# Patient Record
Sex: Male | Born: 1945
Health system: Southern US, Community
[De-identification: ages and names within clinical notes are randomized; demographics above are authoritative.]

## PROBLEM LIST (undated history)

## (undated) DIAGNOSIS — K922 Gastrointestinal hemorrhage, unspecified: Secondary | ICD-10-CM

## (undated) DIAGNOSIS — K259 Gastric ulcer, unspecified as acute or chronic, without hemorrhage or perforation: Secondary | ICD-10-CM

## (undated) DIAGNOSIS — D631 Anemia in chronic kidney disease: Secondary | ICD-10-CM

## (undated) DIAGNOSIS — R0989 Other specified symptoms and signs involving the circulatory and respiratory systems: Secondary | ICD-10-CM

## (undated) DIAGNOSIS — IMO0002 Reserved for concepts with insufficient information to code with codable children: Secondary | ICD-10-CM

## (undated) DIAGNOSIS — E538 Deficiency of other specified B group vitamins: Secondary | ICD-10-CM

## (undated) DIAGNOSIS — Q600 Renal agenesis, unilateral: Secondary | ICD-10-CM

## (undated) DIAGNOSIS — Z972 Presence of dental prosthetic device (complete) (partial): Secondary | ICD-10-CM

## (undated) DIAGNOSIS — B3781 Candidal esophagitis: Secondary | ICD-10-CM

## (undated) DIAGNOSIS — E119 Type 2 diabetes mellitus without complications: Secondary | ICD-10-CM

## (undated) DIAGNOSIS — K56699 Other intestinal obstruction unspecified as to partial versus complete obstruction: Secondary | ICD-10-CM

## (undated) DIAGNOSIS — Z7901 Long term (current) use of anticoagulants: Secondary | ICD-10-CM

## (undated) DIAGNOSIS — N189 Chronic kidney disease, unspecified: Secondary | ICD-10-CM

## (undated) DIAGNOSIS — E785 Hyperlipidemia, unspecified: Secondary | ICD-10-CM

## (undated) DIAGNOSIS — E7800 Pure hypercholesterolemia, unspecified: Secondary | ICD-10-CM

## (undated) DIAGNOSIS — N2581 Secondary hyperparathyroidism of renal origin: Secondary | ICD-10-CM

## (undated) DIAGNOSIS — Z9989 Dependence on other enabling machines and devices: Secondary | ICD-10-CM

## (undated) DIAGNOSIS — Z973 Presence of spectacles and contact lenses: Secondary | ICD-10-CM

## (undated) DIAGNOSIS — N400 Enlarged prostate without lower urinary tract symptoms: Secondary | ICD-10-CM

## (undated) DIAGNOSIS — K269 Duodenal ulcer, unspecified as acute or chronic, without hemorrhage or perforation: Secondary | ICD-10-CM

## (undated) DIAGNOSIS — G4733 Obstructive sleep apnea (adult) (pediatric): Secondary | ICD-10-CM

## (undated) DIAGNOSIS — J302 Other seasonal allergic rhinitis: Secondary | ICD-10-CM

## (undated) DIAGNOSIS — Z933 Colostomy status: Secondary | ICD-10-CM

## (undated) DIAGNOSIS — M1990 Unspecified osteoarthritis, unspecified site: Secondary | ICD-10-CM

## (undated) DIAGNOSIS — I714 Abdominal aortic aneurysm, without rupture, unspecified: Secondary | ICD-10-CM

## (undated) DIAGNOSIS — N183 Chronic kidney disease, stage 3 unspecified: Secondary | ICD-10-CM

## (undated) DIAGNOSIS — I10 Essential (primary) hypertension: Secondary | ICD-10-CM

## (undated) DIAGNOSIS — Z9049 Acquired absence of other specified parts of digestive tract: Secondary | ICD-10-CM

## (undated) DIAGNOSIS — I4821 Permanent atrial fibrillation: Secondary | ICD-10-CM

## (undated) HISTORY — DX: Pure hypercholesterolemia, unspecified: E78.00

## (undated) HISTORY — DX: Candidal esophagitis: B37.81

## (undated) HISTORY — DX: Gastrointestinal hemorrhage, unspecified: K92.2

## (undated) HISTORY — DX: Benign prostatic hyperplasia without lower urinary tract symptoms: N40.0

## (undated) HISTORY — DX: Essential (primary) hypertension: I10

## (undated) HISTORY — DX: Hyperlipidemia, unspecified: E78.5

## (undated) HISTORY — DX: Duodenal ulcer, unspecified as acute or chronic, without hemorrhage or perforation: K26.9

## (undated) HISTORY — PX: PAROTIDECTOMY: SHX2163

## (undated) HISTORY — DX: Other specified symptoms and signs involving the circulatory and respiratory systems: R09.89

## (undated) HISTORY — DX: Gastric ulcer, unspecified as acute or chronic, without hemorrhage or perforation: K25.9

## (undated) HISTORY — PX: INGUINAL HERNIA REPAIR: SUR1180

## (undated) HISTORY — DX: Other intestinal obstruction unspecified as to partial versus complete obstruction: K56.699

---

## 1999-07-12 ENCOUNTER — Ambulatory Visit (HOSPITAL_COMMUNITY): Admission: RE | Admit: 1999-07-12 | Discharge: 1999-07-12 | Payer: Self-pay | Admitting: Cardiology

## 1999-07-12 ENCOUNTER — Encounter: Payer: Self-pay | Admitting: Cardiology

## 1999-09-21 ENCOUNTER — Ambulatory Visit (HOSPITAL_BASED_OUTPATIENT_CLINIC_OR_DEPARTMENT_OTHER): Admission: RE | Admit: 1999-09-21 | Discharge: 1999-09-22 | Payer: Self-pay | Admitting: Otolaryngology

## 1999-09-21 ENCOUNTER — Encounter (INDEPENDENT_AMBULATORY_CARE_PROVIDER_SITE_OTHER): Payer: Self-pay

## 2003-06-03 ENCOUNTER — Ambulatory Visit (HOSPITAL_COMMUNITY): Admission: RE | Admit: 2003-06-03 | Discharge: 2003-06-03 | Payer: Self-pay | Admitting: *Deleted

## 2006-04-15 ENCOUNTER — Ambulatory Visit (HOSPITAL_COMMUNITY): Admission: RE | Admit: 2006-04-15 | Discharge: 2006-04-15 | Payer: Self-pay | Admitting: Internal Medicine

## 2006-12-11 ENCOUNTER — Encounter: Admission: RE | Admit: 2006-12-11 | Discharge: 2006-12-11 | Payer: Self-pay | Admitting: Internal Medicine

## 2010-12-08 NOTE — Op Note (Signed)
[place]  Patient:    Manuel [name], Manuel [name]                      MRN: [id number] Proc. Date: 09/21/99 Adm. Date:  [id number] Attending:  Rayfield Citizen CC:         Theodoro Parma. Francina Ames., M.D.                           Operative Report  PREOPERATIVE DIAGNOSIS:  Right parotid mass.  POSTOPERATIVE DIAGNOSIS:  Right parotid mass.  SURGICAL PROCEDURE:  Right parotidectomy with facial nerve preservation.  SURGEON:  Latricia Heft. Janace Hoard, M.D.  ANESTHESIA:  General endotracheal tube.  ESTIMATED BLOOD LOSS:  Approximately 25 cc.  INDICATIONS:  This is a 65 year old who has had a large mass in the right parotid that has slowly enlarged.  Has not caused him any numbness or pain.  It is very  bothersome to him, the fact that it is there, and he wants it removed.  He was informed of the risks and benefits of the procedure including bleeding, infection, facial nerve paralysis, Freys syndrome, salivary fistula, numbness of his ear and skin, scar, and risks of the anesthetic.  All questions were answered and consent was obtained.  DESCRIPTION OF PROCEDURE:  The patient was taken to the operating room and placed in the supine position.  After adequate general endotracheal tube anesthesia, was placed in the left gaze position.  Prepped and draped in the usual sterile manner. The facial nerve monitor was connected for monitoring of the nerve dissection. The incision was made in a modified Blair-type incision.  Dissection was carried down after raising a parotid flap anterior and posterior.  The dissection was taken own the cartilage to the tracheal pointer, and the tympanomeatal suture line was identified.  The tumor was very posterior and on the tail of the parotid, so the fascia was taken off the sternocleidomastoid.  The digastric muscle was dissected to identify it.  The facial nerve was finally identified in its normal anatomic  position.  The  trunk was followed and each branch was dissected and identified, and the tumor was brought up off of the inferior branches.  The mid branches of the  buccal area were rather very thready, and were followed and preserved.  The gland was divided about this level as the tumor so inferior, and this was taken off. The superior branch was not dissected, although it was identified where its location was.  The tumor was then removed.  The area was copiously irrigated.  The monitor was used to both identify the nerve with its stimulation, as well as careful dissection along the nerve.  The drain was placed inferiorly and secured with a  3-0 nylon.  The subcutaneous tissue was closed with interrupted 4-0 chromic and a running 6-0 nylon was used to close the skin.  The patient was awakened and brought to recovery in stable condition.  Counts correct. DD:  09/21/99 TD:  09/22/99 Job: [id number]

## 2010-12-08 NOTE — Op Note (Signed)
NAME:  GENERAL, WEARING                         ACCOUNT NO.:  [id number]   MEDICAL RECORD NO.:  [id number]                   PATIENT TYPE:  AMB   LOCATION:  ENDO                                 FACILITY:  [facility]   PHYSICIAN:  Waverly Ferrari, M.D.                 DATE OF BIRTH:  12/08/1945   DATE OF PROCEDURE:  06/03/2003  DATE OF DISCHARGE:                                 OPERATIVE REPORT   PROCEDURE:  Colonoscopy.   INDICATIONS:  Colon cancer screening and rectal bleeding.   ANESTHESIA:  Demerol 50, Versed 4 mg.   PROCEDURE:  The patient was mildly sedated in the left lateral decubitus  position. The Olympus videoscopic colonoscope was inserted in the rectum and  passed under direct vision to the cecum, identified by ileocecal valve and  appendiceal orifice, both of which were photographed. At this point, the  colonoscope was slowly withdrawn, taking circumferential views of the  colonic mucosa after we had also intubated the terminal ileum which also  appeared normal. The endoscope was withdrawn then all the way to the rectum,  stopping only to photograph rare diverticulum seen along the way. In the  rectum, the endoscope was placed in retroflexion to view the anal canal from  above. Endoscope was straightened and withdrawn. The patient's vital signs  and pulse oximetry remained stable. The patient tolerated the procedure well  without apparent complications.   FINDINGS:  Scattered diverticulosis of the colon, mild. Otherwise an  unremarkable examination including terminal ileum.   PLAN:  The patient will follow up with me in five years or as needed.                                               Waverly Ferrari, M.D.    GMO/MEDQ  D:  06/03/2003  T:  06/03/2003  Job:  [id number]

## 2011-04-06 ENCOUNTER — Encounter (INDEPENDENT_AMBULATORY_CARE_PROVIDER_SITE_OTHER): Payer: Self-pay | Admitting: Surgery

## 2011-04-09 ENCOUNTER — Encounter (INDEPENDENT_AMBULATORY_CARE_PROVIDER_SITE_OTHER): Payer: Self-pay | Admitting: Surgery

## 2011-04-10 ENCOUNTER — Ambulatory Visit (INDEPENDENT_AMBULATORY_CARE_PROVIDER_SITE_OTHER): Payer: Medicare Other | Admitting: Surgery

## 2011-04-10 ENCOUNTER — Encounter (INDEPENDENT_AMBULATORY_CARE_PROVIDER_SITE_OTHER): Payer: Self-pay | Admitting: Surgery

## 2011-04-10 VITALS — BP 136/78 | HR 60 | Temp 97.0°F | Resp 20 | Ht 74.5 in | Wt 232.2 lb

## 2011-04-10 DIAGNOSIS — R1032 Left lower quadrant pain: Secondary | ICD-10-CM | POA: Insufficient documentation

## 2011-04-10 LAB — BUN: BUN: 14 mg/dL (ref 6–23)

## 2011-04-10 LAB — CREATININE, SERUM: Creat: 1.23 mg/dL (ref 0.50–1.35)

## 2011-04-10 NOTE — Patient Instructions (Signed)
We will obtain an MRI of the pelvis to determine whether you have a muscle tear or possibly a very small recurrent hernia.  We will reevaluate you in two weeks.  In the meantime, use over-the-counter medications such as Ibuprofen or Aleve to help with the pain.  Avoid any heavy lifting or strenuous activity.

## 2011-04-10 NOTE — Progress Notes (Signed)
Addended by: Maryclare Bean on: 04/10/2011 01:08 PM   Modules accepted: Orders

## 2011-04-10 NOTE — Progress Notes (Signed)
Chief Complaint  Patient presents with  . Hernia    left groin pain    HPI Manuel [name] is a 65 y.o. male who is 15 years s/p left inguinal hernia repair with mesh by Dr. Rebekah Chesterfield.  Over the last few months, he has been experiencing increasing left groin pain, after some heavy lifting.  He does not feel a bulge in this area, but has significant pain with walking and prolonged standing.  He denies any obstructive symptoms.  He is referred for surgical evaluation. HPI  Past Medical History  Diagnosis Date  . Diabetes mellitus   . Hypertension   . BPH (benign prostatic hyperplasia)   . S/P colonoscopy     Past Surgical History  Procedure Date  . Hernia repair   . 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 Cystostomy     off back    Family History  Problem Relation Age of Onset  . Cancer Father     Social History History  Substance Use Topics  . Smoking status: Former Research scientist (life sciences)  . Smokeless tobacco: Not on file  . Alcohol Use: No    Allergies  Allergen Reactions  . Sulfa Antibiotics     Current Outpatient Prescriptions  Medication Sig Dispense Refill  . aspirin 81 MG tablet Take 81 mg by mouth daily.        Marland Kitchen diltiazem (CARDIZEM) 120 MG tablet Take 120 mg by mouth 4 (four) times daily.        Marland Kitchen doxazosin (CARDURA) 8 MG tablet Take 8 mg by mouth at bedtime.        Marland Kitchen lisinopril (PRINIVIL,ZESTRIL) 10 MG tablet Take 10 mg by mouth daily.        . metFORMIN (GLUMETZA) 500 MG (MOD) 24 hr tablet Take 500 mg by mouth daily with breakfast.          Review of Systems Review of Systems Reviewed with patient - essentially positive only for arthritis pains. Blood pressure 136/78, pulse 60, temperature 97 F (36.1 C), temperature source Temporal, resp. rate 20, height 6' 2.5" (1.892 m), weight 232 lb 3.2 oz (105.325 kg).  Physical Exam Physical Exam WDWN in NAD HEENT:  EOMI, sclera anicteric Neck:  No masses, no thyromegaly Lungs:  CTA bilaterally; normal  respiratory effort CV:  Regular rate and rhythm; no murmurs Abd:  +bowel sounds, soft, non-tender, no masses GU:  Bilateral descended testicles- no testicular masses.  Healed left inguinal incision.  No palpable inguinal hernia in either groin.  No obvious bulge in left groin.  The area of tenderness seems to be located laterally. Ext:  Well-perfused; no edema Skin:  Warm, dry; no sign of jaundice  Data Reviewed   Assessment    Left groin pain - possible muscle tear, but no obvious recurrent hernia on physical examination.    Plan    Rest, NSAIDS; we will obtain an MRI to evaluate for possible muscle tear vs. Small recurrent hernia.  Reevaluate in 2 weeks.       Manuel Kang K. 04/10/2011, 11:59 AM

## 2011-04-18 ENCOUNTER — Inpatient Hospital Stay (HOSPITAL_COMMUNITY): Admission: RE | Admit: 2011-04-18 | Payer: Self-pay | Source: Ambulatory Visit

## 2011-04-24 ENCOUNTER — Encounter (INDEPENDENT_AMBULATORY_CARE_PROVIDER_SITE_OTHER): Payer: Self-pay | Admitting: Surgery

## 2011-04-26 ENCOUNTER — Encounter (INDEPENDENT_AMBULATORY_CARE_PROVIDER_SITE_OTHER): Payer: Self-pay | Admitting: Surgery

## 2011-04-26 ENCOUNTER — Ambulatory Visit (INDEPENDENT_AMBULATORY_CARE_PROVIDER_SITE_OTHER): Payer: Medicare Other | Admitting: Surgery

## 2011-04-26 VITALS — BP 126/80 | HR 64 | Temp 97.4°F | Resp 14 | Ht 74.5 in | Wt 231.4 lb

## 2011-04-26 DIAGNOSIS — R1032 Left lower quadrant pain: Secondary | ICD-10-CM

## 2011-04-26 NOTE — Patient Instructions (Signed)
Limit any lifting to 20 lbs or less.  Use NSAID's, such as Aleve, Ibuprofen, or Naprosyn for the next few weeks.  No strenuous activity.  Call us if no improvement in one month.

## 2011-04-26 NOTE — Progress Notes (Signed)
Chief Complaint  Patient presents with  . Follow-up    HPI Manuel [name] is a 65 y.o. male who is 15 years s/p left inguinal hernia repair with mesh by Dr. Rebekah Chesterfield.  Over the last few months, he has been experiencing increasing left groin pain, after some heavy lifting.  He does not feel a bulge in this area, but has significant pain with walking and prolonged standing.  He denies any obstructive symptoms.  He is referred for surgical evaluation. HPI  Past Medical History  Diagnosis Date  . Diabetes mellitus   . Hypertension   . BPH (benign prostatic hyperplasia)   . S/P colonoscopy     Past Surgical History  Procedure Date  . Hernia repair   . 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 Cystostomy     off back    Family History  Problem Relation Age of Onset  . Cancer Father   . Kidney disease Paternal Aunt     Social History History  Substance Use Topics  . Smoking status: Former Research scientist (life sciences)  . Smokeless tobacco: Not on file  . Alcohol Use: No    Allergies  Allergen Reactions  . Sulfa Antibiotics     Current Outpatient Prescriptions  Medication Sig Dispense Refill  . aspirin 81 MG tablet Take 81 mg by mouth daily.        Marland Kitchen diltiazem (CARDIZEM) 120 MG tablet Take 120 mg by mouth 4 (four) times daily.        Marland Kitchen doxazosin (CARDURA) 8 MG tablet Take 8 mg by mouth at bedtime.        Marland Kitchen lisinopril (PRINIVIL,ZESTRIL) 10 MG tablet Take 10 mg by mouth daily.        . metFORMIN (GLUMETZA) 500 MG (MOD) 24 hr tablet Take 500 mg by mouth daily with breakfast.          Review of Systems Review of Systems Reviewed with patient - essentially positive only for arthritis pains. Blood pressure 126/80, pulse 64, temperature 97.4 F (36.3 C), temperature source Temporal, resp. rate 14, height 6' 2.5" (1.892 m), weight 231 lb 6.4 oz (104.962 kg).  Physical Exam Physical Exam WDWN in NAD HEENT:  EOMI, sclera anicteric Neck:  No masses, no thyromegaly Lungs:  CTA  bilaterally; normal respiratory effort CV:  Regular rate and rhythm; no murmurs Abd:  +bowel sounds, soft, non-tender, no masses GU:  Bilateral descended testicles- no testicular masses.  Healed left inguinal incision.  No palpable inguinal hernia in either groin.  No obvious bulge in left groin.  The area of tenderness seems to be located laterally. Ext:  Well-perfused; no edema Skin:  Warm, dry; no sign of jaundice  Data Reviewed   Assessment    Left groin pain - possible muscle tear, but no obvious recurrent hernia on physical examination.    Plan    Rest, NSAIDS; we will obtain an MRI to evaluate for possible muscle tear vs. Small recurrent hernia.  Reevaluate in 2 weeks.       Manuel Llerena K. 04/26/2011, 9:34 AM  Current note:  Medicare denied payment for the MRI.  The patient has been limiting his level of activity and using NSAID's, with some noticeable improvement.  He still has some tenderness laterally.  He does not have a palpable hernia on either side when standing with Valsalva maneuver.  Imp: left groin muscle strain, but no sign of hernia  Plan:  Continue NSAID's and  limiting activity.  If no improvement in one month, he will call us back and we will appeal the MRI coverage.

## 2011-10-31 ENCOUNTER — Ambulatory Visit
Admission: RE | Admit: 2011-10-31 | Discharge: 2011-10-31 | Disposition: A | Discharge status: Home / Self Care | Payer: Self-pay | Source: Ambulatory Visit | Attending: Internal Medicine | Admitting: Internal Medicine

## 2011-10-31 ENCOUNTER — Other Ambulatory Visit: Payer: Self-pay | Admitting: Internal Medicine

## 2011-10-31 DIAGNOSIS — R42 Dizziness and giddiness: Secondary | ICD-10-CM

## 2011-11-16 HISTORY — PX: CARDIOVASCULAR STRESS TEST: SHX262

## 2011-12-06 ENCOUNTER — Encounter (HOSPITAL_COMMUNITY): Payer: Self-pay | Admitting: Pharmacy Technician

## 2011-12-18 ENCOUNTER — Encounter (HOSPITAL_COMMUNITY): Payer: Self-pay | Admitting: Anesthesiology

## 2011-12-18 ENCOUNTER — Ambulatory Visit (HOSPITAL_COMMUNITY): Payer: Medicare Other | Admitting: Anesthesiology

## 2011-12-18 ENCOUNTER — Ambulatory Visit (HOSPITAL_COMMUNITY)
Admission: RE | Admit: 2011-12-18 | Discharge: 2011-12-18 | Disposition: A | Discharge status: Home / Self Care | Payer: Medicare Other | Source: Ambulatory Visit | Attending: Cardiology | Admitting: Cardiology

## 2011-12-18 ENCOUNTER — Encounter (HOSPITAL_COMMUNITY)
Admission: RE | Disposition: A | Discharge status: Home / Self Care | Payer: Self-pay | Source: Ambulatory Visit | Attending: Cardiology

## 2011-12-18 ENCOUNTER — Other Ambulatory Visit: Payer: Self-pay

## 2011-12-18 DIAGNOSIS — E119 Type 2 diabetes mellitus without complications: Secondary | ICD-10-CM | POA: Insufficient documentation

## 2011-12-18 DIAGNOSIS — I4891 Unspecified atrial fibrillation: Secondary | ICD-10-CM | POA: Insufficient documentation

## 2011-12-18 DIAGNOSIS — I10 Essential (primary) hypertension: Secondary | ICD-10-CM | POA: Insufficient documentation

## 2011-12-18 DIAGNOSIS — G4733 Obstructive sleep apnea (adult) (pediatric): Secondary | ICD-10-CM | POA: Insufficient documentation

## 2011-12-18 DIAGNOSIS — E785 Hyperlipidemia, unspecified: Secondary | ICD-10-CM | POA: Insufficient documentation

## 2011-12-18 HISTORY — PX: CARDIOVERSION: SHX1299

## 2011-12-18 LAB — GLUCOSE, CAPILLARY: Glucose-Capillary: 97 mg/dL (ref 70–99)

## 2011-12-18 SURGERY — CARDIOVERSION
Anesthesia: General | Wound class: Clean

## 2011-12-18 MED ORDER — SODIUM CHLORIDE 0.9 % IV SOLN: 250.0000 mL | INTRAVENOUS | Status: DC

## 2011-12-18 MED ORDER — LACTATED RINGERS IV SOLN
INTRAVENOUS | Status: DC | PRN
  Administered 2011-12-18: 10:00:00 via INTRAVENOUS

## 2011-12-18 MED ORDER — PROPOFOL 10 MG/ML IV EMUL
INTRAVENOUS | Status: DC | PRN
  Administered 2011-12-18: 100 mg via INTRAVENOUS

## 2011-12-18 MED ORDER — HYDROCORTISONE 1 % EX CREA
1.0000 "application " | TOPICAL_CREAM | Freq: Three times a day (TID) | CUTANEOUS | Status: DC | PRN
  Filled 2011-12-18: qty 28

## 2011-12-18 MED ORDER — SODIUM CHLORIDE 0.9 % IJ SOLN: 3.0000 mL | Freq: Two times a day (BID) | INTRAMUSCULAR | Status: DC

## 2011-12-18 MED ORDER — SODIUM CHLORIDE 0.9 % IJ SOLN: 3.0000 mL | INTRAMUSCULAR | Status: DC | PRN

## 2011-12-18 NOTE — Interval H&P Note (Signed)
History and Physical Interval Note:  12/18/2011 10:21 AM  Manuel [name]  has presented today for surgery, with the diagnosis of AFIB  The various methods of treatment have been discussed with the patient and family. After consideration of risks, benefits and other options for treatment, the patient has consented to  Procedure(s) (LRB): CARDIOVERSION (N/A) as a surgical intervention .  The patients' history has been reviewed, patient examined, no change in status, stable for surgery.  I have reviewed the patients' chart and labs.  Questions were answered to the patient's satisfaction.     Laverda Page

## 2011-12-18 NOTE — CV Procedure (Signed)
Direct current cardioversion: Indication symptomatic A. Fibrillation. Procedure: Using 100 mg of IV Propofol for achieving deep (Moderate sedation), synchronized direct current cardioversion performed. Patient was delivered with 120, 150 x 3 Joules of electricity with success to NSR. Patient tolerated the procedure well. No immediate complication noted.

## 2011-12-18 NOTE — Preoperative (Signed)
Beta Blockers   Reason not to administer Beta Blockers:Not Applicable 

## 2011-12-18 NOTE — Anesthesia Postprocedure Evaluation (Signed)
  Anesthesia Post-op Note  Patient: Manuel [name]  Procedure(s) Performed: Procedure(s) (LRB): CARDIOVERSION (N/A)  Patient Location: PACU  Anesthesia Type: general  Level of Consciousness: awake and alert   Airway and Oxygen Therapy: Patient Spontanous Breathing  Post-op Pain: mild  Post-op Assessment: Post-op Vital signs reviewed, Patient's Cardiovascular Status Stable, Respiratory Function Stable, Patent Airway and No signs of Nausea or vomiting  Post-op Vital Signs: stable  Complications: No apparent anesthesia complications

## 2011-12-18 NOTE — Discharge Instructions (Signed)
Electrical Cardioversion Cardioversion is the delivery of a jolt of electricity to change the rhythm of the heart. Sticky patches or metal paddles are placed on the chest to deliver the electricity from a special device. This is done to restore a normal rhythm. A rhythm that is too fast or not regular keeps the heart from pumping well. Compared to medicines used to change an abnormal rhythm, cardioversion is faster and works better. It is also unpleasant and may dislodge blood clots from the heart. WHEN WOULD THIS BE DONE?  In an emergency:   There is low or no blood pressure as a result of the heart rhythm.   Normal rhythm must be restored as fast as possible to protect the brain and heart from further damage.   It may save a life.   For less serious heart rhythms, such as atrial fibrillation or flutter, in which:   The heart is beating too fast or is not regular.   The heart is still able to pump enough blood, but not as well as it should.   Medicine to change the rhythm has not worked.   It is safe to wait in order to allow time for preparation.  LET YOUR CAREGIVER KNOW ABOUT:   Every medicine you are taking. It is very important to do this! Know when to take or stop taking any of them.   Any time in the past that you have felt your heart was not beating normally.  RISKS AND COMPLICATIONS   Clots may form in the chambers of the heart if it is beating too fast. These clots may be dislodged during the procedure and travel to other parts of the body.   There is risk of a stroke during and after the procedure if a clot moves. Blood thinners lower this risk.   You may have a special test of your heart (TEE) to make sure there are no clots in your heart.  BEFORE THE PROCEDURE   You may have some tests to see how well your heart is working.   You may start taking blood thinners so your blood does not clot as easily.   Other drugs may be given to help your heart work better.   PROCEDURE (SCHEDULED)  The procedure is typically done in a hospital by a heart doctor (cardiologist).   You will be told when and where to go.   You may be given some medicine through an intravenous (IV) access to reduce discomfort and make you sleepy before the procedure.   Your whole body may move when the shock is delivered. Your chest may feel sore.   You may be able to go home after a few hours. Your heart rhythm will be watched to make sure it does not change.  HOME CARE INSTRUCTIONS   Only take medicine as directed by your caregiver. Be sure you understand how and when to take your medicine.   Learn how to feel your pulse and check it often.   Limit your activity for 48 hours.   Avoid caffeine and other stimulants as directed.  SEEK MEDICAL CARE IF:   You feel like your heart is beating too fast or your pulse is not regular.   You have any questions about your medicines.   You have bleeding that will not stop.  SEEK IMMEDIATE MEDICAL CARE IF:   You are dizzy or feel faint.   It is hard to breathe or you feel short of breath.  There is a change in discomfort in your chest.   Your speech is slurred or you have trouble moving your arm or leg on one side.   You get a muscle cramp.   Your fingers or toes turn cold or blue.  MAKE SURE YOU:   Understand these instructions.   Will watch your condition.   Will get help right away if you are not doing well or get worse.  Document Released: 06/29/2002 Document Revised: 06/28/2011 Document Reviewed: 10/29/2007 [facility] Patient Information 2012 [place].

## 2011-12-18 NOTE — Anesthesia Preprocedure Evaluation (Addendum)
Anesthesia Evaluation  Patient identified by MRN, date of birth, ID band Patient awake    Reviewed: Allergy & Precautions, H&P , NPO status , Patient's Chart, lab work & pertinent test results  Airway Mallampati: II TM Distance: >3 FB Neck ROM: Full    Dental No notable dental hx. (+) Dental Advidsory Given   Pulmonary neg pulmonary ROS,  breath sounds clear to auscultation  Pulmonary exam normal       Cardiovascular hypertension, Pt. on medications + dysrhythmias Atrial Fibrillation Rhythm:Irregular Rate:Abnormal     Neuro/Psych negative neurological ROS  negative psych ROS   GI/Hepatic negative GI ROS, Neg liver ROS,   Endo/Other  Diabetes mellitus-, Type 2  Renal/GU negative Renal ROS  negative genitourinary   Musculoskeletal negative musculoskeletal ROS (+)   Abdominal   Peds negative pediatric ROS (+)  Hematology negative hematology ROS (+)   Anesthesia Other Findings   Reproductive/Obstetrics negative OB ROS                         Anesthesia Physical Anesthesia Plan  ASA: III  Anesthesia Plan: General   Post-op Pain Management:    Induction: Intravenous  Airway Management Planned: Mask  Additional Equipment:   Intra-op Plan:   Post-operative Plan:   Informed Consent: I have reviewed the patients History and Physical, chart, labs and discussed the procedure including the risks, benefits and alternatives for the proposed anesthesia with the patient or authorized representative who has indicated his/her understanding and acceptance.   Dental advisory given and Dental Advisory Given  Plan Discussed with: CRNA, Anesthesiologist and Surgeon  Anesthesia Plan Comments:        Anesthesia Quick Evaluation

## 2011-12-18 NOTE — Transfer of Care (Signed)
Immediate Anesthesia Transfer of Care Note  Patient: Manuel [name]  Procedure(s) Performed: Procedure(s) (LRB): CARDIOVERSION (N/A)  Patient Location: Short Stay  Anesthesia Type: MAC  Level of Consciousness: oriented and sedated  Airway & Oxygen Therapy: Patient Spontanous Breathing and Patient connected to nasal cannula oxygen  Post-op Assessment: Report given to PACU RN  Post vital signs: Reviewed and stable  Complications: No apparent anesthesia complications

## 2011-12-18 NOTE — Anesthesia Postprocedure Evaluation (Signed)
  Anesthesia Post-op Note  Patient: Manuel [name]  Procedure(s) Performed: Procedure(s) (LRB): CARDIOVERSION (N/A)  Patient Location: Short Stay  Anesthesia Type: MAC  Level of Consciousness: awake, alert  and oriented  Airway and Oxygen Therapy: Patient Spontanous Breathing and Patient connected to nasal cannula oxygen  Post-op Pain: none  Post-op Assessment: Post-op Vital signs reviewed, Patient's Cardiovascular Status Stable, Respiratory Function Stable, Patent Airway, No signs of Nausea or vomiting and Adequate PO intake  Post-op Vital Signs: Reviewed and stable  Complications: No apparent anesthesia complications

## 2011-12-18 NOTE — H&P (Signed)
Please see paper chart

## 2011-12-20 ENCOUNTER — Encounter (HOSPITAL_COMMUNITY): Payer: Self-pay | Admitting: Cardiology

## 2013-07-23 HISTORY — PX: CATARACT EXTRACTION W/ INTRAOCULAR LENS IMPLANT: SHX1309

## 2015-03-24 ENCOUNTER — Encounter: Payer: Self-pay | Admitting: Neurology

## 2015-03-24 ENCOUNTER — Ambulatory Visit (INDEPENDENT_AMBULATORY_CARE_PROVIDER_SITE_OTHER): Payer: Medicare HMO | Admitting: Neurology

## 2015-03-24 VITALS — BP 128/80 | HR 72 | Resp 16 | Ht 74.5 in | Wt 234.0 lb

## 2015-03-24 DIAGNOSIS — I480 Paroxysmal atrial fibrillation: Secondary | ICD-10-CM | POA: Diagnosis not present

## 2015-03-24 DIAGNOSIS — G4733 Obstructive sleep apnea (adult) (pediatric): Secondary | ICD-10-CM | POA: Diagnosis not present

## 2015-03-24 DIAGNOSIS — G4719 Other hypersomnia: Secondary | ICD-10-CM | POA: Diagnosis not present

## 2015-03-24 DIAGNOSIS — E663 Overweight: Secondary | ICD-10-CM

## 2015-03-24 DIAGNOSIS — R351 Nocturia: Secondary | ICD-10-CM | POA: Diagnosis not present

## 2015-03-24 DIAGNOSIS — M7989 Other specified soft tissue disorders: Secondary | ICD-10-CM | POA: Diagnosis not present

## 2015-03-24 NOTE — Progress Notes (Signed)
Subjective:    Patient ID: Manuel [name] is a 69 y.o. male.  HPI     Star Age, MD, PhD [facility] [address] [place], [place] [address]  Dear Ulice Dash,   I saw your patient, Manuel [name], upon your kind request in my neurologic clinic today for initial consultation of his sleep disorder, in particular, concern for underlying obstructive sleep apnea. The patient is unaccompanied today. As you know, Manuel [name] is a 69 year old right-handed gentleman with an underlying medical history of hypertension, type 2 diabetes, hyperlipidemia, left carotid bruit, arthritis, paroxysmal atrial fibrillation, status post cardioversion on 12/18/2011 and obesity, who previously was diagnosed with moderate to severe obstructive sleep apnea based on your office note. He had a sleep study according to your records on 11/29/2011. I reviewed your office note from 02/17/2015 which you kindly included. Previous test results were reviewed: Carotid Doppler study from 05/15/2012 showed minimal stenosis of the right proximal common carotid artery, minimal stenosis of the left proximal common carotid artery. Echocardiogram from 11/08/2011 showed normal left ventricular EF, moderate LVH, moderate to severe left atrial enlargement, trace MR, trace TR, no pulmonary hypertension.  Previous sleep study results are not available for my review. He had his sleep study done at the [facility]. He reports restless sleep, snoring and excessive daytime somnolence. His ESS is 13/24 today. He complaints of not sleeping well, feeling exhausted. His fatigue score is 39/63. His bedtime is around 10 PM. He has to use the bathroom 2-3 times per night on average. He denies morning headaches and a FHx of OSA. His diabetes is under good control, per patient.  He reports a bedtime of around 10 PM. He has nocturia 2-3 times per night. His rise time is between 6 and 6:15 AM. He does not drink  alcohol, he quit smoking, he does admit that he had some relapse with his smoking and most recently quit 3 months ago. He drinks caffeine in the form of coffee, 2-3 per day. He denies any significant restless leg symptoms. His Epworth sleepiness score is 13 out of 24, his fatigue score is 39 out of 63. He does not wake up rested.  His Past Medical History Is Significant For: Past Medical History  Diagnosis Date  . Diabetes mellitus   . Hypertension   . BPH (benign prostatic hyperplasia)   . S/P colonoscopy   . A-fib   . Left carotid bruit   . OSA (obstructive sleep apnea)   . Dizziness and giddiness   . Dyslipidemia     His Past Surgical History Is Significant For: Past Surgical History  Procedure Laterality Date  . Hernia repair    . 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 Cystostomy      off back  . Cardioversion  12/18/2011    Procedure: CARDIOVERSION;  Surgeon: Laverda Page, MD;  Location: [facility] OR;  Service: Cardiovascular;  Laterality: N/A;    His Family History Is Significant For: Family History  Problem Relation Age of Onset  . Cancer Father   . Alzheimer's disease Father   . Kidney disease Paternal Aunt   . Cancer Mother     His Social History Is Significant For: Social History   Social History  . Marital Status: Married    Spouse Name: N/A  . Number of Children: 1  . Years of Education: N/A   Occupational History  .  [employer]   Social History Main Topics  . Smoking status: Former Research scientist (life sciences)  . Smokeless tobacco: Not on file     Comment: 12/2014  . Alcohol Use: No  . Drug Use: No  . Sexual Activity: Not on file   Other Topics Concern  . Not on file   Social History Narrative   Drinks 2-3 caffeine drinks a day     His Allergies Are:  Allergies  Allergen Reactions  . Sulfa Antibiotics Other (See Comments)    Headaches   :   His Current Medications Are:  Outpatient Encounter Prescriptions as of 03/24/2015   Medication Sig  . diltiazem (CARDIZEM) 120 MG tablet Take 120 mg by mouth daily.   Marland Kitchen doxazosin (CARDURA) 8 MG tablet Take 8 mg by mouth daily.  Marland Kitchen edoxaban (SAVAYSA) 60 MG TABS tablet Take by mouth daily.  Marland Kitchen glimepiride (AMARYL) 1 MG tablet Take 1 mg by mouth daily with breakfast.  . lisinopril (PRINIVIL,ZESTRIL) 10 MG tablet Take 10 mg by mouth daily.   Marland Kitchen losartan (COZAAR) 25 MG tablet Take 25 mg by mouth daily.  . metFORMIN (GLUMETZA) 500 MG (MOD) 24 hr tablet Take 500 mg by mouth 2 (two) times daily with a meal.   . pravastatin (PRAVACHOL) 10 MG tablet Take 20 mg by mouth daily.   . [DISCONTINUED] CARTIA XT 120 MG 24 hr capsule TK 1 C PO BID  . [DISCONTINUED] finasteride (PROSCAR) 5 MG tablet Take 5 mg by mouth daily.   No facility-administered encounter medications on file as of 03/24/2015.  :  Review of Systems:  Out of a complete 14 point review of systems, all are reviewed and negative with the exception of these symptoms as listed below:   Review of Systems  Constitutional: Positive for fatigue.  HENT: Positive for hearing loss.   Eyes:       Blurred vision   Gastrointestinal: Positive for diarrhea.  Genitourinary: Positive for difficulty urinating.       Impotence   Musculoskeletal:       Cramps  Neurological: Positive for dizziness.       No trouble falling asleep, trouble staying asleep, snoring, sometimes takes naps, wakes up in the morning feeling tired, no energy during day, states that he paces his activities during the day.   Psychiatric/Behavioral:       Not enough sleep     Objective:  Neurologic Exam  Physical Exam Physical Examination:   Filed Vitals:   03/24/15 0932  BP: 128/80  Pulse: 72  Resp: 16    General Examination: The patient is a very pleasant 69 y.o. male in no acute distress. He appears well-developed and well-nourished and well groomed.   HEENT: Normocephalic, atraumatic, pupils are equal, round and reactive to light and accommodation.  Funduscopic exam is normal with sharp disc margins noted. Extraocular tracking is good without limitation to gaze excursion or nystagmus noted. Normal smooth pursuit is noted. Hearing is grossly intact. Face is symmetric with normal facial animation and normal facial sensation. Speech is clear with no dysarthria noted. There is no hypophonia. There is no lip, neck/head, jaw or voice tremor. Neck is supple with full range of passive and active motion. There are no carotid bruits on auscultation. Oropharynx exam reveals: mild mouth dryness, adequate dental hygiene and moderate airway crowding, due to thick soft palate, redundant soft palate, large uvula, larger tongue and tonsils in place. Mallampati is class III. Tongue protrudes centrally and  palate elevates symmetrically. Tonsils are 1+ in size. Neck circumference is 17 inches.   Chest: Clear to auscultation without wheezing, rhonchi or crackles noted.  Heart: S1+S2+0, irregularly irregular without murmurs, rubs or gallops noted.   Abdomen: Soft, non-tender and non-distended with normal bowel sounds appreciated on auscultation.  Extremities: There is trace pitting edema in the distal lower extremities bilaterally. Pedal pulses are intact.  Skin: Warm and dry without trophic changes noted. There are no varicose veins.  Musculoskeletal: exam reveals no obvious joint deformities, tenderness or joint swelling or erythema.   Neurologically:  Mental status: The patient is awake, alert and oriented in all 4 spheres. His immediate and remote memory, attention, language skills and fund of knowledge are appropriate. There is no evidence of aphasia, agnosia, apraxia or anomia. Speech is clear with normal prosody and enunciation. Thought process is linear. Mood is normal and affect is normal.  Cranial nerves II - XII are as described above under HEENT exam. In addition: shoulder shrug is normal with equal shoulder height noted. Motor exam: Normal bulk,  strength and tone is noted. There is no drift, tremor or rebound. Romberg is negative. Reflexes are 2+ throughout. Babinski: Toes are flexor bilaterally. Fine motor skills and coordination: intact with normal finger taps, normal hand movements, normal rapid alternating patting, normal foot taps and normal foot agility.  Cerebellar testing: No dysmetria or intention tremor on finger to nose testing. Heel to shin is unremarkable bilaterally. There is no truncal or gait ataxia.  Sensory exam: intact to light touch, pinprick, vibration, temperature sense in the upper and lower extremities.  Gait, station and balance: He stands easily. No veering to one side is noted. No leaning to one side is noted. Posture is age-appropriate and stance is narrow based. Gait shows normal stride length and normal pace. No problems turning are noted. He turns en bloc. Tandem walk is slightly difficult for him.   Assessment and Plan:   In summary, ESPN ZEMAN is a very pleasant 69 y.o.-year old male with a history and physical exam concerning for obstructive sleep apnea (OSA). I had a long chat with the patient about my findings and the diagnosis of OSA, its prognosis and treatment options. We talked about medical treatments, surgical interventions and non-pharmacological approaches. I explained in particular the risks and ramifications of untreated moderate to severe OSA, especially with respect to developing cardiovascular disease down the Road, including congestive heart failure, difficult to treat hypertension, cardiac arrhythmias, or stroke. Even type 2 diabetes has, in part, been linked to untreated OSA. Symptoms of untreated OSA include daytime sleepiness, memory problems, mood irritability and mood disorder such as depression and anxiety, lack of energy, as well as recurrent headaches, especially morning headaches. We talked about smoking cessation and trying to maintain a healthy lifestyle in general, as well as the  importance of weight control. I encouraged the patient to eat healthy, exercise daily and keep well hydrated, to keep a scheduled bedtime and wake time routine, to not skip any meals and eat healthy snacks in between meals. I advised the patient not to drive when feeling sleepy. I recommended the following at this time: sleep study with potential positive airway pressure titration. (We will score hypopneas at 4% and split the sleep study into diagnostic and treatment portion, if the estimated. 2 hour AHI is >15/h).   I explained the sleep test procedure to the patient and also outlined possible surgical and non-surgical treatment options of OSA, including the  use of a custom-made dental device (which would require a referral to a specialist dentist or oral surgeon), upper airway surgical options, such as pillar implants, radiofrequency surgery, tongue base surgery, and UPPP (which would involve a referral to an ENT surgeon). Rarely, jaw surgery such as mandibular advancement may be considered.  I also explained the CPAP treatment option to the patient, who indicated that he would be willing to try CPAP if the need arises. I explained the importance of being compliant with PAP treatment, not only for insurance purposes but primarily to improve His symptoms, and for the patient's long term health benefit, including to reduce His cardiovascular risks. I answered all his questions today and the patient was in agreement. I would like to see him back after the sleep study is completed and encouraged him to call with any interim questions, concerns, problems or updates.   Thank you very much for allowing me to participate in the care of this nice patient. If I can be of any further assistance to you please do not hesitate to call me at [phone].  Sincerely,   Star Age, MD, PhD

## 2015-03-24 NOTE — Patient Instructions (Signed)

## 2015-04-14 ENCOUNTER — Telehealth: Payer: Self-pay | Admitting: Neurology

## 2015-04-14 NOTE — Telephone Encounter (Signed)
Patient called to advise he received a call from his insurance company regarding sleep study. Was advised they won't cover this because there are no symptoms or medical conditions to support a sleep study. They suggested a possible home sleep study. Please call patient regarding this. [phone].

## 2015-04-27 DIAGNOSIS — I10 Essential (primary) hypertension: Secondary | ICD-10-CM | POA: Diagnosis not present

## 2015-04-27 DIAGNOSIS — E119 Type 2 diabetes mellitus without complications: Secondary | ICD-10-CM | POA: Diagnosis not present

## 2015-04-27 DIAGNOSIS — E118 Type 2 diabetes mellitus with unspecified complications: Secondary | ICD-10-CM | POA: Diagnosis not present

## 2015-05-02 DIAGNOSIS — I480 Paroxysmal atrial fibrillation: Secondary | ICD-10-CM | POA: Diagnosis not present

## 2015-05-02 DIAGNOSIS — I10 Essential (primary) hypertension: Secondary | ICD-10-CM | POA: Diagnosis not present

## 2015-05-02 DIAGNOSIS — E119 Type 2 diabetes mellitus without complications: Secondary | ICD-10-CM | POA: Diagnosis not present

## 2015-05-02 DIAGNOSIS — E7800 Pure hypercholesterolemia, unspecified: Secondary | ICD-10-CM | POA: Diagnosis not present

## 2015-05-06 ENCOUNTER — Telehealth: Payer: Self-pay | Admitting: Neurology

## 2015-05-06 DIAGNOSIS — G4733 Obstructive sleep apnea (adult) (pediatric): Secondary | ICD-10-CM

## 2015-05-06 DIAGNOSIS — G4719 Other hypersomnia: Secondary | ICD-10-CM

## 2015-05-06 DIAGNOSIS — E663 Overweight: Secondary | ICD-10-CM

## 2015-05-06 DIAGNOSIS — I480 Paroxysmal atrial fibrillation: Secondary | ICD-10-CM

## 2015-05-06 DIAGNOSIS — R351 Nocturia: Secondary | ICD-10-CM

## 2015-05-06 NOTE — Telephone Encounter (Signed)
[employer] denied split sleep test.  Can I get an order for HST?

## 2015-05-09 NOTE — Telephone Encounter (Signed)
Home sleep test placed.

## 2015-05-12 ENCOUNTER — Encounter (INDEPENDENT_AMBULATORY_CARE_PROVIDER_SITE_OTHER): Payer: Medicare HMO | Admitting: Neurology

## 2015-05-12 DIAGNOSIS — G4733 Obstructive sleep apnea (adult) (pediatric): Secondary | ICD-10-CM

## 2015-05-12 DIAGNOSIS — E663 Overweight: Secondary | ICD-10-CM

## 2015-05-12 DIAGNOSIS — R351 Nocturia: Secondary | ICD-10-CM

## 2015-05-12 DIAGNOSIS — I480 Paroxysmal atrial fibrillation: Secondary | ICD-10-CM

## 2015-05-12 DIAGNOSIS — G4719 Other hypersomnia: Secondary | ICD-10-CM

## 2015-05-20 ENCOUNTER — Telehealth: Payer: Self-pay | Admitting: Neurology

## 2015-05-20 DIAGNOSIS — G4733 Obstructive sleep apnea (adult) (pediatric): Secondary | ICD-10-CM

## 2015-05-20 DIAGNOSIS — G4734 Idiopathic sleep related nonobstructive alveolar hypoventilation: Secondary | ICD-10-CM

## 2015-05-20 NOTE — Telephone Encounter (Signed)
Patient referred by Dr. Einar Gip, seen by me on 03/24/15, HST on 05/12/15:  Please call and notify the patient that the recent home sleep test did suggest the diagnosis of severe obstructive sleep apnea and that I recommend treatment for this in the form of CPAP. I will request an overnight sleep study for proper titration and mask fitting. Please explain to patient and arrange for a CPAP titration study. I have placed an order in the chart. Thanks, and please route to [facility] for scheduling.   Star Age, MD, PhD [facility])

## 2015-05-23 NOTE — Telephone Encounter (Signed)
Left message to call back for sleep study results.

## 2015-05-24 NOTE — Telephone Encounter (Signed)
I left message on vm. Gave results and recommendations per patient request. I advised that Dawn will call him to schedule titration study. Left call back number for further questions.

## 2015-05-24 NOTE — Telephone Encounter (Signed)
Pt called returning Diana's call. Pt sts he is at a convention, phone will be cut down. Can leave VM. If he does not hear back he will call on Thursday when he gets back in town.

## 2015-05-26 NOTE — Telephone Encounter (Signed)
Patient called back to clarify results. He would like to proceed with titration study. I advised him that Arrie Aran will call to schedule.

## 2015-06-13 DIAGNOSIS — Z23 Encounter for immunization: Secondary | ICD-10-CM | POA: Diagnosis not present

## 2015-07-03 ENCOUNTER — Ambulatory Visit (INDEPENDENT_AMBULATORY_CARE_PROVIDER_SITE_OTHER): Payer: Medicare HMO | Admitting: Neurology

## 2015-07-03 DIAGNOSIS — G4733 Obstructive sleep apnea (adult) (pediatric): Secondary | ICD-10-CM | POA: Diagnosis not present

## 2015-07-03 DIAGNOSIS — G4734 Idiopathic sleep related nonobstructive alveolar hypoventilation: Secondary | ICD-10-CM

## 2015-07-03 DIAGNOSIS — G4761 Periodic limb movement disorder: Secondary | ICD-10-CM

## 2015-07-03 DIAGNOSIS — R9431 Abnormal electrocardiogram [ECG] [EKG]: Secondary | ICD-10-CM

## 2015-07-03 DIAGNOSIS — G479 Sleep disorder, unspecified: Secondary | ICD-10-CM

## 2015-07-04 NOTE — Sleep Study (Signed)
Please see the scanned sleep study interpretation located in the procedure tab in the chart view section.

## 2015-07-05 ENCOUNTER — Telehealth: Payer: Self-pay | Admitting: Neurology

## 2015-07-05 DIAGNOSIS — G4733 Obstructive sleep apnea (adult) (pediatric): Secondary | ICD-10-CM

## 2015-07-05 NOTE — Telephone Encounter (Signed)
I spoke to patient and he is aware of results and recommendation. He would like to proceed with treatment. I will refer to [place]. I will send report to PCP and Dr. Einar Gip. I will send the patient a letter reminding him to make appt and stress importance of compliance.

## 2015-07-05 NOTE — Telephone Encounter (Signed)
Left message on cell phone to call us back and left message with wife at home to have him call us back.

## 2015-07-05 NOTE — Telephone Encounter (Signed)
Patient referred by Dr. Einar Gip, seen by me on 03/24/15, HST on 05/12/15, CPAP study on 07/03/15, ins. [employer]: Please call and inform patient that I have entered an order for treatment with positive airway pressure (PAP) treatment of obstructive sleep apnea (OSA). He did fairly well during the latest sleep study with CPAP. We will, therefore, arrange for a machine for home use through a DME (durable medical equipment) company of His choice; and I will see the patient back in follow-up in about 8-10 weeks. Please also explain to the patient that I will be looking out for compliance data, which can be downloaded from the machine (stored on an SD card, that is inserted in the machine) or via remote access through a modem, that is built into the machine. At the time of the followup appointment we will discuss sleep study results and how it is going with PAP treatment at home. Please advise patient to bring His machine at the time of the first FU visit, even though this is cumbersome. Bringing the machine for every visit after that will likely not be needed, but often helps for the first visit to troubleshoot if needed. Please re-enforce the importance of compliance with treatment and the need for [place] to monitor compliance data - often an insurance requirement and actually good feedback for the patient as far as how they are doing.  Also remind patient, that any interim PAP machine or mask issues should be first addressed with the DME company, as they can often help better with technical and mask fit issues. Please ask if patient has a preference regarding DME company.  Please also make sure, the patient has a follow-up appointment with me in about 8-10 weeks from the setup date, thanks.  Once you have spoken to the patient - and faxed/routed report to PCP and referring MD (if other than PCP), you can close this encounter, thanks,   Star Age, MD, PhD [facility]   Please find out if  the patient has seen a lung doctor before, if not, I would like to make a referral as his oxygen levels were low, even during wakefulness.

## 2015-07-05 NOTE — Telephone Encounter (Signed)
Pt returned Diana's call

## 2015-07-24 DIAGNOSIS — I4821 Permanent atrial fibrillation: Secondary | ICD-10-CM

## 2015-07-24 HISTORY — DX: Permanent atrial fibrillation: I48.21

## 2015-07-29 DIAGNOSIS — G4733 Obstructive sleep apnea (adult) (pediatric): Secondary | ICD-10-CM | POA: Diagnosis not present

## 2015-08-23 DIAGNOSIS — M719 Bursopathy, unspecified: Secondary | ICD-10-CM | POA: Diagnosis not present

## 2015-08-23 DIAGNOSIS — M25561 Pain in right knee: Secondary | ICD-10-CM | POA: Diagnosis not present

## 2015-08-23 DIAGNOSIS — M179 Osteoarthritis of knee, unspecified: Secondary | ICD-10-CM | POA: Diagnosis not present

## 2015-08-25 DIAGNOSIS — E119 Type 2 diabetes mellitus without complications: Secondary | ICD-10-CM | POA: Diagnosis not present

## 2015-08-25 DIAGNOSIS — I480 Paroxysmal atrial fibrillation: Secondary | ICD-10-CM | POA: Diagnosis not present

## 2015-08-25 DIAGNOSIS — E7800 Pure hypercholesterolemia, unspecified: Secondary | ICD-10-CM | POA: Diagnosis not present

## 2015-08-25 DIAGNOSIS — G4733 Obstructive sleep apnea (adult) (pediatric): Secondary | ICD-10-CM | POA: Diagnosis not present

## 2015-08-29 DIAGNOSIS — G4733 Obstructive sleep apnea (adult) (pediatric): Secondary | ICD-10-CM | POA: Diagnosis not present

## 2015-08-30 DIAGNOSIS — E119 Type 2 diabetes mellitus without complications: Secondary | ICD-10-CM | POA: Diagnosis not present

## 2015-08-30 DIAGNOSIS — Z961 Presence of intraocular lens: Secondary | ICD-10-CM | POA: Diagnosis not present

## 2015-08-30 DIAGNOSIS — H2512 Age-related nuclear cataract, left eye: Secondary | ICD-10-CM | POA: Diagnosis not present

## 2015-08-30 DIAGNOSIS — Z7984 Long term (current) use of oral hypoglycemic drugs: Secondary | ICD-10-CM | POA: Diagnosis not present

## 2015-09-01 DIAGNOSIS — I10 Essential (primary) hypertension: Secondary | ICD-10-CM | POA: Diagnosis not present

## 2015-09-01 DIAGNOSIS — E119 Type 2 diabetes mellitus without complications: Secondary | ICD-10-CM | POA: Diagnosis not present

## 2015-09-07 ENCOUNTER — Encounter: Payer: Self-pay | Admitting: Neurology

## 2015-09-07 ENCOUNTER — Ambulatory Visit (INDEPENDENT_AMBULATORY_CARE_PROVIDER_SITE_OTHER): Payer: Medicare HMO | Admitting: Neurology

## 2015-09-07 ENCOUNTER — Ambulatory Visit: Payer: Medicare HMO | Admitting: Neurology

## 2015-09-07 VITALS — BP 142/78 | HR 80 | Resp 20 | Ht 74.5 in | Wt 238.0 lb

## 2015-09-07 DIAGNOSIS — G4734 Idiopathic sleep related nonobstructive alveolar hypoventilation: Secondary | ICD-10-CM

## 2015-09-07 DIAGNOSIS — G4733 Obstructive sleep apnea (adult) (pediatric): Secondary | ICD-10-CM | POA: Diagnosis not present

## 2015-09-07 DIAGNOSIS — Z9989 Dependence on other enabling machines and devices: Secondary | ICD-10-CM

## 2015-09-07 NOTE — Progress Notes (Signed)
Subjective:    [name] ID: Manuel [name] is a 70 y.o. male.  HPI     Interim history:   Manuel [name] is a 70 year old right-handed gentleman with an underlying medical history of hypertension, type 2 diabetes, hyperlipidemia, left carotid bruit, arthritis, paroxysmal atrial fibrillation, status post cardioversion on 12/18/2011 and obesity, who presents for follow-up consultation of his OSA, after his recent sleep studies. Manuel [name] is unaccompanied today. I first met him on 03/24/2015 at Manuel request of his cardiologist, Dr. Einar Gip, at which time Manuel [name] reported daytime somnolence, nonrestorative sleep and a prior diagnosis of moderate to severe OSA based on his records. I invited him back for sleep study. He had a home sleep test on 05/12/2015 which indicated severe sleep disordered breathing with an AHI of 30.8 per hour, oxyhemoglobin desaturation nadir was 74%, time below 90% saturation was over 5 hours, time below 88% saturation was over 4 hours. Based on his history and test results I invited him back for a full night CPAP titration study. He had this on 07/03/2015. Sleep efficiency was 52.6%, latency to sleep 17.5 minutes and wake after sleep onset was 177.5 minutes with several longer periods of wakefulness noted. His arousal index was mildly elevated. He had an increased percentage of stage II sleep, absence of slow-wave sleep and a markedly decreased percentage of REM sleep at 5.8% with a prolonged REM latency of 162.5 minutes. He had mild PLMS with an index of 15.2 per hour, resulting in no significant arousals. Average oxygen saturation was 85% only, oxyhemoglobin desaturation nadir was 80%. Time below 90% saturation was 3 hours and 26 minutes. CPAP was titrated from 5 cm to 7 cm. His oxygen saturations were in Manuel high 80s and low 90s even during wakefulness and he achieved very little sleep on Manuel final pressure. Based on his test results I prescribed CPAP therapy at 8 cm for home use  and also recommended evaluation with a pulmonologist.  Today, 09/07/2015: I reviewed his CPAP compliance data from 08/07/2015 through 09/05/2015 which is a total of 30 days during which time he used his machine 30 days with percent used days greater than 4 hours at 97%, indicating excellent compliance with an average usage of 7 hours and 18 minutes, residual AHI at 2.9 per hour, leak at Manuel 95th percentile at 56.7 L/m on a pressure of 8 cm with EPR of 3.  Today, 09/07/2015: He reports feeling better, better rested, less sleepy during Manuel day, less nocturia, more energy and in fact, looks forward to going to sleep. No history of lung d/s, no pneumonias, stopped smoking in 2014.   Previously: 03/24/2015: He was previously was diagnosed with moderate to severe obstructive sleep apnea based on your office note. He had a sleep study according to your records on 11/29/2011. I reviewed your office note from 02/17/2015 which you kindly included. Previous test results were reviewed: Carotid Doppler study from 05/15/2012 showed minimal stenosis of Manuel right proximal common carotid artery, minimal stenosis of Manuel left proximal common carotid artery. Echocardiogram from 11/08/2011 showed normal left ventricular EF, moderate LVH, moderate to severe left atrial enlargement, trace MR, trace TR, no pulmonary hypertension.   Previous sleep study results are not available for my review. He had his sleep [facility] done at [facility]. He reports restless sleep, snoring and excessive daytime somnolence. His ESS is 13/24 today. He complaints of not sleeping well, feeling exhausted. His fatigue score is 39/63. His bedtime is around  10 PM. He has to use Manuel bathroom 2-3 times per night on average. He denies morning headaches and a FHx of OSA. His diabetes is under good control, per [name].  He reports a bedtime of around 10 PM. He has nocturia 2-3 times per night. His rise time is between 6 and 6:15 AM. He does not  drink alcohol, he quit smoking, he does admit that he had some relapse with his smoking and most recently quit 3 months ago. He drinks caffeine in Manuel form of coffee, 2-3 per day. He denies any significant restless leg symptoms. His Epworth sleepiness score is 13 out of 24, his fatigue score is 39 out of 63. He does not wake up rested.   His Past Medical History Is Significant For: Past Medical History  Diagnosis Date  . Diabetes mellitus   . Hypertension   . BPH (benign prostatic hyperplasia)   . S/P colonoscopy   . A-fib ([place])   . Left carotid bruit   . OSA (obstructive sleep apnea)   . Dizziness and giddiness   . Dyslipidemia     His Past Surgical History Is Significant For: Past Surgical History  Procedure Laterality Date  . Hernia repair    . 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 Cystostomy      off back  . Cardioversion  12/18/2011    Procedure: CARDIOVERSION;  Surgeon: Laverda Page, MD;  Location: [facility] OR;  Service: Cardiovascular;  Laterality: N/A;    His Family History Is Significant For: Family History  Problem Relation Age of Onset  . Cancer Father   . Alzheimer's disease Father   . Kidney disease Paternal Aunt   . Cancer Mother     His Social History Is Significant For: Social History   Social History  . Marital Status: Married    Spouse Name: N/A  . Number of Children: 1  . Years of Education: N/A   Occupational History  . [employer]   Social History Main Topics  . Smoking status: Former Research scientist (life sciences)  . Smokeless tobacco: None     Comment: 12/2014  . Alcohol Use: No  . Drug Use: No  . Sexual Activity: Not Asked   Other Topics Concern  . None   Social History Narrative   Drinks 2-3 caffeine drinks a day     His Allergies Are:  Allergies  Allergen Reactions  . Sulfa Antibiotics Other (See Comments)    Headaches   :   His Current Medications Are:  Outpatient Encounter Prescriptions as of 09/07/2015   Medication Sig  . diltiazem (CARDIZEM) 120 MG tablet Take 120 mg by mouth daily.   Marland Kitchen doxazosin (CARDURA) 8 MG tablet Take 8 mg by mouth daily.  Marland Kitchen edoxaban (SAVAYSA) 60 MG TABS tablet Take by mouth daily.  Marland Kitchen ELIQUIS 5 MG TABS tablet TK 1 T PO BID AFTER FOOD  . glimepiride (AMARYL) 1 MG tablet Take 1 mg by mouth daily with breakfast.  . lisinopril (PRINIVIL,ZESTRIL) 10 MG tablet Take 10 mg by mouth daily.   Marland Kitchen losartan (COZAAR) 25 MG tablet Take 25 mg by mouth daily.  . metFORMIN (GLUMETZA) 500 MG (MOD) 24 hr tablet Take 500 mg by mouth 2 (two) times daily with a meal.   . pravastatin (PRAVACHOL) 10 MG tablet Take 20 mg by mouth daily.    No facility-administered encounter medications on file as of 09/07/2015.  :  Review of Systems:  Out of a complete 14 point review of systems, all are reviewed and negative with Manuel exception of these symptoms as listed below:   Review of Systems  Neurological:       [name] feels like he is doing well with CPAP. No new concerns.     Objective:  Neurologic Exam  Physical Exam Physical Examination:   Filed Vitals:   09/07/15 1402  BP: 142/78  Pulse: 80  Resp: 20    General Examination: Manuel [name] is a very pleasant 70 y.o. male in no acute distress. He appears well-developed and well-nourished and well groomed. He is in good spirits today.  HEENT: Normocephalic, atraumatic, pupils are equal, round and reactive to light and accommodation. Extraocular tracking is good without limitation to gaze excursion or nystagmus noted. Normal smooth pursuit is noted. Hearing is grossly intact. Face is symmetric with normal facial animation and normal facial sensation. Speech is clear with no dysarthria noted. There is no hypophonia. There is no lip, neck/head, jaw or voice tremor. Neck is supple with full range of passive and active motion. There are no carotid bruits on auscultation. Oropharynx exam reveals: mild mouth dryness, adequate dental hygiene and  moderate airway crowding, due to thick soft palate, redundant soft palate, large uvula, larger tongue and tonsils in place. Mallampati is class III. Tongue protrudes centrally and palate elevates symmetrically. Tonsils are 1+ in size.   Chest: Clear to auscultation without wheezing, rhonchi or crackles noted.  Heart: S1+S2+0, irregularly irregular without murmurs, rubs or gallops noted.   Abdomen: Soft, non-tender and non-distended with normal bowel sounds appreciated on auscultation.  Extremities: There is trace pitting edema in Manuel distal lower extremities bilaterally, unchanged. Pedal pulses are intact.  Skin: Warm and dry without trophic changes noted. There are no varicose veins.  Musculoskeletal: exam reveals no obvious joint deformities, tenderness or joint swelling or erythema.   Neurologically:  Mental status: Manuel [name] is awake, alert and oriented in all 4 spheres. His immediate and remote memory, attention, language skills and fund of knowledge are appropriate. There is no evidence of aphasia, agnosia, apraxia or anomia. Speech is clear with normal prosody and enunciation. Thought process is linear. Mood is normal and affect is normal.  Cranial nerves II - XII are as described above under HEENT exam. In addition: shoulder shrug is normal with equal shoulder height noted. Motor exam: Normal bulk, strength and tone is noted. There is no drift, tremor or rebound. Romberg is negative. Reflexes are 2+ throughout. Fine motor skills and coordination: intact with normal finger taps, normal hand movements, normal rapid alternating patting, normal foot taps and normal foot agility.  Cerebellar testing: No dysmetria or intention tremor on finger to nose testing. Heel to shin is unremarkable bilaterally. There is no truncal or gait ataxia.  Sensory exam: intact to light touch in Manuel upper and lower extremities.  Gait, station and balance: He stands easily. No veering to one side is noted. No  leaning to one side is noted. Posture is age-appropriate and stance is narrow based. Gait shows normal stride length and normal pace. No problems turning are noted. He turns en bloc. Tandem walk is slightly difficult for him.   Assessment and Plan:   In summary, Manuel [name] is a very pleasant 70 year old male with an underlying medical history of hypertension, type 2 diabetes, hyperlipidemia, left carotid bruit, arthritis, paroxysmal atrial fibrillation, status post cardioversion on 12/18/2011 and obesity, who presents for follow-up  consultation of his OSA, after his recent sleep studies. We talked about his home sleep test from October 2016 as well as his CPAP titration test results from 07/03/2015 today. We also reviewed his compliance data. He is fully compliant with treatment and is commended for this. In addition, he reports improvement of his daytime sleepiness, improved daytime energy level, improved nocturia and sleep consolidation. He is pleased with how he's doing. He had some lower baseline oxygen saturations during Manuel CPAP titration study and also during Manuel home sleep test. I would like to proceed with an overnight pulse oximetry test while he is on his CPAP at night. We will get in touch with his DME company for this. In addition, we need to work on lowering his air leak from Manuel mask. This may be addressed by his DME company as well. His physical exam is stable. I would like to see him back in 6 months and in Manuel interim we will be in touch regarding his pulse oximetry test results. His history does not suggest any underlying lung disease, and he stopped smoking about 2 or 3 years ago.   I again explained in particular Manuel risks and ramifications of untreated moderate to severe OSA, especially with respect to developing cardiovascular disease down Manuel [address], including congestive heart failure, difficult to [name] hypertension, cardiac arrhythmias, or stroke. Even type 2 diabetes has, in part,  been linked to untreated OSA. Symptoms of untreated OSA include daytime sleepiness, memory problems, mood irritability and mood disorder such as depression and anxiety, lack of energy, as well as recurrent headaches, especially morning headaches. We talked about smoking cessation and trying to maintain a healthy lifestyle in general, as well as Manuel importance of weight control. I encouraged Manuel [name] to eat healthy, exercise daily and keep well hydrated, to keep a scheduled bedtime and wake time routine, to not skip any meals and eat healthy snacks in between meals. I advised Manuel [name] not to drive when feeling sleepy. I asked him to continue to use CPAP regularly. I explained Manuel importance of being compliant with PAP treatment, not only for insurance purposes but primarily to improve His symptoms, and for Manuel [name]'s long term health benefit, including to reduce His cardiovascular risks. I answered all his questions today and Manuel [name] was in agreement.  I spent 25 minutes in total face-to-face time with Manuel [name], more than 50% of which was spent in counseling and coordination of care, reviewing test results, reviewing medication and discussing or reviewing Manuel diagnosis of OSA, its prognosis and treatment options.

## 2015-09-07 NOTE — Patient Instructions (Signed)
Please continue using your CPAP regularly. While your insurance requires that you use CPAP at least 4 hours each night on 70% of the nights, I recommend, that you not skip any nights and use it throughout the night if you can. Getting used to CPAP and staying with the treatment long term does take time and patience and discipline. Untreated obstructive sleep apnea when it is moderate to severe can have an adverse impact on cardiovascular health and raise her risk for heart disease, arrhythmias, hypertension, congestive heart failure, stroke and diabetes. Untreated obstructive sleep apnea causes sleep disruption, nonrestorative sleep, and sleep deprivation. This can have an impact on your day to day functioning and cause daytime sleepiness and impairment of cognitive function, memory loss, mood disturbance, and problems focussing. Using CPAP regularly can improve these symptoms.  Keep up the good work! I will see you back in 6 months for sleep apnea check up, and if you continue to do well on CPAP I will see you once a year thereafter.   We will set up an oxygen test at home for you for one night, while you are on your CPAP. [employer], your DME company will be in touch for this.

## 2015-09-08 DIAGNOSIS — E785 Hyperlipidemia, unspecified: Secondary | ICD-10-CM | POA: Diagnosis not present

## 2015-09-08 DIAGNOSIS — Z136 Encounter for screening for cardiovascular disorders: Secondary | ICD-10-CM | POA: Diagnosis not present

## 2015-09-08 DIAGNOSIS — Z1389 Encounter for screening for other disorder: Secondary | ICD-10-CM | POA: Diagnosis not present

## 2015-09-08 DIAGNOSIS — I10 Essential (primary) hypertension: Secondary | ICD-10-CM | POA: Diagnosis not present

## 2015-09-08 DIAGNOSIS — G4733 Obstructive sleep apnea (adult) (pediatric): Secondary | ICD-10-CM | POA: Diagnosis not present

## 2015-09-08 DIAGNOSIS — E119 Type 2 diabetes mellitus without complications: Secondary | ICD-10-CM | POA: Diagnosis not present

## 2015-09-23 DIAGNOSIS — G4733 Obstructive sleep apnea (adult) (pediatric): Secondary | ICD-10-CM | POA: Diagnosis not present

## 2015-09-26 DIAGNOSIS — G4733 Obstructive sleep apnea (adult) (pediatric): Secondary | ICD-10-CM | POA: Diagnosis not present

## 2015-10-11 DIAGNOSIS — G4733 Obstructive sleep apnea (adult) (pediatric): Secondary | ICD-10-CM | POA: Diagnosis not present

## 2015-10-18 DIAGNOSIS — M1120 Other chondrocalcinosis, unspecified site: Secondary | ICD-10-CM | POA: Diagnosis not present

## 2015-10-18 DIAGNOSIS — M1990 Unspecified osteoarthritis, unspecified site: Secondary | ICD-10-CM | POA: Diagnosis not present

## 2015-10-27 DIAGNOSIS — G4733 Obstructive sleep apnea (adult) (pediatric): Secondary | ICD-10-CM | POA: Diagnosis not present

## 2015-11-11 DIAGNOSIS — G4733 Obstructive sleep apnea (adult) (pediatric): Secondary | ICD-10-CM | POA: Diagnosis not present

## 2015-11-26 DIAGNOSIS — G4733 Obstructive sleep apnea (adult) (pediatric): Secondary | ICD-10-CM | POA: Diagnosis not present

## 2015-12-12 DIAGNOSIS — G4733 Obstructive sleep apnea (adult) (pediatric): Secondary | ICD-10-CM | POA: Diagnosis not present

## 2015-12-16 DIAGNOSIS — E785 Hyperlipidemia, unspecified: Secondary | ICD-10-CM | POA: Diagnosis not present

## 2015-12-16 DIAGNOSIS — E119 Type 2 diabetes mellitus without complications: Secondary | ICD-10-CM | POA: Diagnosis not present

## 2015-12-16 DIAGNOSIS — I10 Essential (primary) hypertension: Secondary | ICD-10-CM | POA: Diagnosis not present

## 2015-12-23 DIAGNOSIS — E119 Type 2 diabetes mellitus without complications: Secondary | ICD-10-CM | POA: Diagnosis not present

## 2015-12-23 DIAGNOSIS — I480 Paroxysmal atrial fibrillation: Secondary | ICD-10-CM | POA: Diagnosis not present

## 2015-12-23 DIAGNOSIS — I10 Essential (primary) hypertension: Secondary | ICD-10-CM | POA: Diagnosis not present

## 2015-12-23 DIAGNOSIS — E7800 Pure hypercholesterolemia, unspecified: Secondary | ICD-10-CM | POA: Diagnosis not present

## 2015-12-27 DIAGNOSIS — G4733 Obstructive sleep apnea (adult) (pediatric): Secondary | ICD-10-CM | POA: Diagnosis not present

## 2016-01-26 DIAGNOSIS — G4733 Obstructive sleep apnea (adult) (pediatric): Secondary | ICD-10-CM | POA: Diagnosis not present

## 2016-02-22 DIAGNOSIS — E7800 Pure hypercholesterolemia, unspecified: Secondary | ICD-10-CM | POA: Diagnosis not present

## 2016-02-22 DIAGNOSIS — E119 Type 2 diabetes mellitus without complications: Secondary | ICD-10-CM | POA: Diagnosis not present

## 2016-02-22 DIAGNOSIS — G4733 Obstructive sleep apnea (adult) (pediatric): Secondary | ICD-10-CM | POA: Diagnosis not present

## 2016-02-22 DIAGNOSIS — I482 Chronic atrial fibrillation: Secondary | ICD-10-CM | POA: Diagnosis not present

## 2016-02-26 DIAGNOSIS — G4733 Obstructive sleep apnea (adult) (pediatric): Secondary | ICD-10-CM | POA: Diagnosis not present

## 2016-02-28 ENCOUNTER — Ambulatory Visit (INDEPENDENT_AMBULATORY_CARE_PROVIDER_SITE_OTHER): Payer: Medicare HMO | Admitting: Neurology

## 2016-02-28 ENCOUNTER — Encounter: Payer: Self-pay | Admitting: Neurology

## 2016-02-28 VITALS — BP 146/88 | HR 76 | Resp 18 | Ht 74.5 in | Wt 229.0 lb

## 2016-02-28 DIAGNOSIS — G4733 Obstructive sleep apnea (adult) (pediatric): Secondary | ICD-10-CM

## 2016-02-28 DIAGNOSIS — Z9989 Dependence on other enabling machines and devices: Secondary | ICD-10-CM

## 2016-02-28 NOTE — Patient Instructions (Signed)
Please call [employer] at  [phone] to make an appointment for a mask refit and work on reducing the air leak from the CPAP mask.   Please continue using your CPAP regularly. While your insurance requires that you use CPAP at least 4 hours each night on 70% of the nights, I recommend, that you not skip any nights and use it throughout the night if you can. Getting used to CPAP and staying with the treatment long term does take time and patience and discipline. Untreated obstructive sleep apnea when it is moderate to severe can have an adverse impact on cardiovascular health and raise her risk for heart disease, arrhythmias, hypertension, congestive heart failure, stroke and diabetes. Untreated obstructive sleep apnea causes sleep disruption, nonrestorative sleep, and sleep deprivation. This can have an impact on your day to day functioning and cause daytime sleepiness and impairment of cognitive function, memory loss, mood disturbance, and problems focussing. Using CPAP regularly can improve these symptoms.  Keep up the good work! I will see you back in 12 months for sleep apnea check up.

## 2016-02-28 NOTE — Progress Notes (Signed)
Subjective:    Patient ID: Manuel [name] is a 70 y.o. male.  HPI     Interim history:  Manuel [name] is a 70 year old right-handed gentleman with an underlying medical history of hypertension, type 2 diabetes, hyperlipidemia, left carotid bruit, arthritis, paroxysmal atrial fibrillation, status post cardioversion on 12/18/2011 and obesity, who presents for follow-up consultation of his OSA, established on CPAP therapy. The patient is unaccompanied today. I last saw him on 09/07/2015, at which time he was compliant with CPAP therapy and reported good results. We talked about his recent sleep study results as well. He had a home sleep test on 05/12/2015, and return for full night CPAP titration study.   Today, 02/28/2016: I reviewed his CPAP compliance data from 01/28/2016 through 02/26/2016 which is a total of 30 days during which time he used his machine every night with percent used days greater than 4 hours at 100%, indicating superb compliance with an average usage of 6 hours and 52 minutes, residual AHI 1.7 per hour, leak typically quite high with the 95th percentile at 68 L/m at a pressure of 8 cm with EPR of 3.   Today, 02/28/2016: He reports doing well with CPAP, no new complaints, feels he is doing well, as far as his leak from the mask, he does not feel bothered by air leak. He uses nasal pillows, but the strap may need replacing.   Previously:   I first met him on 03/24/2015 at the request of his cardiologist, Dr. Einar Gip, at which time the patient reported daytime somnolence, nonrestorative sleep and a prior diagnosis of moderate to severe OSA based on his records. I invited him back for sleep study. He had a home sleep test on 05/12/2015 which indicated severe sleep disordered breathing with an AHI of 30.8 per hour, oxyhemoglobin desaturation nadir was 74%, time below 90% saturation was over 5 hours, time below 88% saturation was over 4 hours. Based on his history and test results I  invited him back for a full night CPAP titration study. He had this on 07/03/2015. Sleep efficiency was 52.6%, latency to sleep 17.5 minutes and wake after sleep onset was 177.5 minutes with several longer periods of wakefulness noted. His arousal index was mildly elevated. He had an increased percentage of stage II sleep, absence of slow-wave sleep and a markedly decreased percentage of REM sleep at 5.8% with a prolonged REM latency of 162.5 minutes. He had mild PLMS with an index of 15.2 per hour, resulting in no significant arousals. Average oxygen saturation was 85% only, oxyhemoglobin desaturation nadir was 80%. Time below 90% saturation was 3 hours and 26 minutes. CPAP was titrated from 5 cm to 7 cm. His oxygen saturations were in the high 80s and low 90s even during wakefulness and he achieved very little sleep on the final pressure. Based on his test results I prescribed CPAP therapy at 8 cm for home use and also recommended evaluation with a pulmonologist.   I reviewed his CPAP compliance data from 08/07/2015 through 09/05/2015 which is a total of 30 days during which time he used his machine 30 days with percent used days greater than 4 hours at 97%, indicating excellent compliance with an average usage of 7 hours and 18 minutes, residual AHI at 2.9 per hour, leak at the 95th percentile at 56.7 L/m on a pressure of 8 cm with EPR of 3.   03/24/2015: He was previously was diagnosed with moderate to severe obstructive sleep apnea based on  your office note. He had a sleep study according to your records on 11/29/2011. I reviewed your office note from 02/17/2015 which you kindly included. Previous test results were reviewed: Carotid Doppler study from 05/15/2012 showed minimal stenosis of the right proximal common carotid artery, minimal stenosis of the left proximal common carotid artery. Echocardiogram from 11/08/2011 showed normal left ventricular EF, moderate LVH, moderate to severe left atrial  enlargement, trace MR, trace TR, no pulmonary hypertension.   Previous sleep study results are not available for my review. He had his sleep study done at the [facility]. He reports restless sleep, snoring and excessive daytime somnolence. His ESS is 13/24 today. He complaints of not sleeping well, feeling exhausted. His fatigue score is 39/63. His bedtime is around 10 PM. He has to use the bathroom 2-3 times per night on average. He denies morning headaches and a FHx of OSA. His diabetes is under good control, per patient.  He reports a bedtime of around 10 PM. He has nocturia 2-3 times per night. His rise time is between 6 and 6:15 AM. He does not drink alcohol, he quit smoking, he does admit that he had some relapse with his smoking and most recently quit 3 months ago. He drinks caffeine in the form of coffee, 2-3 per day. He denies any significant restless leg symptoms. His Epworth sleepiness score is 13 out of 24, his fatigue score is 39 out of 63. He does not wake up rested.   His Past Medical History Is Significant For: Past Medical History:  Diagnosis Date  . A-fib ([place])   . BPH (benign prostatic hyperplasia)   . Diabetes mellitus   . Dizziness and giddiness   . Dyslipidemia   . Hypertension   . Left carotid bruit   . OSA (obstructive sleep apnea)   . S/P colonoscopy     His Past Surgical History Is Significant For: Past Surgical History:  Procedure Laterality Date  . CARDIOVERSION  12/18/2011   Procedure: CARDIOVERSION;  Surgeon: Laverda Page, MD;  Location: [place];  Service: Cardiovascular;  Laterality: N/A;  . CYSTOSTOMY     off back  . HERNIA REPAIR    . 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 Cancer Father   . Alzheimer's disease Father   . Kidney disease Paternal Aunt   . Cancer Mother     His Social History Is Significant For: Social History    Social History  . Marital status: Married    Spouse name: N/A  . Number of children: 1  . Years of education: N/A   Occupational History  . [employer]   Social History Main Topics  . Smoking status: Former Research scientist (life sciences)  . Smokeless tobacco: None     Comment: 12/2014  . Alcohol use No  . Drug use: No  . Sexual activity: Not Asked   Other Topics Concern  . None   Social History Narrative   Drinks 2-3 caffeine drinks a day     His Allergies Are:  Allergies  Allergen Reactions  . Sulfa Antibiotics Other (See Comments)    Headaches   :   His Current Medications Are:  Outpatient Encounter Prescriptions as of 02/28/2016  Medication Sig  . diltiazem (CARDIZEM) 120 MG tablet Take 120 mg by mouth daily.   Marland Kitchen doxazosin (  CARDURA) 8 MG tablet Take 8 mg by mouth daily.  Marland Kitchen ELIQUIS 5 MG TABS tablet TK 1 T PO BID AFTER FOOD  . glimepiride (AMARYL) 1 MG tablet Take 1 mg by mouth daily with breakfast.  . lisinopril (PRINIVIL,ZESTRIL) 10 MG tablet Take 10 mg by mouth daily.   . metFORMIN (GLUMETZA) 500 MG (MOD) 24 hr tablet Take 500 mg by mouth 2 (two) times daily with a meal.   . pravastatin (PRAVACHOL) 20 MG tablet   . [DISCONTINUED] edoxaban (SAVAYSA) 60 MG TABS tablet Take by mouth daily.  . [DISCONTINUED] losartan (COZAAR) 25 MG tablet Take 25 mg by mouth daily.  . [DISCONTINUED] pravastatin (PRAVACHOL) 10 MG tablet Take 20 mg by mouth daily.    No facility-administered encounter medications on file as of 02/28/2016.   :  Review of Systems:  Out of a complete 14 point review of systems, all are reviewed and negative with the exception of these symptoms as listed below: Review of Systems  Neurological:       Patient states that he is doing well with CPAP. No new concerns.     Objective:  Neurologic Exam  Physical Exam Physical Examination:   Vitals:   02/28/16 1018  BP: (!) 146/88  Pulse: 76  Resp: 18    General Examination: The patient is a very pleasant 70 y.o.  male in no acute distress. He appears well-developed and well-nourished and well groomed. He is in good spirits today.  HEENT: Normocephalic, atraumatic, pupils are equal, round and reactive to light and accommodation. Extraocular tracking is good without limitation to gaze excursion or nystagmus noted. Normal smooth pursuit is noted. Hearing is grossly intact. Face is symmetric with normal facial animation and normal facial sensation. Speech is clear with no dysarthria noted. There is no hypophonia. There is no lip, neck/head, jaw or voice tremor. Neck is supple with full range of passive and active motion. There are no carotid bruits on auscultation. Oropharynx exam reveals: mild mouth dryness, adequate dental hygiene and moderate airway crowding, due to thick soft palate, redundant soft palate, large uvula, larger tongue and tonsils in place. Mallampati is class III. Tongue protrudes centrally and palate elevates symmetrically. Tonsils are 1+ in size.   Chest: Clear to auscultation without wheezing, rhonchi or crackles noted.  Heart: S1+S2+0, irregularly irregular without murmurs, rubs or gallops noted.   Abdomen: Soft, non-tender and non-distended with normal bowel sounds appreciated on auscultation.  Extremities: There is 1+ pitting edema in the distal lower extremities bilaterally, slightly worse.   Skin: Warm and dry without trophic changes noted. There are no varicose veins.  Musculoskeletal: exam reveals no obvious joint deformities, tenderness or joint swelling or erythema.   Neurologically:  Mental status: The patient is awake, alert and oriented in all 4 spheres. His immediate and remote memory, attention, language skills and fund of knowledge are appropriate. There is no evidence of aphasia, agnosia, apraxia or anomia. Speech is clear with normal prosody and enunciation. Thought process is linear. Mood is normal and affect is normal.  Cranial nerves II - XII are as described above  under HEENT exam. In addition: shoulder shrug is normal with equal shoulder height noted. Motor exam: Normal bulk, strength and tone is noted. There is no drift, tremor or rebound. Romberg is negative. Reflexes are 1-2+ throughout. Fine motor skills and coordination: intact with normal finger taps, normal hand movements, normal rapid alternating patting, normal foot taps and normal foot agility.  Cerebellar testing: No  dysmetria or intention tremor on finger to nose testing. Heel to shin is unremarkable bilaterally. There is no truncal or gait ataxia.  Sensory exam: intact to light touch in the upper and lower extremities.  Gait, station and balance: He stands easily. No veering to one side is noted. No leaning to one side is noted. Posture is age-appropriate and stance is narrow based. Gait shows normal stride length and normal pace. No problems turning are noted. Tandem walk is slightly difficult for him, unchanged.   Assessment and Plan:   In summary, Manuel [name] is a very pleasant 70 year old male with an underlying medical history of hypertension, type 2 diabetes, hyperlipidemia, left carotid bruit, arthritis, paroxysmal atrial fibrillation, status post cardioversion on 12/18/2011 and obesity, who presents for follow-up consultation of his OSA, Well established on CPAP therapy. He had a home sleep test in October 2016 which showed severe obstructive sleep apnea and then return for a full night CPAP titration study on 07/03/2015. We have previously reviewed his test results in detail. We reviewed his most recent compliance data. He is commended for his superb treatment adherence. Nevertheless, we still have to work on improving his air leaking. He does not feel very bothered by air leak but he may need new supplies, a proper nasal pillows refit and a new head strap. I placed an order for new supplies and also encouraged patient to call his DME company to make an appointment. after his recent sleep  studies. We talked about his home sleep test from October 2016 as well as his CPAP titration test results from 07/03/2015 today. He reports improvement of his daytime sleepiness, improved daytime energy level, improved nocturia and sleep consolidation. At this juncture, I will see him back in one year, sooner as needed. He is strongly encouraged to call with any interim problems or concerns.  I answered all his questions today and the patient was in agreement.  I spent 25 minutes in total face-to-face time with the patient, more than 50% of which was spent in counseling and coordination of care, reviewing test results, reviewing medication and discussing or reviewing the diagnosis of OSA, its prognosis and treatment options.

## 2016-02-29 DIAGNOSIS — Z86010 Personal history of colonic polyps: Secondary | ICD-10-CM | POA: Diagnosis not present

## 2016-02-29 DIAGNOSIS — G4730 Sleep apnea, unspecified: Secondary | ICD-10-CM | POA: Diagnosis not present

## 2016-02-29 DIAGNOSIS — I10 Essential (primary) hypertension: Secondary | ICD-10-CM | POA: Diagnosis not present

## 2016-02-29 DIAGNOSIS — I482 Chronic atrial fibrillation: Secondary | ICD-10-CM | POA: Diagnosis not present

## 2016-03-21 ENCOUNTER — Other Ambulatory Visit: Payer: Self-pay | Admitting: Cardiology

## 2016-03-21 ENCOUNTER — Ambulatory Visit
Admission: RE | Admit: 2016-03-21 | Discharge: 2016-03-21 | Disposition: A | Discharge status: Home / Self Care | Payer: Self-pay | Source: Ambulatory Visit | Attending: Cardiology | Admitting: Cardiology

## 2016-03-21 DIAGNOSIS — R0689 Other abnormalities of breathing: Secondary | ICD-10-CM | POA: Diagnosis not present

## 2016-03-21 DIAGNOSIS — G4733 Obstructive sleep apnea (adult) (pediatric): Secondary | ICD-10-CM | POA: Diagnosis not present

## 2016-03-28 ENCOUNTER — Other Ambulatory Visit: Payer: Self-pay | Admitting: Cardiology

## 2016-03-28 ENCOUNTER — Ambulatory Visit
Admission: RE | Admit: 2016-03-28 | Discharge: 2016-03-28 | Disposition: A | Discharge status: Home / Self Care | Payer: Medicare HMO | Source: Ambulatory Visit | Attending: Cardiology | Admitting: Cardiology

## 2016-03-28 DIAGNOSIS — R069 Unspecified abnormalities of breathing: Secondary | ICD-10-CM

## 2016-03-28 DIAGNOSIS — J449 Chronic obstructive pulmonary disease, unspecified: Secondary | ICD-10-CM | POA: Diagnosis not present

## 2016-03-28 DIAGNOSIS — R9389 Abnormal findings on diagnostic imaging of other specified body structures: Secondary | ICD-10-CM

## 2016-03-28 DIAGNOSIS — G4733 Obstructive sleep apnea (adult) (pediatric): Secondary | ICD-10-CM | POA: Diagnosis not present

## 2016-04-03 DIAGNOSIS — K5730 Diverticulosis of large intestine without perforation or abscess without bleeding: Secondary | ICD-10-CM | POA: Diagnosis not present

## 2016-04-03 DIAGNOSIS — D123 Benign neoplasm of transverse colon: Secondary | ICD-10-CM | POA: Diagnosis not present

## 2016-04-03 DIAGNOSIS — K635 Polyp of colon: Secondary | ICD-10-CM | POA: Diagnosis not present

## 2016-04-03 DIAGNOSIS — D122 Benign neoplasm of ascending colon: Secondary | ICD-10-CM | POA: Diagnosis not present

## 2016-04-03 DIAGNOSIS — Z86010 Personal history of colonic polyps: Secondary | ICD-10-CM | POA: Diagnosis not present

## 2016-04-03 DIAGNOSIS — Z1211 Encounter for screening for malignant neoplasm of colon: Secondary | ICD-10-CM | POA: Diagnosis not present

## 2016-04-19 DIAGNOSIS — G4733 Obstructive sleep apnea (adult) (pediatric): Secondary | ICD-10-CM | POA: Diagnosis not present

## 2016-04-27 DIAGNOSIS — G4733 Obstructive sleep apnea (adult) (pediatric): Secondary | ICD-10-CM | POA: Diagnosis not present

## 2016-05-11 DIAGNOSIS — I480 Paroxysmal atrial fibrillation: Secondary | ICD-10-CM | POA: Diagnosis not present

## 2016-05-11 DIAGNOSIS — E118 Type 2 diabetes mellitus with unspecified complications: Secondary | ICD-10-CM | POA: Diagnosis not present

## 2016-05-11 DIAGNOSIS — I10 Essential (primary) hypertension: Secondary | ICD-10-CM | POA: Diagnosis not present

## 2016-05-18 DIAGNOSIS — I10 Essential (primary) hypertension: Secondary | ICD-10-CM | POA: Diagnosis not present

## 2016-05-18 DIAGNOSIS — I480 Paroxysmal atrial fibrillation: Secondary | ICD-10-CM | POA: Diagnosis not present

## 2016-05-18 DIAGNOSIS — E119 Type 2 diabetes mellitus without complications: Secondary | ICD-10-CM | POA: Diagnosis not present

## 2016-05-18 DIAGNOSIS — Z125 Encounter for screening for malignant neoplasm of prostate: Secondary | ICD-10-CM | POA: Diagnosis not present

## 2016-05-21 DIAGNOSIS — G4733 Obstructive sleep apnea (adult) (pediatric): Secondary | ICD-10-CM | POA: Diagnosis not present

## 2016-05-28 DIAGNOSIS — G4733 Obstructive sleep apnea (adult) (pediatric): Secondary | ICD-10-CM | POA: Diagnosis not present

## 2016-06-21 DIAGNOSIS — Z7189 Other specified counseling: Secondary | ICD-10-CM | POA: Diagnosis not present

## 2016-06-21 DIAGNOSIS — I10 Essential (primary) hypertension: Secondary | ICD-10-CM | POA: Diagnosis not present

## 2016-06-25 DIAGNOSIS — G4733 Obstructive sleep apnea (adult) (pediatric): Secondary | ICD-10-CM | POA: Diagnosis not present

## 2016-06-29 DIAGNOSIS — E784 Other hyperlipidemia: Secondary | ICD-10-CM | POA: Diagnosis not present

## 2016-06-29 DIAGNOSIS — I4892 Unspecified atrial flutter: Secondary | ICD-10-CM | POA: Diagnosis not present

## 2016-06-29 DIAGNOSIS — E119 Type 2 diabetes mellitus without complications: Secondary | ICD-10-CM | POA: Diagnosis not present

## 2016-06-29 DIAGNOSIS — I4891 Unspecified atrial fibrillation: Secondary | ICD-10-CM | POA: Diagnosis not present

## 2016-06-29 DIAGNOSIS — Z0000 Encounter for general adult medical examination without abnormal findings: Secondary | ICD-10-CM | POA: Diagnosis not present

## 2016-06-29 DIAGNOSIS — I10 Essential (primary) hypertension: Secondary | ICD-10-CM | POA: Diagnosis not present

## 2016-06-29 DIAGNOSIS — Z6829 Body mass index (BMI) 29.0-29.9, adult: Secondary | ICD-10-CM | POA: Diagnosis not present

## 2016-07-21 DIAGNOSIS — G4733 Obstructive sleep apnea (adult) (pediatric): Secondary | ICD-10-CM | POA: Diagnosis not present

## 2016-08-22 DIAGNOSIS — G4733 Obstructive sleep apnea (adult) (pediatric): Secondary | ICD-10-CM | POA: Diagnosis not present

## 2016-09-19 DIAGNOSIS — Z136 Encounter for screening for cardiovascular disorders: Secondary | ICD-10-CM | POA: Diagnosis not present

## 2016-09-24 DIAGNOSIS — G4733 Obstructive sleep apnea (adult) (pediatric): Secondary | ICD-10-CM | POA: Diagnosis not present

## 2016-10-05 DIAGNOSIS — E119 Type 2 diabetes mellitus without complications: Secondary | ICD-10-CM | POA: Diagnosis not present

## 2016-10-05 DIAGNOSIS — I10 Essential (primary) hypertension: Secondary | ICD-10-CM | POA: Diagnosis not present

## 2016-10-05 DIAGNOSIS — I480 Paroxysmal atrial fibrillation: Secondary | ICD-10-CM | POA: Diagnosis not present

## 2016-10-16 DIAGNOSIS — E7800 Pure hypercholesterolemia, unspecified: Secondary | ICD-10-CM | POA: Diagnosis not present

## 2016-10-16 DIAGNOSIS — Z0000 Encounter for general adult medical examination without abnormal findings: Secondary | ICD-10-CM | POA: Diagnosis not present

## 2016-10-16 DIAGNOSIS — I10 Essential (primary) hypertension: Secondary | ICD-10-CM | POA: Diagnosis not present

## 2016-10-16 DIAGNOSIS — E119 Type 2 diabetes mellitus without complications: Secondary | ICD-10-CM | POA: Diagnosis not present

## 2016-10-16 DIAGNOSIS — Z125 Encounter for screening for malignant neoplasm of prostate: Secondary | ICD-10-CM | POA: Diagnosis not present

## 2016-10-25 DIAGNOSIS — G4733 Obstructive sleep apnea (adult) (pediatric): Secondary | ICD-10-CM | POA: Diagnosis not present

## 2016-11-16 ENCOUNTER — Other Ambulatory Visit: Payer: Self-pay | Admitting: Nephrology

## 2016-11-16 DIAGNOSIS — N183 Chronic kidney disease, stage 3 unspecified: Secondary | ICD-10-CM

## 2016-11-16 DIAGNOSIS — E785 Hyperlipidemia, unspecified: Secondary | ICD-10-CM | POA: Diagnosis not present

## 2016-11-16 DIAGNOSIS — I4891 Unspecified atrial fibrillation: Secondary | ICD-10-CM | POA: Diagnosis not present

## 2016-11-16 DIAGNOSIS — R809 Proteinuria, unspecified: Secondary | ICD-10-CM | POA: Diagnosis not present

## 2016-11-16 DIAGNOSIS — E1122 Type 2 diabetes mellitus with diabetic chronic kidney disease: Secondary | ICD-10-CM | POA: Diagnosis not present

## 2016-11-16 DIAGNOSIS — N2581 Secondary hyperparathyroidism of renal origin: Secondary | ICD-10-CM | POA: Diagnosis not present

## 2016-11-16 DIAGNOSIS — G4733 Obstructive sleep apnea (adult) (pediatric): Secondary | ICD-10-CM | POA: Diagnosis not present

## 2016-11-16 DIAGNOSIS — D631 Anemia in chronic kidney disease: Secondary | ICD-10-CM | POA: Diagnosis not present

## 2016-11-16 DIAGNOSIS — I129 Hypertensive chronic kidney disease with stage 1 through stage 4 chronic kidney disease, or unspecified chronic kidney disease: Secondary | ICD-10-CM | POA: Diagnosis not present

## 2016-11-22 DIAGNOSIS — N183 Chronic kidney disease, stage 3 (moderate): Secondary | ICD-10-CM | POA: Diagnosis not present

## 2016-11-23 ENCOUNTER — Ambulatory Visit
Admission: RE | Admit: 2016-11-23 | Discharge: 2016-11-23 | Disposition: A | Discharge status: Home / Self Care | Payer: Medicare HMO | Source: Ambulatory Visit | Attending: Nephrology | Admitting: Nephrology

## 2016-11-23 DIAGNOSIS — N183 Chronic kidney disease, stage 3 unspecified: Secondary | ICD-10-CM

## 2016-11-26 DIAGNOSIS — G4733 Obstructive sleep apnea (adult) (pediatric): Secondary | ICD-10-CM | POA: Diagnosis not present

## 2016-12-14 DIAGNOSIS — I4891 Unspecified atrial fibrillation: Secondary | ICD-10-CM | POA: Diagnosis not present

## 2016-12-14 DIAGNOSIS — G4733 Obstructive sleep apnea (adult) (pediatric): Secondary | ICD-10-CM | POA: Diagnosis not present

## 2016-12-14 DIAGNOSIS — D631 Anemia in chronic kidney disease: Secondary | ICD-10-CM | POA: Diagnosis not present

## 2016-12-14 DIAGNOSIS — Z6828 Body mass index (BMI) 28.0-28.9, adult: Secondary | ICD-10-CM | POA: Diagnosis not present

## 2016-12-14 DIAGNOSIS — N183 Chronic kidney disease, stage 3 (moderate): Secondary | ICD-10-CM | POA: Diagnosis not present

## 2016-12-14 DIAGNOSIS — R809 Proteinuria, unspecified: Secondary | ICD-10-CM | POA: Diagnosis not present

## 2016-12-14 DIAGNOSIS — E785 Hyperlipidemia, unspecified: Secondary | ICD-10-CM | POA: Diagnosis not present

## 2016-12-14 DIAGNOSIS — N2581 Secondary hyperparathyroidism of renal origin: Secondary | ICD-10-CM | POA: Diagnosis not present

## 2016-12-14 DIAGNOSIS — E1129 Type 2 diabetes mellitus with other diabetic kidney complication: Secondary | ICD-10-CM | POA: Diagnosis not present

## 2016-12-14 DIAGNOSIS — I129 Hypertensive chronic kidney disease with stage 1 through stage 4 chronic kidney disease, or unspecified chronic kidney disease: Secondary | ICD-10-CM | POA: Diagnosis not present

## 2016-12-27 DIAGNOSIS — G4733 Obstructive sleep apnea (adult) (pediatric): Secondary | ICD-10-CM | POA: Diagnosis not present

## 2017-01-04 DIAGNOSIS — N183 Chronic kidney disease, stage 3 (moderate): Secondary | ICD-10-CM | POA: Diagnosis not present

## 2017-01-04 DIAGNOSIS — I129 Hypertensive chronic kidney disease with stage 1 through stage 4 chronic kidney disease, or unspecified chronic kidney disease: Secondary | ICD-10-CM | POA: Diagnosis not present

## 2017-01-21 DIAGNOSIS — R600 Localized edema: Secondary | ICD-10-CM | POA: Diagnosis not present

## 2017-01-21 DIAGNOSIS — I10 Essential (primary) hypertension: Secondary | ICD-10-CM | POA: Diagnosis not present

## 2017-01-21 DIAGNOSIS — Z0000 Encounter for general adult medical examination without abnormal findings: Secondary | ICD-10-CM | POA: Diagnosis not present

## 2017-01-21 DIAGNOSIS — I4892 Unspecified atrial flutter: Secondary | ICD-10-CM | POA: Diagnosis not present

## 2017-01-21 DIAGNOSIS — E669 Obesity, unspecified: Secondary | ICD-10-CM | POA: Diagnosis not present

## 2017-01-21 DIAGNOSIS — E784 Other hyperlipidemia: Secondary | ICD-10-CM | POA: Diagnosis not present

## 2017-01-21 DIAGNOSIS — I4891 Unspecified atrial fibrillation: Secondary | ICD-10-CM | POA: Diagnosis not present

## 2017-01-21 DIAGNOSIS — E119 Type 2 diabetes mellitus without complications: Secondary | ICD-10-CM | POA: Diagnosis not present

## 2017-01-21 DIAGNOSIS — E049 Nontoxic goiter, unspecified: Secondary | ICD-10-CM | POA: Diagnosis not present

## 2017-01-21 DIAGNOSIS — M1310 Monoarthritis, not elsewhere classified, unspecified site: Secondary | ICD-10-CM | POA: Diagnosis not present

## 2017-01-28 DIAGNOSIS — G4733 Obstructive sleep apnea (adult) (pediatric): Secondary | ICD-10-CM | POA: Diagnosis not present

## 2017-02-11 DIAGNOSIS — E7800 Pure hypercholesterolemia, unspecified: Secondary | ICD-10-CM | POA: Diagnosis not present

## 2017-02-11 DIAGNOSIS — E119 Type 2 diabetes mellitus without complications: Secondary | ICD-10-CM | POA: Diagnosis not present

## 2017-02-11 DIAGNOSIS — I10 Essential (primary) hypertension: Secondary | ICD-10-CM | POA: Diagnosis not present

## 2017-02-18 DIAGNOSIS — Z23 Encounter for immunization: Secondary | ICD-10-CM | POA: Diagnosis not present

## 2017-02-18 DIAGNOSIS — E119 Type 2 diabetes mellitus without complications: Secondary | ICD-10-CM | POA: Diagnosis not present

## 2017-02-18 DIAGNOSIS — I10 Essential (primary) hypertension: Secondary | ICD-10-CM | POA: Diagnosis not present

## 2017-02-18 DIAGNOSIS — Z0000 Encounter for general adult medical examination without abnormal findings: Secondary | ICD-10-CM | POA: Diagnosis not present

## 2017-02-18 DIAGNOSIS — I480 Paroxysmal atrial fibrillation: Secondary | ICD-10-CM | POA: Diagnosis not present

## 2017-02-18 DIAGNOSIS — E7800 Pure hypercholesterolemia, unspecified: Secondary | ICD-10-CM | POA: Diagnosis not present

## 2017-02-26 ENCOUNTER — Encounter: Payer: Self-pay | Admitting: Neurology

## 2017-02-28 ENCOUNTER — Ambulatory Visit (INDEPENDENT_AMBULATORY_CARE_PROVIDER_SITE_OTHER): Payer: Medicare HMO | Admitting: Neurology

## 2017-02-28 ENCOUNTER — Encounter: Payer: Self-pay | Admitting: Neurology

## 2017-02-28 VITALS — BP 146/72 | HR 76 | Ht 75.0 in | Wt 220.0 lb

## 2017-02-28 DIAGNOSIS — G4733 Obstructive sleep apnea (adult) (pediatric): Secondary | ICD-10-CM

## 2017-02-28 DIAGNOSIS — Z9989 Dependence on other enabling machines and devices: Secondary | ICD-10-CM

## 2017-02-28 NOTE — Progress Notes (Signed)
Subjective:    Patient ID: Manuel [name] is a 71 y.o. male.  HPI      Interim history:   Manuel [name] is a 71 year old right-handed gentleman with an underlying medical history of hypertension, type 2 diabetes, hyperlipidemia, left carotid bruit, arthritis, paroxysmal atrial fibrillation, status post cardioversion on 12/18/2011 and obesity, who presents for follow-up consultation of his OSA, established on CPAP therapy. The patient is unaccompanied today. I last saw him on 02/28/2016, at which time Manuel [name] reported. I suggested a one-year checkup.  Today, 02/28/2017: I reviewed his CPAP compliance data from 01/28/2017 through 02/26/2017, which is a total of 30 days, during which time Manuel [name] used his machine every night with percent used days greater than 4 hours at 100%, indicating superb compliance with an average usage of 7 hours and 42 minutes, residual AHI 1.6 per hour, leak on the high side with the 95th percentile at 33 L/m on a pressure of 8 cm with EPR of 3. Manuel [name] reports doing well. Manuel [name] gets regular supplies. Manuel [name] had a recent bout of bronchitis but is currently doing okay, no fevers, no chest pain, no shortness of breath. Manuel [name] was not treated with any antibiotic. Manuel [name] is working on weight loss and has in fact lost about 9 pounds compared to last year. His blood pressure medicine was also increased, blood pressure improved as well.    Previously:    I saw him on 09/07/2015, at which time Manuel [name] was compliant with CPAP therapy and reported good results. We talked about his recent sleep study results as well. Manuel [name] had a home sleep test on 05/12/2015, and return for full night CPAP titration study.    I reviewed his CPAP compliance data from 01/28/2016 through 02/26/2016 which is a total of 30 days during which time Manuel [name] used his machine every night with percent used days greater than 4 hours at 100%, indicating superb compliance with an average usage of 6 hours and 52 minutes, residual AHI 1.7 per hour, leak  typically quite high with the 95th percentile at 68 L/m at a pressure of 8 cm with EPR of 3.     I first met him on 03/24/2015 at the request of his cardiologist, Dr. Einar Gip, at which time the patient reported daytime somnolence, nonrestorative sleep and a prior diagnosis of moderate to severe OSA based on his records. I invited him back for sleep study. Manuel [name] had a home sleep test on 05/12/2015 which indicated severe sleep disordered breathing with an AHI of 30.8 per hour, oxyhemoglobin desaturation nadir was 74%, time below 90% saturation was over 5 hours, time below 88% saturation was over 4 hours. Based on his history and test results I invited him back for a full night CPAP titration study. Manuel [name] had this on 07/03/2015. Sleep efficiency was 52.6%, latency to sleep 17.5 minutes and wake after sleep onset was 177.5 minutes with several longer periods of wakefulness noted. His arousal index was mildly elevated. Manuel [name] had an increased percentage of stage II sleep, absence of slow-wave sleep and a markedly decreased percentage of REM sleep at 5.8% with a prolonged REM latency of 162.5 minutes. Manuel [name] had mild PLMS with an index of 15.2 per hour, resulting in no significant arousals. Average oxygen saturation was 85% only, oxyhemoglobin desaturation nadir was 80%. Time below 90% saturation was 3 hours and 26 minutes. CPAP was titrated from 5 cm to 7 cm. His oxygen saturations were in the high 80s and low 90s even during wakefulness and  Manuel [name] achieved very little sleep on the final pressure. Based on his test results I prescribed CPAP therapy at 8 cm for home use and also recommended evaluation with a pulmonologist.   I reviewed his CPAP compliance data from 08/07/2015 through 09/05/2015 which is a total of 30 days during which time Manuel [name] used his machine 30 days with percent used days greater than 4 hours at 97%, indicating excellent compliance with an average usage of 7 hours and 18 minutes, residual AHI at 2.9 per hour, leak at  the 95th percentile at 56.7 L/m on a pressure of 8 cm with EPR of 3.   03/24/2015: Manuel [name] was previously was diagnosed with moderate to severe obstructive sleep apnea based on your office note. Manuel [name] had a sleep study according to your records on 11/29/2011. I reviewed your office note from 02/17/2015 which you kindly included. Previous test results were reviewed: Carotid Doppler study from 05/15/2012 showed minimal stenosis of the right proximal common carotid artery, minimal stenosis of the left proximal common carotid artery. Echocardiogram from 11/08/2011 showed normal left ventricular EF, moderate LVH, moderate to severe left atrial enlargement, trace MR, trace TR, no pulmonary hypertension.   Previous sleep study results are not available for my review. Manuel [name] had his sleep study done at the [facility]. Manuel [name] reports restless sleep, snoring and excessive daytime somnolence. His ESS is 13/24 today. Manuel [name] complaints of not sleeping well, feeling exhausted. His fatigue score is 39/63. His bedtime is around 10 PM. Manuel [name] has to use the bathroom 2-3 times per night on average. Manuel [name] denies morning headaches and a FHx of OSA. His diabetes is under good control, per patient.  Manuel [name] reports a bedtime of around 10 PM. Manuel [name] has nocturia 2-3 times per night. His rise time is between 6 and 6:15 AM. Manuel [name] does not drink alcohol, Manuel [name] quit smoking, Manuel [name] does admit that Manuel [name] had some relapse with his smoking and most recently quit 3 months ago. Manuel [name] drinks caffeine in the form of coffee, 2-3 per day. Manuel [name] denies any significant restless leg symptoms. His Epworth sleepiness score is 13 out of 24, his fatigue score is 39 out of 63. Manuel [name] does not wake up rested.    The patient's allergies, current medications, family history, past medical history, past social history, past surgical history and problem list were reviewed and updated as appropriate.    His Past Medical History Is Significant For: Past Medical History:  Diagnosis Date  . A-fib  ([place])   . BPH (benign prostatic hyperplasia)   . Diabetes mellitus   . Dizziness and giddiness   . Dyslipidemia   . Hypertension   . Left carotid bruit   . OSA (obstructive sleep apnea)   . S/P colonoscopy     His Past Surgical History Is Significant For: Past Surgical History:  Procedure Laterality Date  . CARDIOVERSION  12/18/2011   Procedure: CARDIOVERSION;  Surgeon: Laverda Page, MD;  Location: [facility];  Service: Cardiovascular;  Laterality: N/A;  . CYSTOSTOMY     off back  . HERNIA REPAIR    . 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 Cancer Father   . Alzheimer's disease Father   . Kidney disease Paternal Aunt   . Cancer Mother     His Social History Is Significant For: Social History   Social History  .  Marital status: Married    Spouse name: N/A  . Number of children: 1  . Years of education: N/A   Occupational History  . [employer]   Social History Main Topics  . Smoking status: Former Research scientist (life sciences)  . Smokeless tobacco: Never Used     Comment: 12/2014  . Alcohol use No  . Drug use: No  . Sexual activity: Not Asked   Other Topics Concern  . None   Social History Narrative   Drinks 2-3 caffeine drinks a day     His Allergies Are:  Allergies  Allergen Reactions  . Sulfa Antibiotics Other (See Comments)    Headaches   :   His Current Medications Are:  Outpatient Encounter Prescriptions as of 02/28/2017  Medication Sig  . diltiazem (CARDIZEM) 120 MG tablet Take 120 mg by mouth daily.   Marland Kitchen doxazosin (CARDURA) 8 MG tablet Take 8 mg by mouth daily.  Marland Kitchen ELIQUIS 5 MG TABS tablet TK 1 T PO BID AFTER FOOD  . glimepiride (AMARYL) 1 MG tablet Take 1 mg by mouth daily with breakfast.  . lisinopril (PRINIVIL,ZESTRIL) 20 MG tablet Take 20 mg by mouth at bedtime.  . metFORMIN (GLUMETZA) 500 MG (MOD) 24 hr tablet Take 500 mg by mouth 2 (two) times  daily with a meal.   . pravastatin (PRAVACHOL) 20 MG tablet   . [DISCONTINUED] lisinopril (PRINIVIL,ZESTRIL) 10 MG tablet Take 10 mg by mouth daily.    No facility-administered encounter medications on file as of 02/28/2017.   :  Review of Systems:  Out of a complete 14 point review of systems, all are reviewed and negative with the exception of these symptoms as listed below:  Review of Systems  Neurological:       Pt presents today to discuss his cpap. Pt reports that his cpap is going well.    Objective:  Neurological Exam  Physical Exam Physical Examination:   Vitals:   02/28/17 1051  BP: (!) 146/72  Pulse: 76   General Examination: The patient is a very pleasant 71 y.o. male in no acute distress. Manuel [name] appears well-developed and well-nourished and well groomed.   HEENT: Normocephalic, atraumatic, pupils are equal, round and reactive to light and accommodation. Extraocular tracking is good without limitation to gaze excursion or nystagmus noted. Normal smooth pursuit is noted. Hearing is grossly intact. Face is symmetric with normal facial animation and normal facial sensation. Speech is clear with no dysarthria noted. There is no hypophonia. There is no lip, neck/head, jaw or voice tremor. Neck is supple with full range of passive and active motion. Oropharynx exam reveals: mild mouth dryness, adequate dental hygiene and moderate airway crowding, due to thick soft palate, redundant soft palate, large uvula, larger tongue and tonsils in place. Mallampati is class III. Tongue protrudes centrally and palate elevates symmetrically. Tonsils are 1+ in size.   Chest: Without wheezing, but does have bibasilar crackles.  Heart: S1+S2+0, irregularly irregular without murmurs, rubs or gallops noted.   Abdomen: Soft, non-tender and non-distended with normal bowel sounds appreciated on auscultation.  Extremities: There is trace edema in the distal lower extremities bilaterally.   Skin:  Warm and dry without trophic changes noted. There are no varicose veins.  Musculoskeletal: exam reveals no obvious joint deformities, tenderness or joint swelling or erythema.   Neurologically:  Mental status: The patient is awake, alert and oriented in all 4 spheres. His immediate and remote memory, attention, language skills and fund  of knowledge are appropriate. There is no evidence of aphasia, agnosia, apraxia or anomia. Speech is clear with normal prosody and enunciation. Thought process is linear. Mood is normal and affect is normal.  Cranial nerves II - XII are as described above under HEENT exam. Motor exam: Normal bulk, strength and tone is noted. There is no drift, tremor or rebound. Romberg is negative. Reflexes are 1+ throughout. Fine motor skills and coordination: intact with normal finger taps, normal hand movements, normal rapid alternating patting, normal foot taps and normal foot agility.  Cerebellar testing: No dysmetria or intention tremor on finger to nose testing. Heel to shin is unremarkable bilaterally. There is no truncal or gait ataxia.  Sensory exam: intact to light touch in the upper and lower extremities.  Gait, station and balance: Manuel [name] stands easily. No veering to one side is noted. No leaning to one side is noted. Posture is age-appropriate and stance is narrow based. Gait shows normal stride length and normal pace. No problems turning are noted. Tandem walk is slightly difficult for him, unchanged.   Assessment and Plan:   In summary, Manuel [name] is a very pleasant 71 year old male with an underlying medical history of hypertension, type 2 diabetes, hyperlipidemia, left carotid bruit, arthritis, paroxysmal atrial fibrillation, status post cardioversion on 12/18/2011 and obesity, who presents for follow-up consultation of his OSA, well established on CPAP therapy. Manuel [name] had a home sleep test in October 2016 which showed severe obstructive sleep apnea and then returned  for a full night CPAP titration study on 07/03/2015. We have previously reviewed his test results in detail. We reviewed his most recent compliance data from the past 30 days. Manuel [name] is commended for his superb treatment adherence. Manuel [name] has lost weight and is working on it. Manuel [name] is encouraged to continue to work on weight management, blood pressure number looks better. Manuel [name] is advised to monitor his cough. If Manuel [name] feels worse such as productive cough, fever or shortness of breath Manuel [name] is advised to get checked out and call his primary care physician. Manuel [name] has some bibasilar crackles on auscultation today but has no additional symptoms. I suggested a one-year checkup, Manuel [name] can see one of our nurse practitioners. I answered all his questions today and Manuel [name] was in agreement. I spent 20 minutes in total face-to-face time with the patient, more than 50% of which was spent in counseling and coordination of care, reviewing test results, reviewing medication and discussing or reviewing the diagnosis of OSA, its prognosis and treatment options. Pertinent laboratory and imaging test results that were available during this visit with the patient were reviewed by me and considered in my medical decision making (see chart for details).

## 2017-02-28 NOTE — Patient Instructions (Addendum)
Monitor for cough, you have a few crackles at the lung basis, see you primary, if you feel worse with cough or fever or shortness of breath.   Please continue using your CPAP regularly. While your insurance requires that you use CPAP at least 4 hours each night on 70% of the nights, I recommend, that you not skip any nights and use it throughout the night if you can. Getting used to CPAP and staying with the treatment long term does take time and patience and discipline. Untreated obstructive sleep apnea when it is moderate to severe can have an adverse impact on cardiovascular health and raise her risk for heart disease, arrhythmias, hypertension, congestive heart failure, stroke and diabetes. Untreated obstructive sleep apnea causes sleep disruption, nonrestorative sleep, and sleep deprivation. This can have an impact on your day to day functioning and cause daytime sleepiness and impairment of cognitive function, memory loss, mood disturbance, and problems focussing. Using CPAP regularly can improve these symptoms.  Keep up the good work with your weight and your CPAP! We can see you in 1 year, you can see one of our nurse practitioners as you are stable. I will see you after that.

## 2017-03-15 DIAGNOSIS — G4733 Obstructive sleep apnea (adult) (pediatric): Secondary | ICD-10-CM | POA: Diagnosis not present

## 2017-03-15 DIAGNOSIS — I482 Chronic atrial fibrillation: Secondary | ICD-10-CM | POA: Diagnosis not present

## 2017-03-15 DIAGNOSIS — E7800 Pure hypercholesterolemia, unspecified: Secondary | ICD-10-CM | POA: Diagnosis not present

## 2017-03-15 DIAGNOSIS — E119 Type 2 diabetes mellitus without complications: Secondary | ICD-10-CM | POA: Diagnosis not present

## 2017-03-20 DIAGNOSIS — I129 Hypertensive chronic kidney disease with stage 1 through stage 4 chronic kidney disease, or unspecified chronic kidney disease: Secondary | ICD-10-CM | POA: Diagnosis not present

## 2017-03-20 DIAGNOSIS — I4891 Unspecified atrial fibrillation: Secondary | ICD-10-CM | POA: Diagnosis not present

## 2017-03-20 DIAGNOSIS — N182 Chronic kidney disease, stage 2 (mild): Secondary | ICD-10-CM | POA: Diagnosis not present

## 2017-03-20 DIAGNOSIS — R809 Proteinuria, unspecified: Secondary | ICD-10-CM | POA: Diagnosis not present

## 2017-03-20 DIAGNOSIS — E785 Hyperlipidemia, unspecified: Secondary | ICD-10-CM | POA: Diagnosis not present

## 2017-03-20 DIAGNOSIS — N183 Chronic kidney disease, stage 3 (moderate): Secondary | ICD-10-CM | POA: Diagnosis not present

## 2017-03-20 DIAGNOSIS — G4733 Obstructive sleep apnea (adult) (pediatric): Secondary | ICD-10-CM | POA: Diagnosis not present

## 2017-03-20 DIAGNOSIS — E1129 Type 2 diabetes mellitus with other diabetic kidney complication: Secondary | ICD-10-CM | POA: Diagnosis not present

## 2017-03-20 DIAGNOSIS — N2581 Secondary hyperparathyroidism of renal origin: Secondary | ICD-10-CM | POA: Diagnosis not present

## 2017-03-20 DIAGNOSIS — N400 Enlarged prostate without lower urinary tract symptoms: Secondary | ICD-10-CM | POA: Diagnosis not present

## 2017-03-20 DIAGNOSIS — D631 Anemia in chronic kidney disease: Secondary | ICD-10-CM | POA: Diagnosis not present

## 2017-03-26 DIAGNOSIS — Z7984 Long term (current) use of oral hypoglycemic drugs: Secondary | ICD-10-CM | POA: Diagnosis not present

## 2017-03-26 DIAGNOSIS — H26491 Other secondary cataract, right eye: Secondary | ICD-10-CM | POA: Diagnosis not present

## 2017-03-26 DIAGNOSIS — E119 Type 2 diabetes mellitus without complications: Secondary | ICD-10-CM | POA: Diagnosis not present

## 2017-03-26 DIAGNOSIS — Z961 Presence of intraocular lens: Secondary | ICD-10-CM | POA: Diagnosis not present

## 2017-04-01 DIAGNOSIS — G4733 Obstructive sleep apnea (adult) (pediatric): Secondary | ICD-10-CM | POA: Diagnosis not present

## 2017-04-29 DIAGNOSIS — G4733 Obstructive sleep apnea (adult) (pediatric): Secondary | ICD-10-CM | POA: Diagnosis not present

## 2017-05-02 DIAGNOSIS — G4733 Obstructive sleep apnea (adult) (pediatric): Secondary | ICD-10-CM | POA: Diagnosis not present

## 2017-05-06 DIAGNOSIS — Z23 Encounter for immunization: Secondary | ICD-10-CM | POA: Diagnosis not present

## 2017-06-03 DIAGNOSIS — G4733 Obstructive sleep apnea (adult) (pediatric): Secondary | ICD-10-CM | POA: Diagnosis not present

## 2017-07-04 DIAGNOSIS — G4733 Obstructive sleep apnea (adult) (pediatric): Secondary | ICD-10-CM | POA: Diagnosis not present

## 2017-08-05 DIAGNOSIS — G4733 Obstructive sleep apnea (adult) (pediatric): Secondary | ICD-10-CM | POA: Diagnosis not present

## 2017-08-13 DIAGNOSIS — D631 Anemia in chronic kidney disease: Secondary | ICD-10-CM | POA: Diagnosis not present

## 2017-08-13 DIAGNOSIS — R809 Proteinuria, unspecified: Secondary | ICD-10-CM | POA: Diagnosis not present

## 2017-08-13 DIAGNOSIS — G4733 Obstructive sleep apnea (adult) (pediatric): Secondary | ICD-10-CM | POA: Diagnosis not present

## 2017-08-13 DIAGNOSIS — I129 Hypertensive chronic kidney disease with stage 1 through stage 4 chronic kidney disease, or unspecified chronic kidney disease: Secondary | ICD-10-CM | POA: Diagnosis not present

## 2017-08-13 DIAGNOSIS — E785 Hyperlipidemia, unspecified: Secondary | ICD-10-CM | POA: Diagnosis not present

## 2017-08-13 DIAGNOSIS — N400 Enlarged prostate without lower urinary tract symptoms: Secondary | ICD-10-CM | POA: Diagnosis not present

## 2017-08-13 DIAGNOSIS — N2581 Secondary hyperparathyroidism of renal origin: Secondary | ICD-10-CM | POA: Diagnosis not present

## 2017-08-13 DIAGNOSIS — N182 Chronic kidney disease, stage 2 (mild): Secondary | ICD-10-CM | POA: Diagnosis not present

## 2017-08-13 DIAGNOSIS — I4891 Unspecified atrial fibrillation: Secondary | ICD-10-CM | POA: Diagnosis not present

## 2017-08-13 DIAGNOSIS — E1122 Type 2 diabetes mellitus with diabetic chronic kidney disease: Secondary | ICD-10-CM | POA: Diagnosis not present

## 2017-08-15 DIAGNOSIS — I10 Essential (primary) hypertension: Secondary | ICD-10-CM | POA: Diagnosis not present

## 2017-08-15 DIAGNOSIS — E119 Type 2 diabetes mellitus without complications: Secondary | ICD-10-CM | POA: Diagnosis not present

## 2017-08-20 DIAGNOSIS — E7800 Pure hypercholesterolemia, unspecified: Secondary | ICD-10-CM | POA: Diagnosis not present

## 2017-08-20 DIAGNOSIS — I10 Essential (primary) hypertension: Secondary | ICD-10-CM | POA: Diagnosis not present

## 2017-08-20 DIAGNOSIS — I480 Paroxysmal atrial fibrillation: Secondary | ICD-10-CM | POA: Diagnosis not present

## 2017-08-20 DIAGNOSIS — E119 Type 2 diabetes mellitus without complications: Secondary | ICD-10-CM | POA: Diagnosis not present

## 2017-08-20 DIAGNOSIS — L723 Sebaceous cyst: Secondary | ICD-10-CM | POA: Diagnosis not present

## 2017-09-05 DIAGNOSIS — G4733 Obstructive sleep apnea (adult) (pediatric): Secondary | ICD-10-CM | POA: Diagnosis not present

## 2017-09-09 DIAGNOSIS — R222 Localized swelling, mass and lump, trunk: Secondary | ICD-10-CM | POA: Diagnosis not present

## 2017-09-20 HISTORY — PX: OTHER SURGICAL HISTORY: SHX169

## 2017-10-07 DIAGNOSIS — G4733 Obstructive sleep apnea (adult) (pediatric): Secondary | ICD-10-CM | POA: Diagnosis not present

## 2017-10-15 DIAGNOSIS — L723 Sebaceous cyst: Secondary | ICD-10-CM | POA: Diagnosis not present

## 2017-11-07 DIAGNOSIS — G4733 Obstructive sleep apnea (adult) (pediatric): Secondary | ICD-10-CM | POA: Diagnosis not present

## 2017-12-09 DIAGNOSIS — G4733 Obstructive sleep apnea (adult) (pediatric): Secondary | ICD-10-CM | POA: Diagnosis not present

## 2017-12-10 DIAGNOSIS — I10 Essential (primary) hypertension: Secondary | ICD-10-CM | POA: Diagnosis not present

## 2017-12-10 DIAGNOSIS — R140 Abdominal distension (gaseous): Secondary | ICD-10-CM | POA: Diagnosis not present

## 2017-12-10 DIAGNOSIS — E119 Type 2 diabetes mellitus without complications: Secondary | ICD-10-CM | POA: Diagnosis not present

## 2017-12-10 DIAGNOSIS — E7800 Pure hypercholesterolemia, unspecified: Secondary | ICD-10-CM | POA: Diagnosis not present

## 2017-12-10 DIAGNOSIS — R109 Unspecified abdominal pain: Secondary | ICD-10-CM | POA: Diagnosis not present

## 2017-12-11 ENCOUNTER — Other Ambulatory Visit: Payer: Self-pay | Admitting: Internal Medicine

## 2017-12-11 ENCOUNTER — Ambulatory Visit
Admission: RE | Admit: 2017-12-11 | Discharge: 2017-12-11 | Disposition: A | Discharge status: Home / Self Care | Payer: Medicare HMO | Source: Ambulatory Visit | Attending: Internal Medicine | Admitting: Internal Medicine

## 2017-12-11 DIAGNOSIS — K5730 Diverticulosis of large intestine without perforation or abscess without bleeding: Secondary | ICD-10-CM | POA: Diagnosis not present

## 2017-12-11 DIAGNOSIS — R1084 Generalized abdominal pain: Secondary | ICD-10-CM

## 2017-12-11 MED ORDER — IOPAMIDOL (ISOVUE-300) INJECTION 61%
125.0000 mL | Freq: Once | INTRAVENOUS | Status: AC | PRN
  Administered 2017-12-11: 125 mL via INTRAVENOUS

## 2017-12-12 ENCOUNTER — Encounter (HOSPITAL_BASED_OUTPATIENT_CLINIC_OR_DEPARTMENT_OTHER): Payer: Self-pay

## 2017-12-12 ENCOUNTER — Other Ambulatory Visit: Payer: Self-pay

## 2017-12-12 ENCOUNTER — Inpatient Hospital Stay (HOSPITAL_BASED_OUTPATIENT_CLINIC_OR_DEPARTMENT_OTHER)
Admission: EM | Admit: 2017-12-12 | Discharge: 2017-12-23 | DRG: 329 | Disposition: A | Discharge status: Home Health | Payer: Medicare HMO | Attending: Internal Medicine | Admitting: Internal Medicine

## 2017-12-12 DIAGNOSIS — I482 Chronic atrial fibrillation: Secondary | ICD-10-CM | POA: Diagnosis not present

## 2017-12-12 DIAGNOSIS — I10 Essential (primary) hypertension: Secondary | ICD-10-CM | POA: Diagnosis not present

## 2017-12-12 DIAGNOSIS — K922 Gastrointestinal hemorrhage, unspecified: Secondary | ICD-10-CM | POA: Diagnosis not present

## 2017-12-12 DIAGNOSIS — Z79899 Other long term (current) drug therapy: Secondary | ICD-10-CM

## 2017-12-12 DIAGNOSIS — R000 Tachycardia, unspecified: Secondary | ICD-10-CM | POA: Diagnosis not present

## 2017-12-12 DIAGNOSIS — E785 Hyperlipidemia, unspecified: Secondary | ICD-10-CM | POA: Diagnosis present

## 2017-12-12 DIAGNOSIS — R0902 Hypoxemia: Secondary | ICD-10-CM

## 2017-12-12 DIAGNOSIS — N400 Enlarged prostate without lower urinary tract symptoms: Secondary | ICD-10-CM

## 2017-12-12 DIAGNOSIS — K921 Melena: Secondary | ICD-10-CM | POA: Diagnosis not present

## 2017-12-12 DIAGNOSIS — R140 Abdominal distension (gaseous): Secondary | ICD-10-CM

## 2017-12-12 DIAGNOSIS — E538 Deficiency of other specified B group vitamins: Secondary | ICD-10-CM | POA: Diagnosis not present

## 2017-12-12 DIAGNOSIS — K229 Disease of esophagus, unspecified: Secondary | ICD-10-CM | POA: Diagnosis not present

## 2017-12-12 DIAGNOSIS — R7981 Abnormal blood-gas level: Secondary | ICD-10-CM

## 2017-12-12 DIAGNOSIS — Z7901 Long term (current) use of anticoagulants: Secondary | ICD-10-CM

## 2017-12-12 DIAGNOSIS — K5900 Constipation, unspecified: Secondary | ICD-10-CM

## 2017-12-12 DIAGNOSIS — K269 Duodenal ulcer, unspecified as acute or chronic, without hemorrhage or perforation: Secondary | ICD-10-CM | POA: Diagnosis present

## 2017-12-12 DIAGNOSIS — I9788 Other intraoperative complications of the circulatory system, not elsewhere classified: Secondary | ICD-10-CM | POA: Diagnosis not present

## 2017-12-12 DIAGNOSIS — Z87891 Personal history of nicotine dependence: Secondary | ICD-10-CM | POA: Diagnosis not present

## 2017-12-12 DIAGNOSIS — Z882 Allergy status to sulfonamides status: Secondary | ICD-10-CM | POA: Diagnosis not present

## 2017-12-12 DIAGNOSIS — K5939 Other megacolon: Secondary | ICD-10-CM | POA: Diagnosis not present

## 2017-12-12 DIAGNOSIS — K56699 Other intestinal obstruction unspecified as to partial versus complete obstruction: Secondary | ICD-10-CM | POA: Diagnosis not present

## 2017-12-12 DIAGNOSIS — B3781 Candidal esophagitis: Secondary | ICD-10-CM | POA: Diagnosis not present

## 2017-12-12 DIAGNOSIS — D62 Acute posthemorrhagic anemia: Secondary | ICD-10-CM | POA: Diagnosis not present

## 2017-12-12 DIAGNOSIS — K259 Gastric ulcer, unspecified as acute or chronic, without hemorrhage or perforation: Secondary | ICD-10-CM | POA: Diagnosis not present

## 2017-12-12 DIAGNOSIS — I9589 Other hypotension: Secondary | ICD-10-CM | POA: Diagnosis not present

## 2017-12-12 DIAGNOSIS — E119 Type 2 diabetes mellitus without complications: Secondary | ICD-10-CM

## 2017-12-12 DIAGNOSIS — K6389 Other specified diseases of intestine: Secondary | ICD-10-CM | POA: Diagnosis not present

## 2017-12-12 DIAGNOSIS — J9811 Atelectasis: Secondary | ICD-10-CM | POA: Diagnosis not present

## 2017-12-12 DIAGNOSIS — G4733 Obstructive sleep apnea (adult) (pediatric): Secondary | ICD-10-CM | POA: Diagnosis not present

## 2017-12-12 DIAGNOSIS — I4891 Unspecified atrial fibrillation: Secondary | ICD-10-CM | POA: Diagnosis not present

## 2017-12-12 DIAGNOSIS — Z7984 Long term (current) use of oral hypoglycemic drugs: Secondary | ICD-10-CM | POA: Diagnosis not present

## 2017-12-12 DIAGNOSIS — E876 Hypokalemia: Secondary | ICD-10-CM | POA: Diagnosis not present

## 2017-12-12 DIAGNOSIS — J9601 Acute respiratory failure with hypoxia: Secondary | ICD-10-CM | POA: Diagnosis not present

## 2017-12-12 DIAGNOSIS — E118 Type 2 diabetes mellitus with unspecified complications: Secondary | ICD-10-CM | POA: Diagnosis not present

## 2017-12-12 LAB — COMPREHENSIVE METABOLIC PANEL
ALK PHOS: 70 U/L (ref 38–126)
ALT: 15 U/L — ABNORMAL LOW (ref 17–63)
AST: 17 U/L (ref 15–41)
Albumin: 3.3 g/dL — ABNORMAL LOW (ref 3.5–5.0)
Anion gap: 10 (ref 5–15)
BILIRUBIN TOTAL: 1.4 mg/dL — AB (ref 0.3–1.2)
BUN: 27 mg/dL — AB (ref 6–20)
CALCIUM: 8.3 mg/dL — AB (ref 8.9–10.3)
CO2: 22 mmol/L (ref 22–32)
Chloride: 104 mmol/L (ref 101–111)
Creatinine, Ser: 1.23 mg/dL (ref 0.61–1.24)
GFR calc Af Amer: >60 mL/min (ref >60)
GFR, EST NON AFRICAN AMERICAN: 57 mL/min — AB (ref >60)
Glucose, Bld: 85 mg/dL (ref 65–99)
POTASSIUM: 3.7 mmol/L (ref 3.5–5.1)
Sodium: 136 mmol/L (ref 135–145)
TOTAL PROTEIN: 6.4 g/dL — AB (ref 6.5–8.1)

## 2017-12-12 LAB — CBC WITH DIFFERENTIAL/PLATELET
BASOS ABS: 0 10*3/uL (ref 0.0–0.1)
BASOS PCT: 0 %
EOS ABS: 0.1 10*3/uL (ref 0.0–0.7)
EOS PCT: 1 %
HCT: 45.4 % (ref 39.0–52.0)
Hemoglobin: 15.3 g/dL (ref 13.0–17.0)
Lymphocytes Relative: 15 %
Lymphs Abs: 0.8 10*3/uL (ref 0.7–4.0)
MCH: 29.7 pg (ref 26.0–34.0)
MCHC: 33.7 g/dL (ref 30.0–36.0)
MCV: 88 fL (ref 78.0–100.0)
MONO ABS: 0.5 10*3/uL (ref 0.1–1.0)
Monocytes Relative: 9 %
NEUTROS ABS: 3.9 10*3/uL (ref 1.7–7.7)
Neutrophils Relative %: 75 %
PLATELETS: 209 10*3/uL (ref 150–400)
RBC: 5.16 MIL/uL (ref 4.22–5.81)
RDW: 15.4 % (ref 11.5–15.5)
WBC: 5.2 10*3/uL (ref 4.0–10.5)

## 2017-12-12 LAB — URINALYSIS, ROUTINE W REFLEX MICROSCOPIC
GLUCOSE, UA: NEGATIVE mg/dL
HGB URINE DIPSTICK: NEGATIVE
KETONES UR: NEGATIVE mg/dL
Leukocytes, UA: NEGATIVE
Nitrite: NEGATIVE
PH: 5.5 (ref 5.0–8.0)
PROTEIN: 100 mg/dL — AB
Specific Gravity, Urine: >1.03 — ABNORMAL HIGH (ref 1.005–1.030)

## 2017-12-12 LAB — LIPASE, BLOOD: LIPASE: 21 U/L (ref 11–51)

## 2017-12-12 LAB — I-STAT CG4 LACTIC ACID, ED
LACTIC ACID, VENOUS: 0.84 mmol/L (ref 0.5–1.9)
LACTIC ACID, VENOUS: 1.36 mmol/L (ref 0.5–1.9)
Lactic Acid, Venous: 1.49 mmol/L (ref 0.5–1.9)

## 2017-12-12 LAB — PROTIME-INR
INR: 1.35
PROTHROMBIN TIME: 16.6 s — AB (ref 11.4–15.2)

## 2017-12-12 LAB — URINALYSIS, MICROSCOPIC (REFLEX)

## 2017-12-12 LAB — GLUCOSE, CAPILLARY: Glucose-Capillary: 81 mg/dL (ref 65–99)

## 2017-12-12 MED ORDER — PRAVASTATIN SODIUM 20 MG PO TABS
20.0000 mg | ORAL_TABLET | Freq: Every day | ORAL | Status: DC
  Administered 2017-12-13 – 2017-12-23 (×11): 20 mg via ORAL
  Filled 2017-12-12 (×11): qty 1

## 2017-12-12 MED ORDER — INSULIN ASPART 100 UNIT/ML ~~LOC~~ SOLN
0.0000 [IU] | Freq: Three times a day (TID) | SUBCUTANEOUS | Status: DC
  Administered 2017-12-14: 1 [IU] via SUBCUTANEOUS
  Administered 2017-12-14: 2 [IU] via SUBCUTANEOUS
  Administered 2017-12-15 – 2017-12-18 (×8): 1 [IU] via SUBCUTANEOUS
  Administered 2017-12-19: 0 [IU] via SUBCUTANEOUS
  Administered 2017-12-21 – 2017-12-22 (×2): 2 [IU] via SUBCUTANEOUS
  Administered 2017-12-22 – 2017-12-23 (×2): 1 [IU] via SUBCUTANEOUS
  Administered 2017-12-23: 3 [IU] via SUBCUTANEOUS

## 2017-12-12 MED ORDER — DOXAZOSIN MESYLATE 1 MG PO TABS
8.0000 mg | ORAL_TABLET | Freq: Every day | ORAL | Status: DC
  Administered 2017-12-13: 8 mg via ORAL
  Filled 2017-12-12: qty 2

## 2017-12-12 MED ORDER — SODIUM CHLORIDE 0.9 % IV SOLN: Freq: Once | INTRAVENOUS | Status: DC

## 2017-12-12 MED ORDER — DILTIAZEM HCL ER COATED BEADS 120 MG PO CP24
120.0000 mg | ORAL_CAPSULE | Freq: Every day | ORAL | Status: DC
  Administered 2017-12-13: 120 mg via ORAL
  Filled 2017-12-12: qty 1

## 2017-12-12 MED ORDER — METRONIDAZOLE IN NACL 5-0.79 MG/ML-% IV SOLN
500.0000 mg | Freq: Three times a day (TID) | INTRAVENOUS | Status: DC
  Administered 2017-12-12 – 2017-12-13 (×2): 500 mg via INTRAVENOUS
  Filled 2017-12-12 (×3): qty 100

## 2017-12-12 MED ORDER — POTASSIUM CHLORIDE IN NACL 20-0.9 MEQ/L-% IV SOLN
INTRAVENOUS | Status: AC
  Administered 2017-12-13: via INTRAVENOUS
  Filled 2017-12-12 (×2): qty 1000

## 2017-12-12 MED ORDER — ONDANSETRON HCL 4 MG PO TABS: 4.0000 mg | ORAL_TABLET | Freq: Four times a day (QID) | ORAL | Status: DC | PRN

## 2017-12-12 MED ORDER — MAGNESIUM HYDROXIDE 400 MG/5ML PO SUSP
960.0000 mL | Freq: Once | ORAL | Status: AC
  Administered 2017-12-12: 960 mL via RECTAL
  Filled 2017-12-12: qty 473

## 2017-12-12 MED ORDER — ONDANSETRON HCL 4 MG/2ML IJ SOLN
4.0000 mg | Freq: Four times a day (QID) | INTRAMUSCULAR | Status: DC | PRN
Start: 2017-12-12 — End: 2017-12-23
  Filled 2017-12-12: qty 2

## 2017-12-12 NOTE — ED Notes (Signed)
ED TO INPATIENT HANDOFF REPORT  Name/Age/Gender Manuel [name] 72 y.o. male  Code Status Advance Directive Documentation     Most Recent Value  Type of Advance Directive  Healthcare Power of Attorney  Pre-existing out of facility DNR order (yellow form or pink MOST form)  -  "MOST" Form in Place?  -      Home/SNF/Other Home  Chief Complaint Constipation  Level of Care/Admitting Diagnosis ED Disposition    ED Disposition Condition [place]: [place] [100102]  Level of Care: Telemetry [5]  Admit to tele based on following criteria: Complex arrhythmia (Bradycardia/Tachycardia)  Diagnosis: Atrial fibrillation ([place]) [427.31.ICD-9-CM]  Admitting Physician: Bethena Roys [[id number]]  Attending Physician: Bethena Roys [phone]  PT Class (Do Not Modify): Observation [104]  PT Acc Code (Do Not Modify): Observation [10022]       Medical History Past Medical History:  Diagnosis Date  . A-fib ([place])   . BPH (benign prostatic hyperplasia)   . Diabetes mellitus   . Dizziness and giddiness   . Dyslipidemia   . Hypertension   . Left carotid bruit   . OSA (obstructive sleep apnea)   . S/P colonoscopy     Allergies Allergies  Allergen Reactions  . Sulfa Antibiotics Other (See Comments)    Headaches     IV Location/Drains/Wounds Patient Lines/Drains/Airways Status   Active Line/Drains/Airways    Name:   Placement date:   Placement time:   Site:   Days:   Peripheral IV 12/12/17 Right Forearm   12/12/17    1333    Forearm   less than 1          Labs/Imaging Results for orders placed or performed during the hospital encounter of 12/12/17 (from the past 48 hour(s))  Comprehensive metabolic panel     Status: Abnormal   Collection Time: 12/12/17  1:35 PM  Result Value Ref Range   Sodium 136 135 - 145 mmol/L   Potassium 3.7 3.5 - 5.1 mmol/L   Chloride 104 101 - 111 mmol/L   CO2 22 22 - 32 mmol/L   Glucose, Bld 85  65 - 99 mg/dL   BUN 27 (H) 6 - 20 mg/dL   Creatinine, Ser 1.23 0.61 - 1.24 mg/dL   Calcium 8.3 (L) 8.9 - 10.3 mg/dL   Total Protein 6.4 (L) 6.5 - 8.1 g/dL   Albumin 3.3 (L) 3.5 - 5.0 g/dL   AST 17 15 - 41 U/L   ALT 15 (L) 17 - 63 U/L   Alkaline Phosphatase 70 38 - 126 U/L   Total Bilirubin 1.4 (H) 0.3 - 1.2 mg/dL   GFR calc non Af Amer 57 (L) >60 mL/min   GFR calc Af Amer >60 >60 mL/min    Comment: (NOTE) The eGFR has been calculated using the CKD EPI equation. This calculation has not been validated in all clinical situations. eGFR's persistently <60 mL/min signify possible Chronic Kidney Disease.    Anion gap 10 5 - 15    Comment: Performed at [facility], [address] [place], [place] [address]  Lipase, blood     Status: None   Collection Time: 12/12/17  1:35 PM  Result Value Ref Range   Lipase 21 11 - 51 U/L    Comment: Performed at [facility], [address] [place], [place] [address]  CBC with Differential     Status: None   Collection  Time: 12/12/17  1:35 PM  Result Value Ref Range   WBC 5.2 4.0 - 10.5 K/uL   RBC 5.16 4.22 - 5.81 MIL/uL   Hemoglobin 15.3 13.0 - 17.0 g/dL   HCT 45.4 39.0 - 52.0 %   MCV 88.0 78.0 - 100.0 fL   MCH 29.7 26.0 - 34.0 pg   MCHC 33.7 30.0 - 36.0 g/dL   RDW 15.4 11.5 - 15.5 %   Platelets 209 150 - 400 K/uL   Neutrophils Relative % 75 %   Neutro Abs 3.9 1.7 - 7.7 K/uL   Lymphocytes Relative 15 %   Lymphs Abs 0.8 0.7 - 4.0 K/uL   Monocytes Relative 9 %   Monocytes Absolute 0.5 0.1 - 1.0 K/uL   Eosinophils Relative 1 %   Eosinophils Absolute 0.1 0.0 - 0.7 K/uL   Basophils Relative 0 %   Basophils Absolute 0.0 0.0 - 0.1 K/uL    Comment: Performed at [facility], [address] [place], [place] [address]  Protime-INR     Status: Abnormal   Collection Time: 12/12/17  1:35 PM  Result Value Ref Range   Prothrombin Time 16.6 (H) 11.4 - 15.2 seconds   INR 1.35     Comment: Performed at [facility], [address] [place], [place] [address]  Urinalysis, Routine w reflex microscopic     Status: Abnormal   Collection Time: 12/12/17  1:35 PM  Result Value Ref Range   Color, Urine YELLOW YELLOW   APPearance CLEAR CLEAR   Specific Gravity, Urine >1.030 (H) 1.005 - 1.030   pH 5.5 5.0 - 8.0   Glucose, UA NEGATIVE NEGATIVE mg/dL   Hgb urine dipstick NEGATIVE NEGATIVE   Bilirubin Urine SMALL (A) NEGATIVE   Ketones, ur NEGATIVE NEGATIVE mg/dL   Protein, ur 100 (A) NEGATIVE mg/dL   Nitrite NEGATIVE NEGATIVE   Leukocytes, UA NEGATIVE NEGATIVE    Comment: Performed at [facility], [address] [place], [place] [address]  Urinalysis, Microscopic (reflex)     Status: Abnormal   Collection Time: 12/12/17  1:35 PM  Result Value Ref Range   RBC / HPF 0-5 0 - 5 RBC/hpf   WBC, UA 0-5 0 - 5 WBC/hpf   Bacteria, UA FEW (A) NONE SEEN   Squamous Epithelial / LPF 0-5 0 - 5   Hyaline Casts, UA PRESENT    Granular Casts, UA PRESENT     Comment: Performed at [facility], [address] [place], [place] [address]  I-Stat CG4 Lactic Acid, ED     Status: None   Collection Time: 12/12/17  1:43 PM  Result Value Ref Range   Lactic Acid, Venous 0.84 0.5 - 1.9 mmol/L  I-Stat CG4 Lactic Acid, ED     Status: None   Collection Time: 12/12/17  4:03 PM  Result Value Ref Range   Lactic Acid, Venous 1.49 0.5 - 1.9 mmol/L  I-Stat CG4 Lactic Acid, ED     Status: None   Collection Time: 12/12/17  6:35 PM  Result Value Ref Range   Lactic Acid, Venous 1.36 0.5 - 1.9 mmol/L   Ct Abdomen Pelvis W Contrast  Result Date: 12/11/2017 CLINICAL DATA:  Left lower quadrant and mid abdominal pain. Constipation. EXAM: CT ABDOMEN AND PELVIS WITH CONTRAST TECHNIQUE: Multidetector CT imaging of the abdomen and pelvis was performed using the standard protocol following bolus administration of intravenous contrast. CONTRAST:  163m ISOVUE-300  IOPAMIDOL (ISOVUE-300) INJECTION 61% COMPARISON:  None.  FINDINGS: Lower chest: Subsegmental atelectasis or scarring in the lung bases. Cardiomegaly. No effusions. Hepatobiliary: No focal hepatic abnormality. Gallbladder unremarkable. Pancreas: No focal abnormality or ductal dilatation. Spleen: No focal abnormality.  Normal size. Adrenals/Urinary Tract: Small nodule in the left adrenal gland measures 15 mm, nonspecific. Absence of the left kidney. Right kidney is unremarkable. No hydronephrosis. Urinary bladder unremarkable. Stomach/Bowel: There is marked gaseous distention of the colon with large amount of stool and gas in the colon to the level of the mid sigmoid colon. There is pneumatosis within the wall of the cecum and ascending colon several small bowel loops are dilated and fluid-filled as well. Stomach is decompressed, unremarkable. Sigmoid diverticulosis. No active diverticulitis. Vascular/Lymphatic: No evidence of aneurysm or adenopathy. Mesenteric vessels are widely patent. Reproductive: No visible focal abnormality. Other: Small amount of free fluid in the pelvis and right paracolic gutter. Musculoskeletal: Diffuse degenerative changes in the lumbar spine. IMPRESSION: Marked gaseous distention of the colon to the level of the mid sigmoid colon where there is sigmoid diverticulosis and mild wall thickening which could be related to diverticulosis and muscular hypertrophy or could reflect an annular colonic lesion. In addition, there is pneumatosis within the right colon wall. Large stool burden in the right colon, transverse colon and descending colon. Small bowel loops are also mild to moderately dilated. Absence of the left kidney. Small left adrenal nodule, 15 mm, nonspecific. Small amount of free fluid in the right lower quadrant and cul-de-sac. These results were called by telephone at the time of interpretation on 12/11/2017 at 1:55 pm to Dr. Jani Gravel , who verbally acknowledged these results. Electronically Signed   By: Rolm Baptise M.D.   On:  12/11/2017 13:53    Pending Labs Unresulted Labs (From admission, onward)   Start     Ordered   12/12/17 1325  Occult blood card to lab, stool  Once,   STAT     12/12/17 1324   Signed and Held  Basic metabolic panel  Tomorrow morning,   R     Signed and Held   Signed and Held  CBC  Tomorrow morning,   R     Signed and Held      Vitals/Pain Today's Vitals   12/12/17 1233 12/12/17 1441 12/12/17 1724 12/12/17 2031  BP: 108/84 (!) 115/92 135/88 130/88  Pulse: 84 88 88 90  Resp:  _0 Temp:   98.1 F (36.7 C)   TempSrc:   Oral   SpO2:  98% 90% 98%  Weight:      Height:      PainSc:        Isolation Precautions No active isolations  Medications Medications  0.9 %  sodium chloride infusion (has no administration in time range)  metroNIDAZOLE (FLAGYL) IVPB 500 mg (has no administration in time range)  sorbitol, milk of mag, mineral oil, glycerin (SMOG) enema (0 mLs Rectal Hold 12/12/17 1900)    Mobility walks

## 2017-12-12 NOTE — ED Triage Notes (Signed)
C/o constipation x 6 days-nausea no vomiting-was seen by PCP this week-had "xrays and a CT scan-didn't show a blockage"-advised to use enemas and given po meds-pt reports no results-NAD-steady gait

## 2017-12-12 NOTE — Consult Note (Signed)
Reason for Consult:constipation Referring Physician: Dr. Justice [name] is an 72 y.o. male.  HPI: The patient is a 72 year old black male who presents with several weeks of constipation.  He has been having some abdominal distention.  He complains of some crampy abdominal pain that comes and goes.  He went to the emergency department for this since it did not resolve and on CT scan he was found to have some evidence of pneumatosis of the right colon he also had some mild thickening of the left colon.  He denies any fevers or chills.  His white count is normal.  He denies any significant abdominal pain during the interview.  Past Medical History:  Diagnosis Date  . A-fib ([place])   . BPH (benign prostatic hyperplasia)   . Diabetes mellitus   . Dizziness and giddiness   . Dyslipidemia   . Hypertension   . Left carotid bruit   . OSA (obstructive sleep apnea)   . [name]/P colonoscopy     Past Surgical History:  Procedure Laterality Date  . CARDIOVERSION  12/18/2011   Procedure: CARDIOVERSION;  Surgeon: Laverda Page, MD;  Location: [facility];  Service: Cardiovascular;  Laterality: N/A;  . CYSTOSTOMY     off back  . HERNIA REPAIR    . 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 Cancer Father   . Alzheimer'[name] disease Father   . Cancer Mother   . Kidney disease Paternal Aunt     Social History:  reports that he has quit smoking. He has never used smokeless tobacco. He reports that he does not drink alcohol or use drugs.  Allergies:  Allergies  Allergen Reactions  . Sulfa Antibiotics Other (See Comments)    Headaches     Medications: I have reviewed the patient'[name] current medications.  Results for orders placed or performed during the hospital encounter of 12/12/17 (from the past 48 hour([name]))  Comprehensive metabolic panel     Status: Abnormal   Collection Time: 12/12/17  1:35 PM  Result Value Ref Range   Sodium  136 135 - 145 mmol/L   Potassium 3.7 3.5 - 5.1 mmol/L   Chloride 104 101 - 111 mmol/L   CO2 22 22 - 32 mmol/L   Glucose, Bld 85 65 - 99 mg/dL   BUN 27 (H) 6 - 20 mg/dL   Creatinine, Ser 1.23 0.61 - 1.24 mg/dL   Calcium 8.3 (L) 8.9 - 10.3 mg/dL   Total Protein 6.4 (L) 6.5 - 8.1 g/dL   Albumin 3.3 (L) 3.5 - 5.0 g/dL   AST 17 15 - 41 U/L   ALT 15 (L) 17 - 63 U/L   Alkaline Phosphatase 70 38 - 126 U/L   Total Bilirubin 1.4 (H) 0.3 - 1.2 mg/dL   GFR calc non Af Amer 57 (L) >60 mL/min   GFR calc Af Amer >60 >60 mL/min    Comment: (NOTE) The eGFR has been calculated using the CKD EPI equation. This calculation has not been validated in all clinical situations. eGFR'[name] persistently <60 mL/min signify possible Chronic Kidney Disease.    Anion gap 10 5 - 15    Comment: Performed at [facility], [address] [place], [place] [address]  Lipase, blood     Status: None   Collection Time: 12/12/17  1:35 PM  Result Value Ref Range   Lipase  21 11 - 51 U/L    Comment: Performed at [facility], [address] [place], [place] [address]  CBC with Differential     Status: None   Collection Time: 12/12/17  1:35 PM  Result Value Ref Range   WBC 5.2 4.0 - 10.5 K/uL   RBC 5.16 4.22 - 5.81 MIL/uL   Hemoglobin 15.3 13.0 - 17.0 g/dL   HCT 45.4 39.0 - 52.0 %   MCV 88.0 78.0 - 100.0 fL   MCH 29.7 26.0 - 34.0 pg   MCHC 33.7 30.0 - 36.0 g/dL   RDW 15.4 11.5 - 15.5 %   Platelets 209 150 - 400 K/uL   Neutrophils Relative % 75 %   Neutro Abs 3.9 1.7 - 7.7 K/uL   Lymphocytes Relative 15 %   Lymphs Abs 0.8 0.7 - 4.0 K/uL   Monocytes Relative 9 %   Monocytes Absolute 0.5 0.1 - 1.0 K/uL   Eosinophils Relative 1 %   Eosinophils Absolute 0.1 0.0 - 0.7 K/uL   Basophils Relative 0 %   Basophils Absolute 0.0 0.0 - 0.1 K/uL    Comment: Performed at [facility], [address] [place], [place] [address]  Protime-INR     Status: Abnormal   Collection Time: 12/12/17  1:35  PM  Result Value Ref Range   Prothrombin Time 16.6 (H) 11.4 - 15.2 seconds   INR 1.35     Comment: Performed at [facility], [address] [place], [place] [address]  Urinalysis, Routine w reflex microscopic     Status: Abnormal   Collection Time: 12/12/17  1:35 PM  Result Value Ref Range   Color, Urine YELLOW YELLOW   APPearance CLEAR CLEAR   Specific Gravity, Urine >1.030 (H) 1.005 - 1.030   pH 5.5 5.0 - 8.0   Glucose, UA NEGATIVE NEGATIVE mg/dL   Hgb urine dipstick NEGATIVE NEGATIVE   Bilirubin Urine SMALL (A) NEGATIVE   Ketones, ur NEGATIVE NEGATIVE mg/dL   Protein, ur 100 (A) NEGATIVE mg/dL   Nitrite NEGATIVE NEGATIVE   Leukocytes, UA NEGATIVE NEGATIVE    Comment: Performed at [facility], [address] [place], [place] [address]  Urinalysis, Microscopic (reflex)     Status: Abnormal   Collection Time: 12/12/17  1:35 PM  Result Value Ref Range   RBC / HPF 0-5 0 - 5 RBC/hpf   WBC, UA 0-5 0 - 5 WBC/hpf   Bacteria, UA FEW (A) NONE SEEN   Squamous Epithelial / LPF 0-5 0 - 5   Hyaline Casts, UA PRESENT    Granular Casts, UA PRESENT     Comment: Performed at [facility], [address] [place], [place] [address]  I-Stat CG4 Lactic Acid, ED     Status: None   Collection Time: 12/12/17  1:43 PM  Result Value Ref Range   Lactic Acid, Venous 0.84 0.5 - 1.9 mmol/L  I-Stat CG4 Lactic Acid, ED     Status: None   Collection Time: 12/12/17  4:03 PM  Result Value Ref Range   Lactic Acid, Venous 1.49 0.5 - 1.9 mmol/L  I-Stat CG4 Lactic Acid, ED     Status: None   Collection Time: 12/12/17  6:35 PM  Result Value Ref Range   Lactic Acid, Venous 1.36 0.5 - 1.9 mmol/L    Ct Abdomen Pelvis W Contrast  Result Date: 12/11/2017 CLINICAL DATA:  Left lower quadrant  and mid abdominal pain. Constipation. EXAM: CT ABDOMEN AND PELVIS WITH CONTRAST TECHNIQUE: Multidetector CT imaging of the abdomen and pelvis was performed using the standard protocol  following bolus administration of intravenous contrast. CONTRAST:  142m ISOVUE-300 IOPAMIDOL (ISOVUE-300) INJECTION 61% COMPARISON:  None. FINDINGS: Lower chest: Subsegmental atelectasis or scarring in the lung bases. Cardiomegaly. No effusions. Hepatobiliary: No focal hepatic abnormality. Gallbladder unremarkable. Pancreas: No focal abnormality or ductal dilatation. Spleen: No focal abnormality.  Normal size. Adrenals/Urinary Tract: Small nodule in the left adrenal gland measures 15 mm, nonspecific. Absence of the left kidney. Right kidney is unremarkable. No hydronephrosis. Urinary bladder unremarkable. Stomach/Bowel: There is marked gaseous distention of the colon with large amount of stool and gas in the colon to the level of the mid sigmoid colon. There is pneumatosis within the wall of the cecum and ascending colon several small bowel loops are dilated and fluid-filled as well. Stomach is decompressed, unremarkable. Sigmoid diverticulosis. No active diverticulitis. Vascular/Lymphatic: No evidence of aneurysm or adenopathy. Mesenteric vessels are widely patent. Reproductive: No visible focal abnormality. Other: Small amount of free fluid in the pelvis and right paracolic gutter. Musculoskeletal: Diffuse degenerative changes in the lumbar spine. IMPRESSION: Marked gaseous distention of the colon to the level of the mid sigmoid colon where there is sigmoid diverticulosis and mild wall thickening which could be related to diverticulosis and muscular hypertrophy or could reflect an annular colonic lesion. In addition, there is pneumatosis within the right colon wall. Large stool burden in the right colon, transverse colon and descending colon. Small bowel loops are also mild to moderately dilated. Absence of the left kidney. Small left adrenal nodule, 15 mm, nonspecific. Small amount of free fluid in the right lower quadrant and cul-de-sac. These results were called by telephone at the time of interpretation on  12/11/2017 at 1:55 pm to Dr. JJani Gravel, who verbally acknowledged these results. Electronically Signed   By: KRolm BaptiseM.D.   On: 12/11/2017 13:53    Review of Systems  Constitutional: Negative.   HENT: Negative.   Eyes: Negative.   Respiratory: Negative.   Cardiovascular: Negative.   Gastrointestinal: Positive for constipation.  Genitourinary: Negative.   Musculoskeletal: Negative.   Skin: Negative.   Neurological: Negative.   Endo/Heme/Allergies: Negative.   Psychiatric/Behavioral: Negative.    Blood pressure 135/88, pulse 88, temperature 98.1 F (36.7 C), temperature source Oral, resp. rate 14, height _0  (1.905 m), weight 100.7 kg (222 lb), SpO2 90 %. Physical Exam  Constitutional: He is oriented to person, place, and time. He appears well-developed and well-nourished. No distress.  HENT:  Head: Normocephalic and atraumatic.  Mouth/Throat: No oropharyngeal exudate.  Eyes: Pupils are equal, round, and reactive to light. Conjunctivae and EOM are normal.  Neck: Normal range of motion. Neck supple.  Cardiovascular: Normal rate, regular rhythm and normal heart sounds.  Respiratory: Effort normal and breath sounds normal. No stridor. No respiratory distress.  GI: Soft. He exhibits distension.  There is minimal tenderness  Musculoskeletal: Normal range of motion. He exhibits no edema or tenderness.  Neurological: He is alert and oriented to person, place, and time. Coordination normal.  Skin: Skin is warm and dry. No rash noted.  Psychiatric: He has a normal mood and affect. His behavior is normal. Thought content normal.    Assessment/Plan: The patient appears to have significant constipation with some evidence of pneumatosis by CT scan.  He certainly does not appear ill and with him denying abdominal pain and his white  count normal and temperature normal I would recommend admission to the medical service.  He will need a gentle bowel prep but I would be careful about forcing  fluid into the colon given the current findings.  I would cover him with some antibiotic therapy.  He may require a GI consult also for his constipation.  We will follow him along with you.  TOTH III,Manuel [name] 12/12/2017, 8:28 PM

## 2017-12-12 NOTE — ED Notes (Signed)
Manuel [name] son [phone]

## 2017-12-12 NOTE — H&P (Addendum)
History and Physical    Manuel [name]:[id number] DOB: Jul 28, 1945 DOA: 12/12/2017  PCP: Jani Gravel, MD   Patient coming from: Home  Chief Complaint: Abdominal Swelling, Constipation.   HPI: Manuel [name] is a 72 y.o. male with medical history significant for Atri Fib, OSA, DM, HTN, who presented to the Ed at [facility] with complaints of constipation that started a ~week ago, Thursday, but then he was able to have a small bowel movement ~5 days ago, none since.. At baseline he has 1 bowel movement daily, sometimes 2, without use of miralax. With continued abdominal distension, he saw his PCP yesterday, who got a CT scan of his Abdomen with contrast that showed-- marked gaseous distension of the colon and pneumatosis within the right colon wall. Pt was told to take miralax and enemas, he used OTC enemas 2ce and at least 3 doses of miralax. Patient reports mild intermittent crampy lower abdominal pain. No vomiting, patient has barely eaten any food in the past few days.  Last colonoscopy 3 years ago by Dr. Benson Norway.  ED Course: AT [facility], Bp soft to stable, Cr- 1.23, lactic acid- 0.84, UA few bact.  General surgery was consulted, Dr. Marcello Moores, who recommended transfer to [facility] long the ED for general surgery consult. Here at [facility],  Patient was started on SMOG enema in the Ed which was discontinued on general surg recommendation. here at [facility]. Dr. Marlou Starks was on-call for general surg, who recommended hospitalist admission and he was seen in consult.   Review of Systems: As per HPI otherwise 10 point review of systems negative.   Past Medical History:  Diagnosis Date  . A-fib ([place])   . BPH (benign prostatic hyperplasia)   . Diabetes mellitus   . Dizziness and giddiness   . Dyslipidemia   . Hypertension   . Left carotid bruit   . OSA (obstructive sleep apnea)   . S/P colonoscopy     Past Surgical History:  Procedure Laterality Date  . CARDIOVERSION  12/18/2011   Procedure: CARDIOVERSION;  Surgeon:  Laverda Page, MD;  Location: [facility];  Service: Cardiovascular;  Laterality: N/A;  . CYSTOSTOMY     off back  . HERNIA REPAIR    . 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 He has never used smokeless tobacco. He reports that he does not drink alcohol or use drugs.  Allergies  Allergen Reactions  . Sulfa Antibiotics Other (See Comments)    Headaches     Family History  Problem Relation Age of Onset  . Cancer Father   . Alzheimer's disease Father   . Cancer Mother   . Kidney disease Paternal Aunt     Prior to Admission medications   Medication Sig Start Date End Date Taking? Authorizing Provider  acetaminophen (TYLENOL) 500 MG tablet Take 500 mg by mouth every 6 (six) hours as needed for moderate pain.   Yes [provider]  diltiazem (CARDIZEM CD) 120 MG 24 hr capsule Take 120 mg by mouth daily. 10/28/17  Yes [provider]  doxazosin (CARDURA) 8 MG tablet Take 8 mg by mouth daily.   Yes [provider]  ELIQUIS 5 MG TABS tablet TK 1 T PO BID AFTER FOOD 08/25/15  Yes [provider]  glimepiride (AMARYL) 1 MG tablet Take 1 mg by mouth daily with breakfast.   Yes [provider]  lisinopril (PRINIVIL,ZESTRIL) 20  MG tablet Take 20 mg by mouth at bedtime. 02/04/17  Yes [provider]  metFORMIN (GLUCOPHAGE) 500 MG tablet Take 500 mg by mouth daily. 12/02/17  Yes [provider]  metroNIDAZOLE (FLAGYL) 500 MG tablet Take 500 mg by mouth 2 (two) times daily. 12/11/17  Yes [provider]  pravastatin (PRAVACHOL) 20 MG tablet Take 20 mg by mouth daily.  01/03/16  Yes [provider]    Physical Exam: Vitals:   12/12/17 1054 12/12/17 1233 12/12/17 1441 12/12/17 1724  BP: 100/66 108/84 (!) 115/92 135/88  Pulse: 80 84 88 88  Resp: _0 Temp: 98.1 F (36.7 C)   98.1 F (36.7 C)  TempSrc: Oral   Oral  SpO2: 94%  98% 90%  Weight: 100.7 kg (222 lb)      Height: 6' 3" (1.905 m)       Constitutional: NAD, calm, comfortable Vitals:   12/12/17 1054 12/12/17 1233 12/12/17 1441 12/12/17 1724  BP: 100/66 108/84 (!) 115/92 135/88  Pulse: 80 84 88 88  Resp: _1 Temp: 98.1 F (36.7 C)   98.1 F (36.7 C)  TempSrc: Oral   Oral  SpO2: 94%  98% 90%  Weight: 100.7 kg (222 lb)     Height: 6' 3" (1.905 m)      Eyes: PERRL, lids and conjunctivae normal ENMT: Mucous membranes are dry. Posterior pharynx clear of any exudate or lesions.Normal dentition.  Neck: normal, supple, no masses, no thyromegaly Respiratory: clear to auscultation bilaterally, no wheezing, no crackles. Normal respiratory effort. No accessory muscle use.  Cardiovascular:No murmurs / rubs / gallops. No extremity edema. 2+ pedal pulses. No carotid bruits.  Abdomen: Abdomimal distension, no tenderness, no masses palpated. No hepatosplenomegaly.  Musculoskeletal: no clubbing / cyanosis. No joint deformity upper and lower extremities. Good ROM, no contractures. Normal muscle tone.  Skin: no rashes, lesions, ulcers. No induration Neurologic: CN 2-12 grossly intact.  Strength 5/5 in all 4.  Psychiatric: Normal judgment and insight. Alert and oriented x 3. Normal mood.    Labs on Admission: I have personally reviewed following labs and imaging studies  CBC: Recent Labs  Lab 12/12/17 1335  WBC 5.2  NEUTROABS 3.9  HGB 15.3  HCT 45.4  MCV 88.0  PLT 927   Basic Metabolic Panel: Recent Labs  Lab 12/12/17 1335  NA 136  K 3.7  CL 104  CO2 22  GLUCOSE 85  BUN 27*  CREATININE 1.23  CALCIUM 8.3*   Liver Function Tests: Recent Labs  Lab 12/12/17 1335  AST 17  ALT 15*  ALKPHOS 70  BILITOT 1.4*  PROT 6.4*  ALBUMIN 3.3*   Recent Labs  Lab 12/12/17 1335  LIPASE 21   Coagulation Profile: Recent Labs  Lab 12/12/17 1335  INR 1.35   Urine analysis:    Component Value Date/Time   COLORURINE YELLOW 12/12/2017 [place] 12/12/2017 1335     LABSPEC >1.030 (H) 12/12/2017 1335   PHURINE 5.5 12/12/2017 1335   GLUCOSEU NEGATIVE 12/12/2017 1335   HGBUR NEGATIVE 12/12/2017 1335   BILIRUBINUR SMALL (A) 12/12/2017 1335   KETONESUR NEGATIVE 12/12/2017 1335   PROTEINUR 100 (A) 12/12/2017 1335   NITRITE NEGATIVE 12/12/2017 1335   LEUKOCYTESUR NEGATIVE 12/12/2017 1335    Radiological Exams on Admission: Ct Abdomen Pelvis W Contrast  Result Date: 12/11/2017 CLINICAL DATA:  Left lower quadrant and mid abdominal pain. Constipation. EXAM: CT ABDOMEN AND PELVIS WITH CONTRAST TECHNIQUE:  Multidetector CT imaging of the abdomen and pelvis was performed using the standard protocol following bolus administration of intravenous contrast. CONTRAST:  148m ISOVUE-300 IOPAMIDOL (ISOVUE-300) INJECTION 61% COMPARISON:  None. FINDINGS: Lower chest: Subsegmental atelectasis or scarring in the lung bases. Cardiomegaly. No effusions. Hepatobiliary: No focal hepatic abnormality. Gallbladder unremarkable. Pancreas: No focal abnormality or ductal dilatation. Spleen: No focal abnormality.  Normal size. Adrenals/Urinary Tract: Small nodule in the left adrenal gland measures 15 mm, nonspecific. Absence of the left kidney. Right kidney is unremarkable. No hydronephrosis. Urinary bladder unremarkable. Stomach/Bowel: There is marked gaseous distention of the colon with large amount of stool and gas in the colon to the level of the mid sigmoid colon. There is pneumatosis within the wall of the cecum and ascending colon several small bowel loops are dilated and fluid-filled as well. Stomach is decompressed, unremarkable. Sigmoid diverticulosis. No active diverticulitis. Vascular/Lymphatic: No evidence of aneurysm or adenopathy. Mesenteric vessels are widely patent. Reproductive: No visible focal abnormality. Other: Small amount of free fluid in the pelvis and right paracolic gutter. Musculoskeletal: Diffuse degenerative changes in the lumbar spine. IMPRESSION: Marked gaseous  distention of the colon to the level of the mid sigmoid colon where there is sigmoid diverticulosis and mild wall thickening which could be related to diverticulosis and muscular hypertrophy or could reflect an annular colonic lesion. In addition, there is pneumatosis within the right colon wall. Large stool burden in the right colon, transverse colon and descending colon. Small bowel loops are also mild to moderately dilated. Absence of the left kidney. Small left adrenal nodule, 15 mm, nonspecific. Small amount of free fluid in the right lower quadrant and cul-de-sac. These results were called by telephone at the time of interpretation on 12/11/2017 at 1:55 pm to Dr. JJani Gravel, who verbally acknowledged these results. Electronically Signed   By: KRolm BaptiseM.D.   On: 12/11/2017 13:53    EKG: Independently reviewed. None.   Assessment/Plan Active Problems:   Atrial fibrillation ([facility])   BPH (benign prostatic hyperplasia)   DM (diabetes mellitus) ([facility])   HTN (hypertension)   OSA (obstructive sleep apnea)   Severe constipation- X 1 week, last bowel movement small- 4 days ago.  Last colonoscopy 3 years ago, by Dr. HBenson Norway  CT abdomen and pelvis W contrast 12/11/17-marked gaseous distention of colon to the level of the mid sigmoid colon, possible annular colonic lesion, pneumatosis within the right colon wall,  Large stool burden-detailed report. -General surgery consulted in ED - Abtics, Gi consult, gentle bowel prep -Considering marked colon distention, am concerned about bowel perforation continued laxatives, hence I will consult GI for recommendations -  Spoke with Gi, Dr. Hung-recommended NG tube and flexiseal rectal tube. Can hold off on laxatives and enemas. Will plan for flex sigmoidoscopy tomorrow  -N.p.o. except for sips with meds - Ns + 20 KCl 100cc/hr x 12 hrs  Pneumatosis of the colon-  Mild abdominal pain, without tenderness, abd soft but distended, WBC- 5.2, No fever. LActic acid-  0.8 - Iv metronidazole 500 TID  - F/u gen surg recs- Abtics, Gi consult, gentle bowel prep  Atrial fibrillation-rate controlled, on anticoagulation -New home Cardizem, Eliquis  BPH-continue home doxazosin  DM- blood glucose- 85.  - Hold home metformin, amarylm while NPO, contrast exposure yesterday - SSI   DVT prophylaxis: Hold Apixan for now, SCDspending Flexible sigmoidoscopy Code Status: Full Family Communication: None at bedside Disposition Plan: Per rounding team Consults called: General Surg, GI Admission status: Obs, tele  Bethena Roys MD [facility] Pager [phone] From 6PM-2AM.  Otherwise please contact night-coverage [email or web address] Password [facility]  12/12/2017, 8:36 PM

## 2017-12-12 NOTE — ED Notes (Signed)
Pt here for transport

## 2017-12-12 NOTE — ED Notes (Signed)
ED Provider at bedside. 

## 2017-12-12 NOTE — ED Provider Notes (Signed)
[facility] EMERGENCY DEPARTMENT Provider Note   CSN: [id number] Arrival date & time: 12/12/17  1020     History   Chief Complaint Chief Complaint  Patient presents with  . Constipation    HPI Manuel [name] is a 72 y.o. male.  HPI Reports he has had difficulty for 6 days with abdominal distention and unable to pass stool.  He does report he is passing small amounts of stool.  Last episode 5 days ago.  He reports his abdomen has become extremely distended and uncomfortable.  He went to see his primary care doctor and had a CT scan done yesterday.  Based on those results he was instructed to try an enema.  He reports he is tried enemas and he is only gotten the same water back out that went in.  He has been trying MiraLAX for several days with no bowel movement.  Reports his abdomen just keeps getting more and more bloated and tight.  He has not had vomiting.  He denies prior history of similar. Past Medical History:  Diagnosis Date  . A-fib ([place])   . BPH (benign prostatic hyperplasia)   . Diabetes mellitus   . Dizziness and giddiness   . Dyslipidemia   . Hypertension   . Left carotid bruit   . OSA (obstructive sleep apnea)   . S/P colonoscopy     Patient Active Problem List   Diagnosis Date Noted  . Left groin pain 04/10/2011    Past Surgical History:  Procedure Laterality Date  . CARDIOVERSION  12/18/2011   Procedure: CARDIOVERSION;  Surgeon: Laverda Page, MD;  Location: [place] [facility];  Service: Cardiovascular;  Laterality: N/A;  . CYSTOSTOMY     off back  . HERNIA REPAIR    . 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 Authorizing Provider  ciprofloxacin (CIPRO) 500 MG tablet Take 500 mg by mouth 2 (two) times daily.   Yes [provider]  diltiazem (CARDIZEM) 120 MG tablet Take 120 mg by mouth daily.    Yes [provider]  doxazosin (CARDURA) 8 MG tablet Take 8 mg by mouth daily.   Yes [provider]  ELIQUIS 5 MG TABS tablet TK 1 T PO BID AFTER FOOD 08/25/15  Yes [provider]  glimepiride (AMARYL) 1 MG tablet Take 1 mg by mouth daily with breakfast.   Yes [provider]  lisinopril (PRINIVIL,ZESTRIL) 20 MG tablet Take 20 mg by mouth at bedtime. 02/04/17  Yes [provider]  metFORMIN (GLUMETZA) 500 MG (MOD) 24 hr tablet Take 500 mg by mouth 2 (two) times daily with a meal.    Yes [provider]  pravastatin (PRAVACHOL) 20 MG tablet  01/03/16  Yes [provider]    Family History Family History  Problem Relation Age of Onset  . Cancer Father   . Alzheimer's disease Father   . Cancer Mother   . Kidney disease Paternal Aunt     Social History Social History   Tobacco Use  . Smoking status: Former Research scientist (life sciences)  . Smokeless tobacco: Never Used  . Tobacco comment: 12/2014  Substance Use Topics  . Alcohol use: No    Alcohol/week: 0.0 oz  . Drug use: No     Allergies   Sulfa antibiotics   Review of  Systems Review of Systems  10 Systems reviewed and are negative for acute change except as noted in the HPI.  Physical Exam Updated Vital Signs BP (!) 115/92 (BP Location: Left Arm)   Pulse 88   Temp 98.1 F (36.7 C) (Oral)   Resp 20   Ht _0  (1.905 m)   Wt 100.7 kg (222 lb)   SpO2 98%   BMI 27.75 kg/m   Physical Exam  Constitutional: He is oriented to person, place, and time.  Patient is alert and appropriate.  He is nontoxic.  No respiratory distress.  Abdomen is clearly distended.  HENT:  Head: Normocephalic and atraumatic.  Mouth/Throat: Oropharynx is clear and moist.  Eyes: Conjunctivae and EOM are normal.  Neck: Neck supple.  Cardiovascular: Normal rate, regular rhythm, normal heart sounds and intact distal pulses.  Pulmonary/Chest: Effort normal and breath sounds normal.  Abdominal:  Abdomen is distended and  taut.  No erythema or edema of the abdominal wall.  No fluid wave to suggest ascites.  Patient denies severe pain with palpation but endorses diffuse uncomfortable pressure.  Musculoskeletal: Normal range of motion. He exhibits no edema or tenderness.  Rectal exam: Very small amount of soft brown semi-formed stool in the vault.  Vault is otherwise empty.  No impaction.  Neurological: He is alert and oriented to person, place, and time. He exhibits normal muscle tone. Coordination normal.  Skin: Skin is warm and dry.  Psychiatric: He has a normal mood and affect.     ED Treatments / Results  Labs (all labs ordered are listed, but only abnormal results are displayed) Labs Reviewed  COMPREHENSIVE METABOLIC PANEL - Abnormal; Notable for the following components:      Result Value   BUN 27 (*)    Calcium 8.3 (*)    Total Protein 6.4 (*)    Albumin 3.3 (*)    ALT 15 (*)    Total Bilirubin 1.4 (*)    GFR calc non Af Amer 57 (*)    All other components within normal limits  PROTIME-INR - Abnormal; Notable for the following components:   Prothrombin Time 16.6 (*)    All other components within normal limits  URINALYSIS, ROUTINE W REFLEX MICROSCOPIC - Abnormal; Notable for the following components:   Specific Gravity, Urine >1.030 (*)    Bilirubin Urine SMALL (*)    Protein, ur 100 (*)    All other components within normal limits  URINALYSIS, MICROSCOPIC (REFLEX) - Abnormal; Notable for the following components:   Bacteria, UA FEW (*)    All other components within normal limits  LIPASE, BLOOD  CBC WITH DIFFERENTIAL/PLATELET  OCCULT BLOOD X 1 CARD TO LAB, STOOL  I-STAT CG4 LACTIC ACID, ED  I-STAT CG4 LACTIC ACID, ED  I-STAT CG4 LACTIC ACID, ED    EKG None  Radiology Ct Abdomen Pelvis W Contrast  Result Date: 12/11/2017 CLINICAL DATA:  Left lower quadrant and mid abdominal pain. Constipation. EXAM: CT ABDOMEN AND PELVIS WITH CONTRAST TECHNIQUE: Multidetector CT imaging of the  abdomen and pelvis was performed using the standard protocol following bolus administration of intravenous contrast. CONTRAST:  138m ISOVUE-300 IOPAMIDOL (ISOVUE-300) INJECTION 61% COMPARISON:  None. FINDINGS: Lower chest: Subsegmental atelectasis or scarring in the lung bases. Cardiomegaly. No effusions. Hepatobiliary: No focal hepatic abnormality. Gallbladder unremarkable. Pancreas: No focal abnormality or ductal dilatation. Spleen: No focal abnormality.  Normal size. Adrenals/Urinary Tract: Small nodule in the left adrenal gland measures 15 mm, nonspecific. Absence of the left  kidney. Right kidney is unremarkable. No hydronephrosis. Urinary bladder unremarkable. Stomach/Bowel: There is marked gaseous distention of the colon with large amount of stool and gas in the colon to the level of the mid sigmoid colon. There is pneumatosis within the wall of the cecum and ascending colon several small bowel loops are dilated and fluid-filled as well. Stomach is decompressed, unremarkable. Sigmoid diverticulosis. No active diverticulitis. Vascular/Lymphatic: No evidence of aneurysm or adenopathy. Mesenteric vessels are widely patent. Reproductive: No visible focal abnormality. Other: Small amount of free fluid in the pelvis and right paracolic gutter. Musculoskeletal: Diffuse degenerative changes in the lumbar spine. IMPRESSION: Marked gaseous distention of the colon to the level of the mid sigmoid colon where there is sigmoid diverticulosis and mild wall thickening which could be related to diverticulosis and muscular hypertrophy or could reflect an annular colonic lesion. In addition, there is pneumatosis within the right colon wall. Large stool burden in the right colon, transverse colon and descending colon. Small bowel loops are also mild to moderately dilated. Absence of the left kidney. Small left adrenal nodule, 15 mm, nonspecific. Small amount of free fluid in the right lower quadrant and cul-de-sac. These results  were called by telephone at the time of interpretation on 12/11/2017 at 1:55 pm to Dr. Jani Gravel , who verbally acknowledged these results. Electronically Signed   By: Rolm Baptise M.D.   On: 12/11/2017 13:53    Procedures Procedures (including critical care time) No critical care time Medications Ordered in ED Medications  0.9 %  sodium chloride infusion (has no administration in time range)     Initial Impression / Assessment and Plan / ED Course  I have reviewed the triage vital signs and the nursing notes.  Pertinent labs & imaging results that were available during my care of the patient were reviewed by me and considered in my medical decision making (see chart for details).     Consult: Reviewed with Rennis Chris general surgery.  Requests patient be transferred to [facility] emergency department for consultation with general surgery. Consult: Dr. Nanda Quinton in the emergency department except for transfer to [facility], ED. Final Clinical Impressions(s) / ED Diagnoses   Final diagnoses:  Pneumatosis intestinalis of large intestine  Constipation, unspecified constipation type  Abdominal distension   Patient has developed severe abdominal distention.  He has severe colonic dilation and pneumatosis of the bowel wall.  Patient is alert and appropriate.  He is not septic or toxic in appearance.  Labs are within normal limits.  Unclear at this time if this is an acute condition.  By history patient does not endorse long-standing constipation.  He states that his bowels have always worked fairly well.  Based on that history it sounds unlikely that he has chronic colon dilation.  Does report that he had colonoscopy about 3 years ago and he had some polyps.  He reports he was supposed to be scheduled to keep up surveillance on those.  Some question as to whether or not patient has a neoplastic or mass stricture causing symptoms.  Case has been reviewed with general surgery.  Patient  will be transferred for consultation and definitive management by general surgery.  Patient has stable vital signs, normal labs, normal mental status, no respiratory distress and no active peritoneal signs on physical exam.  No empiric antibiotics initiated. ED Discharge Orders    None       Charlesetta Shanks, MD 12/12/17 [phone]

## 2017-12-12 NOTE — ED Provider Notes (Signed)
Pt is a transfer from [facility] due to pneumatosis of the colon seen on CT scan from yesterday.  The pt denies any pain, but feels he is "full."  Dr. Marlou Starks is now on call.  He requests hospitalist admission and he will see in consult.  Pt d/w Dr. Denton Brick ([facility]) who will admit.   Isla Pence, MD 12/12/17 [phone]

## 2017-12-12 NOTE — ED Notes (Signed)
Patient BIB EMS for 6 days of constipation. Patient attempted home enemas, without relief. Patient had CT scan at [facility] and was diagnosed with Pneumatosis of the colon. Was sent to [facility] for surgical consult. Patient is in Atrial Fibrillation, with history of Afib. Patient is on Eliquis. Patient denies abdominal pain, nausea/vomting. Patient reports feeling "full."

## 2017-12-13 ENCOUNTER — Observation Stay (HOSPITAL_COMMUNITY): Payer: Medicare HMO | Admitting: Anesthesiology

## 2017-12-13 ENCOUNTER — Encounter (HOSPITAL_COMMUNITY)
Admission: EM | Disposition: A | Discharge status: Home Health | Payer: Self-pay | Source: Home / Self Care | Attending: Internal Medicine

## 2017-12-13 ENCOUNTER — Encounter (HOSPITAL_COMMUNITY): Payer: Self-pay | Admitting: Certified Registered"

## 2017-12-13 DIAGNOSIS — K6389 Other specified diseases of intestine: Secondary | ICD-10-CM | POA: Diagnosis not present

## 2017-12-13 DIAGNOSIS — K229 Disease of esophagus, unspecified: Secondary | ICD-10-CM | POA: Diagnosis not present

## 2017-12-13 DIAGNOSIS — Z87891 Personal history of nicotine dependence: Secondary | ICD-10-CM | POA: Diagnosis not present

## 2017-12-13 DIAGNOSIS — N400 Enlarged prostate without lower urinary tract symptoms: Secondary | ICD-10-CM | POA: Diagnosis present

## 2017-12-13 DIAGNOSIS — K5900 Constipation, unspecified: Secondary | ICD-10-CM | POA: Diagnosis not present

## 2017-12-13 DIAGNOSIS — E118 Type 2 diabetes mellitus with unspecified complications: Secondary | ICD-10-CM | POA: Diagnosis not present

## 2017-12-13 DIAGNOSIS — K269 Duodenal ulcer, unspecified as acute or chronic, without hemorrhage or perforation: Secondary | ICD-10-CM | POA: Diagnosis not present

## 2017-12-13 DIAGNOSIS — I9788 Other intraoperative complications of the circulatory system, not elsewhere classified: Secondary | ICD-10-CM | POA: Diagnosis not present

## 2017-12-13 DIAGNOSIS — B3781 Candidal esophagitis: Secondary | ICD-10-CM | POA: Diagnosis not present

## 2017-12-13 DIAGNOSIS — K2980 Duodenitis without bleeding: Secondary | ICD-10-CM | POA: Diagnosis not present

## 2017-12-13 DIAGNOSIS — K5732 Diverticulitis of large intestine without perforation or abscess without bleeding: Secondary | ICD-10-CM | POA: Diagnosis not present

## 2017-12-13 DIAGNOSIS — K56609 Unspecified intestinal obstruction, unspecified as to partial versus complete obstruction: Secondary | ICD-10-CM | POA: Diagnosis not present

## 2017-12-13 DIAGNOSIS — K56699 Other intestinal obstruction unspecified as to partial versus complete obstruction: Secondary | ICD-10-CM | POA: Diagnosis not present

## 2017-12-13 DIAGNOSIS — E876 Hypokalemia: Secondary | ICD-10-CM | POA: Diagnosis not present

## 2017-12-13 DIAGNOSIS — E538 Deficiency of other specified B group vitamins: Secondary | ICD-10-CM | POA: Diagnosis present

## 2017-12-13 DIAGNOSIS — R933 Abnormal findings on diagnostic imaging of other parts of digestive tract: Secondary | ICD-10-CM | POA: Diagnosis not present

## 2017-12-13 DIAGNOSIS — R0902 Hypoxemia: Secondary | ICD-10-CM | POA: Diagnosis not present

## 2017-12-13 DIAGNOSIS — R7981 Abnormal blood-gas level: Secondary | ICD-10-CM | POA: Diagnosis not present

## 2017-12-13 DIAGNOSIS — R000 Tachycardia, unspecified: Secondary | ICD-10-CM | POA: Diagnosis not present

## 2017-12-13 DIAGNOSIS — K5730 Diverticulosis of large intestine without perforation or abscess without bleeding: Secondary | ICD-10-CM | POA: Diagnosis not present

## 2017-12-13 DIAGNOSIS — K5939 Other megacolon: Secondary | ICD-10-CM | POA: Diagnosis not present

## 2017-12-13 DIAGNOSIS — D62 Acute posthemorrhagic anemia: Secondary | ICD-10-CM | POA: Diagnosis not present

## 2017-12-13 DIAGNOSIS — Z9049 Acquired absence of other specified parts of digestive tract: Secondary | ICD-10-CM

## 2017-12-13 DIAGNOSIS — E119 Type 2 diabetes mellitus without complications: Secondary | ICD-10-CM | POA: Diagnosis not present

## 2017-12-13 DIAGNOSIS — Z7901 Long term (current) use of anticoagulants: Secondary | ICD-10-CM | POA: Diagnosis not present

## 2017-12-13 DIAGNOSIS — E785 Hyperlipidemia, unspecified: Secondary | ICD-10-CM | POA: Diagnosis present

## 2017-12-13 DIAGNOSIS — R69 Illness, unspecified: Secondary | ICD-10-CM | POA: Diagnosis not present

## 2017-12-13 DIAGNOSIS — K921 Melena: Secondary | ICD-10-CM | POA: Diagnosis not present

## 2017-12-13 DIAGNOSIS — I482 Chronic atrial fibrillation: Secondary | ICD-10-CM | POA: Diagnosis not present

## 2017-12-13 DIAGNOSIS — I4891 Unspecified atrial fibrillation: Secondary | ICD-10-CM | POA: Diagnosis not present

## 2017-12-13 DIAGNOSIS — K259 Gastric ulcer, unspecified as acute or chronic, without hemorrhage or perforation: Secondary | ICD-10-CM | POA: Diagnosis not present

## 2017-12-13 DIAGNOSIS — Z882 Allergy status to sulfonamides status: Secondary | ICD-10-CM | POA: Diagnosis not present

## 2017-12-13 DIAGNOSIS — I10 Essential (primary) hypertension: Secondary | ICD-10-CM | POA: Diagnosis not present

## 2017-12-13 DIAGNOSIS — K2950 Unspecified chronic gastritis without bleeding: Secondary | ICD-10-CM | POA: Diagnosis not present

## 2017-12-13 DIAGNOSIS — J984 Other disorders of lung: Secondary | ICD-10-CM | POA: Diagnosis not present

## 2017-12-13 DIAGNOSIS — J9601 Acute respiratory failure with hypoxia: Secondary | ICD-10-CM | POA: Diagnosis not present

## 2017-12-13 DIAGNOSIS — I9589 Other hypotension: Secondary | ICD-10-CM | POA: Diagnosis not present

## 2017-12-13 DIAGNOSIS — G4733 Obstructive sleep apnea (adult) (pediatric): Secondary | ICD-10-CM | POA: Diagnosis not present

## 2017-12-13 DIAGNOSIS — Z7984 Long term (current) use of oral hypoglycemic drugs: Secondary | ICD-10-CM | POA: Diagnosis not present

## 2017-12-13 DIAGNOSIS — J9811 Atelectasis: Secondary | ICD-10-CM | POA: Diagnosis not present

## 2017-12-13 DIAGNOSIS — K922 Gastrointestinal hemorrhage, unspecified: Secondary | ICD-10-CM | POA: Diagnosis not present

## 2017-12-13 HISTORY — DX: Other intestinal obstruction unspecified as to partial versus complete obstruction: K56.699

## 2017-12-13 HISTORY — DX: Acquired absence of other specified parts of digestive tract: Z90.49

## 2017-12-13 HISTORY — PX: FLEXIBLE SIGMOIDOSCOPY: SHX5431

## 2017-12-13 HISTORY — PX: COLON RESECTION: SHX5231

## 2017-12-13 LAB — GLUCOSE, CAPILLARY
GLUCOSE-CAPILLARY: 62 mg/dL — AB (ref 65–99)
GLUCOSE-CAPILLARY: 67 mg/dL (ref 65–99)
Glucose-Capillary: 97 mg/dL (ref 65–99)
Glucose-Capillary: 95 mg/dL (ref 65–99)
Glucose-Capillary: 114 mg/dL — ABNORMAL HIGH (ref 65–99)
Glucose-Capillary: 133 mg/dL — ABNORMAL HIGH (ref 65–99)

## 2017-12-13 LAB — MRSA PCR SCREENING: MRSA BY PCR: NEGATIVE

## 2017-12-13 LAB — CBC
HCT: 49 % (ref 39.0–52.0)
Hemoglobin: 15.5 g/dL (ref 13.0–17.0)
MCH: 28.9 pg (ref 26.0–34.0)
MCHC: 31.6 g/dL (ref 30.0–36.0)
MCV: 91.2 fL (ref 78.0–100.0)
PLATELETS: 235 10*3/uL (ref 150–400)
RBC: 5.37 MIL/uL (ref 4.22–5.81)
RDW: 15.6 % — AB (ref 11.5–15.5)
WBC: 5.3 10*3/uL (ref 4.0–10.5)

## 2017-12-13 LAB — BASIC METABOLIC PANEL
Anion gap: 10 (ref 5–15)
BUN: 27 mg/dL — AB (ref 6–20)
CALCIUM: 8.4 mg/dL — AB (ref 8.9–10.3)
CHLORIDE: 104 mmol/L (ref 101–111)
CO2: 26 mmol/L (ref 22–32)
CREATININE: 1.17 mg/dL (ref 0.61–1.24)
GFR calc non Af Amer: >60 mL/min (ref >60)
GLUCOSE: 79 mg/dL (ref 65–99)
Potassium: 4 mmol/L (ref 3.5–5.1)
Sodium: 140 mmol/L (ref 135–145)

## 2017-12-13 SURGERY — COLON RESECTION LAPAROSCOPIC
Anesthesia: General | Site: Abdomen

## 2017-12-13 SURGERY — SIGMOIDOSCOPY, FLEXIBLE

## 2017-12-13 MED ORDER — HYDROMORPHONE HCL 1 MG/ML IJ SOLN
INTRAMUSCULAR | Status: AC
  Filled 2017-12-13: qty 1

## 2017-12-13 MED ORDER — METRONIDAZOLE IN NACL 5-0.79 MG/ML-% IV SOLN
500.0000 mg | Freq: Three times a day (TID) | INTRAVENOUS | Status: DC
  Administered 2017-12-13 – 2017-12-16 (×8): 500 mg via INTRAVENOUS
  Filled 2017-12-13 (×8): qty 100

## 2017-12-13 MED ORDER — PROPOFOL 10 MG/ML IV BOLUS
INTRAVENOUS | Status: AC
  Filled 2017-12-13: qty 20

## 2017-12-13 MED ORDER — BUPIVACAINE LIPOSOME 1.3 % IJ SUSP
INTRAMUSCULAR | Status: DC | PRN
  Administered 2017-12-13: 20 mL

## 2017-12-13 MED ORDER — SUCCINYLCHOLINE CHLORIDE 200 MG/10ML IV SOSY
PREFILLED_SYRINGE | INTRAVENOUS | Status: AC
  Filled 2017-12-13: qty 10

## 2017-12-13 MED ORDER — BUPIVACAINE-EPINEPHRINE 0.5% -1:200000 IJ SOLN
INTRAMUSCULAR | Status: AC
  Filled 2017-12-13: qty 1

## 2017-12-13 MED ORDER — LACTATED RINGERS IR SOLN
Status: DC | PRN
  Administered 2017-12-13: 1000 mL

## 2017-12-13 MED ORDER — SODIUM CHLORIDE 0.9 % IV SOLN: 2.0000 g | INTRAVENOUS | Status: DC

## 2017-12-13 MED ORDER — PHENYLEPHRINE 40 MCG/ML (10ML) SYRINGE FOR IV PUSH (FOR BLOOD PRESSURE SUPPORT)
PREFILLED_SYRINGE | INTRAVENOUS | Status: DC | PRN
  Administered 2017-12-13: 80 ug via INTRAVENOUS
  Administered 2017-12-13: 40 ug via INTRAVENOUS
  Administered 2017-12-13 (×2): 80 ug via INTRAVENOUS

## 2017-12-13 MED ORDER — SUGAMMADEX SODIUM 200 MG/2ML IV SOLN
INTRAVENOUS | Status: DC | PRN
  Administered 2017-12-13: 200 mg via INTRAVENOUS

## 2017-12-13 MED ORDER — DEXAMETHASONE SODIUM PHOSPHATE 10 MG/ML IJ SOLN
INTRAMUSCULAR | Status: DC | PRN
  Administered 2017-12-13: 10 mg via INTRAVENOUS

## 2017-12-13 MED ORDER — SODIUM CHLORIDE 0.9 % IV SOLN: INTRAVENOUS | Status: DC

## 2017-12-13 MED ORDER — SUGAMMADEX SODIUM 200 MG/2ML IV SOLN
INTRAVENOUS | Status: AC
  Filled 2017-12-13: qty 2

## 2017-12-13 MED ORDER — PHENYLEPHRINE 40 MCG/ML (10ML) SYRINGE FOR IV PUSH (FOR BLOOD PRESSURE SUPPORT)
PREFILLED_SYRINGE | INTRAVENOUS | Status: AC
  Filled 2017-12-13: qty 10

## 2017-12-13 MED ORDER — SODIUM CHLORIDE 0.9 % IV SOLN
2.0000 g | INTRAVENOUS | Status: DC
  Administered 2017-12-13 – 2017-12-15 (×3): 2 g via INTRAVENOUS
  Filled 2017-12-13 (×3): qty 2

## 2017-12-13 MED ORDER — FAMOTIDINE IN NACL 20-0.9 MG/50ML-% IV SOLN
20.0000 mg | INTRAVENOUS | Status: DC
  Administered 2017-12-13 – 2017-12-15 (×3): 20 mg via INTRAVENOUS
  Filled 2017-12-13 (×3): qty 50

## 2017-12-13 MED ORDER — MORPHINE SULFATE (PF) 4 MG/ML IV SOLN: 1.0000 mg | INTRAVENOUS | Status: DC | PRN

## 2017-12-13 MED ORDER — LACTATED RINGERS IV SOLN: INTRAVENOUS | Status: DC

## 2017-12-13 MED ORDER — BUPIVACAINE LIPOSOME 1.3 % IJ SUSP: 20.0000 mL | Freq: Once | INTRAMUSCULAR | Status: DC

## 2017-12-13 MED ORDER — ENOXAPARIN SODIUM 40 MG/0.4ML ~~LOC~~ SOLN
40.0000 mg | SUBCUTANEOUS | Status: DC
  Administered 2017-12-14 – 2017-12-16 (×3): 40 mg via SUBCUTANEOUS
  Filled 2017-12-13 (×3): qty 0.4

## 2017-12-13 MED ORDER — ALBUMIN HUMAN 5 % IV SOLN
INTRAVENOUS | Status: AC
  Filled 2017-12-13: qty 250

## 2017-12-13 MED ORDER — ONDANSETRON HCL 4 MG/2ML IJ SOLN
INTRAMUSCULAR | Status: DC | PRN
  Administered 2017-12-13: 4 mg via INTRAVENOUS

## 2017-12-13 MED ORDER — METOPROLOL TARTRATE 5 MG/5ML IV SOLN
INTRAVENOUS | Status: DC | PRN
  Administered 2017-12-13: 2.5 mg via INTRAVENOUS

## 2017-12-13 MED ORDER — METHOCARBAMOL 1000 MG/10ML IJ SOLN
500.0000 mg | Freq: Three times a day (TID) | INTRAVENOUS | Status: DC | PRN
  Filled 2017-12-13: qty 5

## 2017-12-13 MED ORDER — DEXAMETHASONE SODIUM PHOSPHATE 10 MG/ML IJ SOLN
INTRAMUSCULAR | Status: AC
  Filled 2017-12-13: qty 1

## 2017-12-13 MED ORDER — ESMOLOL HCL 100 MG/10ML IV SOLN
INTRAVENOUS | Status: AC
  Filled 2017-12-13: qty 10

## 2017-12-13 MED ORDER — LIDOCAINE 2% (20 MG/ML) 5 ML SYRINGE
INTRAMUSCULAR | Status: DC | PRN
  Administered 2017-12-13: 100 mg via INTRAVENOUS

## 2017-12-13 MED ORDER — METOPROLOL TARTRATE 5 MG/5ML IV SOLN
INTRAVENOUS | Status: AC
  Filled 2017-12-13: qty 5

## 2017-12-13 MED ORDER — ONDANSETRON HCL 4 MG/2ML IJ SOLN
INTRAMUSCULAR | Status: AC
  Filled 2017-12-13: qty 2

## 2017-12-13 MED ORDER — CEFTRIAXONE SODIUM 1 G IJ SOLR
1.0000 g | INTRAMUSCULAR | Status: DC
  Filled 2017-12-13: qty 10

## 2017-12-13 MED ORDER — MEPERIDINE HCL 50 MG/ML IJ SOLN: 6.2500 mg | INTRAMUSCULAR | Status: DC | PRN

## 2017-12-13 MED ORDER — ROCURONIUM BROMIDE 10 MG/ML (PF) SYRINGE
PREFILLED_SYRINGE | INTRAVENOUS | Status: DC | PRN
  Administered 2017-12-13: 30 mg via INTRAVENOUS
  Administered 2017-12-13: 20 mg via INTRAVENOUS

## 2017-12-13 MED ORDER — FENTANYL CITRATE (PF) 100 MCG/2ML IJ SOLN
INTRAMUSCULAR | Status: DC | PRN
  Administered 2017-12-13: 50 ug via INTRAVENOUS

## 2017-12-13 MED ORDER — PROMETHAZINE HCL 25 MG/ML IJ SOLN: 6.2500 mg | INTRAMUSCULAR | Status: DC | PRN

## 2017-12-13 MED ORDER — SUCCINYLCHOLINE CHLORIDE 200 MG/10ML IV SOSY
PREFILLED_SYRINGE | INTRAVENOUS | Status: DC | PRN
  Administered 2017-12-13: 120 mg via INTRAVENOUS

## 2017-12-13 MED ORDER — DEXTROSE 50 % IV SOLN
INTRAVENOUS | Status: AC
  Administered 2017-12-13: 25 mL
  Filled 2017-12-13: qty 50

## 2017-12-13 MED ORDER — ROCURONIUM BROMIDE 10 MG/ML (PF) SYRINGE
PREFILLED_SYRINGE | INTRAVENOUS | Status: AC
  Filled 2017-12-13: qty 5

## 2017-12-13 MED ORDER — BUPIVACAINE-EPINEPHRINE (PF) 0.5% -1:200000 IJ SOLN
INTRAMUSCULAR | Status: DC | PRN
  Administered 2017-12-13: 50 mL via PERINEURAL

## 2017-12-13 MED ORDER — ESMOLOL HCL 100 MG/10ML IV SOLN
INTRAVENOUS | Status: DC | PRN
  Administered 2017-12-13: 10 mg via INTRAVENOUS
  Administered 2017-12-13 (×2): 20 mg via INTRAVENOUS

## 2017-12-13 MED ORDER — BUPIVACAINE LIPOSOME 1.3 % IJ SUSP
20.0000 mL | Freq: Once | INTRAMUSCULAR | Status: DC
  Filled 2017-12-13: qty 20

## 2017-12-13 MED ORDER — FENTANYL CITRATE (PF) 100 MCG/2ML IJ SOLN
INTRAMUSCULAR | Status: AC
  Filled 2017-12-13: qty 2

## 2017-12-13 MED ORDER — ALBUMIN HUMAN 5 % IV SOLN
INTRAVENOUS | Status: DC | PRN
  Administered 2017-12-13: 15:00:00 via INTRAVENOUS

## 2017-12-13 MED ORDER — MIDAZOLAM HCL 5 MG/ML IJ SOLN
INTRAMUSCULAR | Status: AC
  Filled 2017-12-13: qty 2

## 2017-12-13 MED ORDER — HYDROMORPHONE HCL 1 MG/ML IJ SOLN
0.2500 mg | INTRAMUSCULAR | Status: DC | PRN
  Administered 2017-12-13 (×2): 0.5 mg via INTRAVENOUS

## 2017-12-13 MED ORDER — SODIUM CHLORIDE 0.9 % IR SOLN
Status: DC | PRN
  Administered 2017-12-13: 2000 mL

## 2017-12-13 MED ORDER — PROPOFOL 10 MG/ML IV BOLUS
INTRAVENOUS | Status: DC | PRN
  Administered 2017-12-13: 160 mg via INTRAVENOUS

## 2017-12-13 MED ORDER — DILTIAZEM HCL-DEXTROSE 100-5 MG/100ML-% IV SOLN (PREMIX)
5.0000 mg/h | INTRAVENOUS | Status: DC
  Administered 2017-12-13: 5 mg/h via INTRAVENOUS
  Filled 2017-12-13: qty 100

## 2017-12-13 MED ORDER — ACETAMINOPHEN 10 MG/ML IV SOLN
1000.0000 mg | Freq: Four times a day (QID) | INTRAVENOUS | Status: AC
  Administered 2017-12-13 – 2017-12-14 (×4): 1000 mg via INTRAVENOUS
  Filled 2017-12-13 (×4): qty 100

## 2017-12-13 MED ORDER — CEFTRIAXONE SODIUM 1 G IJ SOLR
2.0000 g | INTRAMUSCULAR | Status: DC
  Filled 2017-12-13: qty 20

## 2017-12-13 MED ORDER — LACTATED RINGERS IV SOLN
INTRAVENOUS | Status: DC
  Administered 2017-12-13 – 2017-12-17 (×7): via INTRAVENOUS

## 2017-12-13 MED ORDER — LIDOCAINE 2% (20 MG/ML) 5 ML SYRINGE
INTRAMUSCULAR | Status: AC
  Filled 2017-12-13: qty 5

## 2017-12-13 SURGICAL SUPPLY — 73 items
ADH SKN CLS APL DERMABOND .7 (GAUZE/BANDAGES/DRESSINGS) ×1
APPLIER CLIP 5 13 M/L LIGAMAX5 (MISCELLANEOUS)
APR CLP MED LRG 5 ANG JAW (MISCELLANEOUS)
BLADE EXTENDED COATED 6.5IN (ELECTRODE) ×1 IMPLANT
CABLE HIGH FREQUENCY MONO STRZ (ELECTRODE) ×2 IMPLANT
CELLS DAT CNTRL 66122 CELL SVR (MISCELLANEOUS) IMPLANT
CHLORAPREP W/TINT 26ML (MISCELLANEOUS) ×2 IMPLANT
CLIP APPLIE 5 13 M/L LIGAMAX5 (MISCELLANEOUS) IMPLANT
DECANTER SPIKE VIAL GLASS SM (MISCELLANEOUS) ×2 IMPLANT
DERMABOND ADVANCED (GAUZE/BANDAGES/DRESSINGS) ×1
DERMABOND ADVANCED .7 DNX12 (GAUZE/BANDAGES/DRESSINGS) ×1 IMPLANT
DRAIN CHANNEL 19F RND (DRAIN) IMPLANT
DRAPE LAPAROSCOPIC ABDOMINAL (DRAPES) ×2 IMPLANT
DRAPE SURG IRRIG POUCH 19X23 (DRAPES) ×1 IMPLANT
DRSG OPSITE POSTOP 4X10 (GAUZE/BANDAGES/DRESSINGS) IMPLANT
DRSG OPSITE POSTOP 4X6 (GAUZE/BANDAGES/DRESSINGS) IMPLANT
DRSG OPSITE POSTOP 4X8 (GAUZE/BANDAGES/DRESSINGS) ×1 IMPLANT
ELECT PENCIL ROCKER SW 15FT (MISCELLANEOUS) ×3 IMPLANT
ELECT REM PT RETURN 15FT ADLT (MISCELLANEOUS) ×2 IMPLANT
EVACUATOR SILICONE 100CC (DRAIN) IMPLANT
GAUZE SPONGE 4X4 12PLY STRL (GAUZE/BANDAGES/DRESSINGS) IMPLANT
GLOVE BIO SURGEON STRL SZ 6.5 (GLOVE) ×4 IMPLANT
GLOVE BIOGEL PI IND STRL 7.0 (GLOVE) ×2 IMPLANT
GLOVE BIOGEL PI INDICATOR 7.0 (GLOVE) ×2
GOWN STRL REUS W/TWL 2XL LVL3 (GOWN DISPOSABLE) ×4 IMPLANT
GOWN STRL REUS W/TWL XL LVL3 (GOWN DISPOSABLE) ×8 IMPLANT
GRASPER ENDOPATH ANVIL 10MM (MISCELLANEOUS) IMPLANT
HOLDER FOLEY CATH W/STRAP (MISCELLANEOUS) ×2 IMPLANT
IRRIG SUCT STRYKERFLOW 2 WTIP (MISCELLANEOUS) ×2
IRRIGATION SUCT STRKRFLW 2 WTP (MISCELLANEOUS) ×1 IMPLANT
LUBRICANT JELLY K Y 4OZ (MISCELLANEOUS) ×1 IMPLANT
PACK COLON (CUSTOM PROCEDURE TRAY) ×1 IMPLANT
PACK LAPAROSCOPY W LONG (CUSTOM PROCEDURE TRAY) ×1 IMPLANT
PAD POSITIONING PINK XL (MISCELLANEOUS) ×2 IMPLANT
PORT LAP GEL ALEXIS MED 5-9CM (MISCELLANEOUS) ×1 IMPLANT
POSITIONER SURGICAL ARM (MISCELLANEOUS) ×2 IMPLANT
POUCH OSTOMY 2 3/4  H 3804 (WOUND CARE) ×1
POUCH OSTOMY 2 3/4 H 3804 (WOUND CARE) ×1
POUCH OSTOMY 2 PC DRNBL 2.75 (WOUND CARE) IMPLANT
RETRACTOR WND ALEXIS 18 MED (MISCELLANEOUS) IMPLANT
RTRCTR WOUND ALEXIS 18CM MED (MISCELLANEOUS)
SCISSORS LAP 5X35 DISP (ENDOMECHANICALS) ×2 IMPLANT
SEALER TISSUE G2 STRG ARTC 35C (ENDOMECHANICALS) IMPLANT
SLEEVE XCEL OPT CAN 5 100 (ENDOMECHANICALS) ×2 IMPLANT
SPONGE DRAIN TRACH 4X4 STRL 2S (GAUZE/BANDAGES/DRESSINGS) IMPLANT
SPONGE LAP 18X18 RF (DISPOSABLE) ×2 IMPLANT
STAPLER CUT CVD 40MM GREEN (STAPLE) ×1 IMPLANT
STAPLER VISISTAT 35W (STAPLE) ×1 IMPLANT
SUT ETHILON 2 0 PS N (SUTURE) IMPLANT
SUT NOVA NAB DX-16 0-1 5-0 T12 (SUTURE) IMPLANT
SUT NOVA NAB GS-21 0 18 T12 DT (SUTURE) ×2 IMPLANT
SUT PDS AB 1 CTX 36 (SUTURE) ×2 IMPLANT
SUT PDS AB 1 TP1 96 (SUTURE) IMPLANT
SUT PROLENE 2 0 KS (SUTURE) ×1 IMPLANT
SUT PROLENE 2 0 SH DA (SUTURE) ×1 IMPLANT
SUT SILK 2 0 (SUTURE) ×2
SUT SILK 2 0 SH CR/8 (SUTURE) ×2 IMPLANT
SUT SILK 2-0 18XBRD TIE 12 (SUTURE) ×1 IMPLANT
SUT SILK 3 0 (SUTURE) ×2
SUT SILK 3 0 SH CR/8 (SUTURE) ×2 IMPLANT
SUT SILK 3-0 18XBRD TIE 12 (SUTURE) ×1 IMPLANT
SUT VIC AB 2-0 SH 18 (SUTURE) ×4 IMPLANT
SUT VIC AB 4-0 PS2 27 (SUTURE) ×1 IMPLANT
SYR BULB IRRIGATION 50ML (SYRINGE) ×1 IMPLANT
SYS LAPSCP GELPORT 120MM (MISCELLANEOUS)
SYSTEM LAPSCP GELPORT 120MM (MISCELLANEOUS) IMPLANT
TOWEL OR NON WOVEN STRL DISP B (DISPOSABLE) ×2 IMPLANT
TRAY FOLEY MTR SLVR 16FR STAT (SET/KITS/TRAYS/PACK) ×1 IMPLANT
TROCAR BLADELESS OPT 5 100 (ENDOMECHANICALS) ×2 IMPLANT
TROCAR XCEL BLUNT TIP 100MML (ENDOMECHANICALS) ×1 IMPLANT
TUBING CONNECTING 10 (TUBING) ×1 IMPLANT
TUBING INSUF HEATED (TUBING) ×2 IMPLANT
YANKAUER SUCT BULB TIP 10FT TU (MISCELLANEOUS) ×1 IMPLANT

## 2017-12-13 NOTE — Anesthesia Procedure Notes (Signed)
Procedure Name: Intubation Performed by: Janeth Terry D, CRNA Pre-anesthesia Checklist: Patient identified, Emergency Drugs available, Suction available and Patient being monitored Patient Re-evaluated:Patient Re-evaluated prior to induction Oxygen Delivery Method: Circle system utilized Preoxygenation: Pre-oxygenation with 100% oxygen Induction Type: IV induction, Rapid sequence and Cricoid Pressure applied Laryngoscope Size: Mac and 4 Grade View: Grade I Tube type: Oral Tube size: 7.5 mm Number of attempts: 1 Airway Equipment and Method: Stylet Placement Confirmation: ETT inserted through vocal cords under direct vision,  positive ETCO2 and breath sounds checked- equal and bilateral Secured at: 26 cm Tube secured with: Tape Dental Injury: Teeth and Oropharynx as per pre-operative assessment

## 2017-12-13 NOTE — Anesthesia Postprocedure Evaluation (Signed)
Anesthesia Post Note  Patient: Manuel [name]  Procedure(s) Performed: LAPAROSCOPIC END COLOSTOMY HARTMAN'S PROCEDURE (N/A Abdomen)     Patient location during evaluation: PACU Anesthesia Type: General Level of consciousness: awake and alert Pain management: pain level controlled Vital Signs Assessment: post-procedure vital signs reviewed and stable Respiratory status: spontaneous breathing, nonlabored ventilation, respiratory function stable and patient connected to nasal cannula oxygen Cardiovascular status: blood pressure returned to baseline and stable Postop Assessment: no apparent nausea or vomiting Anesthetic complications: no    Last Vitals:  Vitals:   12/13/17 1715 12/13/17 1730  BP: 102/74 120/76  Pulse: 98 (!) 116  Resp: (!) 22 17  Temp:    SpO2: 90% 90%    Last Pain:  Vitals:   12/13/17 1715  TempSrc:   PainSc: 2                  Lynda Rainwater

## 2017-12-13 NOTE — Progress Notes (Signed)
Dr Sabra Heck aware has o2 sats at 90 % on 2L [place].  States pt may return to room with cont pulse ox.

## 2017-12-13 NOTE — Progress Notes (Signed)
[facility] Progress Note  Patient: Manuel [name] TDD:[id number]   PCP: Jani Gravel, MD DOB: May 08, 1946   DOA: 12/12/2017   DOS: 12/13/2017   Date of Service: the patient was seen and examined on 12/13/2017  Subjective: Seen before surgery, still has abdominal distention no acute complaint.  Brief hospital course: Atri Fib, OSA, DM, HTN, who presented to the Ed at [facility] with complaints of constipation that started a ~week ago, Thursday, but then he was able to have a small bowel movement ~5 days ago, none since. Patient underwent flexible sigmoidoscopy which found that patient had a diverticular stricture.  CT scan already showed patient had pneumatosis and surgery to the patient to OR for Hartman's procedure with colostomy and colectomy. Currently further plan is monitor postoperative recovery the stepdown unit.  Assessment and Plan: 1.  Abdominal pain with severe constipation. Pneumatosis of the colon. Diverticular stricture. S/P flexible sigmoidoscopy. S/P Hartmann procedure with colectomy and colostomy creation. Appreciate assistance from GI as well as general surgery. Continue with gentle IV hydration for now. Follow-up on recommendation of surgery for postoperative care. Continue antibiotics for now. Continue lactic IV fluid. N.p.o. mostly.  2.  A. fib with RVR. Hypotension. Intraoperatively patient developed some RVR as well as hypotension. With concern for worsening overnight patient will be transferred to stepdown unit for close monitoring. Continue IV hydration. Patient is on oral Cardizem at home 120 mg daily. We will start him on low-dose Cardizem infusion while the patient remains n.p.o. at present. Infusion will be continuous without any titration. Anticoagulation Eliquis is currently on hold.  3.  BPH. Monitor for retention for now. Doxazosin was on hold.  4.  Type 2 diabetes mellitus. Controlled for now. While the patient is n.p.o. we will continue with  sliding scale every 4 hours. Hold metformin.  Diet: N.p.o. DVT Prophylaxis: subcutaneous Heparin  Advance goals of care discussion: full code  Family Communication: family was present at bedside, at the time of interview. The pt provided permission to discuss medical plan with the family. Opportunity was given to ask question and all questions were answered satisfactorily.   Disposition:  Discharge to home.  Consultants: gastroenterology General surgery  Procedures: flex sig   Antibiotics: Anti-infectives (From admission, onward)   Start     Dose/Rate Route Frequency Ordered Stop   12/13/17 1400  cefoTEtan (CEFOTAN) 2 g in sodium chloride 0.9 % 100 mL IVPB    Note to Pharmacy:  Pharmacy may adjust dose strength for optimal dosing.   Send with patient on call to the OR.  Anesthesia to complete antibiotic administration <67mn [facility]   2 g 200 mL/hr over 30 Minutes Intravenous To Surgery 12/13/17 1105 12/14/17 1400   12/12/17 2200  metroNIDAZOLE (FLAGYL) IVPB 500 mg     500 mg 100 mL/hr over 60 Minutes Intravenous Every 8 hours 12/12/17 2109         Objective: Physical Exam: Vitals:   12/13/17 1730 12/13/17 1753 12/13/17 1806 12/13/17 1824  BP: 120/76 129/81 125/88 120/75  Pulse: (!) 116 98 (!) 105 (!) 103  Resp: _0 Temp:  97.6 F (36.4 C)    TempSrc:      SpO2: 90% 90% 93% 96%  Weight:      Height:        Intake/Output Summary (Last 24 hours) at 12/13/2017 1839 Last data filed at 12/13/2017 1824 Gross per 24 hour  Intake 2706.66 ml  Output 775  ml  Net 1931.66 ml   Filed Weights   12/12/17 1054 12/13/17 0850  Weight: 100.7 kg (222 lb) 100.7 kg (222 lb)   General: Alert, Awake and Oriented to Time, Place and Person. Appear in mild distress, affect appropriate Eyes: PERRL, Conjunctiva normal ENT: Oral Mucosa clear moist. Neck: no JVD, no Abnormal Mass Or lumps Cardiovascular: S1 and S2 Present, no Murmur, Peripheral Pulses  Present Respiratory: normal respiratory effort, Bilateral Air entry equal and Decreased, no use of accessory muscle, Clear to Auscultation, no Crackles, no wheezes Abdomen: Bowel Sound present, Soft and no tenderness, no hernia Skin: no redness, no Rash, no induration Extremities: no Pedal edema, no calf tenderness Neurologic: Grossly no focal neuro deficit. Bilaterally Equal motor strength  Data Reviewed: CBC: Recent Labs  Lab 12/12/17 1335 12/13/17 0421  WBC 5.2 5.3  NEUTROABS 3.9  --   HGB 15.3 15.5  HCT 45.4 49.0  MCV 88.0 91.2  PLT 209 034   Basic Metabolic Panel: Recent Labs  Lab 12/12/17 1335 12/13/17 0421  NA 136 140  K 3.7 4.0  CL 104 104  CO2 22 26  GLUCOSE 85 79  BUN 27* 27*  CREATININE 1.23 1.17  CALCIUM 8.3* 8.4*    Liver Function Tests: Recent Labs  Lab 12/12/17 1335  AST 17  ALT 15*  ALKPHOS 70  BILITOT 1.4*  PROT 6.4*  ALBUMIN 3.3*   Recent Labs  Lab 12/12/17 1335  LIPASE 21   No results for input(s): AMMONIA in the last 168 hours. Coagulation Profile: Recent Labs  Lab 12/12/17 1335  INR 1.35   Cardiac Enzymes: No results for input(s): CKTOTAL, CKMB, CKMBINDEX, TROPONINI in the last 168 hours. BNP (last 3 results) No results for input(s): PROBNP in the last 8760 hours. CBG: Recent Labs  Lab 12/12/17 2254 12/13/17 0733 12/13/17 0847 12/13/17 1238 12/13/17 1319  GLUCAP 81 62* 97 67 95   Studies: No results found.  Scheduled Meds: . diltiazem  120 mg Oral Daily  . doxazosin  8 mg Oral Daily  . [START ON 12/14/2017] enoxaparin (LOVENOX) injection  40 mg Subcutaneous Q24H  . HYDROmorphone      . insulin aspart  0-9 Units Subcutaneous TID WC  . pravastatin  20 mg Oral Daily   Continuous Infusions: . cefoTEtan (CEFOTAN) IV    . lactated ringers 75 mL/hr at 12/13/17 1330  . methocarbamol (ROBAXIN)  IV    . metronidazole 100 mL/hr at 12/13/17 1347   PRN Meds: methocarbamol (ROBAXIN)  IV, morphine injection, ondansetron  **OR** ondansetron (ZOFRAN) IV  Time spent: 35 minutes  Author: Berle Mull, MD [facility] Pager: [phone] 12/13/2017 6:39 PM  If 7PM-7AM, please contact night-coverage at [email or web address], password [facility]

## 2017-12-13 NOTE — Progress Notes (Signed)
On arrival to pacu, NG noted by CRNA coiled in pt's mouth and removed. Attempted to re insert unsuccessfully. Dr Marcello Moores made aware. Instructed to leave NG out at this time.

## 2017-12-13 NOTE — H&P (Signed)
Reason for Consult: Abnormal CT scan Referring Physician: [facility]  [name] HPI: This is a 72 year old [name] with multiple medical problems who presents to the hospital with complaints of worsening abdominal distension.  His symptoms started approximately 10 days ago, but in retrospect, he feels that his symptoms started earlier.  The symptoms at that earlier time were much milder.  He developed a gradual abdominal distension without pain or fever.  He was evaluated by Dr. Maudie Mercury, his PCP, and a CT scan was ordered.  The scan revealed a distended colon with right sided pneumatosis, large stool burden in the right colon, and a possible stenosis at the level of the mid sigmoid colon.  The sigmoid colon abnormality was noted during his most recent colonoscopy on 03/2016.  He was identified to have several adenomas, but it was a difficult procedure.  The colonoscope had difficulty traversing the sigmoid colon and there was a suspicion of an abdominal hernia, but none was palpated externally.  The colonoscopy was successfully completed and he did not have any complications from the procedure.  Dr. Maudie Mercury did treat him with metronidazole, but after one dose he presented to the ER.  The patient felt that his symptoms were worsening in that his distension was increasing.  Again, he denied any abdominal pain or fever.  Work up in the ER was negative for any elevated WBC and surgery recommended conservative management.  Past Medical History:  Diagnosis Date  . A-fib ([place])   . BPH (benign prostatic hyperplasia)   . Diabetes mellitus   . Dizziness and giddiness   . Dyslipidemia   . Hypertension   . Left carotid bruit   . OSA (obstructive sleep apnea)   . S/P colonoscopy     Past Surgical History:  Procedure Laterality Date  . CARDIOVERSION  12/18/2011   Procedure: CARDIOVERSION;  Surgeon: Laverda Page, MD;  Location: [facility];  Service: Cardiovascular;  Laterality: N/A;  . CYSTOSTOMY     off  back  . HERNIA REPAIR    . 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 Cancer Father   . Alzheimer's disease Father   . Cancer Mother   . Kidney disease Paternal Aunt     Social History:  reports that he has quit smoking. He has never used smokeless tobacco. He reports that he does not drink alcohol or use drugs.  Allergies:  Allergies  Allergen Reactions  . Sulfa Antibiotics Other (See Comments)    Headaches     Medications:  Scheduled: . [MAR Hold] diltiazem  120 mg Oral Daily  . [MAR Hold] doxazosin  8 mg Oral Daily  . [MAR Hold] insulin aspart  0-9 Units Subcutaneous TID WC  . [MAR Hold] pravastatin  20 mg Oral Daily   Continuous: . sodium chloride    . 0.9 % NaCl with KCl 20 mEq / L 100 mL/hr at 12/13/17 0013  . [MAR Hold] metronidazole Stopped (12/13/17 4259)    Results for orders placed or performed during the hospital encounter of 12/12/17 (from the past 24 hour(s))  Comprehensive metabolic panel     Status: Abnormal   Collection Time: 12/12/17  1:35 PM  Result Value Ref Range   Sodium 136 135 - 145 mmol/L   Potassium 3.7 3.5 - 5.1 mmol/L   Chloride 104 101 - 111 mmol/L   CO2 22 22 -  32 mmol/L   Glucose, Bld 85 65 - 99 mg/dL   BUN 27 (H) 6 - 20 mg/dL   Creatinine, Ser 1.23 0.61 - 1.24 mg/dL   Calcium 8.3 (L) 8.9 - 10.3 mg/dL   Total Protein 6.4 (L) 6.5 - 8.1 g/dL   Albumin 3.3 (L) 3.5 - 5.0 g/dL   AST 17 15 - 41 U/L   ALT 15 (L) 17 - 63 U/L   Alkaline Phosphatase 70 38 - 126 U/L   Total Bilirubin 1.4 (H) 0.3 - 1.2 mg/dL   GFR calc non Af Amer 57 (L) >60 mL/min   GFR calc Af Amer >60 >60 mL/min   Anion gap 10 5 - 15  Lipase, blood     Status: None   Collection Time: 12/12/17  1:35 PM  Result Value Ref Range   Lipase 21 11 - 51 U/L  CBC with Differential     Status: None   Collection Time: 12/12/17  1:35 PM  Result Value Ref Range   WBC 5.2 4.0 - 10.5 K/uL   RBC 5.16 4.22 - 5.81  MIL/uL   Hemoglobin 15.3 13.0 - 17.0 g/dL   HCT 45.4 39.0 - Manuel.0 %   MCV 88.0 78.0 - 100.0 fL   MCH 29.7 26.0 - 34.0 pg   MCHC 33.7 30.0 - 36.0 g/dL   RDW 15.4 11.5 - 15.5 %   Platelets 209 150 - 400 K/uL   Neutrophils Relative % 75 %   Neutro Abs 3.9 1.7 - 7.7 K/uL   Lymphocytes Relative 15 %   Lymphs Abs 0.8 0.7 - 4.0 K/uL   Monocytes Relative 9 %   Monocytes Absolute 0.5 0.1 - 1.0 K/uL   Eosinophils Relative 1 %   Eosinophils Absolute 0.1 0.0 - 0.7 K/uL   Basophils Relative 0 %   Basophils Absolute 0.0 0.0 - 0.1 K/uL  Protime-INR     Status: Abnormal   Collection Time: 12/12/17  1:35 PM  Result Value Ref Range   Prothrombin Time 16.6 (H) 11.4 - 15.2 seconds   INR 1.35   Urinalysis, Routine w reflex microscopic     Status: Abnormal   Collection Time: 12/12/17  1:35 PM  Result Value Ref Range   Color, Urine YELLOW YELLOW   APPearance CLEAR CLEAR   Specific Gravity, Urine >1.030 (H) 1.005 - 1.030   pH 5.5 5.0 - 8.0   Glucose, UA NEGATIVE NEGATIVE mg/dL   Hgb urine dipstick NEGATIVE NEGATIVE   Bilirubin Urine SMALL (A) NEGATIVE   Ketones, ur NEGATIVE NEGATIVE mg/dL   Protein, ur 100 (A) NEGATIVE mg/dL   Nitrite NEGATIVE NEGATIVE   Leukocytes, UA NEGATIVE NEGATIVE  Urinalysis, Microscopic (reflex)     Status: Abnormal   Collection Time: 12/12/17  1:35 PM  Result Value Ref Range   RBC / HPF 0-5 0 - 5 RBC/hpf   WBC, UA 0-5 0 - 5 WBC/hpf   Bacteria, UA FEW (A) NONE SEEN   Squamous Epithelial / LPF 0-5 0 - 5   Hyaline Casts, UA PRESENT    Granular Casts, UA PRESENT   I-Stat CG4 Lactic Acid, ED     Status: None   Collection Time: 12/12/17  1:43 PM  Result Value Ref Range   Lactic Acid, Venous 0.84 0.5 - 1.9 mmol/L  I-Stat CG4 Lactic Acid, ED     Status: None   Collection Time: 12/12/17  4:03 PM  Result Value Ref Range   Lactic Acid, Venous 1.49 0.5 -  1.9 mmol/L  I-Stat CG4 Lactic Acid, ED     Status: None   Collection Time: 12/12/17  6:35 PM  Result Value Ref Range    Lactic Acid, Venous 1.36 0.5 - 1.9 mmol/L  Glucose, capillary     Status: None   Collection Time: 12/12/17 10:54 PM  Result Value Ref Range   Glucose-Capillary 81 65 - 99 mg/dL  Basic metabolic panel     Status: Abnormal   Collection Time: 12/13/17  4:21 AM  Result Value Ref Range   Sodium 140 135 - 145 mmol/L   Potassium 4.0 3.5 - 5.1 mmol/L   Chloride 104 101 - 111 mmol/L   CO2 26 22 - 32 mmol/L   Glucose, Bld 79 65 - 99 mg/dL   BUN 27 (H) 6 - 20 mg/dL   Creatinine, Ser 1.17 0.61 - 1.24 mg/dL   Calcium 8.4 (L) 8.9 - 10.3 mg/dL   GFR calc non Af Amer >60 >60 mL/min   GFR calc Af Amer >60 >60 mL/min   Anion gap 10 5 - 15  CBC     Status: Abnormal   Collection Time: 12/13/17  4:21 AM  Result Value Ref Range   WBC 5.3 4.0 - 10.5 K/uL   RBC 5.37 4.22 - 5.81 MIL/uL   Hemoglobin 15.5 13.0 - 17.0 g/dL   HCT 49.0 39.0 - Manuel.0 %   MCV 91.2 78.0 - 100.0 fL   MCH 28.9 26.0 - 34.0 pg   MCHC 31.6 30.0 - 36.0 g/dL   RDW 15.6 (H) 11.5 - 15.5 %   Platelets 235 150 - 400 K/uL  Glucose, capillary     Status: Abnormal   Collection Time: 12/13/17  7:33 AM  Result Value Ref Range   Glucose-Capillary 62 (L) 65 - 99 mg/dL  Glucose, capillary     Status: None   Collection Time: 12/13/17  8:47 AM  Result Value Ref Range   Glucose-Capillary 97 65 - 99 mg/dL     Ct Abdomen Pelvis W Contrast  Result Date: 12/11/2017 CLINICAL DATA:  Left lower quadrant and mid abdominal pain. Constipation. EXAM: CT ABDOMEN AND PELVIS WITH CONTRAST TECHNIQUE: Multidetector CT imaging of the abdomen and pelvis was performed using the standard protocol following bolus administration of intravenous contrast. CONTRAST:  141m ISOVUE-300 IOPAMIDOL (ISOVUE-300) INJECTION 61% COMPARISON:  None. FINDINGS: Lower chest: Subsegmental atelectasis or scarring in the lung bases. Cardiomegaly. No effusions. Hepatobiliary: No focal hepatic abnormality. Gallbladder unremarkable. Pancreas: No focal abnormality or ductal dilatation.  Spleen: No focal abnormality.  Normal size. Adrenals/Urinary Tract: Small nodule in the left adrenal gland measures 15 mm, nonspecific. Absence of the left kidney. Right kidney is unremarkable. No hydronephrosis. Urinary bladder unremarkable. Stomach/Bowel: There is marked gaseous distention of the colon with large amount of stool and gas in the colon to the level of the mid sigmoid colon. There is pneumatosis within the wall of the cecum and ascending colon several small bowel loops are dilated and fluid-filled as well. Stomach is decompressed, unremarkable. Sigmoid diverticulosis. No active diverticulitis. Vascular/Lymphatic: No evidence of aneurysm or adenopathy. Mesenteric vessels are widely patent. Reproductive: No visible focal abnormality. Other: Small amount of free fluid in the pelvis and right paracolic gutter. Musculoskeletal: Diffuse degenerative changes in the lumbar spine. IMPRESSION: Marked gaseous distention of the colon to the level of the mid sigmoid colon where there is sigmoid diverticulosis and mild wall thickening which could be related to diverticulosis and muscular hypertrophy or could reflect an annular  colonic lesion. In addition, there is pneumatosis within the right colon wall. Large stool burden in the right colon, transverse colon and descending colon. Small bowel loops are also mild to moderately dilated. Absence of the left kidney. Small left adrenal nodule, 15 mm, nonspecific. Small amount of free fluid in the right lower quadrant and cul-de-sac. These results were called by telephone at the time of interpretation on 12/11/2017 at 1:55 pm to Dr. Jani Gravel , who verbally acknowledged these results. Electronically Signed   By: Rolm Baptise M.D.   On: 12/11/2017 13:53    ROS:  As stated above in the HPI otherwise negative.  Blood pressure (!) 141/92, pulse 87, temperature (!) 97.5 F (36.4 C), temperature source Oral, resp. rate 20, height 6' 3" (1.905 m), weight 100.7 kg (222 lb),  SpO2 92 %.    PE: Gen: NAD, Alert and Oriented HEENT:  [place]/AT, EOMI Neck: Supple, no LAD Lungs: CTA Bilaterally CV: RRR without M/G/R ABM: Mildly soft, distended, nontender, +BS, but very faint Ext: No C/C/E  Assessment/Plan: 1) ? Sigmoid colon lesion. 2) Significant abdominal distension.   With the current findings of the CT scan in relationship with the prior colonoscopy 03/2016, the source for his colonic distension is the sigmoid colon.  My suspicion is that it is from diverticular disease as the recent colonoscopy was negative for any overt colon cancer.  Plan: 1) FFS.  Lakesia Dahle D 12/13/2017, 9:02 AM

## 2017-12-13 NOTE — Op Note (Signed)
[facility] Patient Name: Manuel [name] Procedure Date: 12/13/2017 MRN: [id number] Attending MD: Carol Ada , MD Date of Birth: May 03, 1946 CSN: [id number] Age: 72 Admit Type: Inpatient Procedure:                Flexible Sigmoidoscopy Indications:              Abnormal CT of the GI tract Providers:                Carol Ada, MD, Elmer Ramp. Tilden Dome, RN, Cletis Athens,                            Technician Referring MD:              Medicines:                None Complications:            No immediate complications. Estimated Blood Loss:     Estimated blood loss: none. Procedure:                Pre-Anesthesia Assessment:                           - Prior to the procedure, a History and Physical                            was performed, and patient medications and                            allergies were reviewed. The patient's tolerance of                            previous anesthesia was also reviewed. The risks                            and benefits of the procedure and the sedation                            options and risks were discussed with the patient.                            All questions were answered, and informed consent                            was obtained. Prior Anticoagulants: The patient has                            taken no previous anticoagulant or antiplatelet                            agents. ASA Grade Assessment: III - A patient with                            severe systemic disease. After reviewing the risks  and benefits, the patient was deemed in                            satisfactory condition to undergo the procedure.                           After obtaining informed consent, the scope was                            passed under direct vision. The [id number] ([id number])                            scope was introduced through the anus and advanced                            to the the descending colon. The  flexible                            sigmoidoscopy was technically difficult and                            complex. The quality of the bowel preparation was                            adequate. Scope In: Scope Out: Findings:      A benign-appearing, intrinsic stenosis measuring 5-10 cm (in length) was       found in the sigmoid colon and was traversed.      The procedure was performed unprepped. With the pneumatosis and the       severe colonic distension a water infusion technique was used to advance       the endoscope. The area of abnormality was encountered at approximately       20 cm and extended up to 30 cm. The current finding is highly suggestive       of a diverticular stenosis and the severity of the stenosis varies       between moderate to severe. The majority of the stenosis was from       muscular hypertrophy, which also caused the area to be very torturous.       Multiple diverticula were encountered and there was the suggestion of a       diverticulitis as some pus was noted, however, the origin of this pus       was not identified. There was no evidence of any malignancy. Once this       area was traversed cavernous colonic distension was encountered.       Attempts to decompress the colon failed as there was a significant       amount of thick liquid and solid stool. The procedure did not worsen the       patient's abdominal distension and he made no complaints about abdominal       pain. Impression:               - Stricture in the sigmoid colon.                           - Diverticula.                           -  No specimens collected. Moderate Sedation:      N/A- Per Anesthesia Care Recommendation:           - Return patient to hospital ward for ongoing care.                           - NPO.                           - Surgical intervention. Discussed with surgical PA. Procedure Code(s):        --- Professional ---                           [phone],  Sigmoidoscopy, flexible; diagnostic,                            including collection of specimen(s) by brushing or                            washing, when performed (separate procedure) Diagnosis Code(s):        --- Professional ---                           Y24.175, Other intestinal obstruction unspecified                            as to partial versus complete obstruction                           R93.3, Abnormal findings on diagnostic imaging of                            other parts of digestive tract CPT copyright 2017 [employer]. All rights reserved. The codes documented in this report are preliminary and upon coder review may  be revised to meet current compliance requirements. Carol Ada, MD Carol Ada, MD 12/13/2017 9:49:53 AM This report has been signed electronically. Number of Addenda: 0

## 2017-12-13 NOTE — Transfer of Care (Signed)
Immediate Anesthesia Transfer of Care Note  Patient: Manuel [name]  Procedure(s) Performed: LAPAROSCOPIC END COLOSTOMY HARTMAN'S PROCEDURE (N/A Abdomen)  Patient Location: PACU  Anesthesia Type:General  Level of Consciousness: awake, alert  and oriented  Airway & Oxygen Therapy: Patient Spontanous Breathing and Patient connected to face mask oxygen  Post-op Assessment: Report given to RN and Post -op Vital signs reviewed and stable  Post vital signs: Reviewed and stable  Last Vitals:  Vitals Value Taken Time  BP 128/75 12/13/2017  4:18 PM  Temp    Pulse 85 12/13/2017  4:24 PM  Resp 18 12/13/2017  4:24 PM  SpO2 100 % 12/13/2017  4:24 PM  Vitals shown include unvalidated device data.  Last Pain:  Vitals:   12/13/17 1327  TempSrc: Oral  PainSc:          Complications: No apparent anesthesia complications

## 2017-12-13 NOTE — Progress Notes (Addendum)
[facility] Progress Note     Subjective: CC:  This is a pleasant gentleman who reports a 6-day history of constipation.  He currently endorses abdominal bloating and discomfort with mild left lower quadrant pain. His last bowel movement was 12/07/2017 and was smaller in caliber, not much larger than a pencil, compared to normal.  Prior to this time the patient reports daily bowel movements.  Associated symptoms include abdominal bloating, decreased appetite, and one episode of nausea.  Patient denies vomiting or blood in his stool.  Reports last colonoscopy was 3 years ago and included removal of benign polyps.  He denies a personal history of diverticulitis or cancer.  Objective: Vital signs in last 24 hours: Temp:  [97.5 F (36.4 C)-98.1 F (36.7 C)] 97.5 F (36.4 C) (05/24 0513) Pulse Rate:  [80-103] 82 (05/24 0513) Resp:  [14-20] 16 (05/24 0513) BP: (100-135)/(65-92) 117/65 (05/24 0513) SpO2:  [90 %-98 %] 98 % (05/24 0513) Weight:  [100.7 kg (222 lb)] 100.7 kg (222 lb) (05/23 1054) Last BM Date: 12/08/17  Intake/Output from previous day: 05/23 0701 - 05/24 0700 In: 1056.7 [I.V.:756.7; IV Piggyback:300] Out: 100 [Urine:100] Intake/Output this shift: No intake/output data recorded.  PE: Gen:  Alert, NAD, pleasant and cooperative HEENT: No NG tube in place Card:  Regular rate and rhythm, pedal pulses 2+ BL Pulm:  Normal effort, clear to auscultation bilaterally Abd: Soft,  moderately distended, hypoactive bowel sounds, mild TTP LLQ, no peritonitis. Skin: warm and dry, no rashes  Psych: A&Ox3   Lab Results:  Recent Labs    12/12/17 1335 12/13/17 0421  WBC 5.2 5.3  HGB 15.3 15.5  HCT 45.4 49.0  PLT 209 235   BMET Recent Labs    12/12/17 1335 12/13/17 0421  NA 136 140  K 3.7 4.0  CL 104 104  CO2 22 26  GLUCOSE 85 79  BUN 27* 27*  CREATININE 1.23 1.17  CALCIUM 8.3* 8.4*   PT/INR Recent Labs    12/12/17 1335  LABPROT 16.6*  INR 1.35    CMP     Component Value Date/Time   NA 140 12/13/2017 0421   K 4.0 12/13/2017 0421   CL 104 12/13/2017 0421   CO2 26 12/13/2017 0421   GLUCOSE 79 12/13/2017 0421   BUN 27 (H) 12/13/2017 0421   CREATININE 1.17 12/13/2017 0421   CREATININE 1.23 04/10/2011 1307   CALCIUM 8.4 (L) 12/13/2017 0421   PROT 6.4 (L) 12/12/2017 1335   ALBUMIN 3.3 (L) 12/12/2017 1335   AST 17 12/12/2017 1335   ALT 15 (L) 12/12/2017 1335   ALKPHOS 70 12/12/2017 1335   BILITOT 1.4 (H) 12/12/2017 1335   GFRNONAA >60 12/13/2017 0421   GFRAA >60 12/13/2017 0421   Lipase     Component Value Date/Time   LIPASE 21 12/12/2017 1335       Studies/Results: Ct Abdomen Pelvis W Contrast  Result Date: 12/11/2017 CLINICAL DATA:  Left lower quadrant and mid abdominal pain. Constipation. EXAM: CT ABDOMEN AND PELVIS WITH CONTRAST TECHNIQUE: Multidetector CT imaging of the abdomen and pelvis was performed using the standard protocol following bolus administration of intravenous contrast. CONTRAST:  116m ISOVUE-300 IOPAMIDOL (ISOVUE-300) INJECTION 61% COMPARISON:  None. FINDINGS: Lower chest: Subsegmental atelectasis or scarring in the lung bases. Cardiomegaly. No effusions. Hepatobiliary: No focal hepatic abnormality. Gallbladder unremarkable. Pancreas: No focal abnormality or ductal dilatation. Spleen: No focal abnormality.  Normal size. Adrenals/Urinary Tract: Small nodule in the left adrenal gland measures 15 mm, nonspecific.  Absence of the left kidney. Right kidney is unremarkable. No hydronephrosis. Urinary bladder unremarkable. Stomach/Bowel: There is marked gaseous distention of the colon with large amount of stool and gas in the colon to the level of the mid sigmoid colon. There is pneumatosis within the wall of the cecum and ascending colon several small bowel loops are dilated and fluid-filled as well. Stomach is decompressed, unremarkable. Sigmoid diverticulosis. No active diverticulitis. Vascular/Lymphatic: No  evidence of aneurysm or adenopathy. Mesenteric vessels are widely patent. Reproductive: No visible focal abnormality. Other: Small amount of free fluid in the pelvis and right paracolic gutter. Musculoskeletal: Diffuse degenerative changes in the lumbar spine. IMPRESSION: Marked gaseous distention of the colon to the level of the mid sigmoid colon where there is sigmoid diverticulosis and mild wall thickening which could be related to diverticulosis and muscular hypertrophy or could reflect an annular colonic lesion. In addition, there is pneumatosis within the right colon wall. Large stool burden in the right colon, transverse colon and descending colon. Small bowel loops are also mild to moderately dilated. Absence of the left kidney. Small left adrenal nodule, 15 mm, nonspecific. Small amount of free fluid in the right lower quadrant and cul-de-sac. These results were called by telephone at the time of interpretation on 12/11/2017 at 1:55 pm to Dr. Jani Gravel , who verbally acknowledged these results. Electronically Signed   By: Rolm Baptise M.D.   On: 12/11/2017 13:53    Anti-infectives: Anti-infectives (From admission, onward)   Start     Dose/Rate Route Frequency Ordered Stop   12/12/17 2200  metroNIDAZOLE (FLAGYL) IVPB 500 mg     500 mg 100 mL/hr over 60 Minutes Intravenous Every 8 hours 12/12/17 2109       Assessment/Plan A.fib - hold Eliquis  BPH DM2 HLD  OSA  Constipation - abnormal CT Abd/Pelv 5/22 w/ pneumatosis of R colon and colonic distention that decreases in the area of the sigmoid colon where there is wall thickening.  - afebrile, VSS, WBC 5.3 - continue serial abdominal exams; patient is distended but has a relatively benign exam this morning. - flex sig today by GI  - continue NPO - further surgical planning to follow flex sig  FEN: NPO, IVF ID: Flagyl 5/23 >> VTE: SCD's, chemical VTE held for procedure Foley: none     LOS: 0 days    Jill Alexanders ,  [facility] 12/13/2017, 7:38 AM Pager: [phone] Consults: [phone] Mon-Fri 7:00 am-4:30 pm Sat-Sun 7:00 am-11:30 am

## 2017-12-13 NOTE — Op Note (Signed)
12/12/2017 - 12/13/2017  4:08 PM  PATIENT:  Manuel [name]  72 y.o. male  Patient Care Team: Jani Gravel, MD as PCP - General (Internal Medicine) Adrian Prows, MD as Consulting Physician (Cardiology) Michael Boston, MD as Consulting Physician (General Surgery) Jackelyn Knife, MD as Rounding Team (Internal Medicine)  PRE-OPERATIVE DIAGNOSIS:  sigmoid stricture  POST-OPERATIVE DIAGNOSIS:  sigmoid stricture  PROCEDURE:   DIAGNOSTIC LAPAROSCOPY HARTMAN'S PROCEDURE with  END COLOSTOMY     Surgeon(s): Leighton Ruff, MD  ASSISTANT: none   ANESTHESIA:   general  EBL:  Total I/O In: 1250 [I.V.:1000; IV Piggyback:250] Out: 300 [Urine:200; Blood:100]  DRAINS: none   SPECIMEN:  Source of Specimen:  sigmoid stricture  DISPOSITION OF SPECIMEN:  PATHOLOGY  COUNTS:  YES  PLAN OF CARE: Pt already admitted  PATIENT DISPOSITION:  PACU - hemodynamically stable.  INDICATION: 72 year old male who presents to the hospital with complaints of worsening abdominal distention and pain.  He underwent a CT scan in the emergency department which showed pneumatosis of the right colon with a possible obstructing mass in the sigmoid colon.  He underwent a flexible sigmoidoscopy earlier today which showed a benign-appearing sigmoid stricture that was traversable with the colonoscope but large amounts of hard stool were noted proximal to this.  Given his CT findings I recommended an urgent resection of the sigmoid stricture with end colostomy and lapper scopic evaluation of his right colon.   OR FINDINGS: Right colon appeared pink and well perfused but significantly distended.  Sigmoid stricture at rectosigmoid junction  DESCRIPTION: the patient was identified in the preoperative holding area and taken to the OR where they were laid supine on the operating room table.  General anesthesia was induced without difficulty. SCDs were also noted to be in place prior to the initiation of anesthesia.  The  patient was then prepped and draped in the usual sterile fashion.   A surgical timeout was performed indicating the correct patient, procedure, positioning and need for preoperative antibiotics.    I began by making an infraumbilical incision dissecting down to the level of the fascia.  Fascia was elevated with Coker clamps and divided at midline.  The peritoneum was entered bluntly.  A Hassan port was placed into the abdomen and the abdomen was insufflated.  I was only able to get minimal visualization due to his significantly dilated colon.  Was able to get a 5 mm port in the left lower quadrant.  I use this in the midline port to evaluate the right colon.  The cecum and ascending colon appeared well perfused but dilated.  The hepatic flexure also appeared well perfused.  At this point I enlarged the midline incision and placed a Balfour retractor.  The sigmoid colon was mobilized off the white line of Toldt using electrocautery and blunt dissection.  Dissection was carried down into the pelvis until the stricture could be identified.  I elevated this up and separated the colon from the anterior abdominal peritoneum using Bovie electrocautery.  This allowed the sigmoid colon to come out of the wound.  I was able to get a green load contour stapler around the rectosigmoid junction.  I then began dividing the mesentery close to the colon wall to avoid any of the retroperitoneal structures.  This was divided using the laparoscopic Enseal device.  I mobilized the white line of Toldt out laterally mainly using blunt dissection.  There was some mesenteric bleeding which was controlled with the Enseal device and direct pressure.  Once the colon was freely mobilized I made a circular incision in the skin above the rectus muscle superior to the umbilicus.  Dissection was carried down through subcutaneous tissues to the level of the fascia.  The fascia was incised in a cruciate manner.  The rectus was split and the  peritoneum was divided with cautery.  The colostomy site was dilated to approximately 2 fingerbreadths.  I brought out the stapled end of the colon through the ostomy site and mobilized this through the abdominal wall to the level of well-perfused colon.  We then closed the fascia using #1 PDS running sutures.  The dermal layer was closed with a running 2-0 Vicryl suture.  The skin was closed with staples.  The remaining 5 mm port site was also closed with a staple.  We then covered the midline wound and I divided the colostomy proximal to the strictured portion.  This was sent to pathology for further examination.  There was good mesenteric bleeding within the remaining colon.  The colostomy was matured in standard Brooke fashion using 2-0 interrupted Vicryl sutures.  The colon was decompressed with suction.  A colostomy appliance was then placed.  A dressing was placed over the midline wound.  Patient also had an NG placed by anesthesia.  Per the report this was rather difficult and did cause some bleeding.  It was felt to be in the correct place but I was unable to confirm this due to his dilated transverse colon.  The patient was then awakened from anesthesia and sent to the postanesthesia care unit in stable condition.  All counts were correct per operating room staff.

## 2017-12-13 NOTE — Anesthesia Preprocedure Evaluation (Addendum)
Anesthesia Evaluation  Patient identified by MRN, date of birth, ID band Patient awake    Reviewed: Allergy & Precautions, NPO status , Patient's Chart, lab work & pertinent test results  Airway Mallampati: I  TM Distance: >3 FB Neck ROM: Full    Dental  (+) Upper Dentures, Partial Lower   Pulmonary sleep apnea , former smoker,    breath sounds clear to auscultation       Cardiovascular hypertension, Pt. on medications + dysrhythmias Atrial Fibrillation  Rhythm:Irregular Rate:Tachycardia     Neuro/Psych negative neurological ROS  negative psych ROS   GI/Hepatic negative GI ROS, Neg liver ROS,   Endo/Other  diabetes, Type 2, Oral Hypoglycemic Agents  Renal/GU      Musculoskeletal   Abdominal (+)  Abdomen: tender.    Peds  Hematology negative hematology ROS (+)   Anesthesia Other Findings   Reproductive/Obstetrics                            Anesthesia Physical Anesthesia Plan  ASA: III  Anesthesia Plan: General   Post-op Pain Management:    Induction: Intravenous, Rapid sequence and Cricoid pressure planned  PONV Risk Score and Plan: 3 and Ondansetron, Dexamethasone and Treatment may vary due to age or medical condition  Airway Management Planned: Oral ETT  Additional Equipment: None  Intra-op Plan:   Post-operative Plan: Extubation in OR  Informed Consent: I have reviewed the patients History and Physical, chart, labs and discussed the procedure including the risks, benefits and alternatives for the proposed anesthesia with the patient or authorized representative who has indicated his/her understanding and acceptance.   Dental advisory given  Plan Discussed with: CRNA  Anesthesia Plan Comments:        Anesthesia Quick Evaluation

## 2017-12-13 NOTE — Progress Notes (Signed)
Pt transferred to ICU at 1850. Pt oriented to room. Bed in lowest and locked position. Call light within reach.

## 2017-12-13 NOTE — Progress Notes (Signed)
Nutrition Brief Note  Patient identified on the Malnutrition Screening Tool (MST) Report.  72 year old male who presented to the ED with complaints of constipation x 6 days. Pt found to have severe colonic dilation and pneumatosis of the bowel wall. PMH significant for diabetes mellitus, atrial fibrillation, dyslipidemia, and hypertension.  Spoke with pt and his wife at bedside. Pt reports having a poor appetite over the past week due to constipation, feeling full, and feeling bloated. Pt reports typically having a good appetite and eating 3 meals daily. Breakfast may include a biscuit from a restaurant. Lunch may include a sandwich. Dinner includes a meat, 2 vegetables, and bread.  Pt denies any recent weight loss and states his UBW is 228-230 lbs.  Wt Readings from Last 15 Encounters:  12/13/17 222 lb (100.7 kg)  02/28/17 220 lb (99.8 kg)  02/28/16 229 lb (103.9 kg)  09/07/15 238 lb (108 kg)  03/24/15 234 lb (106.1 kg)  12/18/11 230 lb (104.3 kg)  04/26/11 231 lb 6.4 oz (105 kg)  04/10/11 232 lb 3.2 oz (105.3 kg)    Body mass index is 27.75 kg/m. Patient meets criteria for Overweight based on current BMI.   Current diet order is NPO as pt awaits surgery this afternoon. Labs and medications reviewed.   No nutrition interventions warranted at this time as pt with no nutrition-related questions or concerns.  If nutrition issues arise, please consult RD.   Gaynell Face, MS, RD, LDN Pager: [phone] Weekend/After Hours: [phone]

## 2017-12-13 NOTE — Progress Notes (Signed)
AM CBG is 62. Pt is NPO for procedure. D50 25 ml given per protocol. Endo aware and will recheck CBG. Eulas Post, RN

## 2017-12-14 ENCOUNTER — Encounter (HOSPITAL_COMMUNITY): Payer: Self-pay | Admitting: General Surgery

## 2017-12-14 LAB — GLUCOSE, CAPILLARY
GLUCOSE-CAPILLARY: 121 mg/dL — AB (ref 65–99)
Glucose-Capillary: 119 mg/dL — ABNORMAL HIGH (ref 65–99)
Glucose-Capillary: 93 mg/dL (ref 65–99)

## 2017-12-14 LAB — BASIC METABOLIC PANEL
ANION GAP: 11 (ref 5–15)
BUN: 23 mg/dL — ABNORMAL HIGH (ref 6–20)
CALCIUM: 8.1 mg/dL — AB (ref 8.9–10.3)
CO2: 23 mmol/L (ref 22–32)
Chloride: 108 mmol/L (ref 101–111)
Creatinine, Ser: 1.16 mg/dL (ref 0.61–1.24)
Glucose, Bld: 159 mg/dL — ABNORMAL HIGH (ref 65–99)
POTASSIUM: 5 mmol/L (ref 3.5–5.1)
Sodium: 142 mmol/L (ref 135–145)

## 2017-12-14 LAB — CBC
HCT: 49.3 % (ref 39.0–52.0)
Hemoglobin: 15.4 g/dL (ref 13.0–17.0)
MCH: 29.2 pg (ref 26.0–34.0)
MCHC: 31.2 g/dL (ref 30.0–36.0)
MCV: 93.5 fL (ref 78.0–100.0)
Platelets: 201 10*3/uL (ref 150–400)
RBC: 5.27 MIL/uL (ref 4.22–5.81)
RDW: 15.4 % (ref 11.5–15.5)
WBC: 9.8 10*3/uL (ref 4.0–10.5)

## 2017-12-14 MED ORDER — SODIUM CHLORIDE 0.9 % IV BOLUS: 500.0000 mL | Freq: Once | INTRAVENOUS | Status: DC

## 2017-12-14 NOTE — Progress Notes (Signed)
1 Day Post-Op   Subjective: No complaints this morning.  Up in chair.  Good pain control.  Denies nausea.  Nurses report a lot of gas in his colostomy bag.  Objective: Vital signs in last 24 hours: Temp:  [96.5 F (35.8 C)-98.4 F (36.9 C)] 97.6 F (36.4 C) (05/25 0735) Pulse Rate:  [56-116] 94 (05/25 0000) Resp:  [11-29] 19 (05/25 0824) BP: (83-167)/(35-103) 89/55 (05/25 0824) SpO2:  [85 %-100 %] 100 % (05/25 0824) Last BM Date: 12/14/17  Intake/Output from previous day: 05/24 0701 - 05/25 0700 In: 3612.3 [I.V.:2512.3; IV Piggyback:1100] Out: 1250 [Urine:475; Stool:675; Blood:100] Intake/Output this shift: Total I/O In: 195 [P.O.:120; I.V.:75] Out: 75 [Urine:75]  General appearance: alert, cooperative and no distress GI: Moderately distended and tympanitic but soft and nontender.  Healthy stoma left lower quadrant. Incision/Wound: Old blood under honeycomb dressing.  Lab Results:  Recent Labs    12/13/17 0421 12/14/17 0330  WBC 5.3 9.8  HGB 15.5 15.4  HCT 49.0 49.3  PLT 235 201   BMET Recent Labs    12/13/17 0421 12/14/17 0330  NA 140 142  K 4.0 5.0  CL 104 108  CO2 26 23  GLUCOSE 79 159*  BUN 27* 23*  CREATININE 1.17 1.16  CALCIUM 8.4* 8.1*     Studies/Results: No results found.  Anti-infectives: Anti-infectives (From admission, onward)   Start     Dose/Rate Route Frequency Ordered Stop   12/13/17 2200  metroNIDAZOLE (FLAGYL) IVPB 500 mg     500 mg 100 mL/hr over 60 Minutes Intravenous Every 8 hours 12/13/17 1859 12/17/17 2159   12/13/17 1930  cefTRIAXone (ROCEPHIN) injection 1 g  Status:  Discontinued     1 g Intramuscular Every 24 hours 12/13/17 1859 12/13/17 1910   12/13/17 1930  cefTRIAXone (ROCEPHIN) injection 2 g  Status:  Discontinued     2 g Intramuscular Every 24 hours 12/13/17 1910 12/13/17 1912   12/13/17 1930  cefTRIAXone (ROCEPHIN) 2 g in sodium chloride 0.9 % 100 mL IVPB     2 g 200 mL/hr over 30 Minutes Intravenous Every 24  hours 12/13/17 1912 12/17/17 1929   12/13/17 1400  cefoTEtan (CEFOTAN) 2 g in sodium chloride 0.9 % 100 mL IVPB  Status:  Discontinued    Note to Pharmacy:  Pharmacy may adjust dose strength for optimal dosing.   Send with patient on call to the OR.  Anest[facility] to complete antibiotic [facility]97[facility]   2 g 200 mL/hr over 30 Minutes Intravenous To Surgery 12/13/17 1105 12/13/17 1859   12/12/17 2200  metroNIDAZOLE (FLAGYL) IVPB 500 mg  Status:  Discontinued     500 mg 100 mL/hr over 60 Minutes Intravenous Every 8 hours 12/12/17 2109 12/13/17 1859      Assessment/Plan: s/p Procedure(s): LAPAROSCOPIC END COLOSTOMY HARTMAN'S PROCEDURE Stable postoperatively.  No evidence of significant bleeding.  Unable to place an NG tube at the time of surgery.  Leave n.p.o. until there is bowel function.   LOS: 1 day    BEdward Jolly5/25/2019Patient ID: Manuel [name] male   DOB: [birth year] 72y.o.   MRN: [id number]

## 2017-12-14 NOTE — Progress Notes (Signed)
[facility] Progress Note  Patient: Manuel [name] DPO:[id number]   PCP: Jani Gravel, MD DOB: 03/07/1946   DOA: 12/12/2017   DOS: 12/14/2017   Date of Service: the patient was seen and examined on 12/14/2017  Subjective: Feels better.  No abdominal pain.  No nausea no vomiting.  Brief hospital course: Atri Fib, OSA, DM, HTN, who presented to the Ed at [facility] with complaints of constipation that started a ~week ago, Thursday, but then he was able to have a small bowel movement ~5 days ago, none since. Patient underwent flexible sigmoidoscopy which found that patient had a diverticular stricture.  CT scan already showed patient had pneumatosis and surgery to the patient to OR for Hartman's procedure with colostomy and colectomy. Currently further plan is monitor postoperative recovery the stepdown unit.  Assessment and Plan: 1.  Abdominal pain with severe constipation. Pneumatosis of the colon. Diverticular stricture. S/P flexible sigmoidoscopy. S/P Hartmann procedure with colectomy and colostomy creation. Appreciate assistance from GI as well as general surgery. Continue with gentle IV hydration for now. Follow-up on recommendation of surgery for postoperative care. Continue antibiotics for now. Continue lactic IV fluid. N.p.o. mostly.  2.  A. fib with RVR. Hypotension. Intraoperatively patient developed some RVR as well as hypotension. With concern for worsening overnight patient will be transferred to stepdown unit for close monitoring. Continue IV hydration. Patient is on oral Cardizem at home 120 mg daily. We will start him on low-dose Cardizem infusion while the patient remains n.p.o. at present. Infusion will be continuous without any titration. Anticoagulation Eliquis is currently on hold. If remains stable can be transferred to telemetry tomorrow.  3.  BPH. Monitor for retention for now. Doxazosin was on hold.  4.  Type 2 diabetes mellitus. Controlled for now. While  the patient is n.p.o. we will continue with sliding scale every 4 hours. Hold metformin.  Diet: N.p.o. DVT Prophylaxis: subcutaneous Heparin  Advance goals of care discussion: full code  Family Communication: family was present at bedside, at the time of interview. The pt provided permission to discuss medical plan with the family. Opportunity was given to ask question and all questions were answered satisfactorily.   Disposition:  Discharge to home.  Consultants: gastroenterology General surgery  Procedures: flex sig colectomy and colostomy   Antibiotics: Anti-infectives (From admission, onward)   Start     Dose/Rate Route Frequency Ordered Stop   12/13/17 2200  metroNIDAZOLE (FLAGYL) IVPB 500 mg     500 mg 100 mL/hr over 60 Minutes Intravenous Every 8 hours 12/13/17 1859 12/17/17 2159   12/13/17 1930  cefTRIAXone (ROCEPHIN) injection 1 g  Status:  Discontinued     1 g Intramuscular Every 24 hours 12/13/17 1859 12/13/17 1910   12/13/17 1930  cefTRIAXone (ROCEPHIN) injection 2 g  Status:  Discontinued     2 g Intramuscular Every 24 hours 12/13/17 1910 12/13/17 1912   12/13/17 1930  cefTRIAXone (ROCEPHIN) 2 g in sodium chloride 0.9 % 100 mL IVPB     2 g 200 mL/hr over 30 Minutes Intravenous Every 24 hours 12/13/17 1912 12/17/17 1929   12/13/17 1400  cefoTEtan (CEFOTAN) 2 g in sodium chloride 0.9 % 100 mL IVPB  Status:  Discontinued    Note to Pharmacy:  Pharmacy may adjust dose strength for optimal dosing.   Send with patient on call to the OR.  Anesthesia to complete antibiotic administration <65mn [facility]   2 g 200 mL/hr over 30 Minutes  Intravenous To Surgery 12/13/17 1105 12/13/17 1859   12/12/17 2200  metroNIDAZOLE (FLAGYL) IVPB 500 mg  Status:  Discontinued     500 mg 100 mL/hr over 60 Minutes Intravenous Every 8 hours 12/12/17 2109 12/13/17 1859       Objective: Physical Exam: Vitals:   12/14/17 1437 12/14/17 1500 12/14/17 1530 12/14/17  1600  BP: (!) 93/59 (!) 88/51  (!) 95/58  Pulse:      Resp: _0 Temp:   97.7 F (36.5 C)   TempSrc:   Oral   SpO2: 100% 98%  100%  Weight:      Height:        Intake/Output Summary (Last 24 hours) at 12/14/2017 1706 Last data filed at 12/14/2017 1600 Gross per 24 hour  Intake 2917.25 ml  Output 1545 ml  Net 1372.25 ml   Filed Weights   12/12/17 1054 12/13/17 0850  Weight: 100.7 kg (222 lb) 100.7 kg (222 lb)   General: Alert, Awake and Oriented to Time, Place and Person. Appear in mild distress, affect appropriate Eyes: PERRL, Conjunctiva normal ENT: Oral Mucosa clear moist. Neck: no JVD, no Abnormal Mass Or lumps Cardiovascular: S1 and S2 Present, no Murmur, Peripheral Pulses Present Respiratory: normal respiratory effort, Bilateral Air entry equal and Decreased, no use of accessory muscle, Clear to Auscultation, no Crackles, no wheezes Abdomen: Bowel Sound absent, Soft and mild tenderness, no hernia Skin: no redness, no Rash, no induration Extremities: no Pedal edema, no calf tenderness Neurologic: Grossly no focal neuro deficit. Bilaterally Equal motor strength  Data Reviewed: CBC: Recent Labs  Lab 12/12/17 1335 12/13/17 0421 12/14/17 0330  WBC 5.2 5.3 9.8  NEUTROABS 3.9  --   --   HGB 15.3 15.5 15.4  HCT 45.4 49.0 49.3  MCV 88.0 91.2 93.5  PLT 209 235 016   Basic Metabolic Panel: Recent Labs  Lab 12/12/17 1335 12/13/17 0421 12/14/17 0330  NA 136 140 142  K 3.7 4.0 5.0  CL 104 104 108  CO2 _1 GLUCOSE 85 79 159*  BUN 27* 27* 23*  CREATININE 1.23 1.17 1.16  CALCIUM 8.3* 8.4* 8.1*    Liver Function Tests: Recent Labs  Lab 12/12/17 1335  AST 17  ALT 15*  ALKPHOS 70  BILITOT 1.4*  PROT 6.4*  ALBUMIN 3.3*   Recent Labs  Lab 12/12/17 1335  LIPASE 21   No results for input(s): AMMONIA in the last 168 hours. Coagulation Profile: Recent Labs  Lab 12/12/17 1335  INR 1.35   Cardiac Enzymes: No results for input(s): CKTOTAL,  CKMB, CKMBINDEX, TROPONINI in the last 168 hours. BNP (last 3 results) No results for input(s): PROBNP in the last 8760 hours. CBG: Recent Labs  Lab 12/13/17 1319 12/13/17 1849 12/13/17 2118 12/14/17 1122 12/14/17 1535  GLUCAP 95 114* 133* 121* 119*   Studies: No results found.  Scheduled Meds: . enoxaparin (LOVENOX) injection  40 mg Subcutaneous Q24H  . insulin aspart  0-9 Units Subcutaneous TID WC  . pravastatin  20 mg Oral Daily   Continuous Infusions: . cefTRIAXone (ROCEPHIN)  IV Stopped (12/13/17 0109)  . diltiazem (CARDIZEM) infusion Stopped (12/14/17 0520)  . famotidine (PEPCID) IV Stopped (12/13/17 2204)  . lactated ringers 75 mL/hr at 12/14/17 0600  . methocarbamol (ROBAXIN)  IV    . metronidazole Stopped (12/14/17 1536)  . sodium chloride     PRN Meds: methocarbamol (ROBAXIN)  IV, morphine injection, ondansetron **OR** ondansetron (ZOFRAN) IV  Time  spent: 35 minutes  Author: Berle Mull, MD [facility] Pager: [phone] 12/14/2017 5:06 PM  If 7PM-7AM, please contact night-coverage at [email or web address], password [facility]

## 2017-12-15 LAB — GLUCOSE, CAPILLARY
Glucose-Capillary: 156 mg/dL — ABNORMAL HIGH (ref 65–99)
Glucose-Capillary: 102 mg/dL — ABNORMAL HIGH (ref 65–99)
Glucose-Capillary: 102 mg/dL — ABNORMAL HIGH (ref 65–99)
Glucose-Capillary: 140 mg/dL — ABNORMAL HIGH (ref 65–99)
Glucose-Capillary: 136 mg/dL — ABNORMAL HIGH (ref 65–99)

## 2017-12-15 LAB — BASIC METABOLIC PANEL
Anion gap: 9 (ref 5–15)
BUN: 22 mg/dL — AB (ref 6–20)
CALCIUM: 8.2 mg/dL — AB (ref 8.9–10.3)
CHLORIDE: 105 mmol/L (ref 101–111)
CO2: 27 mmol/L (ref 22–32)
CREATININE: 1.11 mg/dL (ref 0.61–1.24)
GFR calc non Af Amer: >60 mL/min (ref >60)
GLUCOSE: 98 mg/dL (ref 65–99)
POTASSIUM: 4.6 mmol/L (ref 3.5–5.1)
SODIUM: 141 mmol/L (ref 135–145)

## 2017-12-15 LAB — CBC
HCT: 45.8 % (ref 39.0–52.0)
Hemoglobin: 13.9 g/dL (ref 13.0–17.0)
MCH: 28.8 pg (ref 26.0–34.0)
MCHC: 30.3 g/dL (ref 30.0–36.0)
MCV: 95 fL (ref 78.0–100.0)
PLATELETS: 225 10*3/uL (ref 150–400)
RBC: 4.82 MIL/uL (ref 4.22–5.81)
RDW: 15.6 % — AB (ref 11.5–15.5)
WBC: 9.1 10*3/uL (ref 4.0–10.5)

## 2017-12-15 LAB — MAGNESIUM: MAGNESIUM: 2 mg/dL (ref 1.7–2.4)

## 2017-12-15 MED ORDER — DILTIAZEM HCL-DEXTROSE 100-5 MG/100ML-% IV SOLN (PREMIX)
5.0000 mg/h | INTRAVENOUS | Status: DC
  Administered 2017-12-15: 5 mg/h via INTRAVENOUS

## 2017-12-15 MED ORDER — DOXAZOSIN MESYLATE 8 MG PO TABS
8.0000 mg | ORAL_TABLET | Freq: Every day | ORAL | Status: DC
  Administered 2017-12-15 – 2017-12-23 (×9): 8 mg via ORAL
  Filled 2017-12-15: qty 8
  Filled 2017-12-15: qty 1
  Filled 2017-12-15: qty 8
  Filled 2017-12-15 (×2): qty 2
  Filled 2017-12-15: qty 4
  Filled 2017-12-15: qty 1
  Filled 2017-12-15 (×2): qty 8
  Filled 2017-12-15: qty 1
  Filled 2017-12-15: qty 2
  Filled 2017-12-15: qty 1

## 2017-12-15 MED ORDER — SODIUM CHLORIDE 0.9 % IV BOLUS
1000.0000 mL | Freq: Once | INTRAVENOUS | Status: AC
  Administered 2017-12-15: 1000 mL via INTRAVENOUS

## 2017-12-15 NOTE — Progress Notes (Signed)
Patient ID: Manuel [name], male   DOB: 1946-03-25, 72 y.o.   MRN: [id number] 2 Days Post-Op   Subjective: Somewhat weak and dizzy when getting up to the chair.  Denies abdominal pain or nausea.  Objective: Vital signs in last 24 hours: Temp:  [97.7 F (36.5 C)-98.4 F (36.9 C)] 98.4 F (36.9 C) (05/26 0317) Pulse Rate:  [113] 113 (05/26 0735) Resp:  [11-24] 23 (05/26 0735) BP: (88-139)/(49-86) 127/82 (05/26 0735) SpO2:  [93 %-100 %] 94 % (05/26 0735) Last BM Date: 12/14/17  Intake/Output from previous day: 05/25 0701 - 05/26 0700 In: 2255 [P.O.:180; I.V.:1725; IV Piggyback:350] Out: 1370 [Urine:995; Stool:375] Intake/Output this shift: Total I/O In: 248.8 [P.O.:120; I.V.:128.8] Out: -   General appearance: alert, cooperative and no distress Resp: No wheezing or increased work of breathing GI: Soft, minimal distention, nontender Incision/Wound: Old bloody drainage under dressing.  No erythema  Lab Results:  Recent Labs    12/14/17 0330 12/15/17 0324  WBC 9.8 9.1  HGB 15.4 13.9  HCT 49.3 45.8  PLT 201 225   BMET Recent Labs    12/14/17 0330 12/15/17 0324  NA 142 141  K 5.0 4.6  CL 108 105  CO2 23 27  GLUCOSE 159* 98  BUN 23* 22*  CREATININE 1.16 1.11  CALCIUM 8.1* 8.2*     Studies/Results: No results found.  Anti-infectives: Anti-infectives (From admission, onward)   Start     Dose/Rate Route Frequency Ordered Stop   12/13/17 2200  metroNIDAZOLE (FLAGYL) IVPB 500 mg     500 mg 100 mL/hr over 60 Minutes Intravenous Every 8 hours 12/13/17 1859 12/17/17 2159   12/13/17 1930  cefTRIAXone (ROCEPHIN) injection 1 g  Status:  Discontinued     1 g Intramuscular Every 24 hours 12/13/17 1859 12/13/17 1910   12/13/17 1930  cefTRIAXone (ROCEPHIN) injection 2 g  Status:  Discontinued     2 g Intramuscular Every 24 hours 12/13/17 1910 12/13/17 1912   12/13/17 1930  cefTRIAXone (ROCEPHIN) 2 g in sodium chloride 0.9 % 100 mL IVPB     2 g 200 mL/hr over 30  Minutes Intravenous Every 24 hours 12/13/17 1912 12/17/17 1929   12/13/17 1400  cefoTEtan (CEFOTAN) 2 g in sodium chloride 0.9 % 100 mL IVPB  Status:  Discontinued    Note to Pharmacy:  Pharmacy may adjust dose strength for optimal dosing.   Send with patient on call to the OR.  Anesthesia to complete antibiotic administration <75mn [facility]   2 g 200 mL/hr over 30 Minutes Intravenous To Surgery 12/13/17 1105 12/13/17 1859   12/12/17 2200  metroNIDAZOLE (FLAGYL) IVPB 500 mg  Status:  Discontinued     500 mg 100 mL/hr over 60 Minutes Intravenous Every 8 hours 12/12/17 2109 12/13/17 1859      Assessment/Plan: s/p Procedure(s): LAPAROSCOPIC END COLOSTOMY HARTMAN'S PROCEDURE Doing well postoperatively.  Has gas and stool per colostomy.  No nausea.  Will start clear liquid diet. Should not need prolonged antibiotics. Should be okay to resume anticoagulation tomorrow   LOS: 2 days    BEdward Jolly5/26/2019

## 2017-12-15 NOTE — Progress Notes (Signed)
[facility] Progress Note  Patient: Manuel [name] MEQ:[id number]   PCP: Jani Gravel, MD DOB: 19-Sep-1945   DOA: 12/12/2017   DOS: 12/15/2017   Date of Service: the patient was seen and examined on 12/15/2017  Subjective: Complains about dizziness.  Heart rate increased this morning.  Felt better after IV bolus yesterday.  No nausea no vomiting no chest pain or shortness of breath.  Brief hospital course: Atri Fib, OSA, DM, HTN, who presented to the Ed at [facility] with complaints of constipation that started a ~week ago, Thursday, but then he was able to have a small bowel movement ~5 days ago, none since. Patient underwent flexible sigmoidoscopy which found that patient had a diverticular stricture.  CT scan already showed patient had pneumatosis and surgery to the patient to OR for Hartman's procedure with colostomy and colectomy. Currently further plan is monitor in stepdown unit.  Assessment and Plan: 1.  Abdominal pain with severe constipation. Pneumatosis of the colon. Diverticular stricture. S/P flexible sigmoidoscopy. S/P Hartmann procedure with colectomy and colostomy creation. Appreciate assistance from GI as well as general surgery. Follow-up on recommendation of surgery for postoperative care. Continue antibiotics for now.  Surgery recommends no need for prolonged antibiotics.  Will finish 5-day treatment course.  Last day of antibiotic 12/17/2017 Continue LR Started on clear liquid diet on 12/15/2017.  2.  A. fib with RVR. Hypotension. Intraoperatively patient developed some RVR as well as hypotension. Continue IV hydration. Patient is on oral Cardizem at home 120 mg daily. She was started on low-dose Cardizem continuous infusion.  Now that the patient has A. fib with RVR, Cardizem infusion is converted into titratable. Anticoagulation Eliquis is currently on hold. Give IV fluid bolus.  3.  BPH. Monitor for retention for now. Patient had a Foley catheter until today.   Which will be removed today.  Doxazosin is still on hold.  Resume today.  4.  Type 2 diabetes mellitus. Controlled for now. Continue every 4 sliding scale Hold metformin.  5.  Acute hypoxic respiratory failure. Requiring 3 L of oxygen for now. We will continue incentive spirometry. If no improvement will get chest x-ray tomorrow.  Currently clear to auscultation.  Diet: Clear liquid diet DVT Prophylaxis: subcutaneous Heparin  Advance goals of care discussion: full code  Family Communication: no family was present at bedside, at the time of interview.   Disposition:  Discharge to home.  Consultants: gastroenterology General surgery  Procedures: flex sig colectomy and colostomy   Antibiotics: Anti-infectives (From admission, onward)   Start     Dose/Rate Route Frequency Ordered Stop   12/13/17 2200  metroNIDAZOLE (FLAGYL) IVPB 500 mg     500 mg 100 mL/hr over 60 Minutes Intravenous Every 8 hours 12/13/17 1859 12/17/17 2159   12/13/17 1930  cefTRIAXone (ROCEPHIN) injection 1 g  Status:  Discontinued     1 g Intramuscular Every 24 hours 12/13/17 1859 12/13/17 1910   12/13/17 1930  cefTRIAXone (ROCEPHIN) injection 2 g  Status:  Discontinued     2 g Intramuscular Every 24 hours 12/13/17 1910 12/13/17 1912   12/13/17 1930  cefTRIAXone (ROCEPHIN) 2 g in sodium chloride 0.9 % 100 mL IVPB     2 g 200 mL/hr over 30 Minutes Intravenous Every 24 hours 12/13/17 1912 12/17/17 1929   12/13/17 1400  cefoTEtan (CEFOTAN) 2 g in sodium chloride 0.9 % 100 mL IVPB  Status:  Discontinued    Note to Pharmacy:  Pharmacy may adjust dose strength for  optimal dosing.   Send with patient on call to the OR.  Anesthesia to complete antibiotic administration <43mn [facility]   2 g 200 mL/hr over 30 Minutes Intravenous To Surgery 12/13/17 1105 12/13/17 1859   12/12/17 2200  metroNIDAZOLE (FLAGYL) IVPB 500 mg  Status:  Discontinued     500 mg 100 mL/hr over 60 Minutes  Intravenous Every 8 hours 12/12/17 2109 12/13/17 1859       Objective: Physical Exam: Vitals:   12/15/17 0300 12/15/17 0317 12/15/17 0735 12/15/17 0800  BP: 139/86  127/82 (!) 141/79  Pulse:   (!) 113   Resp: 16  (!) 23 19  Temp:  98.4 F (36.9 C)  (!) 100.8 F (38.2 C)  TempSrc:  Oral  Oral  SpO2: 93%  94% 95%  Weight:      Height:        Intake/Output Summary (Last 24 hours) at 12/15/2017 [id number] data filed at 12/15/2017 0800 Gross per 24 hour  Intake 2258.34 ml  Output 1570 ml  Net 688.34 ml   Filed Weights   12/12/17 1054 12/13/17 0850  Weight: 100.7 kg (222 lb) 100.7 kg (222 lb)   General: Alert, Awake and Oriented to Time, Place and Person. Appear in mild distress, affect appropriate Eyes: PERRL, Conjunctiva normal ENT: Oral Mucosa clear moist. Neck: no JVD, no Abnormal Mass Or lumps Cardiovascular: S1 and S2 Present, no Murmur, Peripheral Pulses Present Respiratory: normal respiratory effort, Bilateral Air entry equal and Decreased, no use of accessory muscle, Clear to Auscultation, no Crackles, no wheezes Abdomen: Bowel Sound present, Soft and mild tenderness, no hernia Skin: no redness, no Rash, no induration Extremities: no Pedal edema, no calf tenderness Neurologic: Grossly no focal neuro deficit. Bilaterally Equal motor strength  Data Reviewed: CBC: Recent Labs  Lab 12/12/17 1335 12/13/17 0421 12/14/17 0330 12/15/17 0324  WBC 5.2 5.3 9.8 9.1  NEUTROABS 3.9  --   --   --   HGB 15.3 15.5 15.4 13.9  HCT 45.4 49.0 49.3 45.8  MCV 88.0 91.2 93.5 95.0  PLT 209 235 201 2016  Basic Metabolic Panel: Recent Labs  Lab 12/12/17 1335 12/13/17 0421 12/14/17 0330 12/15/17 0324  NA 136 140 142 141  K 3.7 4.0 5.0 4.6  CL 104 104 108 105  CO2 _0 GLUCOSE 85 79 159* 98  BUN 27* 27* 23* 22*  CREATININE 1.23 1.17 1.16 1.11  CALCIUM 8.3* 8.4* 8.1* 8.2*  MG  --   --   --  2.0    Liver Function Tests: Recent Labs  Lab 12/12/17 1335  AST 17    ALT 15*  ALKPHOS 70  BILITOT 1.4*  PROT 6.4*  ALBUMIN 3.3*   Recent Labs  Lab 12/12/17 1335  LIPASE 21   No results for input(s): AMMONIA in the last 168 hours. Coagulation Profile: Recent Labs  Lab 12/12/17 1335  INR 1.35   Cardiac Enzymes: No results for input(s): CKTOTAL, CKMB, CKMBINDEX, TROPONINI in the last 168 hours. BNP (last 3 results) No results for input(s): PROBNP in the last 8760 hours. CBG: Recent Labs  Lab 12/13/17 1849 12/13/17 2118 12/14/17 1122 12/14/17 1535 12/14/17 2124  GLUCAP 114* 133* 121* 119* 93   Studies: No results found.  Scheduled Meds: . enoxaparin (LOVENOX) injection  40 mg Subcutaneous Q24H  . insulin aspart  0-9 Units Subcutaneous TID WC  . pravastatin  20 mg Oral Daily   Continuous  Infusions: . cefTRIAXone (ROCEPHIN)  IV Stopped (12/14/17 2049)  . diltiazem (CARDIZEM) infusion 7.5 mg/hr (12/15/17 0754)  . famotidine (PEPCID) IV Stopped (12/14/17 2056)  . lactated ringers 75 mL/hr at 12/15/17 0600  . methocarbamol (ROBAXIN)  IV    . metronidazole Stopped (12/15/17 5208)  . sodium chloride    . sodium chloride     PRN Meds: methocarbamol (ROBAXIN)  IV, morphine injection, ondansetron **OR** ondansetron (ZOFRAN) IV  Time spent: 35 minutes  Author: Berle Mull, MD [facility] Pager: [phone] 12/15/2017 9:09 AM  If 7PM-7AM, please contact night-coverage at [email or web address], password [facility]

## 2017-12-15 NOTE — Consult Note (Signed)
[place] Nurse ostomy consult note Stoma type/location:  LUQ colostomy performed by Dr. Marcello Moores on Friday, 5/24. Stomal assessment/size: 2 and 1/2 inches round, red,. Moist, deep red and edematous Peristomal assessment: intact, clear Treatment options for stomal/peristomal skin: skin barrier ring Output: no stool, flatus Ostomy pouching: 2pc.2 and 3/4 inch pouching sytem with skin barrier ring  Education provided: None today. Patient remains in the ICU and is sleepy at the time of my visit. Family friend, not family member, is visiting and steps out when I assess the ostomy. Enrolled patient in [place] program: No   WOC nursing team will follow along with you for support and education, and will remain available to this patient, the nursing, surgical and medical teams. Thanks, Maudie Flakes, MSN, RN, [place], Arther Abbott  Pager# [phone]

## 2017-12-16 LAB — BASIC METABOLIC PANEL
Anion gap: 9 (ref 5–15)
BUN: 17 mg/dL (ref 6–20)
CALCIUM: 8 mg/dL — AB (ref 8.9–10.3)
CO2: 27 mmol/L (ref 22–32)
CREATININE: 0.97 mg/dL (ref 0.61–1.24)
Chloride: 104 mmol/L (ref 101–111)
Glucose, Bld: 104 mg/dL — ABNORMAL HIGH (ref 65–99)
Potassium: 4.6 mmol/L (ref 3.5–5.1)
SODIUM: 140 mmol/L (ref 135–145)

## 2017-12-16 LAB — GLUCOSE, CAPILLARY
GLUCOSE-CAPILLARY: 97 mg/dL (ref 65–99)
GLUCOSE-CAPILLARY: 125 mg/dL — AB (ref 65–99)
Glucose-Capillary: 89 mg/dL (ref 65–99)
Glucose-Capillary: 160 mg/dL — ABNORMAL HIGH (ref 65–99)

## 2017-12-16 LAB — CBC
HCT: 41.9 % (ref 39.0–52.0)
Hemoglobin: 13 g/dL (ref 13.0–17.0)
MCH: 28.8 pg (ref 26.0–34.0)
MCHC: 31 g/dL (ref 30.0–36.0)
MCV: 92.9 fL (ref 78.0–100.0)
PLATELETS: 229 10*3/uL (ref 150–400)
RBC: 4.51 MIL/uL (ref 4.22–5.81)
RDW: 15.7 % — AB (ref 11.5–15.5)
WBC: 4.9 10*3/uL (ref 4.0–10.5)

## 2017-12-16 MED ORDER — DILTIAZEM HCL 30 MG PO TABS
45.0000 mg | ORAL_TABLET | Freq: Four times a day (QID) | ORAL | Status: DC
  Administered 2017-12-16 – 2017-12-17 (×4): 45 mg via ORAL
  Filled 2017-12-16 (×4): qty 2

## 2017-12-16 MED ORDER — FAMOTIDINE 20 MG PO TABS
20.0000 mg | ORAL_TABLET | Freq: Every day | ORAL | Status: DC
  Administered 2017-12-16 – 2017-12-18 (×3): 20 mg via ORAL
  Filled 2017-12-16 (×3): qty 1

## 2017-12-16 NOTE — Progress Notes (Signed)
[facility] Progress Note  Patient: Manuel [name] YOV:[id number]   PCP: Jani Gravel, MD DOB: June 23, 1946   DOA: 12/12/2017   DOS: 12/16/2017   Date of Service: the patient was seen and examined on 12/16/2017  Subjective: Dizziness better, no nausea no vomiting no chest pain.  No fever no chills.  Passing gas.  Tolerating clear liquids.  Brief hospital course: Atri Fib, OSA, DM, HTN, who presented to the Ed at [facility] with complaints of constipation that started a ~week ago, Thursday, but then he was able to have a small bowel movement ~5 days ago, none since. Patient underwent flexible sigmoidoscopy which found that patient had a diverticular stricture.  CT scan already showed patient had pneumatosis and surgery to the patient to OR for Hartman's procedure with colostomy and colectomy. Currently further plan is monitor in stepdown unit.  Assessment and Plan: 1.  Abdominal pain with severe constipation. Pneumatosis of the colon. Diverticular stricture. S/P flexible sigmoidoscopy. S/P Hartmann procedure with colectomy and colostomy creation. Appreciate assistance from GI as well as general surgery. Follow-up on recommendation of surgery for postoperative care. Discontinue antibiotics for now.  Surgery recommends no need for prolonged antibiotics. Last day of antibiotic 12/16/2017 Continue LR, Started on clear liquid diet on 12/15/2017.  2.  A. fib with RVR. Hypotension. Intraoperatively patient developed some RVR as well as hypotension. Continue IV hydration. Patient is on oral Cardizem at home 120 mg daily. was started on low-dose Cardizem continuous infusion.  Later on, the patient has A. fib with RVR, Cardizem infusion is converted into titratable. Was stopped yesterday without any oral Cardizem conversion. Patient was started on 45 mg every 6 hours Cardizem and monitor. If the heart rate remains stable will transfer to floor. Anticoagulation Eliquis is currently on hold.  3.   BPH. Monitor for retention for now. Patient had a Foley catheter until 12/16/2017.  Remote on same day. Resume doxazosin  4.  Type 2 diabetes mellitus. Controlled for now. Continue ACHS sliding scale Hold metformin.  5.  Acute hypoxic respiratory failure. Requiring 3 L of oxygen for now. We will continue incentive spirometry. If no improvement will get chest x-ray tomorrow.  Currently clear to auscultation.  Diet: full liquid diet DVT Prophylaxis: subcutaneous Heparin  Advance goals of care discussion: full code  Family Communication: no family was present at bedside, at the time of interview.   Disposition:  Discharge to home.  Consultants: gastroenterology General surgery  Procedures: flex sig colectomy and colostomy   Antibiotics: Anti-infectives (From admission, onward)   Start     Dose/Rate Route Frequency Ordered Stop   12/13/17 2200  metroNIDAZOLE (FLAGYL) IVPB 500 mg  Status:  Discontinued     500 mg 100 mL/hr over 60 Minutes Intravenous Every 8 hours 12/13/17 1859 12/16/17 0741   12/13/17 1930  cefTRIAXone (ROCEPHIN) injection 1 g  Status:  Discontinued     1 g Intramuscular Every 24 hours 12/13/17 1859 12/13/17 1910   12/13/17 1930  cefTRIAXone (ROCEPHIN) injection 2 g  Status:  Discontinued     2 g Intramuscular Every 24 hours 12/13/17 1910 12/13/17 1912   12/13/17 1930  cefTRIAXone (ROCEPHIN) 2 g in sodium chloride 0.9 % 100 mL IVPB  Status:  Discontinued     2 g 200 mL/hr over 30 Minutes Intravenous Every 24 hours 12/13/17 1912 12/16/17 0741   12/13/17 1400  cefoTEtan (CEFOTAN) 2 g in sodium chloride 0.9 % 100 mL IVPB  Status:  Discontinued  Note to Pharmacy:  Pharmacy may adjust dose strength for optimal dosing.   Send with patient on call to the OR.  Anesthesia to complete antibiotic administration <25mn [facility]   2 g 200 mL/hr over 30 Minutes Intravenous To Surgery 12/13/17 1105 12/13/17 1859   12/12/17 2200  metroNIDAZOLE  (FLAGYL) IVPB 500 mg  Status:  Discontinued     500 mg 100 mL/hr over 60 Minutes Intravenous Every 8 hours 12/12/17 2109 12/13/17 1859       Objective: Physical Exam: Vitals:   12/16/17 0500 12/16/17 0600 12/16/17 0800 12/16/17 1200  BP: (!) 171/80 (!) 166/87    Pulse:      Resp: _0 Temp:   98.1 F (36.7 C) 97.9 F (36.6 C)  TempSrc:   Oral   SpO2: 100% 97%    Weight:      Height:        Intake/Output Summary (Last 24 hours) at 12/16/2017 1549 Last data filed at 12/16/2017 1335 Gross per 24 hour  Intake 1965 ml  Output 1275 ml  Net 690 ml   Filed Weights   12/12/17 1054 12/13/17 0850  Weight: 100.7 kg (222 lb) 100.7 kg (222 lb)   General: Alert, Awake and Oriented to Time, Place and Person. Appear in mild distress, affect appropriate Eyes: PERRL, Conjunctiva normal ENT: Oral Mucosa clear moist. Neck: no JVD, no Abnormal Mass Or lumps Cardiovascular: S1 and S2 Present, no Murmur, Peripheral Pulses Present Respiratory: normal respiratory effort, Bilateral Air entry equal and Decreased, no use of accessory muscle, Clear to Auscultation, no Crackles, no wheezes Abdomen: Bowel Sound present, Soft and mild tenderness, no hernia Skin: no redness, no Rash, no induration Extremities: no Pedal edema, no calf tenderness Neurologic: Grossly no focal neuro deficit. Bilaterally Equal motor strength  Data Reviewed: CBC: Recent Labs  Lab 12/12/17 1335 12/13/17 0421 12/14/17 0330 12/15/17 0324 12/16/17 0333  WBC 5.2 5.3 9.8 9.1 4.9  NEUTROABS 3.9  --   --   --   --   HGB 15.3 15.5 15.4 13.9 13.0  HCT 45.4 49.0 49.3 45.8 41.9  MCV 88.0 91.2 93.5 95.0 92.9  PLT 209 235 201 225 2220  Basic Metabolic Panel: Recent Labs  Lab 12/12/17 1335 12/13/17 0421 12/14/17 0330 12/15/17 0324 12/16/17 0333  NA 136 140 142 141 140  K 3.7 4.0 5.0 4.6 4.6  CL 104 104 108 105 104  CO2 _1 GLUCOSE 85 79 159* 98 104*  BUN 27* 27* 23* 22* 17  CREATININE 1.23 1.17  1.16 1.11 0.97  CALCIUM 8.3* 8.4* 8.1* 8.2* 8.0*  MG  --   --   --  2.0  --     Liver Function Tests: Recent Labs  Lab 12/12/17 1335  AST 17  ALT 15*  ALKPHOS 70  BILITOT 1.4*  PROT 6.4*  ALBUMIN 3.3*   Recent Labs  Lab 12/12/17 1335  LIPASE 21   No results for input(s): AMMONIA in the last 168 hours. Coagulation Profile: Recent Labs  Lab 12/12/17 1335  INR 1.35   Cardiac Enzymes: No results for input(s): CKTOTAL, CKMB, CKMBINDEX, TROPONINI in the last 168 hours. BNP (last 3 results) No results for input(s): PROBNP in the last 8760 hours. CBG: Recent Labs  Lab 12/15/17 1240 12/15/17 1649 12/15/17 2159 12/16/17 0732 12/16/17 1132  GLUCAP 102* 140* 136* 97 89   Studies: No results found.  Scheduled Meds: .  diltiazem  45 mg Oral Q6H  . doxazosin  8 mg Oral Daily  . enoxaparin (LOVENOX) injection  40 mg Subcutaneous Q24H  . famotidine  20 mg Oral QHS  . insulin aspart  0-9 Units Subcutaneous TID WC  . pravastatin  20 mg Oral Daily   Continuous Infusions: . lactated ringers 75 mL/hr at 12/16/17 0929  . methocarbamol (ROBAXIN)  IV     PRN Meds: methocarbamol (ROBAXIN)  IV, morphine injection, ondansetron **OR** ondansetron (ZOFRAN) IV  Time spent: 35 minutes  Author: Berle Mull, MD [facility] Pager: [phone] 12/16/2017 3:49 PM  If 7PM-7AM, please contact night-coverage at [email or web address], password [facility]

## 2017-12-16 NOTE — Progress Notes (Signed)
Patient ID: Manuel [name], male   DOB: 23-Jan-1946, 72 y.o.   MRN: [id number] 3 Days Post-Op   Subjective: Feels better each day.  Still a little weak getting in and out of bed.  Denies abdominal pain or nausea on a liquid diet.  Objective: Vital signs in last 24 hours: Temp:  [97.4 F (36.3 C)-100.8 F (38.2 C)] 98.4 F (36.9 C) (05/27 0400) Resp:  [15-21] 15 (05/27 0600) BP: (114-171)/(54-100) 166/87 (05/27 0600) SpO2:  [90 %-100 %] 97 % (05/27 0600) Last BM Date: 12/15/17  Intake/Output from previous day: 05/26 0701 - 05/27 0700 In: 4313.6 [P.O.:940; I.V.:1823.6; IV Piggyback:1550] Out: 1525 [Urine:1425; Stool:100] Intake/Output this shift: No intake/output data recorded.  General appearance: alert, cooperative and no distress GI: normal findings: soft, non-tender and Nondistended Incision/Wound: Old blood under dressing.  No erythema.  Lab Results:  Recent Labs    12/15/17 0324 12/16/17 0333  WBC 9.1 4.9  HGB 13.9 13.0  HCT 45.8 41.9  PLT 225 229   BMET Recent Labs    12/15/17 0324 12/16/17 0333  NA 141 140  K 4.6 4.6  CL 105 104  CO2 27 27  GLUCOSE 98 104*  BUN 22* 17  CREATININE 1.11 0.97  CALCIUM 8.2* 8.0*     Studies/Results: No results found.  Anti-infectives: Anti-infectives (From admission, onward)   Start     Dose/Rate Route Frequency Ordered Stop   12/13/17 2200  metroNIDAZOLE (FLAGYL) IVPB 500 mg     500 mg 100 mL/hr over 60 Minutes Intravenous Every 8 hours 12/13/17 1859 12/17/17 2159   12/13/17 1930  cefTRIAXone (ROCEPHIN) injection 1 g  Status:  Discontinued     1 g Intramuscular Every 24 hours 12/13/17 1859 12/13/17 1910   12/13/17 1930  cefTRIAXone (ROCEPHIN) injection 2 g  Status:  Discontinued     2 g Intramuscular Every 24 hours 12/13/17 1910 12/13/17 1912   12/13/17 1930  cefTRIAXone (ROCEPHIN) 2 g in sodium chloride 0.9 % 100 mL IVPB     2 g 200 mL/hr over 30 Minutes Intravenous Every 24 hours 12/13/17 1912 12/17/17 1929   12/13/17 1400  cefoTEtan (CEFOTAN) 2 g in sodium chloride 0.9 % 100 mL IVPB  Status:  Discontinued    Note to Pharmacy:  Pharmacy may adjust dose strength for optimal dosing.   Send with patient on call to the OR.  [facility]est[facility]   2 g 200 mL/hr over 30 Minutes Intravenous To Surgery 12/13/17 1105 12/13/17 1859   12/12/17 2200  metroNIDAZOLE (FLAGYL) IVPB 500 mg  Status:  Discontinued     500 mg 100 mL/hr over 60 Minutes Intravenous Every 8 hours 12/12/17 2109 12/13/17 1859      Assessment/Plan: s/p Procedure(s): LAPAROSCOPIC END COLOSTOMY HARTMAN'S PROCEDURE Doing well postoperatively without apparent complication. I do not believe he needs any further antibiotics. Advance diet.  Likely transfer to floor today.   LOS: 3 days    Manuel Jolly5/27/2019

## 2017-12-16 NOTE — Evaluation (Signed)
Physical Therapy Evaluation Patient Details Name: Manuel [name] MRN: [id number] DOB: Feb 15, 1946 Today's Date: 12/16/2017   History of Present Illness  This is a 72 year old male with multiple medical problems who presents to the hospital with complaints of worsening abdominal distension; s/p  LAPAROSCOPIC END COLOSTOMY HARTMAN'S PROCEDURE; PMH: afib, cardioversion, DM  Clinical Impression  Pt admitted with above diagnosis. Pt currently with functional limitations due to the deficits listed below (see PT Problem List). * Pt will benefit from skilled PT to increase their independence and safety with mobility to allow discharge to the venue listed below.   Pt doing well today, will continue to follow, do not feel pt will need f/u PT if continues to mobilize during acute stay;  VSS during mobility, HR max 130, sats 89-94% on RA, RR in 20s     Follow Up Recommendations No PT follow up    Equipment Recommendations  Other (comment)(TBD)    Recommendations for Other Services       Precautions / Restrictions Precautions Precautions: Fall      Mobility  Bed Mobility Overal bed mobility: Needs Assistance Bed Mobility: Rolling;Sidelying to Sit;Sit to Sidelying Rolling: Min assist Sidelying to sit: Min guard     Sit to sidelying: Supervision General bed mobility comments: cues to log roll to reduce abd pain; dizzy on initial sit, resolved in a few sec  Transfers Overall transfer level: Needs assistance   Transfers: Sit to/from Stand Sit to Stand: Min assist         General transfer comment: light assist to safely rise and stabilize  Ambulation/Gait Ambulation/Gait assistance: Min assist Ambulation Distance (Feet): 150 Feet Assistive device: Rolling walker (2 wheeled);1 person hand held assist;IV Pole Gait Pattern/deviations: Step-through pattern     General Gait Details: slight drifting, no overt LOB  Stairs            Wheelchair Mobility    Modified Rankin  (Stroke Patients Only)       Balance Overall balance assessment: Needs assistance   Sitting balance-Leahy Scale: Fair       Standing balance-Leahy Scale: Fair                               Pertinent Vitals/Pain Pain Assessment: Faces Faces Pain Scale: Hurts little more Pain Location: abd pain with mobility Pain Descriptors / Indicators: Grimacing Pain Intervention(s): Monitored during session    Home Living Family/patient expects to be discharged to:: Private residence Living Arrangements: Spouse/significant other Available Help at Discharge: Family;Available 24 hours/day Type of Home: House Home Access: Stairs to enter Entrance Stairs-Rails: Right Entrance Stairs-Number of Steps: 4 Home Layout: Two level;Able to live on main level with bedroom/bathroom Home Equipment: None      Prior Function Level of Independence: Independent         Comments: very active, 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     Hand Dominance        Extremity/Trunk Assessment   Upper Extremity Assessment Upper Extremity Assessment: Overall WFL for tasks assessed    Lower Extremity Assessment Lower Extremity Assessment: Overall WFL for tasks assessed       Communication   Communication: No difficulties  Cognition Arousal/Alertness: Awake/alert Behavior During Therapy: WFL for tasks assessed/performed Overall Cognitive Status: Within Functional Limits for tasks assessed  General Comments      Exercises     Assessment/Plan    PT Assessment Patient needs continued PT services  PT Problem List Decreased balance;Decreased mobility;Decreased activity tolerance       PT Treatment Interventions DME instruction;Gait training;Functional mobility training;Therapeutic activities;Therapeutic exercise;Patient/family education;Balance training    PT Goals (Current goals can be found in the Care Plan section)  Acute Rehab PT  Goals Patient Stated Goal: home soon PT Goal Formulation: With patient Time For Goal Achievement: 12/30/17 Potential to Achieve Goals: Good    Frequency Min 3X/week   Barriers to discharge        Co-evaluation               AM-PAC PT "6 Clicks" Daily Activity  Outcome Measure Difficulty turning over in bed (including adjusting bedclothes, sheets and blankets)?: A Little Difficulty moving from lying on back to sitting on the side of the bed? : Unable Difficulty sitting down on and standing up from a chair with arms (e.g., wheelchair, bedside commode, etc,.)?: Unable Help needed moving to and from a bed to chair (including a wheelchair)?: A Little Help needed walking in hospital room?: A Little Help needed climbing 3-5 steps with a railing? : A Little 6 Click Score: 14    End of Session Equipment Utilized During Treatment: Gait belt Activity Tolerance: Patient tolerated treatment well Patient left: in bed;with call bell/phone within reach;with family/visitor present;with bed alarm set   PT Visit Diagnosis: Difficulty in walking, not elsewhere classified (R26.2)    Time: 1224-8250 PT Time Calculation (min) (ACUTE ONLY): 28 min   Charges:   PT Evaluation $PT Eval Low Complexity: 1 Low PT Treatments $Gait Training: 8-22 mins   PT G CodesKenyon Ana, PT Pager: [phone] 12/16/2017   [facility] 12/16/2017, 12:46 PM

## 2017-12-16 NOTE — Progress Notes (Signed)
Pharmacy IV to PO conversion  The patient is receiving famotidine by the intravenous route.  Based on criteria approved by the Pharmacy and [place], the medication is being converted to the equivalent oral dose form.   No active GI bleeding or impaired absorption  Not s/p esophagectomy  Documented ability to take oral medications for > 24 hr  Plan to continue treatment for at least 1 day  If you have any questions about this conversion, please contact the Pharmacy Department (ext [phone]).  Thank you.  Reuel Boom, PharmD, BCPS [phone] 12/16/2017, 12:19 PM

## 2017-12-17 LAB — BASIC METABOLIC PANEL
Anion gap: 7 (ref 5–15)
BUN: 13 mg/dL (ref 6–20)
CHLORIDE: 103 mmol/L (ref 101–111)
CO2: 31 mmol/L (ref 22–32)
CREATININE: 0.93 mg/dL (ref 0.61–1.24)
Calcium: 8 mg/dL — ABNORMAL LOW (ref 8.9–10.3)
GFR calc non Af Amer: >60 mL/min (ref >60)
GLUCOSE: 127 mg/dL — AB (ref 65–99)
Potassium: 3.7 mmol/L (ref 3.5–5.1)
Sodium: 141 mmol/L (ref 135–145)

## 2017-12-17 LAB — GLUCOSE, CAPILLARY
GLUCOSE-CAPILLARY: 124 mg/dL — AB (ref 65–99)
Glucose-Capillary: 123 mg/dL — ABNORMAL HIGH (ref 65–99)
Glucose-Capillary: 129 mg/dL — ABNORMAL HIGH (ref 65–99)
Glucose-Capillary: 186 mg/dL — ABNORMAL HIGH (ref 65–99)

## 2017-12-17 MED ORDER — DILTIAZEM HCL ER COATED BEADS 180 MG PO CP24
180.0000 mg | ORAL_CAPSULE | Freq: Every day | ORAL | Status: DC
  Administered 2017-12-17: 180 mg via ORAL
  Filled 2017-12-17: qty 1

## 2017-12-17 MED ORDER — APIXABAN 5 MG PO TABS
5.0000 mg | ORAL_TABLET | Freq: Two times a day (BID) | ORAL | Status: DC
  Administered 2017-12-17 – 2017-12-18 (×4): 5 mg via ORAL
  Filled 2017-12-17 (×4): qty 1

## 2017-12-17 MED ORDER — DILTIAZEM HCL ER COATED BEADS 120 MG PO CP24
120.0000 mg | ORAL_CAPSULE | Freq: Every day | ORAL | Status: DC
  Administered 2017-12-18 – 2017-12-23 (×6): 120 mg via ORAL
  Filled 2017-12-17 (×6): qty 1

## 2017-12-17 MED ORDER — POTASSIUM CHLORIDE CRYS ER 20 MEQ PO TBCR
40.0000 meq | EXTENDED_RELEASE_TABLET | Freq: Once | ORAL | Status: AC
  Administered 2017-12-17: 40 meq via ORAL
  Filled 2017-12-17: qty 2

## 2017-12-17 NOTE — Consult Note (Signed)
[facility] Nurse ostomy follow up Stoma type/location: LUQ colostomy (12/13/17, Dr. Marcello Moores) Stomal assessment/size: 2 and 1/4 inches round, red, moist, edematous with os at center. Peristomal assessment: intact, clear Treatment options for stomal/peristomal skin: skin barrier ring Output: Brown liquid stool and large amounts of flatus Ostomy pouching: 2pc. 2 and 3/4 inch pouching system with skin barrier ring. Education provided: Extended session with patient and his wife; their son came for the latter half of the session.   Explained role of ostomy nurse and creation of stoma  Explained stoma characteristics (budded, flush, color, texture, care) Demonstrated pouch change (cutting new skin barrier, measuring stoma, cleaning peristomal skin and stoma, use of barrier ring) Education on emptying when 1/3 to 1/2 full and how to empty Demonstrated use of toilet paper wick to clean tail closure  Discussed bathing, diet, gas,  Discussed treatment of peristomal skin: ring placement Discussed change frequencies:  Every 3-4 days Answered patient/family questions.  Both wife patient are able to perform Lock and Roll closure. Plan to begin emptying tomorrow.   Enrolled patient in [place] Discharge program: Yes, today.   Skin barriers (2 and 3/4 inch), 4 pouches (opaque, gas filter), 4 CeraPlus "Standard" rings, Large belt, 4 closed end pouches.   [place] nursing team will follow, and will remain available to this patient, the nursing, surgical and medical teams.    Thank you. Maudie Flakes, MSN, RN, [place], Ar[facility]  Pager# [phone]

## 2017-12-17 NOTE — Progress Notes (Signed)
ANTICOAGULATION CONSULT NOTE - Initial Consult  Pharmacy Consult for apixaban Indication: atrial fibrillation  Allergies  Allergen Reactions  . Sulfa Antibiotics Other (See Comments)    Headaches     Patient Measurements: Height: _0  (190.5 cm) Weight: 222 lb (100.7 kg) IBW/kg (Calculated) : 84.5  Vital Signs: Temp: 98.1 F (36.7 C) (05/28 1008) Temp Source: Oral (05/28 1008) BP: 148/110 (05/28 1008) Pulse Rate: 84 (05/28 1008)  Labs: Recent Labs    12/15/17 0324 12/16/17 0333 12/17/17 0326  HGB 13.9 13.0  --   HCT 45.8 41.9  --   PLT 225 229  --   CREATININE 1.11 0.97 0.93    Estimated Creatinine Clearance: 85.8 mL/min (by C-G formula based on SCr of 0.93 mg/dL).   Medical History: Past Medical History:  Diagnosis Date  . A-fib ([place])   . BPH (benign prostatic hyperplasia)   . Diabetes mellitus   . Dizziness and giddiness   . Dyslipidemia   . Dysrhythmia   . Hypertension   . Left carotid bruit   . OSA (obstructive sleep apnea)   . S/P colonoscopy     Assessment: Patient is day #4 post-op s/p laparoscopic end colostomy hartman's procedure. Patient was taking apixaban PTA for atrial fibrillation which has been on hold given surgery. Pharmacy consulted to resume apixaban dosing.   Plan:  Discontinued enoxaparin 40 mg subQ daily Resume apixaban 5 mg PO BID  Lenis Noon, PharmD 12/17/2017,11:34 AM

## 2017-12-17 NOTE — Progress Notes (Signed)
[facility] Progress Note  Patient: Manuel [name] [id number]   PCP: Jani Gravel, MD DOB: 01-May-1946   DOA: 12/12/2017   DOS: 12/17/2017   Date of Service: the patient was seen and examined on 12/17/2017  Subjective: No acute complaints no acute events.  2.2-second pause on telemetry.  Brief hospital course: Atri Fib, OSA, DM, HTN, who presented to the Ed at [facility] with complaints of constipation that started a ~week ago, Thursday, but then he was able to have a small bowel movement ~5 days ago, none since. Patient underwent flexible sigmoidoscopy which found that patient had a diverticular stricture.  CT scan already showed patient had pneumatosis and surgery to the patient to OR for Hartman's procedure with colostomy and colectomy. Currently further plan is monitor telemetry  Assessment and Plan: 1.  Abdominal pain with severe constipation. Pneumatosis of the colon. Diverticular stricture. S/P flexible sigmoidoscopy. S/P Hartmann procedure with colectomy and colostomy creation. Appreciate assistance from GI as well as general surgery. Follow-up on recommendation of surgery for postoperative care. Discontinue antibiotics for now.  Surgery recommends no need for prolonged antibiotics. Last day of antibiotic 12/16/2017 Continue LR, Started on regular diet.  2.  A. fib with RVR. Hypotension. Intraoperatively patient developed some RVR as well as hypotension. Continue IV hydration. Patient is on oral Cardizem at home 120 mg daily. was started on low-dose Cardizem continuous infusion.  Later on, the patient has A. fib with RVR, Cardizem infusion is converted into titratable. patient was started on 45 mg every 6 hours Cardizem Will transfer to telemetry and converted back to oral 120 mg Cardizem.  Patient received 1 dose of 180 mg Cardizem and had a 2.2-second pause on telemetry. Anticoagulation Eliquis is currently resumed as well.  3.  BPH. Monitor for retention for now. Patient  had a Foley catheter until 12/16/2017.  Remote on same day. Resume doxazosin  4.  Type 2 diabetes mellitus. Controlled for now. Continue ACHS sliding scale Hold metformin.  5.  Acute hypoxic respiratory failure. Resolved. Likely postoperative atelectasis. Continue PRN oxygen and incentive spirometry.  Diet: Regular diet DVT Prophylaxis: Eliquis  Advance goals of care discussion: full code  Family Communication: no family was present at bedside, at the time of interview.   Disposition:  Discharge to home.  Consultants: gastroenterology General surgery  Procedures: flex sig colectomy and colostomy   Antibiotics: Anti-infectives (From admission, onward)   Start     Dose/Rate Route Frequency Ordered Stop   12/13/17 2200  metroNIDAZOLE (FLAGYL) IVPB 500 mg  Status:  Discontinued     500 mg 100 mL/hr over 60 Minutes Intravenous Every 8 hours 12/13/17 1859 12/16/17 0741   12/13/17 1930  cefTRIAXone (ROCEPHIN) injection 1 g  Status:  Discontinued     1 g Intramuscular Every 24 hours 12/13/17 1859 12/13/17 1910   12/13/17 1930  cefTRIAXone (ROCEPHIN) injection 2 g  Status:  Discontinued     2 g Intramuscular Every 24 hours 12/13/17 1910 12/13/17 1912   12/13/17 1930  cefTRIAXone (ROCEPHIN) 2 g in sodium chloride 0.9 % 100 mL IVPB  Status:  Discontinued     2 g 200 mL/hr over 30 Minutes Intravenous Every 24 hours 12/13/17 1912 12/16/17 0741   12/13/17 1400  cefoTEtan (CEFOTAN) 2 g in sodium chloride 0.9 % 100 mL IVPB  Status:  Discontinued    Note to Pharmacy:  Pharmacy may adjust dose strength for optimal dosing.   Send with patient on call to the OR.  Anesthesia to complete antibiotic administration <60mn prior [facility]   2 g 200 mL/hr over 30 Minutes Intravenous To Surgery 12/13/17 1105 12/13/17 1859   12/12/17 2200  metroNIDAZOLE (FLAGYL) IVPB 500 mg  Status:  Discontinued     500 mg 100 mL/hr over 60 Minutes Intravenous Every 8 hours 12/12/17 2109  12/13/17 1859       Objective: Physical Exam: Vitals:   12/17/17 0800 12/17/17 1008 12/17/17 1100 12/17/17 1253  BP: (!) 144/76 (!) 148/110  (!) 137/93  Pulse:  84  75  Resp: _0 Temp:  98.1 F (36.7 C)  98.2 F (36.8 C)  TempSrc:  Oral  Oral  SpO2: 97% 93% 96% 91%  Weight:      Height:        Intake/Output Summary (Last 24 hours) at 12/17/2017 1722 Last data filed at 12/17/2017 1710 Gross per 24 hour  Intake 1290 ml  Output 1820 ml  Net -530 ml   Filed Weights   12/12/17 1054 12/13/17 0850  Weight: 100.7 kg (222 lb) 100.7 kg (222 lb)   General: Alert, Awake and Oriented to Time, Place and Person. Appear in mild distress, affect appropriate Eyes: PERRL, Conjunctiva normal ENT: Oral Mucosa clear moist. Neck: no JVD, no Abnormal Mass Or lumps Cardiovascular: S1 and S2 Present, no Murmur, Peripheral Pulses Present Respiratory: normal respiratory effort, Bilateral Air entry equal and Decreased, no use of accessory muscle, Clear to Auscultation, no Crackles, no wheezes Abdomen: Bowel Sound present, Soft and mild tenderness, no hernia Skin: no redness, no Rash, no induration Extremities: no Pedal edema, no calf tenderness Neurologic: Grossly no focal neuro deficit. Bilaterally Equal motor strength  Data Reviewed: CBC: Recent Labs  Lab 12/12/17 1335 12/13/17 0421 12/14/17 0330 12/15/17 0324 12/16/17 0333  WBC 5.2 5.3 9.8 9.1 4.9  NEUTROABS 3.9  --   --   --   --   HGB 15.3 15.5 15.4 13.9 13.0  HCT 45.4 49.0 49.3 45.8 41.9  MCV 88.0 91.2 93.5 95.0 92.9  PLT 209 235 201 225 2943  Basic Metabolic Panel: Recent Labs  Lab 12/13/17 0421 12/14/17 0330 12/15/17 0324 12/16/17 0333 12/17/17 0326  NA 140 142 141 140 141  K 4.0 5.0 4.6 4.6 3.7  CL 104 108 105 104 103  CO2 _1 GLUCOSE 79 159* 98 104* 127*  BUN 27* 23* 22* 17 13  CREATININE 1.17 1.16 1.11 0.97 0.93  CALCIUM 8.4* 8.1* 8.2* 8.0* 8.0*  MG  --   --  2.0  --   --     Liver Function  Tests: Recent Labs  Lab 12/12/17 1335  AST 17  ALT 15*  ALKPHOS 70  BILITOT 1.4*  PROT 6.4*  ALBUMIN 3.3*   Recent Labs  Lab 12/12/17 1335  LIPASE 21   No results for input(s): AMMONIA in the last 168 hours. Coagulation Profile: Recent Labs  Lab 12/12/17 1335  INR 1.35   Cardiac Enzymes: No results for input(s): CKTOTAL, CKMB, CKMBINDEX, TROPONINI in the last 168 hours. BNP (last 3 results) No results for input(s): PROBNP in the last 8760 hours. CBG: Recent Labs  Lab 12/16/17 1711 12/16/17 2110 12/17/17 0741 12/17/17 1149 12/17/17 1708  GLUCAP 125* 160* 123* 124* 129*   Studies: No results found.  Scheduled Meds: . apixaban  5 mg Oral BID  . [START ON 12/18/2017] diltiazem  120 mg Oral Daily  . doxazosin  8 mg Oral Daily  . famotidine  20 mg Oral QHS  . insulin aspart  0-9 Units Subcutaneous TID WC  . pravastatin  20 mg Oral Daily   Continuous Infusions: . methocarbamol (ROBAXIN)  IV     PRN Meds: methocarbamol (ROBAXIN)  IV, morphine injection, ondansetron **OR** ondansetron (ZOFRAN) IV  Time spent: 35 minutes  Author: Berle Mull, MD [facility] Pager: [phone] 12/17/2017 5:22 PM  If 7PM-7AM, please contact night-coverage at [email or web address], password [facility]

## 2017-12-17 NOTE — Progress Notes (Signed)
12/17/17  1340 Paged Dr. Posey Pronto to notify of patients 2.2 pause called from tele monitor.

## 2017-12-17 NOTE — Progress Notes (Signed)
[facility] Progress Note  4 Days Post-Op  Subjective: CC:  A little tearful because he is used to being the one helping people and he feels like he is now the one needing help. Otherwise c/o mild abdominal soreness that is improving. Tolerating PO. Having gas and stool in colostomy pouch. Nurses emptying pouch, he is not comfortable doing this yet. Had mobilized a short distance in the hallway.  Objective: Vital signs in last 24 hours: Temp:  [97.8 F (36.6 C)-99.2 F (37.3 C)] 98.8 F (37.1 C) (05/28 0714) Resp:  [15-17] 17 (05/28 0400) BP: (133-163)/(64-105) 134/64 (05/28 0411) SpO2:  [93 %-96 %] 96 % (05/28 0400) Last BM Date: 12/15/17  Intake/Output from previous day: 05/27 0701 - 05/28 0700 In: 1920 [P.O.:120; I.V.:1800] Out: 1445 [Urine:845; Stool:600] Intake/Output this shift: No intake/output data recorded.  PE: Gen:  Alert, NAD, pleasant and cooperative; up in chair Pulm:  Normal effort, clear to auscultation bilaterally Abd: Soft, appropriately tender, mild distention, LLQ colostomy pouch just changed w/ no gas/stool, midline incision - I removed honeycomb. Staples are clean and dry without surrounding erythema or active drainage.  Skin: warm and dry, no rashes  Psych: A&Ox3   Lab Results:  Recent Labs    12/15/17 0324 12/16/17 0333  WBC 9.1 4.9  HGB 13.9 13.0  HCT 45.8 41.9  PLT 225 229   BMET Recent Labs    12/16/17 0333 12/17/17 0326  NA 140 141  K 4.6 3.7  CL 104 103  CO2 27 31  GLUCOSE 104* 127*  BUN 17 13  CREATININE 0.97 0.93  CALCIUM 8.0* 8.0*   PT/INR No results for input(s): LABPROT, INR in the last 72 hours. CMP     Component Value Date/Time   NA 141 12/17/2017 0326   K 3.7 12/17/2017 0326   CL 103 12/17/2017 0326   CO2 31 12/17/2017 0326   GLUCOSE 127 (H) 12/17/2017 0326   BUN 13 12/17/2017 0326   CREATININE 0.93 12/17/2017 0326   CREATININE 1.23 04/10/2011 1307   CALCIUM 8.0 (L) 12/17/2017 0326   PROT 6.4 (L)  12/12/2017 1335   ALBUMIN 3.3 (L) 12/12/2017 1335   AST 17 12/12/2017 1335   ALT 15 (L) 12/12/2017 1335   ALKPHOS 70 12/12/2017 1335   BILITOT 1.4 (H) 12/12/2017 1335   GFRNONAA >60 12/17/2017 0326   GFRAA >60 12/17/2017 0326   Lipase     Component Value Date/Time   LIPASE 21 12/12/2017 1335       Studies/Results: No results found.  Anti-infectives: Anti-infectives (From admission, onward)   Start     Dose/Rate Route Frequency Ordered Stop   12/13/17 2200  metroNIDAZOLE (FLAGYL) IVPB 500 mg  Status:  Discontinued     500 mg 100 mL/hr over 60 Minutes Intravenous Every 8 hours 12/13/17 1859 12/16/17 0741   12/13/17 1930  cefTRIAXone (ROCEPHIN) injection 1 g  Status:  Discontinued     1 g Intramuscular Every 24 hours 12/13/17 1859 12/13/17 1910   12/13/17 1930  cefTRIAXone (ROCEPHIN) injection 2 g  Status:  Discontinued     2 g Intramuscular Every 24 hours 12/13/17 1910 12/13/17 1912   12/13/17 1930  cefTRIAXone (ROCEPHIN) 2 g in sodium chloride 0.9 % 100 mL IVPB  Status:  Discontinued     2 g 200 mL/hr over 30 Minutes Intravenous Every 24 hours 12/13/17 1912 12/16/17 0741   12/13/17 1400  cefoTEtan (CEFOTAN) 2 g in sodium chloride 0.9 % 100 mL IVPB  Status:  Discontinued    Note to Pharmacy:  Pharmacy may adjust dose strength for optimal dosing.   Send with patient on call to the OR.  Anesthesia to complete antibiotic administration <43mn [facility]   2 g 200 mL/hr over 30 Minutes Intravenous To Surgery 12/13/17 1105 12/13/17 1859   12/12/17 2200  metroNIDAZOLE (FLAGYL) IVPB 500 mg  Status:  Discontinued     500 mg 100 mL/hr over 60 Minutes Intravenous Every 8 hours 12/12/17 2109 12/13/17 1859       Assessment/Plan A.fib - Eliquis has been held  BPH DM2 HLD  OSA Constipation - abnormal CT Abd/Pelv 5/22 w/ pneumatosis of R colon and colonic distention that decreases in the area of the sigmoid colon where there is wall thickening.  - flex  sig 5/24 Dr. HBenson Norwaysignificant for benign appearing stricture   Sigmoid stricture s/p LAPAROSCOPIC END COLOSTOMY HARTMAN'S PROCEDURE 54/29/03Dr. ALeighton Ruff-  POD#4, afebrile, VSS -  path pending  -  having gas and stool in colostomy pouch; greatly appreciate WOC assistance with pt education -  Tolerating a regular diet  -  mobilize TID and use IS -  Stable for transfer to floor from surgical perspective  FEN: Reg diet,  ID: periop cefotetan VTE: SCD's, ok for chemical VTE from surgical perspective   Foley: none    LOS: 4 days    EJill Alexanders, [facility] 12/17/2017, 8:03 AM Pager: [phone]: [phone]-Fri 7:00 am-4:30 pm Sat-Sun 7:00 am-11:30 am

## 2017-12-18 DIAGNOSIS — I482 Chronic atrial fibrillation: Secondary | ICD-10-CM

## 2017-12-18 DIAGNOSIS — I10 Essential (primary) hypertension: Secondary | ICD-10-CM

## 2017-12-18 DIAGNOSIS — E118 Type 2 diabetes mellitus with unspecified complications: Secondary | ICD-10-CM

## 2017-12-18 DIAGNOSIS — K56699 Other intestinal obstruction unspecified as to partial versus complete obstruction: Principal | ICD-10-CM

## 2017-12-18 LAB — BASIC METABOLIC PANEL
ANION GAP: 8 (ref 5–15)
BUN: 19 mg/dL (ref 6–20)
CHLORIDE: 102 mmol/L (ref 101–111)
CO2: 33 mmol/L — AB (ref 22–32)
CREATININE: 0.87 mg/dL (ref 0.61–1.24)
Calcium: 8 mg/dL — ABNORMAL LOW (ref 8.9–10.3)
GFR calc non Af Amer: >60 mL/min (ref >60)
Glucose, Bld: 171 mg/dL — ABNORMAL HIGH (ref 65–99)
POTASSIUM: 4 mmol/L (ref 3.5–5.1)
SODIUM: 143 mmol/L (ref 135–145)

## 2017-12-18 LAB — CBC WITH DIFFERENTIAL/PLATELET
Basophils Absolute: 0 10*3/uL (ref 0.0–0.1)
Basophils Relative: 0 %
EOS PCT: 1 %
Eosinophils Absolute: 0 10*3/uL (ref 0.0–0.7)
HCT: 35.8 % — ABNORMAL LOW (ref 39.0–52.0)
Hemoglobin: 11.1 g/dL — ABNORMAL LOW (ref 13.0–17.0)
LYMPHS ABS: 1 10*3/uL (ref 0.7–4.0)
LYMPHS PCT: 25 %
MCH: 28.8 pg (ref 26.0–34.0)
MCHC: 31 g/dL (ref 30.0–36.0)
MCV: 92.7 fL (ref 78.0–100.0)
MONO ABS: 0.3 10*3/uL (ref 0.1–1.0)
Monocytes Relative: 7 %
Neutro Abs: 2.7 10*3/uL (ref 1.7–7.7)
Neutrophils Relative %: 67 %
PLATELETS: 192 10*3/uL (ref 150–400)
RBC: 3.86 MIL/uL — ABNORMAL LOW (ref 4.22–5.81)
RDW: 15.3 % (ref 11.5–15.5)
WBC: 4.1 10*3/uL (ref 4.0–10.5)

## 2017-12-18 LAB — GLUCOSE, CAPILLARY
Glucose-Capillary: 126 mg/dL — ABNORMAL HIGH (ref 65–99)
Glucose-Capillary: 134 mg/dL — ABNORMAL HIGH (ref 65–99)
Glucose-Capillary: 129 mg/dL — ABNORMAL HIGH (ref 65–99)
Glucose-Capillary: 133 mg/dL — ABNORMAL HIGH (ref 65–99)

## 2017-12-18 MED ORDER — METOPROLOL TARTRATE 50 MG PO TABS
50.0000 mg | ORAL_TABLET | Freq: Once | ORAL | Status: AC
  Administered 2017-12-18: 50 mg via ORAL
  Filled 2017-12-18: qty 1

## 2017-12-18 MED ORDER — METOPROLOL TARTRATE 25 MG PO TABS
25.0000 mg | ORAL_TABLET | Freq: Two times a day (BID) | ORAL | Status: DC
  Administered 2017-12-18 – 2017-12-23 (×10): 25 mg via ORAL
  Filled 2017-12-18 (×10): qty 1

## 2017-12-18 NOTE — Progress Notes (Addendum)
PROGRESS NOTE    Manuel [name]  YFV:[id number] DOB: 05/13/1946 DOA: 12/12/2017 PCP: Jani Gravel, MD   Brief Narrative:  Atri Fib, OSA, DM, HTN, who presented to the Ed at [facility] with complaints of constipation that started a ~week ago, Thursday, but then he was able to have a small bowel movement ~5 days ago, none since. Patient underwent flexible sigmoidoscopy which found that patient had a diverticular stricture.  CT scan already showed patient had pneumatosis and surgery to the patient to OR for Hartman's procedure with colostomy and colectomy. Currently further plan is monitor telemetry     Assessment & Plan:   Principal Problem:   Colonic stricture (HCC) Active Problems:   Atrial fibrillation (HCC)   BPH (benign prostatic hyperplasia)   DM (diabetes mellitus) (HCC)   HTN (hypertension)   OSA (obstructive sleep apnea)  #1 colonic stricture/abdominal pain with severe constipation Patient had presented with significant abdominal pain abdominal distention noted to be significantly constipated.  Patient underwent a flex sigmoidoscopy that showed a colonic stricture.  Patient seen in consultation by general surgery patient status post diagnostic laparoscopy, Hartman's procedure with end colostomy per Dr. Marcello Moores 12/13/2017.  Colostomy bag with dark stool.  Patient received IV antibiotics through 12/16/2017 and per general surgery no further antibiotics needed at this time.  Saline lock IV fluids.  Continue current diet.  Check a CBC and stool noted to be dark.  Home health RN ordered.  Per general surgery.  2.  A. fib with RVR/hypotension Intraoperatively patient developed A. fib with RVR with hypotension.  Blood pressure improved.  Heart rate in the 150s on ambulation with PT.  Heart rates at rest ranging from 90 to the 120s.  Patient noted initially to have a 2.2-second pause on telemetry after he received a dose of Cardizem 180 mg.  Continue current home dose Cardizem.  We will give a  dose of Lopressor 50 mg x 1 now and then Lopressor 25 mg twice daily.  Monitor closely with initiation of beta-blocker.  Continue anticoagulation with Eliquis.  Case discussed with patient's cardiologist Dr. Einar Gip.  Outpatient follow-up with cardiology.  3.  Hypertension Blood pressure stable.  Follow.  4.  BPH Foley catheter initially placed on 12/16/2017.  Foley catheter has been removed.  Continue doxazosin.  Good urine output.  5.  Diabetes mellitus type 2 CBGs ranging from 126 -186.  Continue to hold oral hypoglycemic agents.  Continue sliding scale insulin.  6.  Acute hypoxic respiratory failure Likely secondary to postop atelectasis.  Resolved.  Continue incentive spirometry.  Oxygen as needed.   DVT prophylaxis: Eliquis Code Status: Full Family Communication: Updated patient and family at bedside. Disposition Plan: Home when heart rate has improved and per general surgery hopefully tomorrow.   Consultants:   General surgery: Dr. Marlou Starks 12/12/2017  [place] RN Cristy Hilts, RN 12/15/2017  Gastroenterology: Dr. Benson Norway 12/13/2017  Procedures:   CT abdomen and pelvis 12/11/2017  Flexible sigmoidoscopy 12/13/2017--Per Dr. Benson Norway  Diagnostic laparoscopy, Hartman's procedure with end colostomy per Dr. Marcello Moores 12/13/2017  Antimicrobials:   IV Rocephin 12/13/2017>>>>> 12/16/2017     Subjective: Patient sitting up in chair.  Denies any chest pain or shortness of breath.  No abdominal pain.  No nausea or emesis.  Tolerating current diet.  States he ambulated in the hallway with physical therapy.  Patient noted to have heart rates as high as 150s on ambulation per PT note.  Patient also noted to have heart rates in 90s to  120s at rest.  Objective: Vitals:   12/17/17 1100 12/17/17 1253 12/17/17 2047 12/18/17 0417  BP:  (!) 137/93 (!) 152/97 135/81  Pulse:  75 71 93  Resp:  _0 Temp:  98.2 F (36.8 C) 98.5 F (36.9 C) 98.9 F (37.2 C)  TempSrc:  Oral Oral Oral  SpO2: 96% 91%  99% 91%  Weight:      Height:        Intake/Output Summary (Last 24 hours) at 12/18/2017 1209 Last data filed at 12/18/2017 1130 Gross per 24 hour  Intake 480 ml  Output 2825 ml  Net -2345 ml   Filed Weights   12/12/17 1054 12/13/17 0850  Weight: 100.7 kg (222 lb) 100.7 kg (222 lb)    Examination:  General exam: Appears calm and comfortable  Respiratory system: Clear to auscultation. Respiratory effort normal. Cardiovascular system: Irregularly irregular.  No JVD, murmurs rubs or gallops.  No pedal edema.  Gastrointestinal system: Abdomen is nondistended, soft and nontender.  Colostomy with dark stool noted.  Normal bowel sounds.  Central nervous system: Alert and oriented. No focal neurological deficits. Extremities: Symmetric 5 x 5 power. Skin: No rashes, lesions or ulcers Psychiatry: Judgement and insight appear normal. Mood & affect appropriate.     Data Reviewed: I have personally reviewed following labs and imaging studies  CBC: Recent Labs  Lab 12/12/17 1335 12/13/17 0421 12/14/17 0330 12/15/17 0324 12/16/17 0333  WBC 5.2 5.3 9.8 9.1 4.9  NEUTROABS 3.9  --   --   --   --   HGB 15.3 15.5 15.4 13.9 13.0  HCT 45.4 49.0 49.3 45.8 41.9  MCV 88.0 91.2 93.5 95.0 92.9  PLT 209 235 201 225 432   Basic Metabolic Panel: Recent Labs  Lab 12/14/17 0330 12/15/17 0324 12/16/17 0333 12/17/17 0326 12/18/17 0406  NA 142 141 140 141 143  K 5.0 4.6 4.6 3.7 4.0  CL 108 105 104 103 102  CO2 _1 33*  GLUCOSE 159* 98 104* 127* 171*  BUN 23* 22* _2 CREATININE 1.16 1.11 0.97 0.93 0.87  CALCIUM 8.1* 8.2* 8.0* 8.0* 8.0*  MG  --  2.0  --   --   --    GFR: Estimated Creatinine Clearance: 91.7 mL/min (by C-G formula based on SCr of 0.87 mg/dL). Liver Function Tests: Recent Labs  Lab 12/12/17 1335  AST 17  ALT 15*  ALKPHOS 70  BILITOT 1.4*  PROT 6.4*  ALBUMIN 3.3*   Recent Labs  Lab 12/12/17 1335  LIPASE 21   No results for input(s): AMMONIA in  the last 168 hours. Coagulation Profile: Recent Labs  Lab 12/12/17 1335  INR 1.35   Cardiac Enzymes: No results for input(s): CKTOTAL, CKMB, CKMBINDEX, TROPONINI in the last 168 hours. BNP (last 3 results) No results for input(s): PROBNP in the last 8760 hours. HbA1C: No results for input(s): HGBA1C in the last 72 hours. CBG: Recent Labs  Lab 12/17/17 1149 12/17/17 1708 12/17/17 2043 12/18/17 0729 12/18/17 1128  GLUCAP 124* 129* 186* 126* 134*   Lipid Profile: No results for input(s): CHOL, HDL, LDLCALC, TRIG, CHOLHDL, LDLDIRECT in the last 72 hours. Thyroid Function Tests: No results for input(s): TSH, T4TOTAL, FREET4, T3FREE, THYROIDAB in the last 72 hours. Anemia Panel: No results for input(s): VITAMINB12, FOLATE, FERRITIN, TIBC, IRON, RETICCTPCT in the last 72 hours. Sepsis Labs: Recent Labs  Lab 12/12/17 1343 12/12/17 1603 12/12/17 1835  LATICACIDVEN 0.84 1.49 1.36  Recent Results (from the past 240 hour(s))  MRSA PCR Screening     Status: None   Collection Time: 12/13/17 12:00 PM  Result Value Ref Range Status   MRSA by PCR NEGATIVE NEGATIVE Final    Comment:        The GeneXpert MRSA Assay (FDA approved for NASAL specimens only), is one component of a comprehensive MRSA colonization surveillance program. It is not intended to diagnose MRSA infection nor to guide or monitor treatment for MRSA infections. Performed at [facility], [place] [address], [place], [place] [address]          Radiology Studies: No results found.      Scheduled Meds: . apixaban  5 mg Oral BID  . diltiazem  120 mg Oral Daily  . doxazosin  8 mg Oral Daily  . famotidine  20 mg Oral QHS  . insulin aspart  0-9 Units Subcutaneous TID WC  . pravastatin  20 mg Oral Daily   Continuous Infusions: . methocarbamol (ROBAXIN)  IV       LOS: 5 days    Time spent: 40 minutes    Irine Seal, MD [facility] Pager [phone]  If  7PM-7AM, please contact night-coverage [email or web address] Password [facility] 12/18/2017, 12:09 PM

## 2017-12-18 NOTE — Plan of Care (Signed)
Pt still has a high level of anxiety D/T his colostomy bag.

## 2017-12-18 NOTE — Progress Notes (Signed)
CCMD reported a 2.04 sec pause w/ Afib. HR 77.

## 2017-12-18 NOTE — Discharge Instructions (Signed)
[facility]      [facility] °[phone] ° °OPEN ABDOMINAL SURGERY: POST OP INSTRUCTIONS ° °Always review your discharge instruction sheet given to you by the facility where your surgery was performed. ° °IF YOU HAVE DISABILITY OR FAMILY LEAVE FORMS, YOU MUST BRING THEM TO THE OFFICE FOR PROCESSING.  PLEASE DO NOT GIVE THEM TO YOUR DOCTOR. ° °1. A prescription for pain medication may be given to you upon discharge.  Take your pain medication as prescribed, if needed.  If narcotic pain medicine is not needed, then you may take acetaminophen (Tylenol) or ibuprofen (Advil) as needed. °2. Take your usually prescribed medications unless otherwise directed. °3. If you need a refill on your pain medication, please contact your pharmacy. They will contact our office to request authorization.  Prescriptions will not be filled after 5pm or on week-ends. °4. You should follow a light diet the first few days after arrival home, such as soup and crackers, pudding, etc.unless your doctor has advised otherwise. A high-fiber, low fat diet can be resumed as tolerated.   Be sure to include lots of fluids daily. Most patients will experience some swelling and bruising on the chest and neck area.  Ice packs will help.  Swelling and bruising can take several days to resolve °5. Most patients will experience some swelling and bruising in the area of the incision. Ice pack will help. Swelling and bruising can take several days to resolve..  °6. It is common to experience some constipation if taking pain medication after surgery.  Increasing fluid intake and taking a stool softener will usually help or prevent this problem from occurring.  A mild laxative (Milk of Magnesia or Miralax) should be taken according to package directions if there are no bowel movements after 48 hours. °7.  You may have steri-strips (small skin tapes) in place directly over the incision.  These strips should be left on the skin for 7-10 days.  If your  surgeon used skin glue on the incision, you may shower in 24 hours.  The glue will flake off over the next 2-3 weeks.  Any sutures or staples will be removed at the office during your follow-up visit. You may find that a light gauze bandage over your incision may keep your staples from being rubbed or pulled. You may shower and replace the bandage daily. °8. ACTIVITIES:  You may resume regular (light) daily activities beginning the next day--such as daily self-care, walking, climbing stairs--gradually increasing activities as tolerated.  You may have sexual intercourse when it is comfortable.  Refrain from any heavy lifting or straining until approved by your doctor. °a. You may drive when you no longer are taking prescription pain medication, you can comfortably wear a seatbelt, and you can safely maneuver your car and apply brakes °b. Return to Work: ___________________________________ °9. You should see your doctor in the office for a follow-up appointment approximately two weeks after your surgery.  Make sure that you call for this appointment within a day or two after you arrive home to insure a convenient appointment time. °OTHER INSTRUCTIONS:  °_____________________________________________________________ °_____________________________________________________________ ° °WHEN TO CALL YOUR DOCTOR: °1. Fever over 101.0 °2. Inability to urinate °3. Nausea and/or vomiting °4. Extreme swelling or bruising °5. Continued bleeding from incision. °6. Increased pain, redness, or drainage from the incision. °7. Difficulty swallowing or breathing °8. Muscle cramping or spasms. °9. Numbness or tingling in hands or feet or around lips. ° °The clinic staff is available to   answer your questions during regular business hours.  Please dont hesitate to call and ask to speak to one of the nurses if you have concerns.  For further questions, please visit [email or web address]

## 2017-12-18 NOTE — Progress Notes (Signed)
Colostomy output maroon/black. [facility] ([place]) notified.  Manuel [name]. Brigitte Pulse, RN

## 2017-12-18 NOTE — Progress Notes (Addendum)
Subjective: No new complaints. Working with [place] yesterday and plans to see her again today. Pain controlled, tolerating PO, having ostomy output.  Objective: Vitals:   12/17/17 2047 12/18/17 0417  BP: (!) 152/97 135/81  Pulse: 71 93  Resp: 16 20  Temp: 98.5 F (36.9 C) 98.9 F (37.2 C)  SpO2: 99% 91%   CBC Latest Ref Rng & Units 12/16/2017 12/15/2017 12/14/2017  WBC 4.0 - 10.5 K/uL 4.9 9.1 9.8  Hemoglobin 13.0 - 17.0 g/dL 13.0 13.9 15.4  Hematocrit 39.0 - 52.0 % 41.9 45.8 49.3  Platelets 150 - 400 K/uL 229 225 201   PE:  Gen - NAD, pleasant Abd - soft, nontender, staples clean and dry, colostomy with dark brown, soft stool in colostomy pouch, stoma viable, +BS    A&P A.fib - Eliquis resumed 5/28 BPH DM2 HLD  OSA Constipation - abnormal CT Abd/Pelv 5/22 w/ pneumatosis of R colon and colonic distention that decreases in the area of the sigmoid colon where there is wall thickening.  - flex sig 5/24 Dr. Benson Norway significant for benign appearing stricture   Sigmoid stricture s/p LAPAROSCOPIC END COLOSTOMY HARTMAN'S PROCEDURE 10/05/15 Dr. Leighton Ruff -  POD#5, afebrile, VSS -  path w/ acute and chronic inflammation/diveritulitis, no malignancy noted.  -  having gas and stool in colostomy pouch; greatly appreciate WOC assistance with pt education -  Tolerating a regular diet  -  mobilize TID and use IS -  Stable for discharge; follow up and D/C instructions provided. Will need HH RN for ostomy care.  FEN: Reg diet,  ID: periop cefotetan VTE: SCD's, Eliquis  Foley: none

## 2017-12-18 NOTE — Progress Notes (Signed)
Physical Therapy Treatment Patient Details Name: Manuel [name] MRN: [id number] DOB: Jan 08, 1946 Today's Date: 12/18/2017    History of Present Illness This is a 72 year old male with multiple medical problems who presents to the hospital with complaints of worsening abdominal distension; s/p  LAPAROSCOPIC END COLOSTOMY HARTMAN'S PROCEDURE; PMH: afib, cardioversion, DM    PT Comments     Pt progressing, overall in good spirits but tearful at times and states he has "cried a lot" during his stay here; focused on higher level balance this session; pt  activity tolerance improving, HR max 150 during balance tests-- however he did score a 44 on the BERG placing him in the significant fall risk category therefore will continue PT in acute setting  Follow Up Recommendations  No PT follow up     Equipment Recommendations  None recommended by PT    Recommendations for Other Services       Precautions / Restrictions Precautions Precautions: None Precaution Comments: monitor HR Restrictions Weight Bearing Restrictions: No    Mobility  Bed Mobility               General bed mobility comments: in chair  Transfers Overall transfer level: Modified independent               General transfer comment: incr time  Ambulation/Gait Ambulation/Gait assistance: Supervision;Min guard Ambulation Distance (Feet): 380 Feet Assistive device: None;1 person hand held assist Gait Pattern/deviations: Step-through pattern;Wide base of support     General Gait Details: intermittent HHA, light touch of rail x2, no LOB. slight drifting   Stairs             Wheelchair Mobility    Modified Rankin (Stroke Patients Only)       Balance     Sitting balance-Leahy Scale: Good       Standing balance-Leahy Scale: Good               High level balance activites: Backward walking;Direction changes;Turns;Head turns;Sudden stops High Level Balance Comments: mild hesitation,  guarded but no LOB, slightly delayed balance reactions with perturbations but not assist needed to recover  Standardized Balance Assessment Standardized Balance Assessment : Berg Balance Test Berg Balance Test Sit to Stand: Able to stand  independently using hands(uses hands --braces on  knees to stand) Standing Unsupported: Able to stand safely 2 minutes Sitting with Back Unsupported but Feet Supported on Floor or Stool: Able to sit safely and securely 2 minutes Stand to Sit: Sits safely with minimal use of hands Transfers: Able to transfer safely, minor use of hands Standing Unsupported with Eyes Closed: Able to stand 10 seconds with supervision Standing Ubsupported with Feet Together: Able to place feet together independently but unable to hold for 30 seconds From Standing, Reach Forward with Outstretched Arm: Can reach forward >12 cm safely (5") From Standing Position, Pick up Object from Floor: Able to pick up shoe safely and easily From Standing Position, Turn to Look Behind Over each Shoulder: Looks behind from both sides and weight shifts well Turn 360 Degrees: Able to turn 360 degrees safely in 4 seconds or less Standing Unsupported, Alternately Place Feet on Step/Stool: Able to stand independently and complete 8 steps >20 seconds Standing Unsupported, One Foot in Front: Needs help to step but can hold 15 seconds Standing on One Leg: Tries to lift leg/unable to hold 3 seconds but remains standing independently Total Score: 44        Cognition Arousal/Alertness: Awake/alert Behavior  During Therapy: WFL for tasks assessed/performed Overall Cognitive Status: Within Functional Limits for tasks assessed                                 General Comments: pt in overall good spirits but tearful at times      Exercises      General Comments        Pertinent Vitals/Pain Pain Assessment: No/denies pain    Home Living                      Prior Function             PT Goals (current goals can now be found in the care plan section) Acute Rehab PT Goals Patient Stated Goal: home soon PT Goal Formulation: With patient Time For Goal Achievement: 12/30/17 Potential to Achieve Goals: Good Progress towards PT goals: Progressing toward goals    Frequency    Min 3X/week      PT Plan Current plan remains appropriate    Co-evaluation              AM-PAC PT "6 Clicks" Daily Activity  Outcome Measure  Difficulty turning over in bed (including adjusting bedclothes, sheets and blankets)?: A Little Difficulty moving from lying on back to sitting on the side of the bed? : A Little Difficulty sitting down on and standing up from a chair with arms (e.g., wheelchair, bedside commode, etc,.)?: None Help needed moving to and from a bed to chair (including a wheelchair)?: None Help needed walking in hospital room?: None Help needed climbing 3-5 steps with a railing? : A Little 6 Click Score: 21    End of Session Equipment Utilized During Treatment: Gait belt Activity Tolerance: Patient tolerated treatment well Patient left: in chair;with call bell/phone within reach;with chair alarm set   PT Visit Diagnosis: Difficulty in walking, not elsewhere classified (R26.2)     Time: 9038-3338 PT Time Calculation (min) (ACUTE ONLY): 18 min  Charges:  $Gait Training: 8-22 mins                    G CodesKenyon Ana, PT Pager: [phone] 12/18/2017    Kenyon Ana 12/18/2017, 11:00 AM

## 2017-12-18 NOTE — Consult Note (Signed)
[place] Nurse ostomy follow up Stoma type/location: LUQ colostomy Stomal assessment/size: observed through pouch, edematous Peristomal assessment: not assessed Treatment options for stomal/peristomal skin: barrier ring in place Output black thick liquid effluent Ostomy pouching: 2pc. 2 3/4" pouch Education provided: Education on emptying when 1/3 to 1/2 full and how to empty, Demonstrated use of wick to clean spout. Patient able to empty pouch from start to finish without assistance or prompting. Patient very conscientious, asking appropriate questions. Patient is relieved that [facility] will come to his house.  Enrolled patient in [facility] Start Discharge program: Yesterday. Supplies placed in bag in cabinet above sink, a measuring guide, 5 barrier rings, 5 skin barriers and 5 pouches. Will inform bedside RN of black stool, looked like blood, hgb 13 yesterday, not checked today. [place] team will continue to follow.  Fara Olden, RN-C, WTA-C, [facility] Wound Treatment Associate Ostomy Care Associate

## 2017-12-18 NOTE — Progress Notes (Signed)
Pt had episode of Afib w/ RVR HR up as high as 156. NT was with pt. Emptying his colostomy bag. Pt was asked if he felt like he would be able to care for himself at home. He stated "yes."  Pt stated that just the fact that he has to have this makes him anxious. Pt HR came down to 97 but still in Afib.

## 2017-12-18 NOTE — Care Management Important Message (Signed)
Important Message  Patient Details  Name: Manuel [name] MRN: [id number] Date of Birth: [birth year]   Medicare Important Message Given:  Yes    Kerin Salen 12/18/2017, 12:18 [place] Message  Patient Details  Name: Manuel [name] MRN: [id number] Date of Birth: 09/28/1945   Medicare Important Message Given:  Yes    Kerin Salen 12/18/2017, 12:18 PM

## 2017-12-19 ENCOUNTER — Inpatient Hospital Stay (HOSPITAL_COMMUNITY): Payer: Medicare HMO | Admitting: Anesthesiology

## 2017-12-19 ENCOUNTER — Encounter (HOSPITAL_COMMUNITY): Payer: Self-pay

## 2017-12-19 ENCOUNTER — Inpatient Hospital Stay (HOSPITAL_COMMUNITY): Payer: Medicare HMO

## 2017-12-19 ENCOUNTER — Encounter (HOSPITAL_COMMUNITY)
Admission: EM | Disposition: A | Discharge status: Home Health | Payer: Self-pay | Source: Home / Self Care | Attending: Internal Medicine

## 2017-12-19 DIAGNOSIS — B3781 Candidal esophagitis: Secondary | ICD-10-CM

## 2017-12-19 DIAGNOSIS — K922 Gastrointestinal hemorrhage, unspecified: Secondary | ICD-10-CM

## 2017-12-19 DIAGNOSIS — D62 Acute posthemorrhagic anemia: Secondary | ICD-10-CM | POA: Diagnosis not present

## 2017-12-19 DIAGNOSIS — R7981 Abnormal blood-gas level: Secondary | ICD-10-CM

## 2017-12-19 DIAGNOSIS — R0902 Hypoxemia: Secondary | ICD-10-CM

## 2017-12-19 HISTORY — DX: Gastrointestinal hemorrhage, unspecified: K92.2

## 2017-12-19 HISTORY — PX: BIOPSY: SHX5522

## 2017-12-19 HISTORY — DX: Candidal esophagitis: B37.81

## 2017-12-19 HISTORY — PX: ESOPHAGOGASTRODUODENOSCOPY: SHX5428

## 2017-12-19 LAB — DIC (DISSEMINATED INTRAVASCULAR COAGULATION)PANEL
Fibrinogen: 338 mg/dL (ref 210–475)
Prothrombin Time: 15.7 seconds — ABNORMAL HIGH (ref 11.4–15.2)
Smear Review: NONE SEEN
aPTT: 30 seconds (ref 24–36)

## 2017-12-19 LAB — CBC
HCT: 25 % — ABNORMAL LOW (ref 39.0–52.0)
HCT: 20.9 % — ABNORMAL LOW (ref 39.0–52.0)
HEMOGLOBIN: 7.7 g/dL — AB (ref 13.0–17.0)
HEMOGLOBIN: 6.9 g/dL — AB (ref 13.0–17.0)
MCH: 28.1 pg (ref 26.0–34.0)
MCH: 29.7 pg (ref 26.0–34.0)
MCHC: 30.8 g/dL (ref 30.0–36.0)
MCHC: 33 g/dL (ref 30.0–36.0)
MCV: 91.2 fL (ref 78.0–100.0)
MCV: 90.1 fL (ref 78.0–100.0)
PLATELETS: 179 10*3/uL (ref 150–400)
Platelets: 195 10*3/uL (ref 150–400)
RBC: 2.74 MIL/uL — AB (ref 4.22–5.81)
RBC: 2.32 MIL/uL — AB (ref 4.22–5.81)
RDW: 15.1 % (ref 11.5–15.5)
RDW: 15.2 % (ref 11.5–15.5)
WBC: 5.8 10*3/uL (ref 4.0–10.5)
WBC: 6.6 10*3/uL (ref 4.0–10.5)

## 2017-12-19 LAB — BASIC METABOLIC PANEL
ANION GAP: 8 (ref 5–15)
BUN: 38 mg/dL — ABNORMAL HIGH (ref 6–20)
CHLORIDE: 107 mmol/L (ref 101–111)
CO2: 28 mmol/L (ref 22–32)
Calcium: 7.8 mg/dL — ABNORMAL LOW (ref 8.9–10.3)
Creatinine, Ser: 1.02 mg/dL (ref 0.61–1.24)
GFR calc non Af Amer: >60 mL/min (ref >60)
GLUCOSE: 157 mg/dL — AB (ref 65–99)
Potassium: 4 mmol/L (ref 3.5–5.1)
Sodium: 143 mmol/L (ref 135–145)

## 2017-12-19 LAB — IRON AND TIBC
IRON: 68 ug/dL (ref 45–182)
Saturation Ratios: 40 % — ABNORMAL HIGH (ref 17.9–39.5)
TIBC: 168 ug/dL — AB (ref 250–450)
UIBC: 100 ug/dL

## 2017-12-19 LAB — GLUCOSE, CAPILLARY
Glucose-Capillary: 146 mg/dL — ABNORMAL HIGH (ref 65–99)
Glucose-Capillary: 106 mg/dL — ABNORMAL HIGH (ref 65–99)

## 2017-12-19 LAB — DIC (DISSEMINATED INTRAVASCULAR COAGULATION) PANEL
D DIMER QUANT: 2.04 ug{FEU}/mL — AB (ref 0.00–0.50)
INR: 1.27
PLATELETS: 179 10*3/uL (ref 150–400)

## 2017-12-19 LAB — ABO/RH: ABO/RH(D): A POS

## 2017-12-19 LAB — PREPARE RBC (CROSSMATCH)

## 2017-12-19 LAB — OCCULT BLOOD X 1 CARD TO LAB, STOOL: Fecal Occult Bld: POSITIVE — AB

## 2017-12-19 LAB — FERRITIN: FERRITIN: 62 ng/mL (ref 24–336)

## 2017-12-19 LAB — VITAMIN B12: VITAMIN B 12: 127 pg/mL — AB (ref 180–914)

## 2017-12-19 LAB — HEMOGLOBIN AND HEMATOCRIT, BLOOD
HCT: 22.2 % — ABNORMAL LOW (ref 39.0–52.0)
HEMATOCRIT: 23.5 % — AB (ref 39.0–52.0)
HEMOGLOBIN: 7.7 g/dL — AB (ref 13.0–17.0)
Hemoglobin: 7 g/dL — ABNORMAL LOW (ref 13.0–17.0)

## 2017-12-19 LAB — FOLATE: Folate: 5.8 ng/mL — ABNORMAL LOW (ref >5.9)

## 2017-12-19 LAB — MAGNESIUM: Magnesium: 1.5 mg/dL — ABNORMAL LOW (ref 1.7–2.4)

## 2017-12-19 LAB — HEMOGLOBIN A1C
Hgb A1c MFr Bld: 6.4 % — ABNORMAL HIGH (ref 4.8–5.6)
Mean Plasma Glucose: 136.98 mg/dL

## 2017-12-19 SURGERY — EGD (ESOPHAGOGASTRODUODENOSCOPY)
Anesthesia: Monitor Anesthesia Care

## 2017-12-19 MED ORDER — LACTATED RINGERS IV SOLN
INTRAVENOUS | Status: DC
  Administered 2017-12-19 – 2017-12-21 (×5): via INTRAVENOUS

## 2017-12-19 MED ORDER — ONDANSETRON HCL 4 MG/2ML IJ SOLN
INTRAMUSCULAR | Status: DC | PRN
  Administered 2017-12-19: 4 mg via INTRAVENOUS

## 2017-12-19 MED ORDER — ONDANSETRON HCL 4 MG/2ML IJ SOLN
INTRAMUSCULAR | Status: AC
  Filled 2017-12-19: qty 2

## 2017-12-19 MED ORDER — SODIUM CHLORIDE 0.9 % IV SOLN
80.0000 mg | Freq: Once | INTRAVENOUS | Status: AC
  Administered 2017-12-19: 80 mg via INTRAVENOUS
  Filled 2017-12-19: qty 80

## 2017-12-19 MED ORDER — DIPHENHYDRAMINE HCL 25 MG PO CAPS
25.0000 mg | ORAL_CAPSULE | Freq: Once | ORAL | Status: AC
  Administered 2017-12-19: 25 mg via ORAL
  Filled 2017-12-19: qty 1

## 2017-12-19 MED ORDER — FLUCONAZOLE 100 MG PO TABS
100.0000 mg | ORAL_TABLET | Freq: Every day | ORAL | Status: DC
  Administered 2017-12-19 – 2017-12-23 (×5): 100 mg via ORAL
  Filled 2017-12-19 (×5): qty 1

## 2017-12-19 MED ORDER — PROPOFOL 10 MG/ML IV BOLUS
INTRAVENOUS | Status: DC | PRN
  Administered 2017-12-19: 50 mg via INTRAVENOUS

## 2017-12-19 MED ORDER — ACETAMINOPHEN 325 MG PO TABS
650.0000 mg | ORAL_TABLET | Freq: Once | ORAL | Status: AC
  Administered 2017-12-19: 650 mg via ORAL
  Filled 2017-12-19: qty 2

## 2017-12-19 MED ORDER — PHENYLEPHRINE 40 MCG/ML (10ML) SYRINGE FOR IV PUSH (FOR BLOOD PRESSURE SUPPORT)
PREFILLED_SYRINGE | INTRAVENOUS | Status: DC | PRN
  Administered 2017-12-19 (×2): 80 ug via INTRAVENOUS
  Administered 2017-12-19: 40 ug via INTRAVENOUS
  Administered 2017-12-19: 80 ug via INTRAVENOUS
  Administered 2017-12-19: 40 ug via INTRAVENOUS
  Administered 2017-12-19: 80 ug via INTRAVENOUS

## 2017-12-19 MED ORDER — PANTOPRAZOLE SODIUM 40 MG PO TBEC
40.0000 mg | DELAYED_RELEASE_TABLET | Freq: Every day | ORAL | Status: DC
  Administered 2017-12-19: 40 mg via ORAL
  Filled 2017-12-19: qty 1

## 2017-12-19 MED ORDER — SODIUM CHLORIDE 0.9 % IV SOLN
8.0000 mg/h | INTRAVENOUS | Status: AC
  Administered 2017-12-19 – 2017-12-21 (×7): 8 mg/h via INTRAVENOUS
  Filled 2017-12-19 (×11): qty 80

## 2017-12-19 MED ORDER — SODIUM CHLORIDE 0.9 % IV SOLN
Freq: Once | INTRAVENOUS | Status: AC
  Administered 2017-12-19: 16:00:00 via INTRAVENOUS

## 2017-12-19 MED ORDER — PANTOPRAZOLE SODIUM 40 MG IV SOLR: 40.0000 mg | Freq: Two times a day (BID) | INTRAVENOUS | Status: DC

## 2017-12-19 MED ORDER — SODIUM CHLORIDE 0.9 % IV SOLN
Freq: Once | INTRAVENOUS | Status: AC
  Administered 2017-12-19: 22:00:00 via INTRAVENOUS

## 2017-12-19 MED ORDER — FUROSEMIDE 10 MG/ML IJ SOLN
20.0000 mg | Freq: Once | INTRAMUSCULAR | Status: AC
  Administered 2017-12-19: 20 mg via INTRAVENOUS
  Filled 2017-12-19: qty 2

## 2017-12-19 MED ORDER — PROPOFOL 500 MG/50ML IV EMUL
INTRAVENOUS | Status: DC | PRN
  Administered 2017-12-19: 100 ug/kg/min via INTRAVENOUS

## 2017-12-19 MED ORDER — PROPOFOL 10 MG/ML IV BOLUS
INTRAVENOUS | Status: AC
  Filled 2017-12-19: qty 40

## 2017-12-19 MED ORDER — ALBUMIN HUMAN 5 % IV SOLN
INTRAVENOUS | Status: DC | PRN
  Administered 2017-12-19: 14:00:00 via INTRAVENOUS

## 2017-12-19 MED ORDER — FUROSEMIDE 10 MG/ML IJ SOLN
20.0000 mg | Freq: Once | INTRAMUSCULAR | Status: DC
  Filled 2017-12-19: qty 2

## 2017-12-19 MED ORDER — LIDOCAINE 2% (20 MG/ML) 5 ML SYRINGE
INTRAMUSCULAR | Status: DC | PRN
  Administered 2017-12-19: 60 mg via INTRAVENOUS

## 2017-12-19 MED ORDER — SODIUM CHLORIDE 0.9 % IV SOLN: INTRAVENOUS | Status: DC | PRN

## 2017-12-19 MED ORDER — MAGNESIUM SULFATE 4 GM/100ML IV SOLN
4.0000 g | Freq: Once | INTRAVENOUS | Status: AC
  Administered 2017-12-19: 4 g via INTRAVENOUS
  Filled 2017-12-19: qty 100

## 2017-12-19 NOTE — Progress Notes (Signed)
Subjective: Pt with bloody BM's and significant drop in Hgb.  Mild tachycardia and hypotension noted  Objective: Vitals:   12/19/17 0835 12/19/17 0840  BP: 98/74   Pulse: 96   Resp: 18   Temp: 97.7 F (36.5 C)   SpO2: (!) 79% 100%   CBC Latest Ref Rng & Units 12/19/2017 12/18/2017 12/16/2017  WBC 4.0 - 10.5 K/uL 5.8 4.1 4.9  Hemoglobin 13.0 - 17.0 g/dL 7.7(L) 11.1(L) 13.0  Hematocrit 39.0 - 52.0 % 25.0(L) 35.8(L) 41.9  Platelets 150 - 400 K/uL 195 192 229   PE:  Gen - NAD, alert, pleasant Abd - soft, nontender, staples clean and dry, colostomy with black stool in colostomy pouch, stoma viable  A&P A.fib - Eliquis resumed 5/28, stopped 5/30 due to GI bleed BPH DM2 HLD  OSA Constipation - abnormal CT Abd/Pelv 5/22 w/ pneumatosis of R colon and colonic distention that decreases in the area of the sigmoid colon where there is wall thickening.  - flex sig 5/24 Dr. Benson Norway significant for benign appearing stricture   Sigmoid stricture s/p LAPAROSCOPIC END COLOSTOMY HARTMAN'S PROCEDURE fro colonic obstruction.  5/80/99 Dr. Leighton Ruff -  POD#6, Now with GI bleed.  Pt placed back on IVF's and Eliquis held.  Would rec GI evaluation.  Pt should not have a colonoscopy unless needed emergently due to new ostomy creation.  Pt typed and crossed.  Transfusion decision per primary MD service.  -  path w/ acute and chronic inflammation/diveritulitis, no malignancy noted.  -  having gas and stool in colostomy pouch; greatly appreciate WOC assistance with pt education -  Tolerating a regular diet  -  mobilize TID and use IS   FEN: Reg diet,  ID: periop cefotetan VTE: SCD's, Eliquis held currently Foley: none   Rosario Adie, MD  [facility]

## 2017-12-19 NOTE — Op Note (Signed)
[facility] Patient Name: Manuel [name] Procedure Date: 12/19/2017 MRN: [id number] Attending MD: Carol Ada , MD Date of Birth: 1946-05-18 CSN: [id number] Age: 72 Admit Type: Inpatient Procedure:                Upper GI endoscopy Indications:              Acute post hemorrhagic anemia, Melena Providers:                Carol Ada, MD, Angus Seller, Courtney Heys.                            [place], CRNA Referring MD:              Medicines:                Propofol per Anesthesia Complications:            No immediate complications. Estimated Blood Loss:     Estimated blood loss was minimal. Procedure:                Pre-Anesthesia Assessment:                           - Prior to the procedure, a History and Physical                            was performed, and patient medications and                            allergies were reviewed. The patient's tolerance of                            previous anesthesia was also reviewed. The risks                            and benefits of the procedure and the sedation                            options and risks were discussed with the patient.                            All questions were answered, and informed consent                            was obtained. Prior Anticoagulants: The patient has                            taken no previous anticoagulant or antiplatelet                            agents. ASA Grade Assessment: II - A patient with                            mild systemic disease. After reviewing the risks  and benefits, the patient was deemed in                            satisfactory condition to undergo the procedure.                           - Sedation was administered by an anesthesia                            professional. Deep sedation was attained.                           After obtaining informed consent, the endoscope was                            passed under direct  vision. Throughout the                            procedure, the patient's blood pressure, pulse, and                            oxygen saturations were monitored continuously. The                            [id number] ([id number]) scope was introduced through the                            mouth, and advanced to the second part of duodenum.                            The upper GI endoscopy was accomplished without                            difficulty. The patient tolerated the procedure                            well. Scope In: Scope Out: Findings:      Patchy, white plaques were found in the entire esophagus.      Multiple non-bleeding cratered gastric ulcers with no stigmata of       bleeding were found in the gastric antrum. The largest lesion was 10 mm       in largest dimension. Biopsies were taken with a cold forceps for       Helicobacter pylori testing.      The examined duodenum was normal.      There was marked distortion of the antral lumen as a result of the       ulcerations and the edema. It was difficult to discern if the       ulcerations were only in the gastric antrum with involvement of the       duodenum or if it was only in the duodenum. The ulcerations were all       clean-based and there was no evidence of any bleeding. Impression:               - Esophageal plaques were found, suspicious for  candidiasis.                           - Non-bleeding gastric ulcers with no stigmata of                            bleeding.                           - Normal examined duodenum.                           - No specimens collected. Moderate Sedation:      N/A- Per Anesthesia Care Recommendation:           - Return patient to hospital ward for ongoing care.                           - NPO for now.                           - Continue present medications. IV pantoprazole.                           - Await pathology results.                            - Fluconazole 100 mg QD. Procedure Code(s):        --- Professional ---                           [phone], Esophagogastroduodenoscopy, flexible,                            transoral; with biopsy, single or multiple Diagnosis Code(s):        --- Professional ---                           K25.9, Gastric ulcer, unspecified as acute or                            chronic, without hemorrhage or perforation                           K22.9, Disease of esophagus, unspecified                           D62, Acute posthemorrhagic anemia                           K92.1, Melena (includes Hematochezia) CPT copyright 2017 American Medical Association. All rights reserved. The codes documented in this report are preliminary and upon coder review may  be revised to meet current compliance requirements. Carol Ada, MD Carol Ada, MD 12/19/2017 3:41:38 PM This report has been signed electronically. Number of Addenda: 0

## 2017-12-19 NOTE — Progress Notes (Signed)
Manuel [name] [id number] Admission Data: 12/19/2017 5:53 PM Attending Provider: Eugenie Filler, MD  PCP:Kim, Jeneen Rinks, MD Consults/ Treatment Team: Treatment Team:  [place], [place], MD Carol Ada, MD  Manuel [name] is a 72 y.o. male patient admitted from ED awake, alert  & orientated  X4,  Full Code, VSS - Blood pressure 94/60, pulse (!) 101, temperature 97.8 F (36.6 C), temperature source Oral, resp. rate (!) 21, height 6' 3" (1.905 m), weight 100.7 kg (222 lb), SpO2 96 %., O2   4 L nasal cannular, no c/o shortness of breath, no c/o chest pain, no distress noted. Tele placed and pt is currently running:atrial fibrillation, with ventricular rate of 92.   IV site WDL:  hand right, condition patent and no redness and left, condition patent and no redness and forearm left, condition patent and no redness with a transparent dsg that's clean dry and intact.  Allergies:   Allergies  Allergen Reactions  . Sulfa Antibiotics Other (See Comments)    Headaches      Past Medical History:  Diagnosis Date  . A-fib ([place])   . BPH (benign prostatic hyperplasia)   . Diabetes mellitus   . Dizziness and giddiness   . Dyslipidemia   . Dysrhythmia   . Hypertension   . Left carotid bruit   . OSA (obstructive sleep apnea)   . S/P colonoscopy     History:  obtained from chart review.  Pt orientation to unit, room and routine. Admission INP armband ID verified with patient/family, and in place. SR up x 2, fall risk assessment complete with Patient and family verbalizing understanding of risks associated with falls. Pt verbalizes an understanding of how to use the call bell and to call for help before getting out of bed.  Skin, clean-dry- intact without evidence of bruising, or skin tears.      Will cont to monitor and assist as needed.  Eliot Ford, RN 12/19/2017 5:53 PM

## 2017-12-19 NOTE — Interval H&P Note (Signed)
History and Physical Interval Note:  12/19/2017 1:17 PM  Manuel [name]  has presented today for surgery, with the diagnosis of Melena and anemia  The various methods of treatment have been discussed with the patient and family. After consideration of risks, benefits and other options for treatment, the patient has consented to  Procedure(s): ESOPHAGOGASTRODUODENOSCOPY (EGD) (N/A) as a surgical intervention .  The patient's history has been reviewed, patient examined, no change in status, stable for surgery.  I have reviewed the patient's chart and labs.  Questions were answered to the patient's satisfaction.     Manuel [name] D

## 2017-12-19 NOTE — H&P (View-Only) (Signed)
PROGRESS NOTE    Manuel [name]  MRN:[id number] DOB: 11/30/1945 DOA: 12/12/2017 PCP: Kim, Margarita, MD   Brief Narrative:  Atri Fib, OSA, DM, HTN, who presented to the Ed at [facility] with complaints of constipation that started a ~week ago, Thursday, but then he was able to have a small bowel movement ~5 days ago, none since. Patient underwent flexible sigmoidoscopy which found that patient had a diverticular stricture.  CT scan already showed patient had pneumatosis and surgery to the patient to OR for Hartman's procedure with colostomy and colectomy. Currently further plan is monitor telemetry. Patient noted to have an acute GI bleed as blood noted coming out of colostomy.  Hemoglobin dropped significantly from 11-7.0 over the past 24 hours.  Patient typed and cross and to be transfused 2 units packed red blood cells.  GI to reassess and patient for probable upper endoscopy 12/19/2017.     Assessment & Plan:   Principal Problem:   Colonic stricture (HCC) Active Problems:   Acute GI bleeding   Atrial fibrillation (HCC)   BPH (benign prostatic hyperplasia)   DM (diabetes mellitus) (HCC)   HTN (hypertension)   OSA (obstructive sleep apnea)   Acute posthemorrhagic anemia  #1 colonic stricture/abdominal pain with severe constipation Patient had presented with significant abdominal pain abdominal distention noted to be significantly constipated.  Patient underwent a flex sigmoidoscopy that showed a colonic stricture.  Patient seen in consultation by general surgery patient status post diagnostic laparoscopy, Hartman's procedure with end colostomy per Dr. Thomas 12/13/2017.  Colostomy bag with dark stool.  Patient received IV antibiotics through 12/16/2017 and per general surgery no further antibiotics needed at this time.  Patient now with probable acute upper GI bleed as patient noted to have melanotic stools in colostomy bag as well as bright red blood noted out of colostomy overnight.  General  surgery following.  Will need home health RN on discharge.   2.  A. fib with RVR/hypotension Intraoperatively patient developed A. fib with RVR with hypotension.  Blood pressure improved.  Heart rate in the 150s on ambulation with PT on 12/18/2017.  Heart rates at rest ranging from 90 to the 120s.  Patient noted initially to have a 2.2-second pause on telemetry after he received a dose of Cardizem 180 mg.  Patient noted to have heart rates in the 150s early this morning however patient noted to be somewhat hypotensive and also in the acute GI bleed.  Continue current regimen of Lopressor 25 mg twice daily as well as current dose of Cardizem.  Eliquis has been discontinued secondary to acute GI bleed.  Rate control was discussed with Dr. Ganji on 12/18/2017.  Will transfuse 2 units packed red blood cells and follow heart rate.  If rate becomes difficult to control will formally consult with cardiology.   3.  Acute GI bleed likely upper GI bleed/symptomatic anemia/acute blood loss anemia Patient noted to have melanotic stool out of colostomy bag.  Patient also noted to have bright red blood out of colostomy bag overnight per RN note.  Hemoglobin 7.7 this morning from 11.1 on 12/18/2017.  Repeat H&H with a hemoglobin of 7.  Patient complained of fatigue.  Patient noted to have heart rates in the 150s with systolic blood pressures in the 90s.  Patient likely with an acute upper GI bleed.  Eliquis has been discontinued.  We will type and cross and transfuse 2 units packed red blood cells.  Stat anemia panel.  Patient states did   not have any breakfast this morning and as such we will make n.p.o.  Case discussed with Dr. Hong, gastroenterology who will reassess patient and schedule patient for upper endoscopy today.  Placed on a Protonix drip.  Will defer resumption of anticoagulation to gastroenterology pending current upper GI bleed work-up.  4.  Hypertension Patient noted to be hypotensive early this morning.   Likely secondary to acute blood loss.  To be transfused 2 units packed red blood cells.  Monitor closely on Lopressor and Cardizem.   5.  BPH Foley catheter initially placed on 12/16/2017.  Foley catheter has been removed.  Continue doxazosin.  Good urine output.  6.  Diabetes mellitus type 2 CBGs ranging 146 this morning.  Continue to hold oral hypoglycemic agents.  Continue sliding scale insulin.    7.  Acute hypoxic respiratory failure Likely secondary to postop atelectasis.  Patient noted to be hypoxic this morning and placed on O2.  New O2 requirements likely secondary to symptomatic anemia/acute GI bleed.  Transfuse 2 units packed red blood cells.  Follow.    DVT prophylaxis: SCD Code Status: Full Family Communication: Updated patient and family at bedside. Disposition Plan: To be determined.  Home once acute GI bleed has resolved, hemoglobin stabilized and heart rate better controlled.   Consultants:   General surgery: Dr. Toth 12/12/2017  WOC RN Laurie McNicol, RN 12/15/2017  Gastroenterology: Dr. Hung 12/13/2017  Procedures:   CT abdomen and pelvis 12/11/2017  Flexible sigmoidoscopy 12/13/2017--Per Dr. Hung  Diagnostic laparoscopy, Hartman's procedure with end colostomy per Dr. Thomas 12/13/2017  2 units packed red blood cells pending 12/19/2017  Antimicrobials:   IV Rocephin 12/13/2017>>>>> 12/16/2017     Subjective: Patient laying in bed.  States he feels very tired this morning.  Denies any shortness of breath.  Denies any chest pain.  It is noted per RN notes overnight that patient had red liquid bowel movement and gross blood output via stoma.  Patient also noted to have heart rate in the 150s early this morning around 1:38 AM.    Objective: Vitals:   12/18/17 2046 12/19/17 0503 12/19/17 0835 12/19/17 0840  BP: 105/63 91/68 98/74   Pulse: 78 (!) 102 96   Resp: 16 20 18   Temp: 99.3 F (37.4 C)  97.7 F (36.5 C)   TempSrc: Oral  Oral   SpO2: 92% 98% (!) 79%  100%  Weight:      Height:        Intake/Output Summary (Last 24 hours) at 12/19/2017 1059 Last data filed at 12/19/2017 0851 Gross per 24 hour  Intake -  Output 2650 ml  Net -2650 ml   Filed Weights   12/12/17 1054 12/13/17 0850  Weight: 100.7 kg (222 lb) 100.7 kg (222 lb)    Examination:  General exam: NAD Respiratory system: Clear to auscultation anterior lung fields.  No wheezes, no crackles, no rhonchi. Respiratory effort normal. Cardiovascular system: Irregularly irregular.  No JVD, murmurs rubs or gallops.  No pedal edema.  Gastrointestinal system: Abdomen is soft, nontender, nondistended.  Colostomy with melanotic stool noted.  Positive bowel sounds. Central nervous system: Alert and oriented. No focal neurological deficits. Extremities: Symmetric 5 x 5 power. Skin: No rashes, lesions or ulcers Psychiatry: Judgement and insight appear normal. Mood & affect appropriate.     Data Reviewed: I have personally reviewed following labs and imaging studies  CBC: Recent Labs  Lab 12/12/17 1335  12/14/17 0330 12/15/17 0324 12/16/17 0333 12/18/17 1311 12/19/17   0414 12/19/17 0830  WBC 5.2   < > 9.8 9.1 4.9 4.1 5.8  --   NEUTROABS 3.9  --   --   --   --  2.7  --   --   HGB 15.3   < > 15.4 13.9 13.0 11.1* 7.7* 7.0*  HCT 45.4   < > 49.3 45.8 41.9 35.8* 25.0* 22.2*  MCV 88.0   < > 93.5 95.0 92.9 92.7 91.2  --   PLT 209   < > 201 225 229 192 195  --    < > = values in this interval not displayed.   Basic Metabolic Panel: Recent Labs  Lab 12/15/17 0324 12/16/17 0333 12/17/17 0326 12/18/17 0406 12/19/17 0414  NA 141 140 141 143 143  K 4.6 4.6 3.7 4.0 4.0  CL 105 104 103 102 107  CO2 27 27 31 33* 28  GLUCOSE 98 104* 127* 171* 157*  BUN 22* 17 13 19 38*  CREATININE 1.11 0.97 0.93 0.87 1.02  CALCIUM 8.2* 8.0* 8.0* 8.0* 7.8*  MG 2.0  --   --   --  1.5*   GFR: Estimated Creatinine Clearance: 78.2 mL/min (by C-G formula based on SCr of 1.02 mg/dL). Liver Function  Tests: Recent Labs  Lab 12/12/17 1335  AST 17  ALT 15*  ALKPHOS 70  BILITOT 1.4*  PROT 6.4*  ALBUMIN 3.3*   Recent Labs  Lab 12/12/17 1335  LIPASE 21   No results for input(s): AMMONIA in the last 168 hours. Coagulation Profile: Recent Labs  Lab 12/12/17 1335  INR 1.35   Cardiac Enzymes: No results for input(s): CKTOTAL, CKMB, CKMBINDEX, TROPONINI in the last 168 hours. BNP (last 3 results) No results for input(s): PROBNP in the last 8760 hours. HbA1C: Recent Labs    12/19/17 0434  HGBA1C 6.4*   CBG: Recent Labs  Lab 12/18/17 0729 12/18/17 1128 12/18/17 1649 12/18/17 2048 12/19/17 0814  GLUCAP 126* 134* 129* 133* 146*   Lipid Profile: No results for input(s): CHOL, HDL, LDLCALC, TRIG, CHOLHDL, LDLDIRECT in the last 72 hours. Thyroid Function Tests: No results for input(s): TSH, T4TOTAL, FREET4, T3FREE, THYROIDAB in the last 72 hours. Anemia Panel: Recent Labs    12/19/17 0830  VITAMINB12 127*  FOLATE 5.8*  FERRITIN 62  TIBC 168*  IRON 68   Sepsis Labs: Recent Labs  Lab 12/12/17 1343 12/12/17 1603 12/12/17 1835  LATICACIDVEN 0.84 1.49 1.36    Recent Results (from the past 240 hour(s))  MRSA PCR Screening     Status: None   Collection Time: 12/13/17 12:00 PM  Result Value Ref Range Status   MRSA by PCR NEGATIVE NEGATIVE Final    Comment:        The GeneXpert MRSA Assay (FDA approved for NASAL specimens only), is one component of a comprehensive MRSA colonization surveillance program. It is not intended to diagnose MRSA infection nor to guide or monitor treatment for MRSA infections. Performed at [facility], [address] [place], [place] [address]          Radiology Studies: Dg Chest Port 1 View  Result Date: 12/19/2017 CLINICAL DATA:  Decreased O2 sats EXAM: PORTABLE CHEST 1 VIEW COMPARISON:  03/28/2016 FINDINGS: Mild cardiomegaly. Linear bibasilar atelectasis or scarring, similar to prior study, favor  scarring. No effusions or acute bony abnormality. IMPRESSION: Chronic bibasilar densities, likely scarring.  Mild cardiomegaly. Electronically Signed   By: Kevin  Dover M.D.   On: 12/19/2017 09:35          Scheduled Meds: . diltiazem  120 mg Oral Daily  . doxazosin  8 mg Oral Daily  . famotidine  20 mg Oral QHS  . insulin aspart  0-9 Units Subcutaneous TID WC  . metoprolol tartrate  25 mg Oral BID  . pravastatin  20 mg Oral Daily   Continuous Infusions: . sodium chloride    . lactated ringers 75 mL/hr at 12/19/17 0524  . magnesium sulfate 1 - 4 g bolus IVPB 4 g (12/19/17 0920)  . methocarbamol (ROBAXIN)  IV       LOS: 6 days    Time spent: 40 minutes    Daniel Thompson, MD [facility] Pager [phone]  If 7PM-7AM, please contact night-coverage [email or web address] Password [email or web address] 12/19/2017, 10:59 AM  

## 2017-12-19 NOTE — Progress Notes (Signed)
PROGRESS NOTE    Manuel [name]  QQI:[id number] DOB: Oct 31, 1945 DOA: 12/12/2017 PCP: Jani Gravel, MD   Brief Narrative:  Atri Fib, OSA, DM, HTN, who presented to the Ed at [facility] with complaints of constipation that started a ~week ago, Thursday, but then he was able to have a small bowel movement ~5 days ago, none since. Patient underwent flexible sigmoidoscopy which found that patient had a diverticular stricture.  CT scan already showed patient had pneumatosis and surgery to the patient to OR for Hartman's procedure with colostomy and colectomy. Currently further plan is monitor telemetry. Patient noted to have an acute GI bleed as blood noted coming out of colostomy.  Hemoglobin dropped significantly from 11-7.0 over the past 24 hours.  Patient typed and cross and to be transfused 2 units packed red blood cells.  GI to reassess and patient for probable upper endoscopy 12/19/2017.     Assessment & Plan:   Principal Problem:   Colonic stricture (HCC) Active Problems:   Acute GI bleeding   Atrial fibrillation (HCC)   BPH (benign prostatic hyperplasia)   DM (diabetes mellitus) (HCC)   HTN (hypertension)   OSA (obstructive sleep apnea)   Acute posthemorrhagic anemia  #1 colonic stricture/abdominal pain with severe constipation Patient had presented with significant abdominal pain abdominal distention noted to be significantly constipated.  Patient underwent a flex sigmoidoscopy that showed a colonic stricture.  Patient seen in consultation by general surgery patient status post diagnostic laparoscopy, Hartman's procedure with end colostomy per Dr. Marcello Moores 12/13/2017.  Colostomy bag with dark stool.  Patient received IV antibiotics through 12/16/2017 and per general surgery no further antibiotics needed at this time.  Patient now with probable acute upper GI bleed as patient noted to have melanotic stools in colostomy bag as well as bright red blood noted out of colostomy overnight.  General  surgery following.  Will need home health RN on discharge.   2.  A. fib with RVR/hypotension Intraoperatively patient developed A. fib with RVR with hypotension.  Blood pressure improved.  Heart rate in the 150s on ambulation with PT on 12/18/2017.  Heart rates at rest ranging from 90 to the 120s.  Patient noted initially to have a 2.2-second pause on telemetry after he received a dose of Cardizem 180 mg.  Patient noted to have heart rates in the 150s early this morning however patient noted to be somewhat hypotensive and also in the acute GI bleed.  Continue current regimen of Lopressor 25 mg twice daily as well as current dose of Cardizem.  Eliquis has been discontinued secondary to acute GI bleed.  Rate control was discussed with Dr. Einar Gip on 12/18/2017.  Will transfuse 2 units packed red blood cells and follow heart rate.  If rate becomes difficult to control will formally consult with cardiology.   3.  Acute GI bleed likely upper GI bleed/symptomatic anemia/acute blood loss anemia Patient noted to have melanotic stool out of colostomy bag.  Patient also noted to have bright red blood out of colostomy bag overnight per RN note.  Hemoglobin 7.7 this morning from 11.1 on 12/18/2017.  Repeat H&H with a hemoglobin of 7.  Patient complained of fatigue.  Patient noted to have heart rates in the 941D with systolic blood pressures in the 90s.  Patient likely with an acute upper GI bleed.  Eliquis has been discontinued.  We will type and cross and transfuse 2 units packed red blood cells.  Stat anemia panel.  Patient states did  not have any breakfast this morning and as such we will make n.p.o.  Case discussed with Dr. Almyra Free, gastroenterology who will reassess patient and schedule patient for upper endoscopy today.  Placed on a Protonix drip.  Will defer resumption of anticoagulation to gastroenterology pending current upper GI bleed work-up.  4.  Hypertension Patient noted to be hypotensive early this morning.   Likely secondary to acute blood loss.  To be transfused 2 units packed red blood cells.  Monitor closely on Lopressor and Cardizem.   5.  BPH Foley catheter initially placed on 12/16/2017.  Foley catheter has been removed.  Continue doxazosin.  Good urine output.  6.  Diabetes mellitus type 2 CBGs ranging 146 this morning.  Continue to hold oral hypoglycemic agents.  Continue sliding scale insulin.    7.  Acute hypoxic respiratory failure Likely secondary to postop atelectasis.  Patient noted to be hypoxic this morning and placed on O2.  New O2 requirements likely secondary to symptomatic anemia/acute GI bleed.  Transfuse 2 units packed red blood cells.  Follow.    DVT prophylaxis: SCD Code Status: Full Family Communication: Updated patient and family at bedside. Disposition Plan: To be determined.  Home once acute GI bleed has resolved, hemoglobin stabilized and heart rate better controlled.   Consultants:   General surgery: Dr. Marlou Starks 12/12/2017  [place] RN Cristy Hilts, RN 12/15/2017  Gastroenterology: Dr. Benson Norway 12/13/2017  Procedures:   CT abdomen and pelvis 12/11/2017  Flexible sigmoidoscopy 12/13/2017--Per Dr. Benson Norway  Diagnostic laparoscopy, Hartman's procedure with end colostomy per Dr. Marcello Moores 12/13/2017  2 units packed red blood cells pending 12/19/2017  Antimicrobials:   IV Rocephin 12/13/2017>>>>> 12/16/2017     Subjective: Patient laying in bed.  States he feels very tired this morning.  Denies any shortness of breath.  Denies any chest pain.  It is noted per RN notes overnight that patient had red liquid bowel movement and gross blood output via stoma.  Patient also noted to have heart rate in the 150s early this morning around 1:38 AM.    Objective: Vitals:   12/18/17 2046 12/19/17 0503 12/19/17 0835 12/19/17 0840  BP: 1_0   Pulse: 78 (!) 102 96   Resp: _1 Temp: 99.3 F (37.4 C)  97.7 F (36.5 C)   TempSrc: Oral  Oral   SpO2: 92% 98% (!) 79%  100%  Weight:      Height:        Intake/Output Summary (Last 24 hours) at 12/19/2017 1059 Last data filed at 12/19/2017 0851 Gross per 24 hour  Intake -  Output 2650 ml  Net -2650 ml   Filed Weights   12/12/17 1054 12/13/17 0850  Weight: 100.7 kg (222 lb) 100.7 kg (222 lb)    Examination:  General exam: NAD Respiratory system: Clear to auscultation anterior lung fields.  No wheezes, no crackles, no rhonchi. Respiratory effort normal. Cardiovascular system: Irregularly irregular.  No JVD, murmurs rubs or gallops.  No pedal edema.  Gastrointestinal system: Abdomen is soft, nontender, nondistended.  Colostomy with melanotic stool noted.  Positive bowel sounds. Central nervous system: Alert and oriented. No focal neurological deficits. Extremities: Symmetric 5 x 5 power. Skin: No rashes, lesions or ulcers Psychiatry: Judgement and insight appear normal. Mood & affect appropriate.     Data Reviewed: I have personally reviewed following labs and imaging studies  CBC: Recent Labs  Lab 12/12/17 1335  12/14/17 0330 12/15/17 0324 12/16/17 0333 12/18/17 1311 12/19/17  0414 12/19/17 0830  WBC 5.2   < > 9.8 9.1 4.9 4.1 5.8  --   NEUTROABS 3.9  --   --   --   --  2.7  --   --   HGB 15.3   < > 15.4 13.9 13.0 11.1* 7.7* 7.0*  HCT 45.4   < > 49.3 45.8 41.9 35.8* 25.0* 22.2*  MCV 88.0   < > 93.5 95.0 92.9 92.7 91.2  --   PLT 209   < > 201 225 229 192 195  --    < > = values in this interval not displayed.   Basic Metabolic Panel: Recent Labs  Lab 12/15/17 0324 12/16/17 0333 12/17/17 0326 12/18/17 0406 12/19/17 0414  NA 141 140 141 143 143  K 4.6 4.6 3.7 4.0 4.0  CL 105 104 103 102 107  CO2 _0 33* 28  GLUCOSE 98 104* 127* 171* 157*  BUN 22* _1 38*  CREATININE 1.11 0.97 0.93 0.87 1.02  CALCIUM 8.2* 8.0* 8.0* 8.0* 7.8*  MG 2.0  --   --   --  1.5*   GFR: Estimated Creatinine Clearance: 78.2 mL/min (by C-G formula based on SCr of 1.02 mg/dL). Liver Function  Tests: Recent Labs  Lab 12/12/17 1335  AST 17  ALT 15*  ALKPHOS 70  BILITOT 1.4*  PROT 6.4*  ALBUMIN 3.3*   Recent Labs  Lab 12/12/17 1335  LIPASE 21   No results for input(s): AMMONIA in the last 168 hours. Coagulation Profile: Recent Labs  Lab 12/12/17 1335  INR 1.35   Cardiac Enzymes: No results for input(s): CKTOTAL, CKMB, CKMBINDEX, TROPONINI in the last 168 hours. BNP (last 3 results) No results for input(s): PROBNP in the last 8760 hours. HbA1C: Recent Labs    12/19/17 0434  HGBA1C 6.4*   CBG: Recent Labs  Lab 12/18/17 0729 12/18/17 1128 12/18/17 1649 12/18/17 2048 12/19/17 0814  GLUCAP 126* 134* 129* 133* 146*   Lipid Profile: No results for input(s): CHOL, HDL, LDLCALC, TRIG, CHOLHDL, LDLDIRECT in the last 72 hours. Thyroid Function Tests: No results for input(s): TSH, T4TOTAL, FREET4, T3FREE, THYROIDAB in the last 72 hours. Anemia Panel: Recent Labs    12/19/17 0830  VITAMINB12 127*  FOLATE 5.8*  FERRITIN 62  TIBC 168*  IRON 68   Sepsis Labs: Recent Labs  Lab 12/12/17 1343 12/12/17 1603 12/12/17 1835  LATICACIDVEN 0.84 1.49 1.36    Recent Results (from the past 240 hour(s))  MRSA PCR Screening     Status: None   Collection Time: 12/13/17 12:00 PM  Result Value Ref Range Status   MRSA by PCR NEGATIVE NEGATIVE Final    Comment:        The GeneXpert MRSA Assay (FDA approved for NASAL specimens only), is one component of a comprehensive MRSA colonization surveillance program. It is not intended to diagnose MRSA infection nor to guide or monitor treatment for MRSA infections. Performed at [facility], [place] [address], [place],  [address]          Radiology Studies: Dg Chest Port 1 View  Result Date: 12/19/2017 CLINICAL DATA:  Decreased O2 sats EXAM: PORTABLE CHEST 1 VIEW COMPARISON:  03/28/2016 FINDINGS: Mild cardiomegaly. Linear bibasilar atelectasis or scarring, similar to prior study, favor  scarring. No effusions or acute bony abnormality. IMPRESSION: Chronic bibasilar densities, likely scarring.  Mild cardiomegaly. Electronically Signed   By: Rolm Baptise M.D.   On: 12/19/2017 09:35  Scheduled Meds: . diltiazem  120 mg Oral Daily  . doxazosin  8 mg Oral Daily  . famotidine  20 mg Oral QHS  . insulin aspart  0-9 Units Subcutaneous TID WC  . metoprolol tartrate  25 mg Oral BID  . pravastatin  20 mg Oral Daily   Continuous Infusions: . sodium chloride    . lactated ringers 75 mL/hr at 12/19/17 0524  . magnesium sulfate 1 - 4 g bolus IVPB 4 g (12/19/17 0920)  . methocarbamol (ROBAXIN)  IV       LOS: 6 days    Time spent: 40 minutes    Irine Seal, MD [facility] Pager [phone]  If 7PM-7AM, please contact night-coverage [email or web address] Password [email or web address] 12/19/2017, 10:59 AM

## 2017-12-19 NOTE — Progress Notes (Signed)
[facility] paged concerning 550cc red liquid bowel movement this am. Waiting orders.

## 2017-12-19 NOTE — Progress Notes (Signed)
Patient had 550cc of gross blood output via new stoma. [facility] notified 5/29 of same. Paged [facility] coverage to notify of results.

## 2017-12-19 NOTE — Consult Note (Signed)
[place] Nurse ostomy follow up Stoma type/location: LUQ colostomy Stomal assessment/size: Slightly larger than 2 inches, round, dark red, edematous (but resolving), moist.  Os at center Peristomal assessment: intact, clear   Treatment options for stomal/peristomal skin: Skin barrier ring Output: red/black liquid stool. Ostomy pouching: 2pc. 2 and 3/4 inch pouching system with skin barrier ring. Education provided: Patient is independent in pouch emptying, but today is extremely tired (Hgb/Hct is dropping due to bleeding, he has had 3 sticks to get an IV in for the pending blood transfusion, and is NPO for an upper GI this afternoon. Patient's wife cut skin barrier using pattern and after I removed pouch and cleansed bloody stoma and peristomal skin, she stretched the skin barrier ring (I placed it), and she placed the skin barrier and attached the pouch, closing the Lock and Roll tail closure of the pouching system. She assessed for an intact system by noting the condensation on the pouch film. Four (4) 2 and 3/4 inch skin barriers, 4 skin barrier rings, and 4 corresponding pouches are in room along with teaching folder. Patient, wife and son have my contact information for questions. HHRN has been ordered and patient's wife has selected [facility] for that service. Enrolled patient in [facility] Start Discharge program: Yes, on 5/28.  Patient will need follow up Monday and will be seen by one of my partners.  [facility] nursing team will follow, and will remain available to this patient, the nursing, surgical and medical teams.   Thanks, Maudie Flakes, MSN, RN, [place], Arther Abbott  Pager# [phone]

## 2017-12-19 NOTE — Progress Notes (Signed)
Pt's wife selected [facility] for [facility] needs. Referral given to in house rep.

## 2017-12-19 NOTE — Anesthesia Preprocedure Evaluation (Addendum)
Anesthesia Evaluation  Patient identified by MRN, date of birth, ID band Patient awake    Reviewed: Allergy & Precautions, NPO status , Patient's Chart, lab work & pertinent test results  Airway Mallampati: I  TM Distance: >3 FB Neck ROM: Full    Dental  (+) Upper Dentures, Partial Lower   Pulmonary sleep apnea , former smoker,    breath sounds clear to auscultation       Cardiovascular hypertension, Pt. on medications + dysrhythmias Atrial Fibrillation  Rhythm:Irregular Rate:Tachycardia     Neuro/Psych negative neurological ROS  negative psych ROS   GI/Hepatic Neg liver ROS, Diverticular stricture s/p colectomy and hartmans pouch. Now with GI bleed.   Endo/Other  diabetes, Type 2, Oral Hypoglycemic Agents  Renal/GU      Musculoskeletal   Abdominal (+)  Abdomen: tender.    Peds  Hematology negative hematology ROS (+) anemia , Severe anemia in setting of UGI bleed. HCT<15 on iStat in holding. CBC sent for confirmation. Pt only on 1st unit of PRBC's upon arrival to endo suite. Plan to give prbc's' x2 and improvement in BP before starting procedure.   Anesthesia Other Findings   Reproductive/Obstetrics                            Lab Results  Component Value Date   WBC 5.8 12/19/2017   HGB 7.0 (L) 12/19/2017   HCT 22.2 (L) 12/19/2017   MCV 91.2 12/19/2017   PLT 195 12/19/2017   Lab Results  Component Value Date   CREATININE 1.02 12/19/2017   BUN 38 (H) 12/19/2017   NA 143 12/19/2017   K 4.0 12/19/2017   CL 107 12/19/2017   CO2 28 12/19/2017    Anesthesia Physical  Anesthesia Plan  ASA: IV and emergent  Anesthesia Plan: MAC   Post-op Pain Management:    Induction: Intravenous  PONV Risk Score and Plan: 2 and Ondansetron, Treatment may vary due to age or medical condition and Propofol infusion  Airway Management Planned: Natural Airway and Nasal Cannula  Additional  Equipment: None  Intra-op Plan:   Post-operative Plan:   Informed Consent: I have reviewed the patients History and Physical, chart, labs and discussed the procedure including the risks, benefits and alternatives for the proposed anesthesia with the patient or authorized representative who has indicated his/her understanding and acceptance.     Plan Discussed with: CRNA  Anesthesia Plan Comments:        Anesthesia Quick Evaluation

## 2017-12-19 NOTE — Transfer of Care (Signed)
Immediate Anesthesia Transfer of Care Note  Patient: Manuel [name]  Procedure(s) Performed: ESOPHAGOGASTRODUODENOSCOPY (EGD) (N/A )  Patient Location: PACU  Anesthesia Type:MAC  Level of Consciousness: awake, alert , oriented and patient cooperative  Airway & Oxygen Therapy: Patient Spontanous Breathing and Patient connected to face mask oxygen  Post-op Assessment: Report given to RN, Post -op Vital signs reviewed and stable and Patient moving all extremities  Post vital signs: Reviewed and stable  Last Vitals:  Vitals Value Taken Time  BP    Temp    Pulse    Resp 19 12/19/2017  3:38 PM  SpO2    Vitals shown include unvalidated device data.  Last Pain:  Vitals:   12/19/17 1504  TempSrc: Oral  PainSc:       Patients Stated Pain Goal: 1 (92/11/94 1740)  Complications: No apparent anesthesia complications

## 2017-12-20 DIAGNOSIS — K259 Gastric ulcer, unspecified as acute or chronic, without hemorrhage or perforation: Secondary | ICD-10-CM

## 2017-12-20 DIAGNOSIS — B3781 Candidal esophagitis: Secondary | ICD-10-CM

## 2017-12-20 DIAGNOSIS — K921 Melena: Secondary | ICD-10-CM | POA: Diagnosis not present

## 2017-12-20 DIAGNOSIS — K269 Duodenal ulcer, unspecified as acute or chronic, without hemorrhage or perforation: Secondary | ICD-10-CM

## 2017-12-20 DIAGNOSIS — E538 Deficiency of other specified B group vitamins: Secondary | ICD-10-CM | POA: Diagnosis present

## 2017-12-20 HISTORY — DX: Gastric ulcer, unspecified as acute or chronic, without hemorrhage or perforation: K25.9

## 2017-12-20 HISTORY — DX: Duodenal ulcer, unspecified as acute or chronic, without hemorrhage or perforation: K26.9

## 2017-12-20 LAB — MAGNESIUM: Magnesium: 2.3 mg/dL (ref 1.7–2.4)

## 2017-12-20 LAB — GLUCOSE, CAPILLARY
GLUCOSE-CAPILLARY: 117 mg/dL — AB (ref 65–99)
GLUCOSE-CAPILLARY: 109 mg/dL — AB (ref 65–99)
GLUCOSE-CAPILLARY: 89 mg/dL (ref 65–99)
GLUCOSE-CAPILLARY: 96 mg/dL (ref 65–99)
GLUCOSE-CAPILLARY: 95 mg/dL (ref 65–99)
GLUCOSE-CAPILLARY: 80 mg/dL (ref 65–99)

## 2017-12-20 LAB — BASIC METABOLIC PANEL
ANION GAP: 3 — AB (ref 5–15)
BUN: 26 mg/dL — AB (ref 6–20)
CO2: 33 mmol/L — ABNORMAL HIGH (ref 22–32)
Calcium: 7.6 mg/dL — ABNORMAL LOW (ref 8.9–10.3)
Chloride: 109 mmol/L (ref 101–111)
Creatinine, Ser: 0.96 mg/dL (ref 0.61–1.24)
GFR calc Af Amer: >60 mL/min (ref >60)
GFR calc non Af Amer: >60 mL/min (ref >60)
Glucose, Bld: 117 mg/dL — ABNORMAL HIGH (ref 65–99)
POTASSIUM: 3.9 mmol/L (ref 3.5–5.1)
SODIUM: 145 mmol/L (ref 135–145)

## 2017-12-20 LAB — HEMOGLOBIN AND HEMATOCRIT, BLOOD
HCT: 28.5 % — ABNORMAL LOW (ref 39.0–52.0)
Hemoglobin: 9.1 g/dL — ABNORMAL LOW (ref 13.0–17.0)

## 2017-12-20 LAB — CBC
HCT: 29.8 % — ABNORMAL LOW (ref 39.0–52.0)
HEMOGLOBIN: 9.5 g/dL — AB (ref 13.0–17.0)
MCH: 29.2 pg (ref 26.0–34.0)
MCHC: 31.9 g/dL (ref 30.0–36.0)
MCV: 91.7 fL (ref 78.0–100.0)
Platelets: 157 10*3/uL (ref 150–400)
RBC: 3.25 MIL/uL — AB (ref 4.22–5.81)
RDW: 15.8 % — ABNORMAL HIGH (ref 11.5–15.5)
WBC: 7 10*3/uL (ref 4.0–10.5)

## 2017-12-20 MED ORDER — IPRATROPIUM BROMIDE 0.02 % IN SOLN: 0.5000 mg | RESPIRATORY_TRACT | Status: DC | PRN

## 2017-12-20 MED ORDER — IPRATROPIUM BROMIDE 0.02 % IN SOLN: 0.5000 mg | Freq: Two times a day (BID) | RESPIRATORY_TRACT | Status: DC

## 2017-12-20 MED ORDER — ORAL CARE MOUTH RINSE
15.0000 mL | Freq: Two times a day (BID) | OROMUCOSAL | Status: DC
  Administered 2017-12-20 – 2017-12-23 (×8): 15 mL via OROMUCOSAL

## 2017-12-20 MED ORDER — CYANOCOBALAMIN 1000 MCG/ML IJ SOLN
1000.0000 ug | Freq: Every day | INTRAMUSCULAR | Status: DC
  Administered 2017-12-20 – 2017-12-23 (×4): 1000 ug via INTRAMUSCULAR
  Filled 2017-12-20 (×4): qty 1

## 2017-12-20 MED ORDER — LEVALBUTEROL HCL 0.63 MG/3ML IN NEBU
0.6300 mg | INHALATION_SOLUTION | RESPIRATORY_TRACT | Status: DC | PRN
Start: 2017-12-20 — End: 2017-12-23

## 2017-12-20 MED ORDER — IPRATROPIUM BROMIDE 0.02 % IN SOLN: 0.5000 mg | Freq: Four times a day (QID) | RESPIRATORY_TRACT | Status: DC

## 2017-12-20 MED ORDER — LEVALBUTEROL HCL 0.63 MG/3ML IN NEBU: 0.6300 mg | INHALATION_SOLUTION | Freq: Two times a day (BID) | RESPIRATORY_TRACT | Status: DC

## 2017-12-20 MED ORDER — CHLORHEXIDINE GLUCONATE 0.12 % MT SOLN
15.0000 mL | Freq: Two times a day (BID) | OROMUCOSAL | Status: DC
  Administered 2017-12-20 – 2017-12-23 (×6): 15 mL via OROMUCOSAL
  Filled 2017-12-20 (×5): qty 15

## 2017-12-20 MED ORDER — LEVALBUTEROL HCL 0.63 MG/3ML IN NEBU: 0.6300 mg | INHALATION_SOLUTION | RESPIRATORY_TRACT | Status: DC | PRN

## 2017-12-20 MED ORDER — LEVALBUTEROL HCL 0.63 MG/3ML IN NEBU: 0.6300 mg | INHALATION_SOLUTION | Freq: Four times a day (QID) | RESPIRATORY_TRACT | Status: DC

## 2017-12-20 NOTE — Anesthesia Postprocedure Evaluation (Signed)
Anesthesia Post Note  Patient: Manuel [name]  Procedure(s) Performed: ESOPHAGOGASTRODUODENOSCOPY (EGD) (N/A ) BIOPSY     Patient location during evaluation: PACU Anesthesia Type: MAC Level of consciousness: awake and alert Pain management: pain level controlled Vital Signs Assessment: post-procedure vital signs reviewed and stable Respiratory status: spontaneous breathing, nonlabored ventilation, respiratory function stable and patient connected to nasal cannula oxygen Cardiovascular status: stable and blood pressure returned to baseline Postop Assessment: no apparent nausea or vomiting Anesthetic complications: no    Last Vitals:  Vitals:   12/20/17 1707 12/20/17 1800  BP: (!) 92/52 (!) 97/56  Pulse: 80 83  Resp: 16 16  Temp:    SpO2: 96% 94%    Last Pain:  Vitals:   12/20/17 1200  TempSrc: Oral  PainSc:                  Tiajuana Amass

## 2017-12-20 NOTE — Progress Notes (Signed)
Subjective: Feeling well.  Objective: Vital signs in last 24 hours: Temp:  [97.5 F (36.4 C)-98.3 F (36.8 C)] 98.1 F (36.7 C) (05/31 0410) Pulse Rate:  [68-109] 87 (05/31 0600) Resp:  [10-21] 13 (05/31 0600) BP: (81-111)/(40-74) 109/49 (05/31 0600) SpO2:  [79 %-100 %] 100 % (05/31 0600) Weight:  [101 kg (222 lb 10.6 oz)] 101 kg (222 lb 10.6 oz) (05/31 0430) Last BM Date: 12/19/17  Intake/Output from previous day: 05/30 0701 - 05/31 0700 In: 4419 [I.V.:2871.7; Blood:1297.3; IV Piggyback:250] Out: 1525 [Urine:825; Stool:700] Intake/Output this shift: No intake/output data recorded.  General appearance: alert and no distress GI: soft, non-tender; bowel sounds normal; no masses,  no organomegaly and melenic stool in the ostomy bag  Lab Results: Recent Labs    12/18/17 1311 12/19/17 0414 12/19/17 0830 12/19/17 1355 12/19/17 1725  WBC 4.1 5.8  --  6.6  --   HGB 11.1* 7.7* 7.0* 6.9* 7.7*  HCT 35.8* 25.0* 22.2* 20.9* 23.5*  PLT 192 195  --  179  179  --    BMET Recent Labs    12/18/17 0406 12/19/17 0414  NA 143 143  K 4.0 4.0  CL 102 107  CO2 33* 28  GLUCOSE 171* 157*  BUN 19 38*  CREATININE 0.87 1.02  CALCIUM 8.0* 7.8*   LFT No results for input(s): PROT, ALBUMIN, AST, ALT, ALKPHOS, BILITOT, BILIDIR, IBILI in the last 72 hours. PT/INR Recent Labs    12/19/17 1355  LABPROT 15.7*  INR 1.27   Hepatitis Panel No results for input(s): HEPBSAG, HCVAB, HEPAIGM, HEPBIGM in the last 72 hours. C-Diff No results for input(s): CDIFFTOX in the last 72 hours. Fecal Lactopherrin No results for input(s): FECLLACTOFRN in the last 72 hours.  Studies/Results: Dg Chest Port 1 View  Result Date: 12/19/2017 CLINICAL DATA:  Decreased O2 sats EXAM: PORTABLE CHEST 1 VIEW COMPARISON:  03/28/2016 FINDINGS: Mild cardiomegaly. Linear bibasilar atelectasis or scarring, similar to prior study, favor scarring. No effusions or acute bony abnormality. IMPRESSION: Chronic bibasilar  densities, likely scarring.  Mild cardiomegaly. Electronically Signed   By: Rolm Baptise M.D.   On: 12/19/2017 09:35    Medications:  Scheduled: . chlorhexidine  15 mL Mouth Rinse BID  . diltiazem  120 mg Oral Daily  . doxazosin  8 mg Oral Daily  . fluconazole  100 mg Oral Daily  . furosemide  20 mg Intravenous Once  . insulin aspart  0-9 Units Subcutaneous TID WC  . mouth rinse  15 mL Mouth Rinse q12n4p  . metoprolol tartrate  25 mg Oral BID  . [START ON 12/22/2017] pantoprazole  40 mg Intravenous Q12H  . pravastatin  20 mg Oral Daily   Continuous: . lactated ringers 75 mL/hr at 12/20/17 0410  . methocarbamol (ROBAXIN)  IV    . pantoprozole (PROTONIX) infusion 8 mg/hr (12/20/17 0410)    Assessment/Plan: 1) Antral gastric ulcer and/or duodenal bulb ulcer. 2) Anemia.   The patient had some mild hypotension last evening and he continued to have melenic stools.  Clinically he feels well.  The repeat CBC is pending this AM.  Plan: 1) Await AM CBC. 2) Possible bleeding scan this AM.  LOS: 7 days   Irania Durell D 12/20/2017, 7:25 AM

## 2017-12-20 NOTE — Progress Notes (Signed)
[address] [place], [place] [address] Phone: [phone]  FAX: [phone]      TINA TEMME [id number] 01-19-1946  CARE TEAM:  PCP: Jani Gravel, MD  Outpatient Care Team: Patient Care Team: Jani Gravel, MD as PCP - General (Internal Medicine) Adrian Prows, MD as Consulting Physician (Cardiology) Michael Boston, MD as Consulting Physician (General Surgery) Jackelyn Knife, MD as Rounding Team (Internal Medicine)  Inpatient Treatment Team: Treatment Team: Attending Provider: Eugenie Filler, MD; Consulting Physician: Edison Pace, Md, MD; Rounding Team: Redmond Baseman, MD; Consulting Physician: Carol Ada, MD; Registered Nurse: Lissa Morales Rudi Heap, RN; Registered Nurse: Nils Flack, RN; Registered Nurse: Derenda Fennel, RN; Registered Nurse: Teodoro Spray, RN; Case Manager: Frann Rider, RN   Problem List:   Principal Problem:   Colonic stricture [facility]) Active Problems:   Atrial fibrillation [facility])   BPH (benign prostatic hyperplasia)   DM (diabetes mellitus) ([place])   HTN (hypertension)   OSA (obstructive sleep apnea)   Acute posthemorrhagic anemia   Acute GI bleeding   Hypoxia   Low oxygen saturation   Esophageal candidiasis ([place])   Gastric ulcer without hemorrhage or perforation   Duodenal ulcer   Melena   Vitamin B12 deficiency   1 Day Post-Op  12/19/2017  Procedure(s): ESOPHAGOGASTRODUODENOSCOPY (EGD) BIOPSY  12/13/2017  POST-OPERATIVE DIAGNOSIS:  sigmoid stricture  PROCEDURE:   DIAGNOSTIC LAPAROSCOPY HARTMAN'S PROCEDURE with  END COLOSTOMY     Surgeon(s): Leighton Ruff, MD   Assessment  Recovering status post [facility] resection for sigmoid stricture with postoperative GI bleeding most likely of gastric possible small intestinal etiology.  Plan:  -Discussed with Dr. Benson Norway with gastroenterology at the bedside.  He wishes to wait another 24 hours to make sure the patient has no  more bleeding while n.p.o.  -If patient bleeds again, agree with possible bleeding scan to rule out small intestinal/midgut source.  Continue continuous IV proton pump inhibitors for presumed ulcer disease as the source.  Digestive tract otherwise stable from a surgery standpoint.  Will follow more peripherally.  Advance diet as tolerated. -VTE prophylaxis- SCDs, etc -mobilize as tolerated to help recovery  20 minutes spent in review, evaluation, examination, counseling, and coordination of care.  More than 50% of that time was spent in counseling.  Adin Hector, M.D., F.A.C.S. Gastrointestinal and Minimally Invasive Surgery [facility] [address], [place], [place] [address] [phone] Main / Paging   12/20/2017    Subjective: (Chief complaint)  Patient denies pain.  Transfused.  Hemoglobin came up.  No more bleeding issues.  Hungry.  Denies nausea.    Dr. Benson Norway at bedside.  Objective:  Vital signs:  Vitals:   12/20/17 0700 12/20/17 0715 12/20/17 0800 12/20/17 0845  BP: (!) 102/58 (!) 106/54  (!) 99/55  Pulse: 86 87  87  Resp: _0 Temp:   97.9 F (36.6 C)   TempSrc:   Oral   SpO2: 100% 97%  98%  Weight:      Height:        Last BM Date: 12/20/17  Intake/Output   Yesterday:  05/30 0701 - 05/31 0700 In: 4489.8 [I.V.:2942.5; Blood:1297.3; IV Piggyback:250] Out: 8416 [Urine:825; Stool:700] This shift:  Total I/O In: 412.5 [I.V.:412.5] Out: 500 [Urine:250; Stool:250]  Bowel function:  Flatus: YES  BM:  YES  Drain: (No drain)   Physical Exam:  General: Pt awake/alert/oriented x4 in no acute  distress Eyes: PERRL, normal EOM.  Sclera clear.  No icterus Neuro: CN II-XII intact w/o focal sensory/motor deficits. Lymph: No head/neck/groin lymphadenopathy Psych:  No delerium/psychosis/paranoia HENT: Normocephalic, Mucus membranes moist.  No thrush Neck: Supple, No tracheal deviation Chest: No chest  wall pain w good excursion CV:  Pulses intact.  Regular rhythm MS: Normal AROM mjr joints.  No obvious deformity  Abdomen: Soft.  Nondistended.  Mildly tender at incisions only.  Midline incision c/d/i.   Left lower quadrant colostomy pink with flatus and dark green effluent in bag.  No melena.  No blood.  No evidence of peritonitis.  No incarcerated hernias.  Ext:   No deformity.  No mjr edema.  No cyanosis Skin: No petechiae / purpura  Results:   Labs: Results for orders placed or performed during the hospital encounter of 12/12/17 (from the past 48 hour(s))  Glucose, capillary     Status: Abnormal   Collection Time: 12/18/17 11:28 AM  Result Value Ref Range   Glucose-Capillary 134 (H) 65 - 99 mg/dL  CBC with Differential/Platelet     Status: Abnormal   Collection Time: 12/18/17  1:11 PM  Result Value Ref Range   WBC 4.1 4.0 - 10.5 K/uL   RBC 3.86 (L) 4.22 - 5.81 MIL/uL   Hemoglobin 11.1 (L) 13.0 - 17.0 g/dL   HCT 35.8 (L) 39.0 - 52.0 %   MCV 92.7 78.0 - 100.0 fL   MCH 28.8 26.0 - 34.0 pg   MCHC 31.0 30.0 - 36.0 g/dL   RDW 15.3 11.5 - 15.5 %   Platelets 192 150 - 400 K/uL   Neutrophils Relative % 67 %   Neutro Abs 2.7 1.7 - 7.7 K/uL   Lymphocytes Relative 25 %   Lymphs Abs 1.0 0.7 - 4.0 K/uL   Monocytes Relative 7 %   Monocytes Absolute 0.3 0.1 - 1.0 K/uL   Eosinophils Relative 1 %   Eosinophils Absolute 0.0 0.0 - 0.7 K/uL   Basophils Relative 0 %   Basophils Absolute 0.0 0.0 - 0.1 K/uL    Comment: Performed at [facility], [place] [address] [place], [place] [address]  Glucose, capillary     Status: Abnormal   Collection Time: 12/18/17  4:49 PM  Result Value Ref Range   Glucose-Capillary 129 (H) 65 - 99 mg/dL  Glucose, capillary     Status: Abnormal   Collection Time: 12/18/17  8:48 PM  Result Value Ref Range   Glucose-Capillary 133 (H) 65 - 99 mg/dL  Basic metabolic panel     Status: Abnormal   Collection Time: 12/19/17  4:14 AM  Result Value Ref  Range   Sodium 143 135 - 145 mmol/L   Potassium 4.0 3.5 - 5.1 mmol/L   Chloride 107 101 - 111 mmol/L   CO2 28 22 - 32 mmol/L   Glucose, Bld 157 (H) 65 - 99 mg/dL   BUN 38 (H) 6 - 20 mg/dL   Creatinine, Ser 1.02 0.61 - 1.24 mg/dL   Calcium 7.8 (L) 8.9 - 10.3 mg/dL   GFR calc non Af Amer >60 >60 mL/min   GFR calc Af Amer >60 >60 mL/min    Comment: (NOTE) The eGFR has been calculated using the CKD EPI equation. This calculation has not been validated in all clinical situations. eGFR's persistently <60 mL/min signify possible Chronic Kidney Disease.    Anion gap 8 5 - 15    Comment: Performed at [facility], [place]  [address] [place], [place] [address]  CBC     Status: Abnormal   Collection Time: 12/19/17  4:14 AM  Result Value Ref Range   WBC 5.8 4.0 - 10.5 K/uL   RBC 2.74 (L) 4.22 - 5.81 MIL/uL   Hemoglobin 7.7 (L) 13.0 - 17.0 g/dL    Comment: REPEATED TO VERIFY DELTA CHECK NOTED    HCT 25.0 (L) 39.0 - 52.0 %   MCV 91.2 78.0 - 100.0 fL   MCH 28.1 26.0 - 34.0 pg   MCHC 30.8 30.0 - 36.0 g/dL   RDW 15.1 11.5 - 15.5 %   Platelets 195 150 - 400 K/uL    Comment: Performed at [facility], [place] [address] [place], [place] [address]  Magnesium     Status: Abnormal   Collection Time: 12/19/17  4:14 AM  Result Value Ref Range   Magnesium 1.5 (L) 1.7 - 2.4 mg/dL    Comment: Performed at [facility], [place] [address], [place], [place] [address]  Hemoglobin A1c     Status: Abnormal   Collection Time: 12/19/17  4:34 AM  Result Value Ref Range   Hgb A1c MFr Bld 6.4 (H) 4.8 - 5.6 %    Comment: (NOTE) Pre diabetes:          5.7%-6.4% Diabetes:              >6.4% Glycemic control for   <7.0% adults with diabetes    Mean Plasma Glucose 136.98 mg/dL    Comment: Performed at [place] [address] [place], [place] [address]  Glucose, capillary     Status: Abnormal   Collection Time: 12/19/17  8:14 AM  Result Value Ref  Range   Glucose-Capillary 146 (H) 65 - 99 mg/dL  Type and screen [place]     Status: None (Preliminary result)   Collection Time: 12/19/17  8:30 AM  Result Value Ref Range   ABO/RH(D) A POS    Antibody Screen NEG    Sample Expiration 12/22/2017    Unit Number [id number]    Blood Component Type RED CELLS,LR    Unit division 00    Status of Unit ISSUED,FINAL    Transfusion Status OK TO TRANSFUSE    Crossmatch Result Compatible    Unit Number [id number]    Blood Component Type RED CELLS,LR    Unit division 00    Status of Unit ISSUED,FINAL    Transfusion Status OK TO TRANSFUSE    Crossmatch Result Compatible    Unit Number [id number]    Blood Component Type RED CELLS,LR    Unit division 00    Status of Unit ISSUED    Transfusion Status OK TO TRANSFUSE    Crossmatch Result Compatible    Unit Number [id number]    Blood Component Type RED CELLS,LR    Unit division 00    Status of Unit ISSUED,FINAL    Transfusion Status OK TO TRANSFUSE    Crossmatch Result      Compatible Performed at [place] [address] [place], [place] [address]   Hemoglobin and hematocrit, blood     Status: Abnormal   Collection Time: 12/19/17  8:30 AM  Result Value Ref Range   Hemoglobin 7.0 (L) 13.0 - 17.0 g/dL   HCT 22.2 (L) 39.0 - 52.0 %    Comment: Performed at [facility], [place] [address], [place], [place] [address]  Vitamin B12  Status: Abnormal   Collection Time: 12/19/17  8:30 AM  Result Value Ref Range   Vitamin B-12 127 (L) 180 - 914 pg/mL    Comment: (NOTE) This assay is not validated for testing neonatal or myeloproliferative syndrome specimens for Vitamin B12 levels. Performed at [facility], [place] [address], [place], [place] [address]   Iron and TIBC     Status: Abnormal   Collection Time: 12/19/17  8:30 AM  Result Value Ref Range   Iron 68 45 - 182 ug/dL   TIBC 168 (L) 250 - 450  ug/dL   Saturation Ratios 40 (H) 17.9 - 39.5 %   UIBC 100 ug/dL    Comment: Performed at [facility], [place] [address] [place], [place] [address]  Ferritin     Status: None   Collection Time: 12/19/17  8:30 AM  Result Value Ref Range   Ferritin 62 24 - 336 ng/mL    Comment: Performed at [facility], [place] [address], [place], [place] [address]  Folate     Status: Abnormal   Collection Time: 12/19/17  8:30 AM  Result Value Ref Range   Folate 5.8 (L) >5.9 ng/mL    Comment: Performed at [facility], [place] [address] [place], [place] [address]  ABO/Rh     Status: None   Collection Time: 12/19/17  8:30 AM  Result Value Ref Range   ABO/RH(D)      A POS Performed at [facility], [place] [address] [place], [place] [address]   Prepare RBC     Status: None   Collection Time: 12/19/17  8:59 AM  Result Value Ref Range   Order Confirmation      ORDER PROCESSED BY BLOOD BANK Performed at [place] [address], [place], [place] [address]   DIC (disseminated intravasc coag) panel     Status: Abnormal   Collection Time: 12/19/17  1:55 PM  Result Value Ref Range   Prothrombin Time 15.7 (H) 11.4 - 15.2 seconds   INR 1.27    aPTT 30 24 - 36 seconds   Fibrinogen 338 210 - 475 mg/dL   D-Dimer, Quant 2.04 (H) 0.00 - 0.50 ug/mL-FEU    Comment: (NOTE) At the manufacturer cut-off of 0.50 ug/mL FEU, this assay has been documented to exclude PE with a sensitivity and negative predictive value of 97 to 99%.  At this time, this assay has not been approved by the FDA to exclude DVT/VTE. Results should be correlated with clinical presentation.    Platelets 179 150 - 400 K/uL   Smear Review NO SCHISTOCYTES SEEN     Comment: Performed at [facility], [place] [address], [place], [place] [address]  CBC     Status: Abnormal   Collection Time: 12/19/17  1:55 PM  Result Value Ref Range   WBC  6.6 4.0 - 10.5 K/uL    Comment: WHITE COUNT CONFIRMED ON SMEAR   RBC 2.32 (L) 4.22 - 5.81 MIL/uL   Hemoglobin 6.9 (LL) 13.0 - 17.0 g/dL    Comment: REPEATED TO VERIFY CRITICAL RESULT CALLED TO, READ BACK BY AND VERIFIED WITH: LISA NUNN,RN [id number] @ 1419 BY J SCOTTON    HCT 20.9 (L) 39.0 - 52.0 %   MCV 90.1 78.0 - 100.0 fL   MCH 29.7 26.0 - 34.0 pg   MCHC 33.0 30.0 - 36.0 g/dL   RDW 15.2 11.5 - 15.5 %  Platelets 179 150 - 400 K/uL    Comment: Performed at [facility], [place] [address], [place], [place] [address]  Glucose, capillary     Status: Abnormal   Collection Time: 12/19/17  4:28 PM  Result Value Ref Range   Glucose-Capillary 106 (H) 65 - 99 mg/dL   Comment 1 Notify RN    Comment 2 Document in Chart   Hemoglobin and hematocrit, blood     Status: Abnormal   Collection Time: 12/19/17  5:25 PM  Result Value Ref Range   Hemoglobin 7.7 (L) 13.0 - 17.0 g/dL   HCT 23.5 (L) 39.0 - 52.0 %    Comment: Performed at [facility], [place] [address] [place], [place] [address]  Glucose, capillary     Status: Abnormal   Collection Time: 12/19/17  9:04 PM  Result Value Ref Range   Glucose-Capillary 117 (H) 65 - 99 mg/dL   Comment 1 Notify RN    Comment 2 Document in Chart   Occult blood card to lab, stool     Status: Abnormal   Collection Time: 12/19/17 10:38 PM  Result Value Ref Range   Fecal Occult Bld POSITIVE (A) NEGATIVE    Comment: Performed at [facility], [place] [address] [place], [place] [address]  Magnesium     Status: None   Collection Time: 12/20/17  7:32 AM  Result Value Ref Range   Magnesium 2.3 1.7 - 2.4 mg/dL    Comment: Performed at [facility], [place] [address] [place], [place] [address]  CBC     Status: Abnormal   Collection Time: 12/20/17  7:32 AM  Result Value Ref Range   WBC 7.0 4.0 - 10.5 K/uL   RBC 3.25 (L) 4.22 - 5.81 MIL/uL   Hemoglobin 9.5 (L) 13.0 - 17.0 g/dL   HCT 29.8 (L) 39.0 -  52.0 %   MCV 91.7 78.0 - 100.0 fL   MCH 29.2 26.0 - 34.0 pg   MCHC 31.9 30.0 - 36.0 g/dL   RDW 15.8 (H) 11.5 - 15.5 %   Platelets 157 150 - 400 K/uL    Comment: Performed at [facility] [place] [address], [place], [place] [address]  Basic metabolic panel     Status: Abnormal   Collection Time: 12/20/17  7:32 AM  Result Value Ref Range   Sodium 145 135 - 145 mmol/L   Potassium 3.9 3.5 - 5.1 mmol/L   Chloride 109 101 - 111 mmol/L   CO2 33 (H) 22 - 32 mmol/L   Glucose, Bld 117 (H) 65 - 99 mg/dL   BUN 26 (H) 6 - 20 mg/dL   Creatinine, Ser 0.96 0.61 - 1.24 mg/dL   Calcium 7.6 (L) 8.9 - 10.3 mg/dL   GFR calc non Af Amer >60 >60 mL/min   GFR calc Af Amer >60 >60 mL/min    Comment: (NOTE) The eGFR has been calculated using the CKD EPI equation. This calculation has not been validated in all clinical situations. eGFR's persistently <60 mL/min signify possible Chronic Kidney Disease.    Anion gap 3 (L) 5 - 15    Comment: Performed at [facility], [place] [address], [place], [place] [address]  Glucose, capillary     Status: Abnormal   Collection Time: 12/20/17  7:32 AM  Result Value Ref Range   Glucose-Capillary 109 (H) 65 - 99 mg/dL   Comment 1 Notify RN    Comment 2  Document in Chart     Imaging / Studies: Dg Chest Port 1 View  Result Date: 12/19/2017 CLINICAL DATA:  Decreased O2 sats EXAM: PORTABLE CHEST 1 VIEW COMPARISON:  03/28/2016 FINDINGS: Mild cardiomegaly. Linear bibasilar atelectasis or scarring, similar to prior study, favor scarring. No effusions or acute bony abnormality. IMPRESSION: Chronic bibasilar densities, likely scarring.  Mild cardiomegaly. Electronically Signed   By: Rolm Baptise M.D.   On: 12/19/2017 09:35    Medications / Allergies: per chart  Antibiotics: Anti-infectives (From admission, onward)   Start     Dose/Rate Route Frequency Ordered Stop   12/19/17 1800  fluconazole (DIFLUCAN) tablet 100 mg     100 mg Oral Daily  12/19/17 1542     12/13/17 2200  metroNIDAZOLE (FLAGYL) IVPB 500 mg  Status:  Discontinued     500 mg 100 mL/hr over 60 Minutes Intravenous Every 8 hours 12/13/17 1859 12/16/17 0741   12/13/17 1930  cefTRIAXone (ROCEPHIN) injection 1 g  Status:  Discontinued     1 g Intramuscular Every 24 hours 12/13/17 1859 12/13/17 1910   12/13/17 1930  cefTRIAXone (ROCEPHIN) injection 2 g  Status:  Discontinued     2 g Intramuscular Every 24 hours 12/13/17 1910 12/13/17 1912   12/13/17 1930  cefTRIAXone (ROCEPHIN) 2 g in sodium chloride 0.9 % 100 mL IVPB  Status:  Discontinued     2 g 200 mL/hr over 30 Minutes Intravenous Every 24 hours 12/13/17 1912 12/16/17 0741   12/13/17 1400  cefoTEtan (CEFOTAN) 2 g in sodium chloride 0.9 % 100 mL IVPB  Status:  Discontinued    Note to Pharmacy:  Pharmacy may adjust dose strength for optimal dosing.   Send with patient on call to the OR.  Anesthesia to complete antibiotic administration <80mn [facility]   2 g 200 mL/hr over 30 Minutes Intravenous To Surgery 12/13/17 1105 12/13/17 1859   12/12/17 2200  metroNIDAZOLE (FLAGYL) IVPB 500 mg  Status:  Discontinued     500 mg 100 mL/hr over 60 Minutes Intravenous Every 8 hours 12/12/17 2109 12/13/17 1859        Note: Portions of this report may have been transcribed using voice recognition software. Every effort was made to ensure accuracy; however, inadvertent computerized transcription errors may be present.   Any transcriptional errors that result from this process are unintentional.     SAdin Hector M.D., F.A.C.S. Gastrointestinal and Minimally Invasive Surgery [facility] [address] [place] [phone] / Paging   12/20/2017

## 2017-12-20 NOTE — Progress Notes (Signed)
PROGRESS NOTE    Manuel [name]  BDZ:[id number] DOB: Jan 29, 1946 DOA: 12/12/2017 PCP: Jani Gravel, MD   Brief Narrative:  Atri Fib, OSA, DM, HTN, who presented to the Ed at [facility] with complaints of constipation that started a ~week ago, Thursday, but then he was able to have a small bowel movement ~5 days ago, none since. Patient underwent flexible sigmoidoscopy which found that patient had a diverticular stricture.  CT scan already showed patient had pneumatosis and surgery to the patient to OR for Hartman's procedure with colostomy and colectomy. Currently further plan is monitor telemetry. Patient noted to have an acute GI bleed as blood noted coming out of colostomy.  Hemoglobin dropped significantly from 11-7.0 over a 24 hour period.  Patient typed and cross and to be transfused 4 units packed red blood cells on 12/19/2017.  GI reassessed patient he underwent upper endoscopy 12/19/2017 that showed non-bleeding gastric ulcers and plaques in the esophagus concerning for candidiasis.      Assessment & Plan:   Principal Problem:   Colonic stricture (HCC) Active Problems:   Acute GI bleeding   Atrial fibrillation (HCC)   Acute posthemorrhagic anemia   Gastric ulcer without hemorrhage or perforation   Duodenal ulcer   BPH (benign prostatic hyperplasia)   DM (diabetes mellitus) (HCC)   HTN (hypertension)   OSA (obstructive sleep apnea)   Hypoxia   Low oxygen saturation   Esophageal candidiasis (HCC)   Melena   Vitamin B12 deficiency  #1 colonic stricture/abdominal pain with severe constipation Patient had presented with significant abdominal pain abdominal distention noted to be significantly constipated.  Patient underwent a flex sigmoidoscopy that showed a colonic stricture.  Patient seen in consultation by general surgery patient status post diagnostic laparoscopy, Hartman's procedure with end colostomy per Dr. Marcello Moores 12/13/2017.  Colostomy bag with dark stool.  Patient received IV  antibiotics through 12/16/2017 and per general surgery no further antibiotics needed at this time.  Patient now with probable acute upper GI bleed as patient noted to have melanotic stools in colostomy bag as well as bright red blood noted out of colostomy overnight.  General surgery following.  Will need home health RN on discharge.   2.  A. fib with RVR/hypotension Intraoperatively patient developed A. fib with RVR with hypotension.  Blood pressure improved.  Heart rate in the 150s on ambulation with PT on 12/18/2017.  Heart rates at rest ranging from 90 to the 120s.  Patient noted initially to have a 2.2-second pause on telemetry after he received a dose of Cardizem 180 mg.  Patient noted to have heart rates in the 150s early morning on 12/19/2017, however patient noted to be somewhat hypotensive and also in the acute GI bleed.  Heart rate improved with packed red blood cell transfusion and current regimen of Lopressor 25 mg twice daily as well as current dose of Cardizem.  Eliquis has been discontinued secondary to acute GI bleed.  Rate control was discussed with Dr. Einar Gip on 12/18/2017.  If rate becomes difficult to control will formally consult with cardiology.   3.  Acute GI bleed likely upper GI bleed/symptomatic anemia/acute blood loss anemia/gastric ulcers/duodenal ulcers Patient noted to have melanotic stool out of colostomy bag on 5/29 - 12/19/2017.  Patient also noted to have bright red blood out of colostomy bag overnight per RN note.  Hemoglobin 9.5 from 7.7 from 11.1 on 12/18/2017.  Patient status post 4 units packed red blood cells on 12/19/2017.  Gastroenterology was reconsulted  and patient reassessed by Dr. Benson Norway 12/19/2017 and patient underwent upper endoscopy that showed nonbleeding gastric ulcers as well as plaques in the esophagus concerning for candidiasis.  Patient noted to have some melanotic stools overnight.  Stools this morning somewhat greenish.  Patient currently n.p.o. and on the bowel  rest.  Continue Protonix drip.  Per GI if further bleeding may need a bleeding scan.  GI following and appreciate their input and recommendations.  4.  Esophageal candidiasis Upper endoscopy on 12/19/2017.  Continue Diflucan.  5.  Hypertension Patient noted to be hypotensive the morning of 12/19/2017.  Secondary to acute blood loss.  Some improvement with blood pressure however still borderline.  Status post 4 units packed red blood cells 12/19/2017.  Follow closely on Lopressor and Cardizem.   6.  BPH Foley catheter initially placed on 12/16/2017.  Foley catheter has been removed.  Continue doxazosin.  Good urine output.  7.  Diabetes mellitus type 2 CBGs ranging 109 this morning.  Continue to hold oral hypoglycemic agents.  Continue sliding scale insulin.    8.  Acute hypoxic respiratory failure Likely secondary to postop atelectasis.  Patient noted to be hypoxic the morning of 12/19/2017 and placed on O2.  New O2 requirements likely secondary to symptomatic anemia/acute GI bleed.  Some improvement.  Patient with some minimal expiratory wheezing however history of tobacco use.  Place on scheduled Xopenex and Atrovent nebs.  Status post 4 units packed red blood cells.    9 tobacco abuse Tobacco cessation.  Place on scheduled nebs.  10.  Vitamin B12 deficiency Placed on vitamin B12 IM 1000 MCG's daily x7 days, and then weekly x1 month, and then monthly.  Will need outpatient follow-up.   DVT prophylaxis: SCD Code Status: Full Family Communication: Updated patient and wife at bedside. Disposition Plan: Transfer to stepdown unit.  Home once acute GI bleed has resolved, hemoglobin stabilized and heart rate better controlled.   Consultants:   General surgery: Dr. Marlou Starks 12/12/2017  [place] RN Cristy Hilts, RN 12/15/2017  Gastroenterology: Dr. Benson Norway 12/13/2017  Procedures:   CT abdomen and pelvis 12/11/2017  Flexible sigmoidoscopy 12/13/2017--Per Dr. Benson Norway  Diagnostic laparoscopy, Hartman's  procedure with end colostomy per Dr. Marcello Moores 12/13/2017  4 units packed red blood cells pending 12/19/2017  Upper endoscopy 12/19/2017 per Dr. Benson Norway  Antimicrobials:   IV Rocephin 12/13/2017>>>>> 12/16/2017     Subjective: Patient in bed.  States fatigue has improved from yesterday.  Denies any chest pain.  No shortness of breath.  Patient noted to have some ongoing melanotic stools overnight.   Objective: Vitals:   12/20/17 0700 12/20/17 0715 12/20/17 0800 12/20/17 0845  BP: (!) 102/58 (!) 106/54  (!) 99/55  Pulse: 86 87  87  Resp: _0 Temp:   97.9 F (36.6 C)   TempSrc:   Oral   SpO2: 100% 97%  98%  Weight:      Height:        Intake/Output Summary (Last 24 hours) at 12/20/2017 1053 Last data filed at 12/20/2017 0900 Gross per 24 hour  Intake 4827.34 ml  Output 1725 ml  Net 3102.34 ml   Filed Weights   12/12/17 1054 12/13/17 0850 12/20/17 0430  Weight: 100.7 kg (222 lb) 100.7 kg (222 lb) 101 kg (222 lb 10.6 oz)    Examination:  General exam: NAD Respiratory system: Minimal expiratory wheezing.  No crackles.  No rhonchi.  Respiratory effort normal. Cardiovascular system: Irregularly irregular.  No JVD, murmurs  rubs or gallops.  No pedal edema.  Gastrointestinal system: Abdomen is nontender, nondistended, soft, positive bowel sounds.  Colostomy with greenish/black stool noted.  Positive bowel sounds.  Central nervous system: Alert and oriented. No focal neurological deficits. Extremities: Symmetric 5 x 5 power. Skin: No rashes, lesions or ulcers Psychiatry: Judgement and insight appear normal. Mood & affect appropriate.     Data Reviewed: I have personally reviewed following labs and imaging studies  CBC: Recent Labs  Lab 12/16/17 0333 12/18/17 1311 12/19/17 0414 12/19/17 0830 12/19/17 1355 12/19/17 1725 12/20/17 0732  WBC 4.9 4.1 5.8  --  6.6  --  7.0  NEUTROABS  --  2.7  --   --   --   --   --   HGB 13.0 11.1* 7.7* 7.0* 6.9* 7.7* 9.5*  HCT 41.9  35.8* 25.0* 22.2* 20.9* 23.5* 29.8*  MCV 92.9 92.7 91.2  --  90.1  --  91.7  PLT 229 192 195  --  179  179  --  733   Basic Metabolic Panel: Recent Labs  Lab 12/15/17 0324 12/16/17 0333 12/17/17 0326 12/18/17 0406 12/19/17 0414 12/20/17 0732  NA 141 140 141 143 143 145  K 4.6 4.6 3.7 4.0 4.0 3.9  CL 105 104 103 102 107 109  CO2 _0 33* 28 33*  GLUCOSE 98 104* 127* 171* 157* 117*  BUN 22* _1 38* 26*  CREATININE 1.11 0.97 0.93 0.87 1.02 0.96  CALCIUM 8.2* 8.0* 8.0* 8.0* 7.8* 7.6*  MG 2.0  --   --   --  1.5* 2.3   GFR: Estimated Creatinine Clearance: 83.1 mL/min (by C-G formula based on SCr of 0.96 mg/dL). Liver Function Tests: No results for input(s): AST, ALT, ALKPHOS, BILITOT, PROT, ALBUMIN in the last 168 hours. No results for input(s): LIPASE, AMYLASE in the last 168 hours. No results for input(s): AMMONIA in the last 168 hours. Coagulation Profile: Recent Labs  Lab 12/19/17 1355  INR 1.27   Cardiac Enzymes: No results for input(s): CKTOTAL, CKMB, CKMBINDEX, TROPONINI in the last 168 hours. BNP (last 3 results) No results for input(s): PROBNP in the last 8760 hours. HbA1C: Recent Labs    12/19/17 0434  HGBA1C 6.4*   CBG: Recent Labs  Lab 12/18/17 2048 12/19/17 0814 12/19/17 1628 12/19/17 2104 12/20/17 0732  GLUCAP 133* 146* 106* 117* 109*   Lipid Profile: No results for input(s): CHOL, HDL, LDLCALC, TRIG, CHOLHDL, LDLDIRECT in the last 72 hours. Thyroid Function Tests: No results for input(s): TSH, T4TOTAL, FREET4, T3FREE, THYROIDAB in the last 72 hours. Anemia Panel: Recent Labs    12/19/17 0830  VITAMINB12 127*  FOLATE 5.8*  FERRITIN 62  TIBC 168*  IRON 68   Sepsis Labs: No results for input(s): PROCALCITON, LATICACIDVEN in the last 168 hours.  Recent Results (from the past 240 hour(s))  MRSA PCR Screening     Status: None   Collection Time: 12/13/17 12:00 PM  Result Value Ref Range Status   MRSA by PCR NEGATIVE NEGATIVE  Final    Comment:        The GeneXpert MRSA Assay (FDA approved for NASAL specimens only), is one component of a comprehensive MRSA colonization surveillance program. It is not intended to diagnose MRSA infection nor to guide or monitor treatment for MRSA infections. Performed at [facility], [place] [address], [place], [place] [address]          Radiology Studies: Dg Chest [place] 1  View  Result Date: 12/19/2017 CLINICAL DATA:  Decreased O2 sats EXAM: PORTABLE CHEST 1 VIEW COMPARISON:  03/28/2016 FINDINGS: Mild cardiomegaly. Linear bibasilar atelectasis or scarring, similar to prior study, favor scarring. No effusions or acute bony abnormality. IMPRESSION: Chronic bibasilar densities, likely scarring.  Mild cardiomegaly. Electronically Signed   By: Rolm Baptise M.D.   On: 12/19/2017 09:35        Scheduled Meds: . chlorhexidine  15 mL Mouth Rinse BID  . cyanocobalamin  1,000 mcg Intramuscular Daily  . diltiazem  120 mg Oral Daily  . doxazosin  8 mg Oral Daily  . fluconazole  100 mg Oral Daily  . furosemide  20 mg Intravenous Once  . insulin aspart  0-9 Units Subcutaneous TID WC  . mouth rinse  15 mL Mouth Rinse q12n4p  . metoprolol tartrate  25 mg Oral BID  . pravastatin  20 mg Oral Daily   Continuous Infusions: . lactated ringers 75 mL/hr at 12/20/17 0410  . methocarbamol (ROBAXIN)  IV    . pantoprozole (PROTONIX) infusion 8 mg/hr (12/20/17 0943)     LOS: 7 days    Time spent: 40 minutes    Irine Seal, MD [facility] Pager [phone]  If 7PM-7AM, please contact night-coverage [email or web address] Password [email or web address] 12/20/2017, 10:53 AM

## 2017-12-20 NOTE — Progress Notes (Signed)
PT Cancellation Note  Patient Details Name: Manuel [name] MRN: [id number] DOB: 18-May-1946   Cancelled Treatment:    Reason Eval/Treat Not Completed: Medical issues which prohibited therapy; pt with continued melenic stools, to go for bleeding scan, defer at this time and continue efforts;   [facility] 12/20/2017, 9:19 AM

## 2017-12-21 DIAGNOSIS — K259 Gastric ulcer, unspecified as acute or chronic, without hemorrhage or perforation: Secondary | ICD-10-CM

## 2017-12-21 DIAGNOSIS — K269 Duodenal ulcer, unspecified as acute or chronic, without hemorrhage or perforation: Secondary | ICD-10-CM

## 2017-12-21 DIAGNOSIS — D62 Acute posthemorrhagic anemia: Secondary | ICD-10-CM

## 2017-12-21 LAB — BASIC METABOLIC PANEL
ANION GAP: 6 (ref 5–15)
BUN: 18 mg/dL (ref 6–20)
CHLORIDE: 110 mmol/L (ref 101–111)
CO2: 31 mmol/L (ref 22–32)
Calcium: 7.9 mg/dL — ABNORMAL LOW (ref 8.9–10.3)
Creatinine, Ser: 1.01 mg/dL (ref 0.61–1.24)
GFR calc Af Amer: >60 mL/min (ref >60)
GFR calc non Af Amer: >60 mL/min (ref >60)
GLUCOSE: 95 mg/dL (ref 65–99)
POTASSIUM: 3.3 mmol/L — AB (ref 3.5–5.1)
Sodium: 147 mmol/L — ABNORMAL HIGH (ref 135–145)

## 2017-12-21 LAB — CBC WITH DIFFERENTIAL/PLATELET
BASOS ABS: 0 10*3/uL (ref 0.0–0.1)
Basophils Relative: 0 %
Eosinophils Absolute: 0.1 10*3/uL (ref 0.0–0.7)
Eosinophils Relative: 1 %
HEMATOCRIT: 26.3 % — AB (ref 39.0–52.0)
HEMOGLOBIN: 8.7 g/dL — AB (ref 13.0–17.0)
LYMPHS PCT: 14 %
Lymphs Abs: 0.8 10*3/uL (ref 0.7–4.0)
MCH: 30.3 pg (ref 26.0–34.0)
MCHC: 33.1 g/dL (ref 30.0–36.0)
MCV: 91.6 fL (ref 78.0–100.0)
MONO ABS: 0.3 10*3/uL (ref 0.1–1.0)
Monocytes Relative: 6 %
NEUTROS ABS: 4.7 10*3/uL (ref 1.7–7.7)
NEUTROS PCT: 79 %
Platelets: 169 10*3/uL (ref 150–400)
RBC: 2.87 MIL/uL — AB (ref 4.22–5.81)
RDW: 16 % — AB (ref 11.5–15.5)
WBC: 5.9 10*3/uL (ref 4.0–10.5)

## 2017-12-21 LAB — GLUCOSE, CAPILLARY
GLUCOSE-CAPILLARY: 138 mg/dL — AB (ref 65–99)
Glucose-Capillary: 82 mg/dL (ref 65–99)
Glucose-Capillary: 75 mg/dL (ref 65–99)
Glucose-Capillary: 156 mg/dL — ABNORMAL HIGH (ref 65–99)

## 2017-12-21 LAB — HEMOGLOBIN AND HEMATOCRIT, BLOOD
HEMATOCRIT: 27.6 % — AB (ref 39.0–52.0)
HEMOGLOBIN: 8.9 g/dL — AB (ref 13.0–17.0)

## 2017-12-21 MED ORDER — SODIUM CHLORIDE 0.45 % IV SOLN
INTRAVENOUS | Status: DC
  Filled 2017-12-21: qty 1000

## 2017-12-21 MED ORDER — SODIUM CHLORIDE 0.45 % IV SOLN
INTRAVENOUS | Status: DC
  Administered 2017-12-21: 75 mL/h via INTRAVENOUS

## 2017-12-21 MED ORDER — LEVALBUTEROL HCL 0.63 MG/3ML IN NEBU: 0.6300 mg | INHALATION_SOLUTION | Freq: Two times a day (BID) | RESPIRATORY_TRACT | Status: DC

## 2017-12-21 MED ORDER — ACETAMINOPHEN 325 MG PO TABS
650.0000 mg | ORAL_TABLET | Freq: Once | ORAL | Status: AC
  Administered 2017-12-21: 650 mg via ORAL
  Filled 2017-12-21: qty 2

## 2017-12-21 MED ORDER — SODIUM CHLORIDE 0.45 % IV SOLN
INTRAVENOUS | Status: DC
  Administered 2017-12-21: 11:00:00 via INTRAVENOUS

## 2017-12-21 MED ORDER — POTASSIUM CHLORIDE CRYS ER 20 MEQ PO TBCR
40.0000 meq | EXTENDED_RELEASE_TABLET | Freq: Once | ORAL | Status: AC
  Administered 2017-12-21: 40 meq via ORAL
  Filled 2017-12-21: qty 2

## 2017-12-21 MED ORDER — IPRATROPIUM BROMIDE 0.02 % IN SOLN: 0.5000 mg | Freq: Two times a day (BID) | RESPIRATORY_TRACT | Status: DC

## 2017-12-21 NOTE — Progress Notes (Signed)
PROGRESS NOTE    KNOXX BOEDING  RKY:[id number] DOB: Mar 01, 1946 DOA: 12/12/2017 PCP: Jani Gravel, MD   Brief Narrative:  Atri Fib, OSA, DM, HTN, who presented to the Ed at [facility] with complaints of constipation that started a ~week ago, Thursday, but then he was able to have a small bowel movement ~5 days ago, none since. Patient underwent flexible sigmoidoscopy which found that patient had a diverticular stricture.  CT scan already showed patient had pneumatosis and surgery to the patient to OR for Hartman's procedure with colostomy and colectomy. Currently further plan is monitor telemetry. Patient noted to have an acute GI bleed as blood noted coming out of colostomy.  Hemoglobin dropped significantly from 11-7.0 over a 24 hour period.  Patient typed and cross and to be transfused 4 units packed red blood cells on 12/19/2017.  GI reassessed patient he underwent upper endoscopy 12/19/2017 that showed non-bleeding gastric ulcers and plaques in the esophagus concerning for candidiasis.      Assessment & Plan:   Principal Problem:   Colonic stricture (HCC) Active Problems:   Acute GI bleeding   Atrial fibrillation (HCC)   Acute posthemorrhagic anemia   Gastric ulcer without hemorrhage or perforation   Duodenal ulcer   BPH (benign prostatic hyperplasia)   DM (diabetes mellitus) (HCC)   HTN (hypertension)   OSA (obstructive sleep apnea)   Hypoxia   Low oxygen saturation   Esophageal candidiasis (HCC)   Melena   Vitamin B12 deficiency  #1 colonic stricture/abdominal pain with severe constipation Patient had presented with significant abdominal pain abdominal distention noted to be significantly constipated.  Patient underwent a flex sigmoidoscopy that showed a colonic stricture.  Patient seen in consultation by general surgery patient status post diagnostic laparoscopy, Hartman's procedure with end colostomy per Dr. Marcello Moores 12/13/2017.  Colostomy bag with dark stool.  Patient received IV  antibiotics through 12/16/2017 and per general surgery no further antibiotics needed at this time.  Patient now with probable acute upper GI bleed as patient noted to have melanotic stools in colostomy bag as well as bright red blood noted out of colostomy.  Eating seems to be slowing down.  Patient on IV Protonix.  General surgery following.  Will need home health RN on discharge.   2.  A. fib with RVR/hypotension Intraoperatively patient developed A. fib with RVR with hypotension.  Blood pressure improved.  Heart rate in the 150s on ambulation with PT on 12/18/2017.  Heart rates at rest ranging from 90 to the 120s.  Patient noted initially to have a 2.2-second pause on telemetry after he received a dose of Cardizem 180 mg.  Patient noted to have heart rates in the 150s early morning on 12/19/2017, however patient noted to be somewhat hypotensive and also in the acute GI bleed.  Heart rate improved with transfusion of packed red blood cells and current regimen of Lopressor 25 mg twice daily as well as current dose of Cardizem.  Eliquis has been discontinued secondary to acute GI bleed.  Rate control was discussed with Dr. Einar Gip on 12/18/2017.  If rate becomes difficult to control will formally consult with cardiology.   3.  Acute GI bleed likely upper GI bleed/symptomatic anemia/acute blood loss anemia/gastric ulcers/duodenal ulcers Patient noted to have melanotic stool out of colostomy bag on 5/29 - 12/19/2017.  Patient also noted to have bright red blood out of colostomy bag overnight per RN note.  Hemoglobin at 8.7 from 9.1 from 9.5 from 7.7 from 11.1 on  12/18/2017.  Patient status post 4 units packed red blood cells on 12/19/2017.  Gastroenterology was reconsulted and patient reassessed by Dr. Benson Norway 12/19/2017 and patient underwent upper endoscopy that showed nonbleeding gastric ulcers as well as plaques in the esophagus concerning for candidiasis.  Patient noted to have dark greenish output from colostomy.  Patient currently n.p.o. and on the bowel rest.  Continue Protonix drip.  Per GI if further bleeding may need a bleeding scan.  Defer advancement of diet to GI.  GI following and appreciate their input and recommendations.  4.  Esophageal candidiasis Per Upper endoscopy on 12/19/2017.  Continue Diflucan.  5.  Hypertension Patient noted to be hypotensive the morning of 12/19/2017.  Secondary to acute blood loss.  Some improvement with blood pressure however still borderline.  Status post 4 units packed red blood cells 12/19/2017.  Gentle hydration with IV fluids x24 hours.  Monitor closely while on Lopressor and Cardizem.   6.  BPH Foley catheter initially placed on 12/16/2017.  Foley catheter has been removed.  Urine output of 1.050 L over the past 24 hours.  Continue doxazosin.   7.  Diabetes mellitus type 2 CBGs 82 this morning.  Continue to hold oral hypoglycemic agents.  Continue sliding scale insulin.    8.  Acute hypoxic respiratory failure Likely secondary to postop atelectasis.  Patient noted to be hypoxic the morning of 12/19/2017 and placed on O2.  Improvement with hypoxia.  New O2 requirements likely secondary to symptomatic anemia/acute GI bleed.  Expiratory wheezing improved with scheduled nebulizer treatment.  Continue scheduled Xopenex and Atrovent nebs.  Status post 4 units packed red blood cells.    9 tobacco abuse Tobacco cessation.  Continue scheduled nebs.    10.  Vitamin B12 deficiency Continue vitamin B12 IM 1000 MCG's daily x6 days, and then weekly x1 month, and then monthly.  Will need outpatient follow-up.   DVT prophylaxis: SCD Code Status: Full Family Communication: Updated patient and wife at bedside. Disposition Plan: Remain in stepdown unit.  Home once acute GI bleed has resolved, hemoglobin stabilized and heart rate better controlled and per GI.   Consultants:   General surgery: Dr. Marlou Starks 12/12/2017  [place] RN Cristy Hilts, RN 12/15/2017  Gastroenterology:  Dr. Benson Norway 12/13/2017  Procedures:   CT abdomen and pelvis 12/11/2017  Flexible sigmoidoscopy 12/13/2017--Per Dr. Benson Norway  Diagnostic laparoscopy, Hartman's procedure with end colostomy per Dr. Marcello Moores 12/13/2017  4 units packed red blood cells 12/19/2017  Upper endoscopy 12/19/2017 per Dr. Benson Norway  Antimicrobials:   IV Rocephin 12/13/2017>>>>> 12/16/2017     Subjective: Patient laying in bed.  States he was sitting in the chair.  Hungry.  Asking for food.  Denies any chest pain.  Denies any shortness of breath.    Objective: Vitals:   12/21/17 0700 12/21/17 0800 12/21/17 0900 12/21/17 1000  BP: 99/65 (!) 100/58 104/65 (!) 120/56  Pulse: (!) 106 84 78   Resp: _0 Temp:  98 F (36.7 C)    TempSrc:  Oral    SpO2: 100% 99% 94%   Weight:      Height:        Intake/Output Summary (Last 24 hours) at 12/21/2017 1113 Last data filed at 12/21/2017 1000 Gross per 24 hour  Intake 1975 ml  Output 2300 ml  Net -325 ml   Filed Weights   12/13/17 0850 12/20/17 0430 12/21/17 0347  Weight: 100.7 kg (222 lb) 101 kg (222 lb 10.6 oz) 101.5 kg (  223 lb 12.3 oz)    Examination:  General exam: NAD Respiratory system: Lungs clear to auscultation bilaterally.  No wheezes, no crackles, no rhonchi.  Respiratory effort normal. Cardiovascular system: Irregularly irregular.  No JVD, murmurs rubs or gallops.  No pedal edema.  Gastrointestinal system: Abdomen is soft, nontender, nondistended, positive bowel sounds.  Colostomy now with greenish dark stool.  Central nervous system: Alert and oriented. No focal neurological deficits. Extremities: Symmetric 5 x 5 power. Skin: No rashes, lesions or ulcers Psychiatry: Judgement and insight appear normal. Mood & affect appropriate.     Data Reviewed: I have personally reviewed following labs and imaging studies  CBC: Recent Labs  Lab 12/18/17 1311 12/19/17 0414  12/19/17 1355 12/19/17 1725 12/20/17 0732 12/20/17 1609 12/21/17 0322  WBC 4.1 5.8   --  6.6  --  7.0  --  5.9  NEUTROABS 2.7  --   --   --   --   --   --  4.7  HGB 11.1* 7.7*   < > 6.9* 7.7* 9.5* 9.1* 8.7*  HCT 35.8* 25.0*   < > 20.9* 23.5* 29.8* 28.5* 26.3*  MCV 92.7 91.2  --  90.1  --  91.7  --  91.6  PLT 192 195  --  179  179  --  157  --  169   < > = values in this interval not displayed.   Basic Metabolic Panel: Recent Labs  Lab 12/15/17 0324  12/17/17 0326 12/18/17 0406 12/19/17 0414 12/20/17 0732 12/21/17 0322  NA 141   < > 141 143 143 145 147*  K 4.6   < > 3.7 4.0 4.0 3.9 3.3*  CL 105   < > 103 102 107 109 110  CO2 27   < > 31 33* 28 33* 31  GLUCOSE 98   < > 127* 171* 157* 117* 95  BUN 22*   < > 13 19 38* 26* 18  CREATININE 1.11   < > 0.93 0.87 1.02 0.96 1.01  CALCIUM 8.2*   < > 8.0* 8.0* 7.8* 7.6* 7.9*  MG 2.0  --   --   --  1.5* 2.3  --    < > = values in this interval not displayed.   GFR: Estimated Creatinine Clearance: 85.4 mL/min (by C-G formula based on SCr of 1.01 mg/dL). Liver Function Tests: No results for input(s): AST, ALT, ALKPHOS, BILITOT, PROT, ALBUMIN in the last 168 hours. No results for input(s): LIPASE, AMYLASE in the last 168 hours. No results for input(s): AMMONIA in the last 168 hours. Coagulation Profile: Recent Labs  Lab 12/19/17 1355  INR 1.27   Cardiac Enzymes: No results for input(s): CKTOTAL, CKMB, CKMBINDEX, TROPONINI in the last 168 hours. BNP (last 3 results) No results for input(s): PROBNP in the last 8760 hours. HbA1C: Recent Labs    12/19/17 0434  HGBA1C 6.4*   CBG: Recent Labs  Lab 12/20/17 1152 12/20/17 1540 12/20/17 1631 12/20/17 2114 12/21/17 0735  GLUCAP 89 96 95 80 82   Lipid Profile: No results for input(s): CHOL, HDL, LDLCALC, TRIG, CHOLHDL, LDLDIRECT in the last 72 hours. Thyroid Function Tests: No results for input(s): TSH, T4TOTAL, FREET4, T3FREE, THYROIDAB in the last 72 hours. Anemia Panel: Recent Labs    12/19/17 0830  VITAMINB12 127*  FOLATE 5.8*  FERRITIN 62  TIBC 168*    IRON 68   Sepsis Labs: No results for input(s): PROCALCITON, LATICACIDVEN in the last 168 hours.  Recent  Results (from the past 240 hour(s))  MRSA PCR Screening     Status: None   Collection Time: 12/13/17 12:00 PM  Result Value Ref Range Status   MRSA by PCR NEGATIVE NEGATIVE Final    Comment:        The GeneXpert MRSA Assay (FDA approved for NASAL specimens only), is one component of a comprehensive MRSA colonization surveillance program. It is not intended to diagnose MRSA infection nor to guide or monitor treatment for MRSA infections. Performed at [facility], [place] [address] [place], [place] [address]          Radiology Studies: No results found.      Scheduled Meds: . chlorhexidine  15 mL Mouth Rinse BID  . cyanocobalamin  1,000 mcg Intramuscular Daily  . diltiazem  120 mg Oral Daily  . doxazosin  8 mg Oral Daily  . fluconazole  100 mg Oral Daily  . furosemide  20 mg Intravenous Once  . insulin aspart  0-9 Units Subcutaneous TID WC  . mouth rinse  15 mL Mouth Rinse q12n4p  . metoprolol tartrate  25 mg Oral BID  . pravastatin  20 mg Oral Daily   Continuous Infusions: . sodium chloride 100 mL/hr at 12/21/17 1037  . methocarbamol (ROBAXIN)  IV    . pantoprozole (PROTONIX) infusion 8 mg/hr (12/21/17 0600)     LOS: 8 days    Time spent: 40 minutes    Irine Seal, MD [facility] Pager [phone]  If 7PM-7AM, please contact night-coverage [email or web address] Password [email or web address] 12/21/2017, 11:13 AM

## 2017-12-21 NOTE — Progress Notes (Addendum)
Patient ID: Manuel [name], male   DOB: Oct 18, 1945, 72 y.o.   MRN: [id number]    Progress Note   Subjective  Doing well , no complaints - hungry. Stool is green brown in ostomy  Hgb  8.7 post transfusions    Objective   Vital signs in last 24 hours: Temp:  [98 F (36.7 C)-98.7 F (37.1 C)] 98 F (36.7 C) (06/01 0800) Pulse Rate:  [72-106] 78 (06/01 0900) Resp:  [9-20] 20 (06/01 1000) BP: (92-120)/(51-65) 120/56 (06/01 1000) SpO2:  [88 %-100 %] 94 % (06/01 0900) Weight:  [223 lb 12.3 oz (101.5 kg)] 223 lb 12.3 oz (101.5 kg) (06/01 0347) Last BM Date: 12/20/17 General:   AA male in NAD Heart:  Regular rate and rhythm; no murmurs Lungs: Respirations even and unlabored, lungs CTA bilaterally Abdomen:  Soft, nontender and nondistended. Normal bowel sounds.Ostomy in LMQ - liquid green brown stool Extremities:  Without edema. Neurologic:  Alert and oriented,  grossly normal neurologically. Psych:  Cooperative. Normal mood and affect.  Intake/Output from previous day: 05/31 0701 - 06/01 0700 In: 2437.5 [I.V.:2437.5] Out: 2450 [Urine:1050; IEPPI:9518] Intake/Output this shift: Total I/O In: -  Out: 350 [Urine:200; Stool:150]  Lab Results: Recent Labs    12/19/17 1355  12/20/17 0732 12/20/17 1609 12/21/17 0322  WBC 6.6  --  7.0  --  5.9  HGB 6.9*   < > 9.5* 9.1* 8.7*  HCT 20.9*   < > 29.8* 28.5* 26.3*  PLT 179  179  --  157  --  169   < > = values in this interval not displayed.   BMET Recent Labs    12/19/17 0414 12/20/17 0732 12/21/17 0322  NA 143 145 147*  K 4.0 3.9 3.3*  CL 107 109 110  CO2 28 33* 31  GLUCOSE 157* 117* 95  BUN 38* 26* 18  CREATININE 1.02 0.96 1.01  CALCIUM 7.8* 7.6* 7.9*   LFT No results for input(s): PROT, ALBUMIN, AST, ALT, ALKPHOS, BILITOT, BILIDIR, IBILI in the last 72 hours. PT/INR Recent Labs    12/19/17 1355  LABPROT 15.7*  INR 1.27       Assessment / Plan:    #1 72 yo male with acute GI bleed secondary to gastric and  duodenal ulcers /clean based at EGD - had been on Eliquis  No further Active bleeding  H pylori pending On Protonix infusion - will leave him on this for 72 hrs, then can convert to PO BID (had already been on x 48 hrs and hard to get IV protonix -so complete 72 hrs) Start Full liquids  Serial HGb's -   Hold Eliquis ? Per primary service   #2 S/p resection of sigmoid stricture- colostomy -POD # 8     Contact  Amy Esterwood, P.A.-C               [phone]      Principal Problem:   Colonic stricture ([place]) Active Problems:   Atrial fibrillation (HCC)   BPH (benign prostatic hyperplasia)   DM (diabetes mellitus) (HCC)   HTN (hypertension)   OSA (obstructive sleep apnea)   Acute posthemorrhagic anemia   Acute GI bleeding   Hypoxia   Low oxygen saturation   Esophageal candidiasis (HCC)   Gastric ulcer without hemorrhage or perforation   Duodenal ulcer   Melena   Vitamin B12 deficiency     LOS: 8 days   Amy Esterwood  12/21/2017, 10:57 AM   GI  ATTENDING - covering for Dr. Benson Norway  Case discussed with Dr. Benson Norway. Interval history data reviewed. Patient personally seen and examined. Wife in room. Agree with above progress note as outlined. No further bleeding. Normal colored ostomy contents. Abdomen is benign. Transient upper GI bleed secondary to peptic ulcer disease. Agree with continuing PPI therapy. After transitioning off IV PPI therapy he will need twice a day PPI for 8 weeks, then once daily thereafter. Minimize unnecessary NSAIDs. We discussed this. Treat Helicobacter pylori if positive (pending). In terms of resumption of Eliquis, this should be based on risk stratification. If high risk, resume sooner rather than later. I will leave this to the supervising physician. GI follow-up as needed with Dr. Benson Norway. Will sign off.  [name], M.D. [facility] Division of Gastroenterology

## 2017-12-22 LAB — BASIC METABOLIC PANEL
ANION GAP: 8 (ref 5–15)
Anion gap: 8 (ref 5–15)
BUN: 12 mg/dL (ref 6–20)
BUN: 10 mg/dL (ref 6–20)
CHLORIDE: 110 mmol/L (ref 101–111)
CO2: 28 mmol/L (ref 22–32)
CO2: 27 mmol/L (ref 22–32)
Calcium: 7.8 mg/dL — ABNORMAL LOW (ref 8.9–10.3)
Calcium: 7.8 mg/dL — ABNORMAL LOW (ref 8.9–10.3)
Chloride: 110 mmol/L (ref 101–111)
Creatinine, Ser: 1.02 mg/dL (ref 0.61–1.24)
Creatinine, Ser: 1.05 mg/dL (ref 0.61–1.24)
GFR calc Af Amer: >60 mL/min (ref >60)
GFR calc non Af Amer: >60 mL/min (ref >60)
GLUCOSE: 107 mg/dL — AB (ref 65–99)
GLUCOSE: 127 mg/dL — AB (ref 65–99)
POTASSIUM: 2.9 mmol/L — AB (ref 3.5–5.1)
POTASSIUM: 3.2 mmol/L — AB (ref 3.5–5.1)
Sodium: 146 mmol/L — ABNORMAL HIGH (ref 135–145)
Sodium: 145 mmol/L (ref 135–145)

## 2017-12-22 LAB — CBC
HEMATOCRIT: 26.9 % — AB (ref 39.0–52.0)
HEMOGLOBIN: 8.6 g/dL — AB (ref 13.0–17.0)
MCH: 29.7 pg (ref 26.0–34.0)
MCHC: 32 g/dL (ref 30.0–36.0)
MCV: 92.8 fL (ref 78.0–100.0)
Platelets: 186 10*3/uL (ref 150–400)
RBC: 2.9 MIL/uL — ABNORMAL LOW (ref 4.22–5.81)
RDW: 15.1 % (ref 11.5–15.5)
WBC: 7.1 10*3/uL (ref 4.0–10.5)

## 2017-12-22 LAB — GLUCOSE, CAPILLARY
GLUCOSE-CAPILLARY: 154 mg/dL — AB (ref 65–99)
Glucose-Capillary: 111 mg/dL — ABNORMAL HIGH (ref 65–99)
Glucose-Capillary: 149 mg/dL — ABNORMAL HIGH (ref 65–99)

## 2017-12-22 LAB — MAGNESIUM: Magnesium: 1.8 mg/dL (ref 1.7–2.4)

## 2017-12-22 MED ORDER — POTASSIUM CHLORIDE CRYS ER 20 MEQ PO TBCR
40.0000 meq | EXTENDED_RELEASE_TABLET | Freq: Once | ORAL | Status: AC
  Administered 2017-12-22: 40 meq via ORAL
  Filled 2017-12-22: qty 2

## 2017-12-22 MED ORDER — MOMETASONE FURO-FORMOTEROL FUM 200-5 MCG/ACT IN AERO
2.0000 | INHALATION_SPRAY | Freq: Two times a day (BID) | RESPIRATORY_TRACT | Status: DC
  Administered 2017-12-22 – 2017-12-23 (×2): 2 via RESPIRATORY_TRACT
  Filled 2017-12-22: qty 8.8

## 2017-12-22 MED ORDER — POTASSIUM CHLORIDE CRYS ER 20 MEQ PO TBCR
40.0000 meq | EXTENDED_RELEASE_TABLET | ORAL | Status: AC
  Administered 2017-12-22 – 2017-12-23 (×2): 40 meq via ORAL
  Filled 2017-12-22 (×2): qty 2

## 2017-12-22 MED ORDER — PANTOPRAZOLE SODIUM 40 MG PO TBEC
40.0000 mg | DELAYED_RELEASE_TABLET | Freq: Two times a day (BID) | ORAL | Status: DC
  Administered 2017-12-22 – 2017-12-23 (×2): 40 mg via ORAL
  Filled 2017-12-22 (×2): qty 1

## 2017-12-22 MED ORDER — POTASSIUM CHLORIDE CRYS ER 20 MEQ PO TBCR
40.0000 meq | EXTENDED_RELEASE_TABLET | Freq: Two times a day (BID) | ORAL | Status: DC
  Administered 2017-12-22: 40 meq via ORAL
  Filled 2017-12-22: qty 2

## 2017-12-22 NOTE — Progress Notes (Signed)
PROGRESS NOTE    Manuel [name]  PQD:[id number] DOB: Apr 20, 1946 DOA: 12/12/2017 PCP: Jani Gravel, MD   Brief Narrative:  Atri Fib, OSA, DM, HTN, who presented to the Ed at [facility] with complaints of constipation that started a ~week ago, Thursday, but then he was able to have a small bowel movement ~5 days ago, none since. Patient underwent flexible sigmoidoscopy which found that patient had a diverticular stricture.  CT scan already showed patient had pneumatosis and surgery to the patient to OR for Hartman's procedure with colostomy and colectomy. Currently further plan is monitor telemetry. Patient noted to have an acute GI bleed as blood noted coming out of colostomy.  Hemoglobin dropped significantly from 11-7.0 over a 24 hour period.  Patient typed and cross and to be transfused 4 units packed red blood cells on 12/19/2017.  GI reassessed patient he underwent upper endoscopy 12/19/2017 that showed non-bleeding gastric ulcers and plaques in the esophagus concerning for candidiasis.      Assessment & Plan:   Principal Problem:   Colonic stricture (HCC) Active Problems:   Acute GI bleeding   Atrial fibrillation (HCC)   Acute posthemorrhagic anemia   Gastric ulcer without hemorrhage or perforation   Duodenal ulcer   BPH (benign prostatic hyperplasia)   DM (diabetes mellitus) (HCC)   HTN (hypertension)   OSA (obstructive sleep apnea)   Hypoxia   Low oxygen saturation   Esophageal candidiasis (HCC)   Melena   Vitamin B12 deficiency  #1 colonic stricture/abdominal pain with severe constipation Patient had presented with significant abdominal pain abdominal distention noted to be significantly constipated.  Patient underwent a flex sigmoidoscopy that showed a colonic stricture.  Patient seen in consultation by general surgery patient status post diagnostic laparoscopy, Hartman's procedure with end colostomy per Dr. Marcello Moores 12/13/2017.  Colostomy bag with brown stool. Patient received IV  antibiotics through 12/16/2017 and per general surgery no further antibiotics needed at this time.  Patient now with probable acute upper GI bleed as patient noted to have melanotic stools in colostomy bag as well as bright red blood noted out of colostomy.  Bleeding seems to have slowed down.  Colostomy bag now with brown stool.  Hemoglobin stable.  Patient tolerating full liquid diet.  Continue PPI.  Will advance to a soft diet.  General surgery following.  Will need home health RN on discharge.   2.  A. fib with RVR/hypotension Intraoperatively patient developed A. fib with RVR with hypotension.  Blood pressure improved.  Heart rate in the 150s on ambulation with PT on 12/18/2017.  Heart rates at rest ranging from 90 to the 120s.  Patient noted initially to have a 2.2-second pause on telemetry after he received a dose of Cardizem 180 mg.  Patient noted to have heart rates in the 150s early morning on 12/19/2017, however patient noted to be somewhat hypotensive and also in the acute GI bleed.  Heart rate improved with transfusion of packed red blood cells and current regimen of Lopressor 25 mg twice daily as well as current dose of Cardizem.  Eliquis has been discontinued secondary to acute GI bleed.  Rate control was discussed with Dr. Einar Gip on 12/18/2017.  Could likely resume anticoagulation in 2 weeks or on  follow-up with PCP.  3.  Acute GI bleed likely upper GI bleed/symptomatic anemia/acute blood loss anemia/gastric ulcers/duodenal ulcers Patient noted to have melanotic stool out of colostomy bag on 5/29 - 12/19/2017.  Patient also noted to have bright red blood  out of colostomy bag overnight per RN note.  Hemoglobin at 8.6 from 8.9 from 8.7 from 9.1 from 9.5 from 7.7 from 11.1 on 12/18/2017.  Patient status post 4 units packed red blood cells on 12/19/2017.  Gastroenterology was reconsulted and patient reassessed by Dr. Benson Norway 12/19/2017 and patient underwent upper endoscopy that showed nonbleeding gastric  ulcers as well as plaques in the esophagus concerning for candidiasis.  H pylori was negative on biopsies.  Biopsies negative for malignancy and consistent with chronic gastritis and peptic duodenitis.  Patient noted to have dark greenish output from colostomy. Patient currently tolerating a full liquid diet.  Output from colostomy has brown stool.  Protonix drip with IV and 72 hours today will subsequently transition to oral Protonix twice daily for 8 weeks and then daily per GI recommendations.  Will need outpatient follow-up with GI.    4.  Esophageal candidiasis Per Upper endoscopy on 12/19/2017.  Continue Diflucan.  5.  Hypertension Patient noted to be hypotensive the morning of 12/19/2017.  Secondary to acute blood loss.  Blood pressure improved.  Status post 4 units packed red blood cells 12/19/2017.  Saline lock IV fluids.  Continue current regimen of Lopressor and Cardizem.   6.  BPH Foley catheter initially placed on 12/16/2017.  Foley catheter has been removed.  Urine output of 725 cc recorded over the past 24 hours.  Continue doxazosin.   7.  Diabetes mellitus type 2 CBGs 111 this morning.  Continue to hold oral hypoglycemic agents.  Continue sliding scale insulin.    8.  Acute hypoxic respiratory failure Likely secondary to postop atelectasis.  Patient noted to be hypoxic the morning of 12/19/2017 and placed on O2.  Improvement with hypoxia.  New O2 requirements likely secondary to symptomatic anemia/acute GI bleed.  O2 requirements have improved.  Expiratory wheezing improved with scheduled nebulizer treatment.  Continue scheduled Xopenex and Atrovent nebs.  Add Symbicort.  Status post 4 units packed red blood cells.    9 tobacco abuse Tobacco cessation.  Continue scheduled nebs.  Will add Symbicort.  10.  Vitamin B12 deficiency Continue vitamin B12 IM 1000 MCG's daily x5 days, and then weekly x1 month, and then monthly.  Will need outpatient follow-up.   DVT prophylaxis: SCD Code  Status: Full Family Communication: Updated patient and wife at bedside. Disposition Plan: Transfer to telemetry.  Hopefully home in the next 24 to 48 hours.    Consultants:   General surgery: Dr. Marlou Starks 12/12/2017  [place] RN Cristy Hilts, RN 12/15/2017  Gastroenterology: Dr. Benson Norway 12/13/2017  Procedures:   CT abdomen and pelvis 12/11/2017  Flexible sigmoidoscopy 12/13/2017--Per Dr. Benson Norway  Diagnostic laparoscopy, Hartman's procedure with end colostomy per Dr. Marcello Moores 12/13/2017  4 units packed red blood cells 12/19/2017  Upper endoscopy 12/19/2017 per Dr. Benson Norway  Antimicrobials:   IV Rocephin 12/13/2017>>>>> 12/16/2017     Subjective: Patient feeling better.  No further melanotic stools noted in colostomy.  Denies any chest pain.  No shortness of breath.  Hungry.   Objective: Vitals:   12/22/17 0617 12/22/17 0800 12/22/17 0900 12/22/17 1000  BP:   (!) 117/55   Pulse:  87 81 95  Resp:  (!) _0 Temp:  98.1 F (36.7 C)    TempSrc:  Oral    SpO2:  92% 95% 93%  Weight: 101.7 kg (224 lb 3.3 oz)     Height:        Intake/Output Summary (Last 24 hours) at 12/22/2017 1054 Last  data filed at 12/22/2017 0800 Gross per 24 hour  Intake 1805 ml  Output 1900 ml  Net -95 ml   Filed Weights   12/20/17 0430 12/21/17 0347 12/22/17 0617  Weight: 101 kg (222 lb 10.6 oz) 101.5 kg (223 lb 12.3 oz) 101.7 kg (224 lb 3.3 oz)    Examination:  General exam: NAD Respiratory system: Minimal expiratory wheezing in the bases.  No rhonchi.  Respiratory effort normal. Cardiovascular system: Irregularly irregular.  No JVD, murmurs rubs or gallops.  No pedal edema.  Gastrointestinal system: Abdomen is nontender, nondistended, soft, positive bowel sounds.  Colostomy with brown stool.  Central nervous system: Alert and oriented. No focal neurological deficits. Extremities: Symmetric 5 x 5 power. Skin: No rashes, lesions or ulcers Psychiatry: Judgement and insight appear normal. Mood & affect  appropriate.     Data Reviewed: I have personally reviewed following labs and imaging studies  CBC: Recent Labs  Lab 12/18/17 1311 12/19/17 0414  12/19/17 1355  12/20/17 0732 12/20/17 1609 12/21/17 0322 12/21/17 1559 12/22/17 0321  WBC 4.1 5.8  --  6.6  --  7.0  --  5.9  --  7.1  NEUTROABS 2.7  --   --   --   --   --   --  4.7  --   --   HGB 11.1* 7.7*   < > 6.9*   < > 9.5* 9.1* 8.7* 8.9* 8.6*  HCT 35.8* 25.0*   < > 20.9*   < > 29.8* 28.5* 26.3* 27.6* 26.9*  MCV 92.7 91.2  --  90.1  --  91.7  --  91.6  --  92.8  PLT 192 195  --  179  179  --  157  --  169  --  186   < > = values in this interval not displayed.   Basic Metabolic Panel: Recent Labs  Lab 12/18/17 0406 12/19/17 0414 12/20/17 0732 12/21/17 0322 12/22/17 0321  NA 143 143 145 147* 146*  K 4.0 4.0 3.9 3.3* 2.9*  CL 102 107 109 110 110  CO2 33* 28 33* 31 28  GLUCOSE 171* 157* 117* 95 107*  BUN 19 38* 26* 18 12  CREATININE 0.87 1.02 0.96 1.01 1.02  CALCIUM 8.0* 7.8* 7.6* 7.9* 7.8*  MG  --  1.5* 2.3  --  1.8   GFR: Estimated Creatinine Clearance: 84.6 mL/min (by C-G formula based on SCr of 1.02 mg/dL). Liver Function Tests: No results for input(s): AST, ALT, ALKPHOS, BILITOT, PROT, ALBUMIN in the last 168 hours. No results for input(s): LIPASE, AMYLASE in the last 168 hours. No results for input(s): AMMONIA in the last 168 hours. Coagulation Profile: Recent Labs  Lab 12/19/17 1355  INR 1.27   Cardiac Enzymes: No results for input(s): CKTOTAL, CKMB, CKMBINDEX, TROPONINI in the last 168 hours. BNP (last 3 results) No results for input(s): PROBNP in the last 8760 hours. HbA1C: No results for input(s): HGBA1C in the last 72 hours. CBG: Recent Labs  Lab 12/21/17 0735 12/21/17 1146 12/21/17 1600 12/21/17 2130 12/22/17 0802  GLUCAP 82 75 156* 138* 111*   Lipid Profile: No results for input(s): CHOL, HDL, LDLCALC, TRIG, CHOLHDL, LDLDIRECT in the last 72 hours. Thyroid Function Tests: No results  for input(s): TSH, T4TOTAL, FREET4, T3FREE, THYROIDAB in the last 72 hours. Anemia Panel: No results for input(s): VITAMINB12, FOLATE, FERRITIN, TIBC, IRON, RETICCTPCT in the last 72 hours. Sepsis Labs: No results for input(s): PROCALCITON, LATICACIDVEN in the last 168  hours.  Recent Results (from the past 240 hour(s))  MRSA PCR Screening     Status: None   Collection Time: 12/13/17 12:00 PM  Result Value Ref Range Status   MRSA by PCR NEGATIVE NEGATIVE Final    Comment:        The GeneXpert MRSA Assay (FDA approved for NASAL specimens only), is one component of a comprehensive MRSA colonization surveillance program. It is not intended to diagnose MRSA infection nor to guide or monitor treatment for MRSA infections. Performed at [facility], [place] [address], [place], [place] [address]          Radiology Studies: No results found.      Scheduled Meds: . chlorhexidine  15 mL Mouth Rinse BID  . cyanocobalamin  1,000 mcg Intramuscular Daily  . diltiazem  120 mg Oral Daily  . doxazosin  8 mg Oral Daily  . fluconazole  100 mg Oral Daily  . furosemide  20 mg Intravenous Once  . insulin aspart  0-9 Units Subcutaneous TID WC  . mouth rinse  15 mL Mouth Rinse q12n4p  . metoprolol tartrate  25 mg Oral BID  . pantoprazole  40 mg Oral BID  . potassium chloride  40 mEq Oral Once  . pravastatin  20 mg Oral Daily   Continuous Infusions: . sodium chloride Stopped (12/22/17 1053)  . methocarbamol (ROBAXIN)  IV    . pantoprozole (PROTONIX) infusion 8 mg/hr (12/22/17 0300)     LOS: 9 days    Time spent: 40 minutes    Irine Seal, MD [facility] Pager [phone]  If 7PM-7AM, please contact night-coverage [email or web address] Password [facility] 12/22/2017, 10:54 AM

## 2017-12-22 NOTE — Progress Notes (Signed)
Pt stated that he did not want to use CPAP tonight.  Pt states that he does not like the nasal mask.  Pt to notify RT if he decides to wear, machine remains at bedside.  RT to monitor and assess as needed.

## 2017-12-23 ENCOUNTER — Encounter (HOSPITAL_COMMUNITY): Payer: Self-pay | Admitting: Gastroenterology

## 2017-12-23 DIAGNOSIS — B3781 Candidal esophagitis: Secondary | ICD-10-CM | POA: Diagnosis not present

## 2017-12-23 DIAGNOSIS — E118 Type 2 diabetes mellitus with unspecified complications: Secondary | ICD-10-CM | POA: Diagnosis not present

## 2017-12-23 DIAGNOSIS — K259 Gastric ulcer, unspecified as acute or chronic, without hemorrhage or perforation: Secondary | ICD-10-CM | POA: Diagnosis not present

## 2017-12-23 DIAGNOSIS — K5900 Constipation, unspecified: Secondary | ICD-10-CM | POA: Diagnosis not present

## 2017-12-23 DIAGNOSIS — K56699 Other intestinal obstruction unspecified as to partial versus complete obstruction: Secondary | ICD-10-CM | POA: Diagnosis not present

## 2017-12-23 DIAGNOSIS — K922 Gastrointestinal hemorrhage, unspecified: Secondary | ICD-10-CM | POA: Diagnosis not present

## 2017-12-23 DIAGNOSIS — I482 Chronic atrial fibrillation: Secondary | ICD-10-CM | POA: Diagnosis not present

## 2017-12-23 DIAGNOSIS — R69 Illness, unspecified: Secondary | ICD-10-CM | POA: Diagnosis not present

## 2017-12-23 DIAGNOSIS — D62 Acute posthemorrhagic anemia: Secondary | ICD-10-CM | POA: Diagnosis not present

## 2017-12-23 DIAGNOSIS — K269 Duodenal ulcer, unspecified as acute or chronic, without hemorrhage or perforation: Secondary | ICD-10-CM | POA: Diagnosis not present

## 2017-12-23 DIAGNOSIS — I10 Essential (primary) hypertension: Secondary | ICD-10-CM | POA: Diagnosis not present

## 2017-12-23 LAB — BASIC METABOLIC PANEL
ANION GAP: 7 (ref 5–15)
ANION GAP: 8 (ref 5–15)
BUN: 9 mg/dL (ref 6–20)
BUN: 9 mg/dL (ref 6–20)
CALCIUM: 7.9 mg/dL — AB (ref 8.9–10.3)
CHLORIDE: 109 mmol/L (ref 101–111)
CO2: 27 mmol/L (ref 22–32)
CO2: 26 mmol/L (ref 22–32)
CREATININE: 0.99 mg/dL (ref 0.61–1.24)
Calcium: 7.8 mg/dL — ABNORMAL LOW (ref 8.9–10.3)
Chloride: 111 mmol/L (ref 101–111)
Creatinine, Ser: 1.16 mg/dL (ref 0.61–1.24)
GFR calc Af Amer: >60 mL/min (ref >60)
GFR calc non Af Amer: >60 mL/min (ref >60)
GFR calc non Af Amer: >60 mL/min (ref >60)
GLUCOSE: 170 mg/dL — AB (ref 65–99)
Glucose, Bld: 110 mg/dL — ABNORMAL HIGH (ref 65–99)
Potassium: 2.9 mmol/L — ABNORMAL LOW (ref 3.5–5.1)
Potassium: 3.1 mmol/L — ABNORMAL LOW (ref 3.5–5.1)
SODIUM: 143 mmol/L (ref 135–145)
Sodium: 145 mmol/L (ref 135–145)

## 2017-12-23 LAB — CBC
HCT: 24.7 % — ABNORMAL LOW (ref 39.0–52.0)
HEMOGLOBIN: 8.2 g/dL — AB (ref 13.0–17.0)
MCH: 30.5 pg (ref 26.0–34.0)
MCHC: 33.2 g/dL (ref 30.0–36.0)
MCV: 91.8 fL (ref 78.0–100.0)
Platelets: 219 10*3/uL (ref 150–400)
RBC: 2.69 MIL/uL — AB (ref 4.22–5.81)
RDW: 15.3 % (ref 11.5–15.5)
WBC: 6.6 10*3/uL (ref 4.0–10.5)

## 2017-12-23 LAB — BPAM RBC
BLOOD PRODUCT EXPIRATION DATE: 201906092359
BLOOD PRODUCT EXPIRATION DATE: 201906092359
BLOOD PRODUCT EXPIRATION DATE: 201906132359
Blood Product Expiration Date: 201906132359
ISSUE DATE / TIME: 201905301235
ISSUE DATE / TIME: 201905301355
ISSUE DATE / TIME: 201905302228
ISSUE DATE / TIME: 201905310049
UNIT TYPE AND RH: 6200
UNIT TYPE AND RH: 6200
UNIT TYPE AND RH: 6200
Unit Type and Rh: 6200

## 2017-12-23 LAB — TYPE AND SCREEN
ABO/RH(D): A POS
ANTIBODY SCREEN: NEGATIVE
UNIT DIVISION: 0
UNIT DIVISION: 0
Unit division: 0
Unit division: 0

## 2017-12-23 LAB — GLUCOSE, CAPILLARY
GLUCOSE-CAPILLARY: 218 mg/dL — AB (ref 65–99)
Glucose-Capillary: 122 mg/dL — ABNORMAL HIGH (ref 65–99)

## 2017-12-23 LAB — MAGNESIUM: MAGNESIUM: 1.7 mg/dL (ref 1.7–2.4)

## 2017-12-23 MED ORDER — "SYRINGE 22G X 1-1/2"" 3 ML MISC": 1000.0000 ug | Freq: Every day | 0 refills | Status: DC

## 2017-12-23 MED ORDER — TIOTROPIUM BROMIDE MONOHYDRATE 18 MCG IN CAPS: 18.0000 ug | ORAL_CAPSULE | Freq: Every day | RESPIRATORY_TRACT | 0 refills | Status: DC

## 2017-12-23 MED ORDER — POTASSIUM CHLORIDE CRYS ER 20 MEQ PO TBCR: 40.0000 meq | EXTENDED_RELEASE_TABLET | Freq: Every day | ORAL | 0 refills | Status: DC

## 2017-12-23 MED ORDER — METOPROLOL TARTRATE 25 MG PO TABS: 25.0000 mg | ORAL_TABLET | Freq: Two times a day (BID) | ORAL | 0 refills | Status: DC

## 2017-12-23 MED ORDER — PANTOPRAZOLE SODIUM 40 MG PO TBEC: 40.0000 mg | DELAYED_RELEASE_TABLET | Freq: Two times a day (BID) | ORAL | 1 refills | Status: DC

## 2017-12-23 MED ORDER — BUDESONIDE-FORMOTEROL FUMARATE 160-4.5 MCG/ACT IN AERO: 2.0000 | INHALATION_SPRAY | Freq: Two times a day (BID) | RESPIRATORY_TRACT | 0 refills | Status: DC

## 2017-12-23 MED ORDER — POTASSIUM CHLORIDE CRYS ER 20 MEQ PO TBCR
40.0000 meq | EXTENDED_RELEASE_TABLET | Freq: Once | ORAL | Status: AC
  Administered 2017-12-23: 40 meq via ORAL
  Filled 2017-12-23: qty 2

## 2017-12-23 MED ORDER — CYANOCOBALAMIN 1000 MCG/ML IJ SOLN: 1000.0000 ug | Freq: Every day | INTRAMUSCULAR | 0 refills | Status: DC

## 2017-12-23 MED ORDER — ELIQUIS 5 MG PO TABS: ORAL_TABLET | ORAL | 0 refills | Status: DC

## 2017-12-23 MED ORDER — POTASSIUM CHLORIDE CRYS ER 20 MEQ PO TBCR
40.0000 meq | EXTENDED_RELEASE_TABLET | ORAL | Status: AC
  Administered 2017-12-23 (×2): 40 meq via ORAL
  Filled 2017-12-23 (×2): qty 2

## 2017-12-23 MED ORDER — FLUCONAZOLE 100 MG PO TABS: 100.0000 mg | ORAL_TABLET | Freq: Every day | ORAL | 0 refills | Status: AC

## 2017-12-23 NOTE — Discharge Summary (Signed)
Physician Discharge Summary  Manuel [name]:[id number] DOB: 11-03-1945 DOA: 12/12/2017  PCP: Jani Gravel, MD  Admit date: 12/12/2017 Discharge date: 12/23/2017  Time spent: 65 minutes  Recommendations for Outpatient Follow-up:  1. Follow-up with [facility] 12/27/2017 for postop follow-up. 2. Follow-up with Dr. Leighton Ruff in 2 weeks. 3. Follow-up with Dr. Einar Gip, cardiology in 1 week.  On follow-up patient will need a basic metabolic profile done to follow-up on electrolytes and renal function.  Patient is also to follow-up on his A. fib as well as cardiac issues. 4. Follow-up with Dr. Benson Norway, gastroenterology in 3 weeks.  On follow-up patient will need a CBC done to follow-up on H&H. 5. Follow-up with Jani Gravel, MD in 1 week.  On follow-up patient need a basic metabolic profile done to follow-up on electrolytes and renal function.  Patient will need a CBC done to follow-up on H&H.  Patient's vitamin B12 deficiency will need to be followed up upon in the outpatient setting. 6. Patient be discharged home with home health RN for ostomy care.   Discharge Diagnoses:  Principal Problem:   Colonic stricture (HCC) Active Problems:   Acute GI bleeding   Atrial fibrillation (HCC)   Acute posthemorrhagic anemia   Gastric ulcer without hemorrhage or perforation   Duodenal ulcer   BPH (benign prostatic hyperplasia)   DM (diabetes mellitus) (HCC)   HTN (hypertension)   OSA (obstructive sleep apnea)   Hypoxia   Low oxygen saturation   Esophageal candidiasis (HCC)   Melena   Vitamin B12 deficiency   Discharge Condition: Stable and improved  Diet recommendation: Heart healthy  Filed Weights   12/20/17 0430 12/21/17 0347 12/22/17 0617  Weight: 101 kg (222 lb 10.6 oz) 101.5 kg (223 lb 12.3 oz) 101.7 kg (224 lb 3.3 oz)    History of present illness:  Per Dr.Emokpae Manuel [name] is a 72 y.o. male with medical history significant for Atrial Fib, OSA, DM, HTN, who presented  to the Ed at [facility] with complaints of constipation that started a ~week ago, Thursday, but then he was able to have a small bowel movement ~5 days ago, none since.. At baseline he has 1 bowel movement daily, sometimes 2, without use of miralax. With continued abdominal distension, he saw his PCP 1 day prior to admission, who got a CT scan of his Abdomen with contrast that showed-- marked gaseous distension of the colon and pneumatosis within the right colon wall. Pt was told to take miralax and enemas, he used OTC enemas 2ce and at least 3 doses of miralax. Patient reports mild intermittent crampy lower abdominal pain. No vomiting, patient has barely eaten any food in the past few days.  Last colonoscopy 3 years ago by Dr. Benson Norway.  ED Course: AT [facility], Bp soft to stable, Cr- 1.23, lactic acid- 0.84, UA few bact.  General surgery was consulted, Dr. Marcello Moores, who recommended transfer to [facility] long the ED for general surgery consult. Here at [facility],  Patient was started on SMOG enema in the Ed which was discontinued on general surg recommendation. here at [facility]. Dr. Marlou Starks was on-call for general surg, who recommended hospitalist admission and he was seen in consult.     Hospital Course:  1 colonic stricture/abdominal pain with severe constipation Patient had presented with significant abdominal pain abdominal distention noted to be significantly constipated.  Patient underwent a flex sigmoidoscopy that showed a colonic stricture.  Patient seen in consultation by general surgery patient status post  diagnostic laparoscopy, Hartman's procedure with end colostomy per Dr. Marcello Moores 12/13/2017.  Colostomy bag with brown stool. Patient received IV antibiotics through 12/16/2017 and per general surgery no further antibiotics needed at this time.  Patient noted during the hospitalization to have an acute upper GI bleed with melanotic stools in the colostomy as well as some bright red blood noted out of colostomy.  Patient was  transferred to the stepdown unit.  Gastroenterology consulted and patient subsequently underwent upper endoscopy with results as noted below.  Patient transfused 4 units packed red blood cells.  Hemoglobin stabilized.  Bleeding subsided as colostomy bag noted to have brown stool.  Patient still on clear liquid diet which he tolerated.  Diet was advanced to a soft diet which he tolerated.  Stool noted coming out of colostomy.  Patient will be discharged home with home health RN for management of colostomy. She will follow-up with Leighton Ruff, MD, general surgery in 2 weeks for postop follow-up.  Patient will be discharged with home health RN.  2.  A. fib with RVR/hypotension Intraoperatively patient developed A. fib with RVR with hypotension.  Blood pressure improved.  Heart rate in the 150s on ambulation with PT on 12/18/2017.  Heart rates at rest ranging from 90 to the 120s.  Patient noted initially to have a 2.2-second pause on telemetry after he received a dose of Cardizem 180 mg.  Patient noted to have heart rates in the 150s early morning on 12/19/2017, however patient noted to be somewhat hypotensive and also in the acute GI bleed.  Heart rate improved with transfusion of packed red blood cells.  Patient was maintained on home regimen of Cardizem and patient started on Lopressor 25 mg twice daily with better rate control.  Eliquis has been discontinued secondary to acute GI bleed.  Rate control was discussed with Dr. Einar Gip on 12/18/2017.  Could likely resume anticoagulation in 2 weeks or on  follow-up with PCP and cardiologist.  Patient will follow-up with cardiology in the outpatient setting.  3.  Acute GI bleed likely upper GI bleed/symptomatic anemia/acute blood loss anemia/gastric ulcers/duodenal ulcers Patient noted to have melanotic stool out of colostomy bag on 5/29 - 12/19/2017.  Patient also noted to have bright red blood out of colostomy bag overnight per RN note.  Hemoglobin at 8.6 from 8.9  from 8.7 from 9.1 from 9.5 from 7.7 from 11.1 on 12/18/2017.  Patient status post 4 units packed red blood cells on 12/19/2017.  Gastroenterology was reconsulted and patient reassessed by Dr. Benson Norway 12/19/2017 and patient underwent upper endoscopy that showed nonbleeding gastric ulcers as well as plaques in the esophagus concerning for candidiasis.  H pylori was negative on biopsies.  Biopsies negative for malignancy and consistent with chronic gastritis and peptic duodenitis.  Patient noted to have dark greenish output from colostomy. Patient started on clear liquids and diet advanced.  Patient's hemoglobin stabilized.  Patient improved clinically.  Patient was initially on the Protonix drip for 72 hours and subsequently transition to oral Protonix.  Patient be discharged on oral Protonix twice daily x8 weeks and then daily thereafter.  Outpatient follow-up with GI in 3 weeks.   4.  Esophageal candidiasis Per Upper endoscopy on 12/19/2017.  She was started on Diflucan and will be discharged home on 8 more days of oral Diflucan to complete a 10-day course of treatment.  Outpatient follow-up with GI.   5.  Hypertension Patient noted to be hypotensive the morning of 12/19/2017.  Secondary to acute  blood loss.  Blood pressure improved.  Status post 4 units packed red blood cells 12/19/2017.  Fluids were saline locked.  Patient was maintained on Lopressor and Cardizem with better blood pressure control.  Outpatient follow-up.   6.  BPH Foley catheter initially placed on 12/16/2017.  Foley catheter has been removed.    Patient maintained on home regimen of doxazosin.  Patient had good urine output.  Outpatient follow-up.    7.  Diabetes mellitus type 2 Patient's oral hypoglycemic agents were held throughout the hospitalization.  Patient was maintained on sliding scale insulin.  Patient's oral hypoglycemic agents will be resumed on discharge.   8.  Acute hypoxic respiratory failure Likely secondary to postop  atelectasis initially.  Patient noted to be hypoxic the morning of 12/19/2017 and placed on O2.  Patient also noted to be anemic which was felt to be an etiology of his hypoxemia.  New O2 requirements likely secondary to symptomatic anemia/acute GI bleed.  O2 requirements have improved posttransfusion of 4 units packed red blood cells.  Patient also noted to have a component of wheezing and started on scheduled nebulizer treatments as well as Dulera.  Hypoxia resolved by day of discharge.  9 tobacco abuse Tobacco cessation.  Placed on Dulera and nebulizer treatments scheduled during the hospitalization.  Patient will be discharged home on Symbicort and Spiriva.  Outpatient follow-up.  10.  Vitamin B12 deficiency Anemia panel which was done during the hospitalization showed patient to have vitamin B12 deficiency with a B12 level of 127.  Patient was started on vitamin B12 IM injections 1000 MCG's daily x1 week, and then weekly x1 month, and then monthly.  Outpatient follow-up with PCP.  11.  Hypokalemia Patient noted to be hypokalemic during the hospitalization.  Patient was given oral potassium supplementation.  Patient will be discharged home with K. Dur 40 mEq daily x3 days.  Outpatient follow-up with PCP and cardiology.  Patient will need repeat basic metabolic profile done to follow-up on electrolytes and renal function.     Procedures:  CT abdomen and pelvis 12/11/2017  Flexible sigmoidoscopy 12/13/2017--Per Dr. Benson Norway  Diagnostic laparoscopy, Hartman's procedure with end colostomy per Dr. Marcello Moores 12/13/2017  4 units packed red blood cells 12/19/2017  Upper endoscopy 12/19/2017 per Dr. Benson Norway      Consultations:  General surgery: Dr. Marlou Starks 12/12/2017  [place] RN Cristy Hilts, RN 12/15/2017  Gastroenterology: Dr. Benson Norway 12/13/2017      Discharge Exam: Vitals:   12/23/17 0833 12/23/17 1350  BP:  124/70  Pulse:  73  Resp:  18  Temp:    SpO2: 92% 93%    General:  NAD Cardiovascular: RRR Respiratory: CTAB  Discharge Instructions   Discharge Instructions    Diet - low sodium heart healthy   Complete by:  As directed    Increase activity slowly   Complete by:  As directed      Allergies as of 12/23/2017      Reactions   Sulfa Antibiotics Other (See Comments)   Headaches       Medication List    STOP taking these medications   lisinopril 20 MG tablet Commonly known as:  PRINIVIL,ZESTRIL   metroNIDAZOLE 500 MG tablet Commonly known as:  FLAGYL     TAKE these medications   acetaminophen 500 MG tablet Commonly known as:  TYLENOL Take 500 mg by mouth every 6 (six) hours as needed for moderate pain.   budesonide-formoterol 160-4.5 MCG/ACT inhaler Commonly known as:  SYMBICORT Inhale  2 puffs into the lungs 2 (two) times daily.   cyanocobalamin 1000 MCG/ML injection Commonly known as:  (VITAMIN B-12) Inject 1 mL (1,000 mcg total) into the muscle daily. Take 1000 MCG's IM daily x4 days, then 1000 MCG's IM weekly x1 month, then 1000 MCG's IM monthly. Start taking on:  12/24/2017   diltiazem 120 MG 24 hr capsule Commonly known as:  CARDIZEM CD Take 120 mg by mouth daily.   doxazosin 8 MG tablet Commonly known as:  CARDURA Take 8 mg by mouth daily.   ELIQUIS 5 MG Tabs tablet Generic drug:  apixaban TK 1 T PO BID AFTER FOOD Start taking on:  01/06/2018 What changed:  These instructions start on 01/06/2018. If you are unsure what to do until then, ask your doctor or other care provider.   fluconazole 100 MG tablet Commonly known as:  DIFLUCAN Take 1 tablet (100 mg total) by mouth daily for 8 days. Start taking on:  12/24/2017   glimepiride 1 MG tablet Commonly known as:  AMARYL Take 1 mg by mouth daily with breakfast.   metFORMIN 500 MG tablet Commonly known as:  GLUCOPHAGE Take 500 mg by mouth daily.   metoprolol tartrate 25 MG tablet Commonly known as:  LOPRESSOR Take 1 tablet (25 mg total) by mouth 2 (two) times daily.    pantoprazole 40 MG tablet Commonly known as:  PROTONIX Take 1 tablet (40 mg total) by mouth 2 (two) times daily. Take 1 tablet twice daily x8 weeks, and then 1 tablet daily thereafter.   potassium chloride SA 20 MEQ tablet Commonly known as:  K-DUR,KLOR-CON Take 2 tablets (40 mEq total) by mouth daily for 3 days.   pravastatin 20 MG tablet Commonly known as:  PRAVACHOL Take 20 mg by mouth daily.   SYRINGE 3CC/22GX1-1/2" 22G X 1-1/2" 3 ML Misc 1,000 mcg by Does not apply route daily.   tiotropium 18 MCG inhalation capsule Commonly known as:  SPIRIVA HANDIHALER Place 1 capsule (18 mcg total) into inhaler and inhale daily.      Allergies  Allergen Reactions  . Sulfa Antibiotics Other (See Comments)    Headaches    Follow-up Information    [facility] Follow up on 12/27/2017.   Specialty:  General Surgery Why:  at 10:00 AM for staple removal by a nurse. please arrive by 9:30 AM to get checked in and fill out any necessary paperwork. Contact information: [address] [place] [phone]       Leighton Ruff, MD. Schedule an appointment as soon as possible for a visit in 2 week(s).   Specialty:  General Surgery Why:  for post-operative follow up.  Contact information: [place] [address] [phone]        Adrian Prows, MD. Schedule an appointment as soon as possible for a visit in 1 week(s).   Specialty:  Cardiology Contact information: [address] [place] [address] [phone]        Carol Ada, MD. Schedule an appointment as soon as possible for a visit in 3 week(s).   Specialty:  Gastroenterology Why:  f/u in 3 weeks. Contact information: [address] [place] [address] [phone]        Jani Gravel, MD. Schedule an appointment as soon as possible for a visit in 1 week(s).   Specialty:  Internal Medicine Contact  information: [address] [place] [address]  [phone]            The results of significant diagnostics from this hospitalization (including imaging, microbiology, ancillary and laboratory) are listed below for reference.    Significant Diagnostic Studies: Ct Abdomen Pelvis W Contrast  Result Date: 12/11/2017 CLINICAL DATA:  Left lower quadrant and mid abdominal pain. Constipation. EXAM: CT ABDOMEN AND PELVIS WITH CONTRAST TECHNIQUE: Multidetector CT imaging of the abdomen and pelvis was performed using the standard protocol following bolus administration of intravenous contrast. CONTRAST:  147m ISOVUE-300 IOPAMIDOL (ISOVUE-300) INJECTION 61% COMPARISON:  None. FINDINGS: Lower chest: Subsegmental atelectasis or scarring in the lung bases. Cardiomegaly. No effusions. Hepatobiliary: No focal hepatic abnormality. Gallbladder unremarkable. Pancreas: No focal abnormality or ductal dilatation. Spleen: No focal abnormality.  Normal size. Adrenals/Urinary Tract: Small nodule in the left adrenal gland measures 15 mm, nonspecific. Absence of the left kidney. Right kidney is unremarkable. No hydronephrosis. Urinary bladder unremarkable. Stomach/Bowel: There is marked gaseous distention of the colon with large amount of stool and gas in the colon to the level of the mid sigmoid colon. There is pneumatosis within the wall of the cecum and ascending colon several small bowel loops are dilated and fluid-filled as well. Stomach is decompressed, unremarkable. Sigmoid diverticulosis. No active diverticulitis. Vascular/Lymphatic: No evidence of aneurysm or adenopathy. Mesenteric vessels are widely patent. Reproductive: No visible focal abnormality. Other: Small amount of free fluid in the pelvis and right paracolic gutter. Musculoskeletal: Diffuse degenerative changes in the lumbar spine. IMPRESSION: Marked gaseous distention of the colon to the level of the mid sigmoid colon where there is  sigmoid diverticulosis and mild wall thickening which could be related to diverticulosis and muscular hypertrophy or could reflect an annular colonic lesion. In addition, there is pneumatosis within the right colon wall. Large stool burden in the right colon, transverse colon and descending colon. Small bowel loops are also mild to moderately dilated. Absence of the left kidney. Small left adrenal nodule, 15 mm, nonspecific. Small amount of free fluid in the right lower quadrant and cul-de-sac. These results were called by telephone at the time of interpretation on 12/11/2017 at 1:55 pm to Dr. JJani Gravel, who verbally acknowledged these results. Electronically Signed   By: KRolm BaptiseM.D.   On: 12/11/2017 13:53   Dg Chest Port 1 View  Result Date: 12/19/2017 CLINICAL DATA:  Decreased O2 sats EXAM: PORTABLE CHEST 1 VIEW COMPARISON:  03/28/2016 FINDINGS: Mild cardiomegaly. Linear bibasilar atelectasis or scarring, similar to prior study, favor scarring. No effusions or acute bony abnormality. IMPRESSION: Chronic bibasilar densities, likely scarring.  Mild cardiomegaly. Electronically Signed   By: KRolm BaptiseM.D.   On: 12/19/2017 09:35    Microbiology: No results found for this or any previous visit (from the past 240 hour(s)).   Labs: Basic Metabolic Panel: Recent Labs  Lab 12/19/17 0414 12/20/17 0732 12/21/17 0322 12/22/17 0321 12/22/17 1551 12/23/17 0523 12/23/17 1500  NA 143 145 147* 146* 145 143 145  K 4.0 3.9 3.3* 2.9* 3.2* 2.9* 3.1*  CL 107 109 110 110 110 109 111  CO2 28 33* _0 GLUCOSE 157* 117* 95 107* 127* 110* 170*  BUN 38* 26* _1 CREATININE 1.02 0.96 1.01 1.02 1.05 0.99 1.16  CALCIUM 7.8* 7.6* 7.9* 7.8* 7.8* 7.8* 7.9*  MG 1.5* 2.3  --  1.8  --  1.7  --    Liver Function Tests: No results for input(s): AST, ALT, ALKPHOS, BILITOT,  PROT, ALBUMIN in the last 168 hours. No results for input(s): LIPASE, AMYLASE in the last 168 hours. No results for  input(s): AMMONIA in the last 168 hours. CBC: Recent Labs  Lab 12/18/17 1311  12/19/17 1355  12/20/17 0732 12/20/17 1609 12/21/17 0322 12/21/17 1559 12/22/17 0321 12/23/17 0523  WBC 4.1   < > 6.6  --  7.0  --  5.9  --  7.1 6.6  NEUTROABS 2.7  --   --   --   --   --  4.7  --   --   --   HGB 11.1*   < > 6.9*   < > 9.5* 9.1* 8.7* 8.9* 8.6* 8.2*  HCT 35.8*   < > 20.9*   < > 29.8* 28.5* 26.3* 27.6* 26.9* 24.7*  MCV 92.7   < > 90.1  --  91.7  --  91.6  --  92.8 91.8  PLT 192   < > 179  179  --  157  --  169  --  186 219   < > = values in this interval not displayed.   Cardiac Enzymes: No results for input(s): CKTOTAL, CKMB, CKMBINDEX, TROPONINI in the last 168 hours. BNP: BNP (last 3 results) No results for input(s): BNP in the last 8760 hours.  ProBNP (last 3 results) No results for input(s): PROBNP in the last 8760 hours.  CBG: Recent Labs  Lab 12/21/17 2130 12/22/17 0802 12/22/17 1156 12/22/17 1859 12/23/17 0843  GLUCAP 138* 111* 154* 149* 218*       Signed:  Irine Seal MD.  [facility] 12/23/2017, 4:42 PM

## 2017-12-23 NOTE — Progress Notes (Signed)
Physical Therapy Treatment Patient Details Name: Manuel [name] MRN: [id number] DOB: Feb 22, 1946 Today's Date: 12/23/2017    History of Present Illness This is a 72 year old male with multiple medical problems who presents to the hospital with complaints of worsening abdominal distension; s/p  LAPAROSCOPIC END COLOSTOMY HARTMAN'S PROCEDURE; PMH: afib, cardioversion, DM    PT Comments    Pt hopes to go home next day or so.  Progressing with mobility.   Follow Up Recommendations  No PT follow up     Equipment Recommendations  None recommended by PT    Recommendations for Other Services       Precautions / Restrictions Precautions Precautions: None Precaution Comments: monitor HR Restrictions Weight Bearing Restrictions: No    Mobility  Bed Mobility               General bed mobility comments: sitting EOB on arrival  Transfers Overall transfer level: Modified independent Equipment used: None Transfers: Sit to/from [facility] Sit to Stand: Modified independent (Device/Increase time) Stand pivot transfers: Modified independent (Device/Increase time)       General transfer comment: increased time  Ambulation/Gait Ambulation/Gait assistance: Supervision Ambulation Distance (Feet): 400 Feet Assistive device: None;1 person hand held assist   Gait velocity: decreased but functional   General Gait Details: intermittent HHA, light touch to rail   Stairs             Wheelchair Mobility    Modified Rankin (Stroke Patients Only)       Balance                                            Cognition Arousal/Alertness: Awake/alert Behavior During Therapy: WFL for tasks assessed/performed Overall Cognitive Status: Within Functional Limits for tasks assessed                                 General Comments: 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 to care for himself      Exercises       General Comments        Pertinent Vitals/Pain Pain Assessment: No/denies pain    Home Living                      Prior Function            PT Goals (current goals can now be found in the care plan section) Progress towards PT goals: Progressing toward goals    Frequency    Min 3X/week      PT Plan Current plan remains appropriate    Co-evaluation              AM-PAC PT "6 Clicks" Daily Activity  Outcome Measure  Difficulty turning over in bed (including adjusting bedclothes, sheets and blankets)?: None Difficulty moving from lying on back to sitting on the side of the bed? : None Difficulty sitting down on and standing up from a chair with arms (e.g., wheelchair, bedside commode, etc,.)?: None Help needed moving to and from a bed to chair (including a wheelchair)?: None Help needed walking in hospital room?: None Help needed climbing 3-5 steps with a railing? : A Little 6 Click Score: 23    End of Session Equipment Utilized During Treatment:  Gait belt Activity Tolerance: Patient tolerated treatment well Patient left: in chair;with call bell/phone within reach;with chair alarm set   PT Visit Diagnosis: Difficulty in walking, not elsewhere classified (R26.2)     Time: 1515-8265 PT Time Calculation (min) (ACUTE ONLY): 18 min  Charges:  $Gait Training: 8-22 mins                    G Codes:       Rica Koyanagi  PTA [facility]  Acute  Rehab Pager      [phone]

## 2017-12-23 NOTE — Progress Notes (Signed)
[facility] Progress Note  4 Days Post-Op  Subjective: CC: States that he feels great. Denies pain. Emptying brown stool from colostomy pouch, denies bleeding. Tolerating toast and eggs/bacon for breakfast. Ambulating.   Objective: Vital signs in last 24 hours: Temp:  [97.9 F (36.6 C)-98.3 F (36.8 C)] 97.9 F (36.6 C) (06/03 0519) Pulse Rate:  [78-103] 83 (06/03 0519) Resp:  [16-20] 18 (06/03 0519) BP: (111-138)/(55-77) 138/77 (06/03 0519) SpO2:  [91 %-97 %] 92 % (06/03 0833) Last BM Date: 12/22/17  Intake/Output from previous day: 06/02 0701 - 06/03 0700 In: 360 [P.O.:360] Out: 850 [Stool:850] Intake/Output this shift: No intake/output data recorded.  PE: Gen:  Alert, NAD, pleasant Pulm:  Normal effort Abd: Soft, non-tender, non-distended, bowel sounds present in all 4 quadrants, no HSM, midline incisions C/D/I w/ staples, LLQ stoma viable, gas in colostomy pouch Skin: warm and dry, no rashes  Psych: A&Ox3   Lab Results:  Recent Labs    12/22/17 0321 12/23/17 0523  WBC 7.1 6.6  HGB 8.6* 8.2*  HCT 26.9* 24.7*  PLT 186 219   BMET Recent Labs    12/22/17 1551 12/23/17 0523  NA 145 143  K 3.2* 2.9*  CL 110 109  CO2 27 27  GLUCOSE 127* 110*  BUN 10 9  CREATININE 1.05 0.99  CALCIUM 7.8* 7.8*   PT/INR No results for input(s): LABPROT, INR in the last 72 hours. CMP     Component Value Date/Time   NA 143 12/23/2017 0523   K 2.9 (L) 12/23/2017 0523   CL 109 12/23/2017 0523   CO2 27 12/23/2017 0523   GLUCOSE 110 (H) 12/23/2017 0523   BUN 9 12/23/2017 0523   CREATININE 0.99 12/23/2017 0523   CREATININE 1.23 04/10/2011 1307   CALCIUM 7.8 (L) 12/23/2017 0523   PROT 6.4 (L) 12/12/2017 1335   ALBUMIN 3.3 (L) 12/12/2017 1335   AST 17 12/12/2017 1335   ALT 15 (L) 12/12/2017 1335   ALKPHOS 70 12/12/2017 1335   BILITOT 1.4 (H) 12/12/2017 1335   GFRNONAA >60 12/23/2017 0523   GFRAA >60 12/23/2017 0523   Lipase     Component Value Date/Time    LIPASE 21 12/12/2017 1335       Studies/Results: No results found.  Anti-infectives: Anti-infectives (From admission, onward)   Start     Dose/Rate Route Frequency Ordered Stop   12/19/17 1800  fluconazole (DIFLUCAN) tablet 100 mg     100 mg Oral Daily 12/19/17 1542     12/13/17 2200  metroNIDAZOLE (FLAGYL) IVPB 500 mg  Status:  Discontinued     500 mg 100 mL/hr over 60 Minutes Intravenous Every 8 hours 12/13/17 1859 12/16/17 0741   12/13/17 1930  cefTRIAXone (ROCEPHIN) injection 1 g  Status:  Discontinued     1 g Intramuscular Every 24 hours 12/13/17 1859 12/13/17 1910   12/13/17 1930  cefTRIAXone (ROCEPHIN) injection 2 g  Status:  Discontinued     2 g Intramuscular Every 24 hours 12/13/17 1910 12/13/17 1912   12/13/17 1930  cefTRIAXone (ROCEPHIN) 2 g in sodium chloride 0.9 % 100 mL IVPB  Status:  Discontinued     2 g 200 mL/hr over 30 Minutes Intravenous Every 24 hours 12/13/17 1912 12/16/17 0741   12/13/17 1400  cefoTEtan (CEFOTAN) 2 g in sodium chloride 0.9 % 100 mL IVPB  Status:  Discontinued    Note to Pharmacy:  Pharmacy may adjust dose strength for optimal dosing.   Send with patient on  call to the OR.  Anesthesia to complete antibiotic administration <44mn [facility]   2 g 200 mL/hr over 30 Minutes Intravenous To Surgery 12/13/17 1105 12/13/17 1859   12/12/17 2200  metroNIDAZOLE (FLAGYL) IVPB 500 mg  Status:  Discontinued     500 mg 100 mL/hr over 60 Minutes Intravenous Every 8 hours 12/12/17 2109 12/13/17 1859     Assessment/Plan s/p LAPAROSCOPIC END COLOSTOMY HARTMAN'S PEEATVVLRT7/40/99Dr. ALeighton Ruff- PYTS#00 afebrile, VSS - path w/ acute and chronic inflammation/diveritulitis, no malignancy noted.  - having gas and stool in colostomy pouch; greatly appreciate WOC assistance with pt education - Tolerating a SOFT diet  - mobilize TID and use IS - Stable for discharge from surgical standpoint; follow up and D/C instructions  provided. Will need HH RN for ostomy care.  FEN: Reg diet,  ID: periop cefotetan VTE: SCD's, Eliquis discontinued 2/2 suspected UGI bleed, to be re-started on an outpatient basis  Foley: none Follow up: Dr. ALeighton Ruff Dr. HBenson [place]  Dr. GEinar Gip PCP        LOS: 10 days    EJill Alexanders, PParis [facility] 12/23/2017, 8:55 AM Pager: [phone]: [phone]-Fri 7:00 am-4:30 pm Sat-Sun 7:00 am-11:30 am

## 2017-12-23 NOTE — Consult Note (Signed)
[place] Nurse ostomy follow up Stoma type/location: LUQ colostomy Stomal assessment/size: 2 inches round, red, moist, raised, os at center Peristomal assessment: Mild mucocutaneous separation from 9-12 (<0.25 inch). Treatment options for stomal/peristomal skin:Skin barrier ring  Output: Stoma very active during session today.  Yellow seedy effluent, moderate to large amount. Ostomy pouching: 2pc. 2 and 3/4 inch pouching system with skin barrier ring Education provided: Extended session for wife to to take the lead with pouch change.  [place] Nurse had to intervene when stoma because very active during session, but it was a great opportunity to experience foiled plans. Discussed use of ostomy appliance belt (accessory), one provided today and one was previously requested by this Probation officer for delivery by [employer] program. Wife is able to apply and remove, patient understands use parameters. Discussion of ongoing supplies with a third party  Enrolled patient in [place] Start Discharge program: Yes.  Supplies have already been received at home.    [place] nursing team will follow along until discharge, and will remain available to this patient, his family, the nursing, surgical and medical teams.  Thanks, Maudie Flakes, MSN, RN, [place], Arther Abbott  Pager# [phone]

## 2017-12-24 DIAGNOSIS — K5730 Diverticulosis of large intestine without perforation or abscess without bleeding: Secondary | ICD-10-CM | POA: Diagnosis not present

## 2017-12-24 DIAGNOSIS — Z433 Encounter for attention to colostomy: Secondary | ICD-10-CM | POA: Diagnosis not present

## 2017-12-24 DIAGNOSIS — K56699 Other intestinal obstruction unspecified as to partial versus complete obstruction: Secondary | ICD-10-CM | POA: Diagnosis not present

## 2017-12-24 DIAGNOSIS — D62 Acute posthemorrhagic anemia: Secondary | ICD-10-CM | POA: Diagnosis not present

## 2017-12-24 DIAGNOSIS — Z48815 Encounter for surgical aftercare following surgery on the digestive system: Secondary | ICD-10-CM | POA: Diagnosis not present

## 2017-12-24 DIAGNOSIS — K269 Duodenal ulcer, unspecified as acute or chronic, without hemorrhage or perforation: Secondary | ICD-10-CM | POA: Diagnosis not present

## 2017-12-24 DIAGNOSIS — K259 Gastric ulcer, unspecified as acute or chronic, without hemorrhage or perforation: Secondary | ICD-10-CM | POA: Diagnosis not present

## 2017-12-24 DIAGNOSIS — K5900 Constipation, unspecified: Secondary | ICD-10-CM | POA: Diagnosis not present

## 2017-12-24 DIAGNOSIS — K6389 Other specified diseases of intestine: Secondary | ICD-10-CM | POA: Diagnosis not present

## 2017-12-24 DIAGNOSIS — J9601 Acute respiratory failure with hypoxia: Secondary | ICD-10-CM | POA: Diagnosis not present

## 2017-12-26 DIAGNOSIS — E119 Type 2 diabetes mellitus without complications: Secondary | ICD-10-CM | POA: Diagnosis not present

## 2017-12-26 DIAGNOSIS — Z0000 Encounter for general adult medical examination without abnormal findings: Secondary | ICD-10-CM | POA: Diagnosis not present

## 2017-12-26 DIAGNOSIS — R21 Rash and other nonspecific skin eruption: Secondary | ICD-10-CM | POA: Diagnosis not present

## 2017-12-26 DIAGNOSIS — I10 Essential (primary) hypertension: Secondary | ICD-10-CM | POA: Diagnosis not present

## 2017-12-26 DIAGNOSIS — R109 Unspecified abdominal pain: Secondary | ICD-10-CM | POA: Diagnosis not present

## 2017-12-26 DIAGNOSIS — K5730 Diverticulosis of large intestine without perforation or abscess without bleeding: Secondary | ICD-10-CM | POA: Diagnosis not present

## 2017-12-26 DIAGNOSIS — I480 Paroxysmal atrial fibrillation: Secondary | ICD-10-CM | POA: Diagnosis not present

## 2018-01-02 ENCOUNTER — Inpatient Hospital Stay (HOSPITAL_COMMUNITY)
Admission: EM | Admit: 2018-01-02 | Discharge: 2018-01-07 | DRG: 389 | Disposition: A | Discharge status: Home / Self Care | Payer: Medicare HMO | Attending: Family Medicine | Admitting: Family Medicine

## 2018-01-02 ENCOUNTER — Encounter (HOSPITAL_COMMUNITY): Payer: Self-pay

## 2018-01-02 ENCOUNTER — Other Ambulatory Visit: Payer: Self-pay

## 2018-01-02 ENCOUNTER — Emergency Department (HOSPITAL_COMMUNITY): Payer: Medicare HMO

## 2018-01-02 DIAGNOSIS — K56609 Unspecified intestinal obstruction, unspecified as to partial versus complete obstruction: Secondary | ICD-10-CM

## 2018-01-02 DIAGNOSIS — Z7984 Long term (current) use of oral hypoglycemic drugs: Secondary | ICD-10-CM | POA: Diagnosis not present

## 2018-01-02 DIAGNOSIS — Z933 Colostomy status: Secondary | ICD-10-CM | POA: Diagnosis not present

## 2018-01-02 DIAGNOSIS — I5030 Unspecified diastolic (congestive) heart failure: Secondary | ICD-10-CM | POA: Diagnosis present

## 2018-01-02 DIAGNOSIS — K259 Gastric ulcer, unspecified as acute or chronic, without hemorrhage or perforation: Secondary | ICD-10-CM | POA: Diagnosis not present

## 2018-01-02 DIAGNOSIS — K565 Intestinal adhesions [bands], unspecified as to partial versus complete obstruction: Principal | ICD-10-CM | POA: Diagnosis present

## 2018-01-02 DIAGNOSIS — Z882 Allergy status to sulfonamides status: Secondary | ICD-10-CM | POA: Diagnosis not present

## 2018-01-02 DIAGNOSIS — R1110 Vomiting, unspecified: Secondary | ICD-10-CM | POA: Diagnosis not present

## 2018-01-02 DIAGNOSIS — I361 Nonrheumatic tricuspid (valve) insufficiency: Secondary | ICD-10-CM | POA: Diagnosis not present

## 2018-01-02 DIAGNOSIS — I482 Chronic atrial fibrillation: Secondary | ICD-10-CM | POA: Diagnosis not present

## 2018-01-02 DIAGNOSIS — Z7901 Long term (current) use of anticoagulants: Secondary | ICD-10-CM | POA: Diagnosis not present

## 2018-01-02 DIAGNOSIS — G4733 Obstructive sleep apnea (adult) (pediatric): Secondary | ICD-10-CM | POA: Diagnosis not present

## 2018-01-02 DIAGNOSIS — E118 Type 2 diabetes mellitus with unspecified complications: Secondary | ICD-10-CM | POA: Diagnosis not present

## 2018-01-02 DIAGNOSIS — Z87891 Personal history of nicotine dependence: Secondary | ICD-10-CM | POA: Diagnosis not present

## 2018-01-02 DIAGNOSIS — N400 Enlarged prostate without lower urinary tract symptoms: Secondary | ICD-10-CM | POA: Diagnosis not present

## 2018-01-02 DIAGNOSIS — K56699 Other intestinal obstruction unspecified as to partial versus complete obstruction: Secondary | ICD-10-CM | POA: Diagnosis not present

## 2018-01-02 DIAGNOSIS — I110 Hypertensive heart disease with heart failure: Secondary | ICD-10-CM | POA: Diagnosis present

## 2018-01-02 DIAGNOSIS — E785 Hyperlipidemia, unspecified: Secondary | ICD-10-CM | POA: Diagnosis not present

## 2018-01-02 DIAGNOSIS — Z4682 Encounter for fitting and adjustment of non-vascular catheter: Secondary | ICD-10-CM | POA: Diagnosis not present

## 2018-01-02 DIAGNOSIS — R112 Nausea with vomiting, unspecified: Secondary | ICD-10-CM | POA: Diagnosis not present

## 2018-01-02 DIAGNOSIS — Z7951 Long term (current) use of inhaled steroids: Secondary | ICD-10-CM | POA: Diagnosis not present

## 2018-01-02 DIAGNOSIS — I10 Essential (primary) hypertension: Secondary | ICD-10-CM | POA: Diagnosis not present

## 2018-01-02 DIAGNOSIS — E119 Type 2 diabetes mellitus without complications: Secondary | ICD-10-CM | POA: Diagnosis present

## 2018-01-02 DIAGNOSIS — Z79899 Other long term (current) drug therapy: Secondary | ICD-10-CM | POA: Diagnosis not present

## 2018-01-02 DIAGNOSIS — M7989 Other specified soft tissue disorders: Secondary | ICD-10-CM | POA: Diagnosis not present

## 2018-01-02 DIAGNOSIS — I4891 Unspecified atrial fibrillation: Secondary | ICD-10-CM | POA: Diagnosis not present

## 2018-01-02 DIAGNOSIS — Z0189 Encounter for other specified special examinations: Secondary | ICD-10-CM

## 2018-01-02 LAB — URINALYSIS, ROUTINE W REFLEX MICROSCOPIC
BACTERIA UA: NONE SEEN
BILIRUBIN URINE: NEGATIVE
Glucose, UA: NEGATIVE mg/dL
HGB URINE DIPSTICK: NEGATIVE
Ketones, ur: NEGATIVE mg/dL
LEUKOCYTES UA: NEGATIVE
Nitrite: NEGATIVE
Protein, ur: 100 mg/dL — AB
SPECIFIC GRAVITY, URINE: 1.02 (ref 1.005–1.030)
pH: 5 (ref 5.0–8.0)

## 2018-01-02 LAB — LIPASE, BLOOD: LIPASE: 18 U/L (ref 11–51)

## 2018-01-02 LAB — CBC
HEMATOCRIT: 31.5 % — AB (ref 39.0–52.0)
Hemoglobin: 9.7 g/dL — ABNORMAL LOW (ref 13.0–17.0)
MCH: 28.8 pg (ref 26.0–34.0)
MCHC: 30.8 g/dL (ref 30.0–36.0)
MCV: 93.5 fL (ref 78.0–100.0)
Platelets: 283 10*3/uL (ref 150–400)
RBC: 3.37 MIL/uL — AB (ref 4.22–5.81)
RDW: 16.5 % — AB (ref 11.5–15.5)
WBC: 5.5 10*3/uL (ref 4.0–10.5)

## 2018-01-02 LAB — COMPREHENSIVE METABOLIC PANEL
ALT: 15 U/L — ABNORMAL LOW (ref 17–63)
ANION GAP: 6 (ref 5–15)
AST: 14 U/L — AB (ref 15–41)
Albumin: 3.1 g/dL — ABNORMAL LOW (ref 3.5–5.0)
Alkaline Phosphatase: 78 U/L (ref 38–126)
BILIRUBIN TOTAL: 1 mg/dL (ref 0.3–1.2)
BUN: 9 mg/dL (ref 6–20)
CHLORIDE: 111 mmol/L (ref 101–111)
CO2: 29 mmol/L (ref 22–32)
Calcium: 8.3 mg/dL — ABNORMAL LOW (ref 8.9–10.3)
Creatinine, Ser: 1.09 mg/dL (ref 0.61–1.24)
Glucose, Bld: 166 mg/dL — ABNORMAL HIGH (ref 65–99)
POTASSIUM: 3.6 mmol/L (ref 3.5–5.1)
Sodium: 146 mmol/L — ABNORMAL HIGH (ref 135–145)
TOTAL PROTEIN: 6 g/dL — AB (ref 6.5–8.1)

## 2018-01-02 MED ORDER — ONDANSETRON HCL 4 MG PO TABS: 4.0000 mg | ORAL_TABLET | Freq: Four times a day (QID) | ORAL | Status: DC | PRN

## 2018-01-02 MED ORDER — ONDANSETRON HCL 4 MG/2ML IJ SOLN
4.0000 mg | Freq: Once | INTRAMUSCULAR | Status: AC
  Administered 2018-01-02: 4 mg via INTRAVENOUS
  Filled 2018-01-02: qty 2

## 2018-01-02 MED ORDER — IOPAMIDOL (ISOVUE-300) INJECTION 61%
INTRAVENOUS | Status: AC
  Administered 2018-01-02: 19:00:00
  Filled 2018-01-02: qty 100

## 2018-01-02 MED ORDER — MORPHINE SULFATE (PF) 2 MG/ML IV SOLN: 2.0000 mg | INTRAVENOUS | Status: DC | PRN

## 2018-01-02 MED ORDER — POTASSIUM CHLORIDE IN NACL 20-0.45 MEQ/L-% IV SOLN
INTRAVENOUS | Status: DC
  Administered 2018-01-02: via INTRAVENOUS
  Filled 2018-01-02 (×2): qty 1000

## 2018-01-02 MED ORDER — IOPAMIDOL (ISOVUE-300) INJECTION 61%
100.0000 mL | Freq: Once | INTRAVENOUS | Status: AC | PRN
  Administered 2018-01-02: 100 mL via INTRAVENOUS

## 2018-01-02 MED ORDER — FAMOTIDINE IN NACL 20-0.9 MG/50ML-% IV SOLN
20.0000 mg | Freq: Two times a day (BID) | INTRAVENOUS | Status: DC
  Administered 2018-01-02 – 2018-01-07 (×10): 20 mg via INTRAVENOUS
  Filled 2018-01-02 (×10): qty 50

## 2018-01-02 MED ORDER — METOPROLOL TARTRATE 5 MG/5ML IV SOLN
5.0000 mg | Freq: Three times a day (TID) | INTRAVENOUS | Status: DC
  Administered 2018-01-02 – 2018-01-03 (×3): 5 mg via INTRAVENOUS
  Filled 2018-01-02 (×3): qty 5

## 2018-01-02 MED ORDER — MOMETASONE FURO-FORMOTEROL FUM 200-5 MCG/ACT IN AERO
2.0000 | INHALATION_SPRAY | Freq: Two times a day (BID) | RESPIRATORY_TRACT | Status: DC
Start: 2018-01-02 — End: 2018-01-07
  Administered 2018-01-02 – 2018-01-06 (×9): 2 via RESPIRATORY_TRACT
  Filled 2018-01-02: qty 8.8

## 2018-01-02 MED ORDER — INSULIN ASPART 100 UNIT/ML ~~LOC~~ SOLN
0.0000 [IU] | SUBCUTANEOUS | Status: DC
  Administered 2018-01-06: 1 [IU] via SUBCUTANEOUS

## 2018-01-02 MED ORDER — MORPHINE SULFATE (PF) 2 MG/ML IV SOLN
2.0000 mg | Freq: Once | INTRAVENOUS | Status: AC
  Administered 2018-01-02: 2 mg via INTRAVENOUS
  Filled 2018-01-02: qty 1

## 2018-01-02 MED ORDER — ONDANSETRON HCL 4 MG/2ML IJ SOLN: 4.0000 mg | Freq: Four times a day (QID) | INTRAMUSCULAR | Status: DC | PRN

## 2018-01-02 MED ORDER — TIOTROPIUM BROMIDE MONOHYDRATE 18 MCG IN CAPS
18.0000 ug | ORAL_CAPSULE | Freq: Every day | RESPIRATORY_TRACT | Status: DC
  Administered 2018-01-03 – 2018-01-06 (×4): 18 ug via RESPIRATORY_TRACT
  Filled 2018-01-02: qty 5

## 2018-01-02 NOTE — Progress Notes (Signed)
Subjective: 72 y.o. [name]/p Hartman's procedure for sigmoid stricture on 5/24.  Presents to ED with 24 hrs of decreased colostomy output, nausea and vomiting    Objective: Vital signs in last 24 hours: Temp:  [98.2 F (36.8 C)] 98.2 F (36.8 C) (06/13 1534) Pulse Rate:  [55-97] 55 (06/13 2030) Resp:  [14] 14 (06/13 1830) BP: (117-143)/(71-89) 123/73 (06/13 2030) SpO2:  [92 %-100 %] 94 % (06/13 2030) Weight:  [101.6 kg (224 lb)] 101.6 kg (224 lb) (06/13 1541)   Intake/Output from previous day: No intake/output data recorded. Intake/Output this shift: No intake/output data recorded.   General appearance: alert and cooperative GI: soft, distended  Incision: healing well  Lab Results:  Recent Labs    01/02/18 1639  WBC 5.5  HGB 9.7*  HCT 31.5*  PLT 283   BMET Recent Labs    01/02/18 1639  NA 146*  K 3.6  CL 111  CO2 29  GLUCOSE 166*  BUN 9  CREATININE 1.09  CALCIUM 8.3*   PT/INR No results for input(s): LABPROT, INR in the last 72 hours. ABG No results for input(s): PHART, HCO3 in the last 72 hours.  Invalid input(s): PCO2, PO2  MEDS, Scheduled   Studies/Results: Ct Abdomen Pelvis W Contrast  Result Date: 01/02/2018 CLINICAL DATA:  Colostomy bag not emptying since yesterday afternoon. Nausea and vomiting. EXAM: CT ABDOMEN AND PELVIS WITH CONTRAST TECHNIQUE: Multidetector CT imaging of the abdomen and pelvis was performed using the standard protocol following bolus administration of intravenous contrast. CONTRAST:  147m ISOVUE-300 IOPAMIDOL (ISOVUE-300) INJECTION 61% COMPARISON:  12/11/2017 FINDINGS: Lower chest: Stable 3 mm nodule over the right middle lobe. Tiny amount of bilateral pleural fluid with associated bibasilar atelectasis. Mild cardiomegaly. Hepatobiliary: Liver and biliary tree are within normal. Gallbladder is contracted. Pancreas: Normal. Spleen: Normal. Adrenals/Urinary Tract: Adrenal glands are within normal. Absence of the left kidney.  Right kidney is normal. Right ureter and bladder are normal. Stomach/Bowel: Stomach is normal. Small bowel loops remain moderately dilated up to 4.7 cm. There is an abrupt transition within the mid small bowel over the mid abdomen just left of midline likely the site of small bowel obstruction and likely due to adhesions. A collapsed narrowed segment of colon passes directly by this transition point just due to the colostomy site in the left lower quadrant. The colostomy site in the left lower quadrant demonstrates a small amount of fluid over the dependent portion of the colostomy defect. Appendix is normal. Evidence of patient's recent partial colon resection. Hartmann's pouch present. Vascular/Lymphatic: Within normal. Reproductive: Normal. Other: Mild-to-moderate ascites. No free peritoneal air. No pneumatosis. Small amount of fluid tracking over the right inguinal canal. Musculoskeletal: Degenerative changes of the spine. IMPRESSION: Evidence patient's recent partial colon resection with colostomy over the left lower quadrant. There is a small bowel obstruction unchanged to slightly worse with transition point over the mid abdomen just left of midline likely due to adhesions. Mild to moderate ascites. Small bilateral pleural effusions with bibasilar atelectasis. Absent left kidney. Electronically Signed   By: DMarin OlpM.D.   On: 01/02/2018 19:24    Assessment: s/p  Patient Active Problem List   Diagnosis Date Noted  . Gastric ulcer without hemorrhage or perforation 12/20/2017  . Duodenal ulcer 12/20/2017  . Melena 12/20/2017  . Vitamin B12 deficiency 12/20/2017  . Acute posthemorrhagic anemia 12/19/2017  . Acute GI bleeding 12/19/2017  . Esophageal candidiasis ([place] 12/19/2017  . Hypoxia   . Low oxygen saturation   .  Colonic stricture ([place]) 12/13/2017  . Atrial fibrillation ([place]) 12/12/2017  . BPH (benign prostatic hyperplasia) 12/12/2017  . DM (diabetes mellitus) ([place]) 12/12/2017  .  HTN (hypertension) 12/12/2017  . OSA (obstructive sleep apnea) 12/12/2017  . Constipation   . Abdominal distension   . Left groin pain 04/10/2011    Early post op SBO  Plan: NPO, IVF's  Insert NG    LOS: 0 days     .Rosario Adie, MD [facility], [place]   01/02/2018 9:01 PM

## 2018-01-02 NOTE — ED Provider Notes (Signed)
[facility] DEPT Provider Note   CSN: [id number] Arrival date & time: 01/02/18  1525     History   Chief Complaint Chief Complaint  Patient presents with  . colostomy issues  . Emesis  . Bloated    HPI Manuel [name] is a 72 y.o. male.  72 year old male with prior history significant for diabetes, dyslipidemia, A. fib, and recent colostomy presents with complaint of decreased output from his colostomy.  Patient reports that over night he noticed decreased output from colostomy.  He also reports increased abdominal distention.  He also reports a vague nausea without emesis.  He denies fever.  He denies abdominal pain.   Of note patient was discharged on the third of this month after admission for pneumatosis and colonic stricture. Patient known to Dr. Leighton Ruff of [facility].   The history is provided by the patient and medical records.  Emesis   This is a new problem. The current episode started yesterday. Episode frequency: no emesis - nausea only with decreased output from colostomy  The problem has not changed since onset.There has been no fever.    Past Medical History:  Diagnosis Date  . A-fib ([place])   . BPH (benign prostatic hyperplasia)   . Diabetes mellitus   . Dizziness and giddiness   . Dyslipidemia   . Dysrhythmia   . Hypertension   . Left carotid bruit   . OSA (obstructive sleep apnea)   . S/P colonoscopy     Patient Active Problem List   Diagnosis Date Noted  . Gastric ulcer without hemorrhage or perforation 12/20/2017  . Duodenal ulcer 12/20/2017  . Melena 12/20/2017  . Vitamin B12 deficiency 12/20/2017  . Acute posthemorrhagic anemia 12/19/2017  . Acute GI bleeding 12/19/2017  . Esophageal candidiasis ([place]) 12/19/2017  . Hypoxia   . Low oxygen saturation   . Colonic stricture ([place]) 12/13/2017  . Atrial fibrillation ([place]) 12/12/2017  . BPH (benign prostatic hyperplasia) 12/12/2017  . DM (diabetes  mellitus) ([place]) 12/12/2017  . HTN (hypertension) 12/12/2017  . OSA (obstructive sleep apnea) 12/12/2017  . Constipation   . Abdominal distension   . Left groin pain 04/10/2011    Past Surgical History:  Procedure Laterality Date  . BIOPSY  12/19/2017   Procedure: BIOPSY;  Surgeon: Carol Ada, MD;  Location: [facility];  Service: Endoscopy;;  . CARDIOVERSION  12/18/2011   Procedure: CARDIOVERSION;  Surgeon: Laverda Page, MD;  Location: [facility];  Service: Cardiovascular;  Laterality: N/A;  . COLON RESECTION N/A 12/13/2017   Procedure: LAPAROSCOPIC END COLOSTOMY HARTMAN'S PROCEDURE;  Surgeon: Leighton Ruff, MD;  Location: [facility];  Service: General;  Laterality: N/A;  . CYSTOSTOMY     off back  . ESOPHAGOGASTRODUODENOSCOPY N/A 12/19/2017   Procedure: ESOPHAGOGASTRODUODENOSCOPY (EGD);  Surgeon: Carol Ada, MD;  Location: [facility];  Service: Endoscopy;  Laterality: N/A;  . FLEXIBLE SIGMOIDOSCOPY N/A 12/13/2017   Procedure: FLEXIBLE SIGMOIDOSCOPY;  Surgeon: Carol Ada, MD;  Location: [facility];  Service: Endoscopy;  Laterality: N/A;  . HERNIA REPAIR    . 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 Authorizing Provider  acetaminophen (TYLENOL) 500 MG tablet Take 500 mg by mouth every 6 (six) hours as needed for moderate pain.   Yes [provider]  budesonide-formoterol (SYMBICORT) 160-4.5 MCG/ACT inhaler  Inhale 2 puffs into the lungs 2 (two) times daily. 12/23/17  Yes Eugenie Filler, MD  diltiazem (CARDIZEM CD) 120 MG 24 hr capsule Take 120 mg by mouth daily. 10/28/17  Yes [provider]  doxazosin (CARDURA) 8 MG tablet Take 8 mg by mouth daily.   Yes [provider]  glimepiride (AMARYL) 1 MG tablet Take 1 mg by mouth daily with breakfast.   Yes [provider]  levofloxacin (LEVAQUIN) 500 MG tablet TAKE 1 TABLET BY  MOUTH EVERY DAY FOR CELLULITIS FOR 10 DAYS 12/26/17  Yes [provider]  metFORMIN (GLUCOPHAGE) 500 MG tablet Take 500 mg by mouth daily. 12/02/17  Yes [provider]  metoprolol tartrate (LOPRESSOR) 25 MG tablet Take 1 tablet (25 mg total) by mouth 2 (two) times daily. 12/23/17  Yes Eugenie Filler, MD  pravastatin (PRAVACHOL) 20 MG tablet Take 20 mg by mouth daily.  01/03/16  Yes [provider]  Probiotic Product (VISBIOME PROBIOTIC HIGH POT) CAPS TAKE 1 CAPSULE BY MOUTH EVERY DAY 12/26/17  Yes [provider]  tiotropium (SPIRIVA HANDIHALER) 18 MCG inhalation capsule Place 1 capsule (18 mcg total) into inhaler and inhale daily. 12/23/17 12/23/18 Yes Eugenie Filler, MD  cyanocobalamin (,VITAMIN B-12,) 1000 MCG/ML injection Inject 1 mL (1,000 mcg total) into the muscle daily. Take 1000 MCG's IM daily x4 days, then 1000 MCG's IM weekly x1 month, then 1000 MCG's IM monthly. 12/24/17   Eugenie Filler, MD  ELIQUIS 5 MG TABS tablet TK 1 T PO BID AFTER FOOD 01/06/18   Eugenie Filler, MD  pantoprazole (PROTONIX) 40 MG tablet Take 1 tablet (40 mg total) by mouth 2 (two) times daily. Take 1 tablet twice daily x8 weeks, and then 1 tablet daily thereafter. 12/23/17   Eugenie Filler, MD  potassium chloride SA (K-DUR,KLOR-CON) 20 MEQ tablet Take 2 tablets (40 mEq total) by mouth daily for 3 days. 12/23/17 12/26/17  Eugenie Filler, MD  Syringe/Needle, Disp, (SYRINGE 3CC/22GX1-1/2") 22G X 1-1/2" 3 ML MISC 1,000 mcg by Does not apply route daily. 12/23/17   Eugenie Filler, MD    Family History Family History  Problem Relation Age of Onset  . Cancer Father   . Alzheimer's disease Father   . Cancer Mother   . Kidney disease Paternal Aunt     Social History Social History   Tobacco Use  . Smoking status: Former Research scientist (life sciences)  . Smokeless tobacco: Never Used  . Tobacco comment: 12/2014  Substance Use Topics  . Alcohol use: No    Alcohol/week: 0.0 oz  . Drug use: No      Allergies   Sulfa antibiotics   Review of Systems Review of Systems  Gastrointestinal: Positive for abdominal distention and nausea. Negative for vomiting.       Decreased colostomy output     Physical Exam Updated Vital Signs BP (!) 141/79   Pulse 92   Temp 98.2 F (36.8 C) (Oral)   Resp 14   Ht _0  (1.905 m)   Wt 101.6 kg (224 lb)   SpO2 100%   BMI 28.00 kg/m   Physical Exam  Constitutional: He is oriented to person, place, and time. He appears well-developed and well-nourished. No distress.  HENT:  Head: Normocephalic and atraumatic.  Mouth/Throat: Oropharynx is clear and moist.  Eyes: Pupils are equal, round, and reactive to light. Conjunctivae and EOM are normal.  Neck: Normal range of motion. Neck supple.  Cardiovascular: Normal rate,  regular rhythm and normal heart sounds.  Pulmonary/Chest: Effort normal and breath sounds normal. No respiratory distress.  Abdominal: Soft. He exhibits distension. There is no tenderness.  Abdomen is mildly distended.  No appreciable tenderness.  Minimal output in colostomy bag.  Musculoskeletal: Normal range of motion. He exhibits no edema or deformity.  Neurological: He is alert and oriented to person, place, and time.  Skin: Skin is warm and dry.  Psychiatric: He has a normal mood and affect.  Nursing note and vitals reviewed.    ED Treatments / Results  Labs (all labs ordered are listed, but only abnormal results are displayed) Labs Reviewed  COMPREHENSIVE METABOLIC PANEL - Abnormal; Notable for the following components:      Result Value   Sodium 146 (*)    Glucose, Bld 166 (*)    Calcium 8.3 (*)    Total Protein 6.0 (*)    Albumin 3.1 (*)    AST 14 (*)    ALT 15 (*)    All other components within normal limits  CBC - Abnormal; Notable for the following components:   RBC 3.37 (*)    Hemoglobin 9.7 (*)    HCT 31.5 (*)    RDW 16.5 (*)    All other components within normal limits  URINALYSIS, ROUTINE  W REFLEX MICROSCOPIC - Abnormal; Notable for the following components:   Protein, ur 100 (*)    All other components within normal limits  LIPASE, BLOOD    EKG None  Radiology No results found.  Procedures Procedures (including critical care time)  Medications Ordered in ED Medications  iopamidol (ISOVUE-300) 61 % injection (has no administration in time range)  ondansetron (ZOFRAN) injection 4 mg (4 mg Intravenous Given 01/02/18 1638)  morphine 2 MG/ML injection 2 mg (2 mg Intravenous Given 01/02/18 1638)  iopamidol (ISOVUE-300) 61 % injection 100 mL (100 mLs Intravenous Contrast Given 01/02/18 1848)     Initial Impression / Assessment and Plan / ED Course  I have reviewed the triage vital signs and the nursing notes.  Pertinent labs & imaging results that were available during my care of the patient were reviewed by me and considered in my medical decision making (see chart for details).     MDM  Screen complete  Patient is presenting for evaluation of abdominal pain and distention.  CT imaging demonstrates a small bowel obstruction.  General surgery -Dr. Marcello Moores -is aware of case and will evaluate the patient for further treatment.  Hospitalist service to admit.  Family is aware of plan of care.   Hospitalist [facility])  is aware of case and will evaluate for admission.   Final Clinical Impressions(s) / ED Diagnoses   Final diagnoses:  Small bowel obstruction [facility])    ED Discharge Orders    None       Valarie Merino, MD 01/02/18 2008

## 2018-01-02 NOTE — H&P (Signed)
History and Physical    Manuel [name] QVZ:[id number] DOB: 1946/02/13 DOA: 01/02/2018  PCP: Jani Gravel, MD   Patient coming from: Home  Chief Complaint: Abdominal distention, no output from colostomy  HPI: Manuel [name] is a 72 y.o. male with medical history significant for atrial fibrillation, BPH, DM, HTN, OSA, gastric ulcer.  Patient presented to the ED with complaints of abdominal distention noticed this morning, which 2 episodes of nonbloody vomiting-assisting of recently ingested food this morning.  Patient notes since last night he has had no output from his colostomy.  Last meal was this morning-fruit.  Patient denies abdominal pain.  Recent prolonged hospital admission 5/23 to 6/3, for colonic stricture and pneumatosis of the colon, with resultant diagnostic laparoscopy and Hartman's procedure with end colostomy by Dr. Leighton Ruff.  Hospital stay was complicated by upper GI bleed after surgery,. Upper Endoscopy by Dr. Benson Norway revealed nonbleeding antral gastric ulcers and/or duodenal bulb ulcer.  Patient received 4 units PRBC prior to discharge.  Discharge hemoglobin 8.2.  ED Course: Stable vitals, hemoglobin 9.7, WBC 5.5, unremarkable lytes creatinine, lipase 18, UA - 100 protein otherwise clean.  CT abdomen and pelvis with contrast small bowel obstruction unchanged to slightly worse with transition point over the mid abdomen just left of the midline likely due to adhesions.  General surgeon Dr. Marcello Moores consulted to evaluate patient's recommended hospitalist admission.    Review of Systems: As per HPI otherwise 10 point review of systems negative.   Past Medical History:  Diagnosis Date  . A-fib ([place])   . BPH (benign prostatic hyperplasia)   . Diabetes mellitus   . Dizziness and giddiness   . Dyslipidemia   . Dysrhythmia   . Hypertension   . Left carotid bruit   . OSA (obstructive sleep apnea)   . S/P colonoscopy     Past Surgical History:  Procedure Laterality Date  .  BIOPSY  12/19/2017   Procedure: BIOPSY;  Surgeon: Carol Ada, MD;  Location: [facility];  Service: Endoscopy;;  . CARDIOVERSION  12/18/2011   Procedure: CARDIOVERSION;  Surgeon: Laverda Page, MD;  Location: [place];  Service: Cardiovascular;  Laterality: N/A;  . COLON RESECTION N/A 12/13/2017   Procedure: LAPAROSCOPIC END COLOSTOMY HARTMAN'S PROCEDURE;  Surgeon: Leighton Ruff, MD;  Location: [facility];  Service: General;  Laterality: N/A;  . CYSTOSTOMY     off back  . ESOPHAGOGASTRODUODENOSCOPY N/A 12/19/2017   Procedure: ESOPHAGOGASTRODUODENOSCOPY (EGD);  Surgeon: Carol Ada, MD;  Location: [facility];  Service: Endoscopy;  Laterality: N/A;  . FLEXIBLE SIGMOIDOSCOPY N/A 12/13/2017   Procedure: FLEXIBLE SIGMOIDOSCOPY;  Surgeon: Carol Ada, MD;  Location: [facility];  Service: Endoscopy;  Laterality: N/A;  . HERNIA REPAIR    . 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 He has never used smokeless tobacco. He reports that he does not drink alcohol or use drugs.  Allergies  Allergen Reactions  . Sulfa Antibiotics Other (See Comments)    Headaches     Family History  Problem Relation Age of Onset  . Cancer Father   . Alzheimer's disease Father   . Cancer Mother   . Kidney disease Paternal Aunt     Prior to Admission medications   Medication Sig Start Date End Date Taking? Authorizing Provider  acetaminophen (TYLENOL) 500 MG tablet Take 500 mg by mouth every 6 (six) hours as needed for moderate pain.   Yes  [provider]  budesonide-formoterol (SYMBICORT) 160-4.5 MCG/ACT inhaler Inhale 2 puffs into the lungs 2 (two) times daily. 12/23/17  Yes Eugenie Filler, MD  diltiazem (CARDIZEM CD) 120 MG 24 hr capsule Take 120 mg by mouth daily. 10/28/17  Yes [provider]  doxazosin (CARDURA) 8 MG tablet Take 8 mg by mouth daily.   Yes [provider]  glimepiride (AMARYL) 1 MG tablet Take 1 mg by mouth  daily with breakfast.   Yes [provider]  levofloxacin (LEVAQUIN) 500 MG tablet TAKE 1 TABLET BY MOUTH EVERY DAY FOR CELLULITIS FOR 10 DAYS 12/26/17  Yes [provider]  metFORMIN (GLUCOPHAGE) 500 MG tablet Take 500 mg by mouth daily. 12/02/17  Yes [provider]  metoprolol tartrate (LOPRESSOR) 25 MG tablet Take 1 tablet (25 mg total) by mouth 2 (two) times daily. 12/23/17  Yes Eugenie Filler, MD  pravastatin (PRAVACHOL) 20 MG tablet Take 20 mg by mouth daily.  01/03/16  Yes [provider]  Probiotic Product (VISBIOME PROBIOTIC HIGH POT) CAPS TAKE 1 CAPSULE BY MOUTH EVERY DAY 12/26/17  Yes [provider]  tiotropium (SPIRIVA HANDIHALER) 18 MCG inhalation capsule Place 1 capsule (18 mcg total) into inhaler and inhale daily. 12/23/17 12/23/18 Yes Eugenie Filler, MD  cyanocobalamin (,VITAMIN B-12,) 1000 MCG/ML injection Inject 1 mL (1,000 mcg total) into the muscle daily. Take 1000 MCG's IM daily x4 days, then 1000 MCG's IM weekly x1 month, then 1000 MCG's IM monthly. 12/24/17   Eugenie Filler, MD  ELIQUIS 5 MG TABS tablet TK 1 T PO BID AFTER FOOD 01/06/18   Eugenie Filler, MD  pantoprazole (PROTONIX) 40 MG tablet Take 1 tablet (40 mg total) by mouth 2 (two) times daily. Take 1 tablet twice daily x8 weeks, and then 1 tablet daily thereafter. 12/23/17   Eugenie Filler, MD  potassium chloride SA (K-DUR,KLOR-CON) 20 MEQ tablet Take 2 tablets (40 mEq total) by mouth daily for 3 days. 12/23/17 12/26/17  Eugenie Filler, MD  Syringe/Needle, Disp, (SYRINGE 3CC/22GX1-1/2") 22G X 1-1/2" 3 ML MISC 1,000 mcg by Does not apply route daily. 12/23/17   Eugenie Filler, MD    Physical Exam: Vitals:   01/02/18 1842 01/02/18 1936 01/02/18 2000 01/02/18 2030  BP:  128/81 137/73 123/73  Pulse: 97   (!) 55  Resp:      Temp:      TempSrc:      SpO2: 97%   94%  Weight:      Height:        Constitutional: NAD, calm, comfortable Vitals:   01/02/18 1842  01/02/18 1936 01/02/18 2000 01/02/18 2030  BP:  128/81 137/73 123/73  Pulse: 97   (!) 55  Resp:      Temp:      TempSrc:      SpO2: 97%   94%  Weight:      Height:       Eyes: PERRL, lids and conjunctivae normal ENMT: Mucous membranes are moist. Posterior pharynx clear of any exudate or lesions.Normal dentition.  Neck: normal, supple, no masses, no thyromegaly Respiratory: clear to auscultation bilaterally, no wheezing, no crackles. Normal respiratory effort. No accessory muscle use.  Cardiovascular: Regular rate and rhythm, no murmurs / rubs / gallops. 1+ bilateral pitting pedal edema. 2+ pedal pulses.  Abdomen: no tenderness, mildly- moderately distended, colostomy left side of abdomen, no masses palpated. No hepatosplenomegaly. Bowel sounds positive.  Musculoskeletal: no clubbing / cyanosis. No  joint deformity upper and lower extremities. Good ROM, no contractures. Normal muscle tone.  Skin: no rashes, lesions, ulcers. No induration Neurologic: CN 2-12 grossly intact.  Strength 5/5 in all 4.  Psychiatric: Normal judgment and insight. Alert and oriented x 3. Normal mood.   Labs on Admission: I have personally reviewed following labs and imaging studies  CBC: Recent Labs  Lab 01/02/18 1639  WBC 5.5  HGB 9.7*  HCT 31.5*  MCV 93.5  PLT 833   Basic Metabolic Panel: Recent Labs  Lab 01/02/18 1639  NA 146*  K 3.6  CL 111  CO2 29  GLUCOSE 166*  BUN 9  CREATININE 1.09  CALCIUM 8.3*   Liver Function Tests: Recent Labs  Lab 01/02/18 1639  AST 14*  ALT 15*  ALKPHOS 78  BILITOT 1.0  PROT 6.0*  ALBUMIN 3.1*   Recent Labs  Lab 01/02/18 1639  LIPASE 18   Urine analysis:    Component Value Date/Time   COLORURINE YELLOW 01/02/2018 1649   APPEARANCEUR CLEAR 01/02/2018 1649   LABSPEC 1.020 01/02/2018 1649   PHURINE 5.0 01/02/2018 1649   GLUCOSEU NEGATIVE 01/02/2018 1649   HGBUR NEGATIVE 01/02/2018 1649   [place] NEGATIVE 01/02/2018 1649   KETONESUR NEGATIVE  01/02/2018 1649   PROTEINUR 100 (A) 01/02/2018 1649   NITRITE NEGATIVE 01/02/2018 1649   LEUKOCYTESUR NEGATIVE 01/02/2018 1649    Radiological Exams on Admission: Ct Abdomen Pelvis W Contrast  Result Date: 01/02/2018 CLINICAL DATA:  Colostomy bag not emptying since yesterday afternoon. Nausea and vomiting. EXAM: CT ABDOMEN AND PELVIS WITH CONTRAST TECHNIQUE: Multidetector CT imaging of the abdomen and pelvis was performed using the standard protocol following bolus administration of intravenous contrast. CONTRAST:  158m ISOVUE-300 IOPAMIDOL (ISOVUE-300) INJECTION 61% COMPARISON:  12/11/2017 FINDINGS: Lower chest: Stable 3 mm nodule over the right middle lobe. Tiny amount of bilateral pleural fluid with associated bibasilar atelectasis. Mild cardiomegaly. Hepatobiliary: Liver and biliary tree are within normal. Gallbladder is contracted. Pancreas: Normal. Spleen: Normal. Adrenals/Urinary Tract: Adrenal glands are within normal. Absence of the left kidney. Right kidney is normal. Right ureter and bladder are normal. Stomach/Bowel: Stomach is normal. Small bowel loops remain moderately dilated up to 4.7 cm. There is an abrupt transition within the mid small bowel over the mid abdomen just left of midline likely the site of small bowel obstruction and likely due to adhesions. A collapsed narrowed segment of colon passes directly by this transition point just due to the colostomy site in the left lower quadrant. The colostomy site in the left lower quadrant demonstrates a small amount of fluid over the dependent portion of the colostomy defect. Appendix is normal. Evidence of patient's recent partial colon resection. Hartmann's pouch present. Vascular/Lymphatic: Within normal. Reproductive: Normal. Other: Mild-to-moderate ascites. No free peritoneal air. No pneumatosis. Small amount of fluid tracking over the right inguinal canal. Musculoskeletal: Degenerative changes of the spine. IMPRESSION: Evidence  patient's recent partial colon resection with colostomy over the left lower quadrant. There is a small bowel obstruction unchanged to slightly worse with transition point over the mid abdomen just left of midline likely due to adhesions. Mild to moderate ascites. Small bilateral pleural effusions with bibasilar atelectasis. Absent left kidney. Electronically Signed   By: DMarin OlpM.D.   On: 01/02/2018 19:24   EKG: Independently reviewed.   Assessment/Plan Principal Problem:   SBO (small bowel obstruction) (HCC) Active Problems:   Atrial fibrillation (HCC)   BPH (benign prostatic hyperplasia)   DM (diabetes mellitus) ([place]  HTN (hypertension)   OSA (obstructive sleep apnea)   Gastric ulcer without hemorrhage or perforation   Small bowel obstruction-abdominal distention, no output from colostomy.  Recent Hartman's procedure with colectomy and colostomy- 03/06/47 by Dr Leighton Ruff.  -N.p.o. -Maintenance fluids 1/2 Ns +20 KCl at 75 cc/h for 1 day -Follow-up general surgery recommendations -NG tube ordered by general surgery -BMP a.m. -Zofran, famotidine  History of gastric ulcer-hemoglobin stable today 9.7 discharge hemoglobin 8. 2. 12/19/17. EGD showed nonbleeding gastric ulcers recent admission.  Patient required 4u PRBCs prior to discharge.  -Famotidine 78m   Bilateral pitting pedal edema-patient denies lower extremity swelling prior to recent hospital admission admission.  Likely from IV fluids during admission, and reduced mobility.  No cardiac history. -Bilateral lower extremity Dopplers  HTN-blood pressure stable systolic 1185-631-While n.p.o., IV metop 54mTID  Atrial fibrillation -rate controlled, on Cardizem.  Eliquis held recent admission, patient was to resume 2 weeks after discharge 01/03/2018.  Home medications Cardizem 120 daily, metoprolol 25 twice daily. -Will Hold Eliquis for now pending surgery eval, continued stable hemoglobin -Hold home Cardizem for now -Will  continue rate control medications with scheduled IV metoprolol 51m12m8H, with holding parameters  BPH -Hold home doxazosin for now, while n.p.o.  DM-last A1c 12/19/2017- 6.4 .  Hold home metformin and glimepiride. -Monitor glucose while n.p.o. - SSI  DVT prophylaxis: Scds for now Code Status: full Family Communication: Spouse and son at bedside Disposition Plan: Per rounding team Consults called: General surgery Admission status: Inpatient MedSurg   EjiBethena Roys [facility] Pager [phone] 6PM-2AM.  Otherwise please contact night-coverage [email or web address] Password [facility]/13/2019, 8:55 PM

## 2018-01-02 NOTE — ED Triage Notes (Signed)
Patient states he has not emptied anything from colostomy bag since yesterday afternoon. Patient also c/o nausea and vomiting.

## 2018-01-02 NOTE — ED Notes (Signed)
ED TO INPATIENT HANDOFF REPORT  Name/Age/Gender Manuel [name] 72 y.o. male  Code Status Code Status History    Date Active Date Inactive Code Status Order ID Comments User Context   12/12/2017 2238 12/23/2017 2016 Full Code [id number]  Bethena Roys, MD Inpatient      Home/SNF/Other Home  Chief Complaint failure to thrive, fever, vomiting  Level of Care/Admitting Diagnosis ED Disposition    ED Disposition Condition [facility] Area: [facility] [[id number]]  Level of Care: Med-Surg [16]  Diagnosis: SBO (small bowel obstruction) [facility]) [[id number]]  Admitting Physician: Kara Pacer  Attending Physician: Bethena Roys Nessa.Cuff  Estimated length of stay: past midnight tomorrow  Certification:: I certify this patient will need inpatient services for at least 2 midnights  PT Class (Do Not Modify): Inpatient [101]  PT Acc Code (Do Not Modify): Private [1]       Medical History Past Medical History:  Diagnosis Date  . A-fib ([place])   . BPH (benign prostatic hyperplasia)   . Diabetes mellitus   . Dizziness and giddiness   . Dyslipidemia   . Dysrhythmia   . Hypertension   . Left carotid bruit   . OSA (obstructive sleep apnea)   . S/P colonoscopy     Allergies Allergies  Allergen Reactions  . Sulfa Antibiotics Other (See Comments)    Headaches     IV Location/Drains/Wounds Patient Lines/Drains/Airways Status   Active Line/Drains/Airways    Name:   Placement date:   Placement time:   Site:   Days:   Peripheral IV 01/02/18 Right Antecubital   01/02/18    1640    Antecubital   less than 1   NG/OG Tube Nasogastric 16 Fr. Left nare Aucultation   01/02/18    2142    Left nare   less than 1   Colostomy LUQ   12/13/17    1533    LUQ   20   Incision (Closed) 12/13/17 Abdomen   12/13/17    1601     20   Incision - 1 Port Abdomen Left;Lower;Lateral   12/13/17    1433     20          Labs/Imaging Results for orders  placed or performed during the hospital encounter of 01/02/18 (from the past 48 hour(s))  Lipase, blood     Status: None   Collection Time: 01/02/18  4:39 PM  Result Value Ref Range   Lipase 18 11 - 51 U/L    Comment: Performed at [facility], [place] [address] [place], [place] [address]  Comprehensive metabolic panel     Status: Abnormal   Collection Time: 01/02/18  4:39 PM  Result Value Ref Range   Sodium 146 (H) 135 - 145 mmol/L   Potassium 3.6 3.5 - 5.1 mmol/L   Chloride 111 101 - 111 mmol/L   CO2 29 22 - 32 mmol/L   Glucose, Bld 166 (H) 65 - 99 mg/dL   BUN 9 6 - 20 mg/dL   Creatinine, Ser 1.09 0.61 - 1.24 mg/dL   Calcium 8.3 (L) 8.9 - 10.3 mg/dL   Total Protein 6.0 (L) 6.5 - 8.1 g/dL   Albumin 3.1 (L) 3.5 - 5.0 g/dL   AST 14 (L) 15 - 41 U/L   ALT 15 (L) 17 - 63 U/L   Alkaline Phosphatase 78 38 - 126 U/L   Total Bilirubin 1.0 0.3 - 1.2  mg/dL   GFR calc non Af Amer >60 >60 mL/min   GFR calc Af Amer >60 >60 mL/min    Comment: (NOTE) The eGFR has been calculated using the CKD EPI equation. This calculation has not been validated in all clinical situations. eGFR's persistently <60 mL/min signify possible Chronic Kidney Disease.    Anion gap 6 5 - 15    Comment: Performed at [facility], [place] [address], [place], [place] [address]  CBC     Status: Abnormal   Collection Time: 01/02/18  4:39 PM  Result Value Ref Range   WBC 5.5 4.0 - 10.5 K/uL   RBC 3.37 (L) 4.22 - 5.81 MIL/uL   Hemoglobin 9.7 (L) 13.0 - 17.0 g/dL   HCT 31.5 (L) 39.0 - 52.0 %   MCV 93.5 78.0 - 100.0 fL   MCH 28.8 26.0 - 34.0 pg   MCHC 30.8 30.0 - 36.0 g/dL   RDW 16.5 (H) 11.5 - 15.5 %   Platelets 283 150 - 400 K/uL    Comment: Performed at [facility], [place] [address] [place], [place] [address]  Urinalysis, Routine w reflex microscopic     Status: Abnormal   Collection Time: 01/02/18  4:49 PM  Result Value Ref Range   Color, Urine YELLOW YELLOW    APPearance CLEAR CLEAR   Specific Gravity, Urine 1.020 1.005 - 1.030   pH 5.0 5.0 - 8.0   Glucose, UA NEGATIVE NEGATIVE mg/dL   Hgb urine dipstick NEGATIVE NEGATIVE   Bilirubin Urine NEGATIVE NEGATIVE   Ketones, ur NEGATIVE NEGATIVE mg/dL   Protein, ur 100 (A) NEGATIVE mg/dL   Nitrite NEGATIVE NEGATIVE   Leukocytes, UA NEGATIVE NEGATIVE   RBC / HPF 0-5 0 - 5 RBC/hpf   WBC, UA 0-5 0 - 5 WBC/hpf   Bacteria, UA NONE SEEN NONE SEEN   Mucus PRESENT    Hyaline Casts, UA PRESENT    Granular Casts, UA PRESENT     Comment: Performed at [facility], [place] [address], [place], [place] [address]   Ct Abdomen Pelvis W Contrast  Result Date: 01/02/2018 CLINICAL DATA:  Colostomy bag not emptying since yesterday afternoon. Nausea and vomiting. EXAM: CT ABDOMEN AND PELVIS WITH CONTRAST TECHNIQUE: Multidetector CT imaging of the abdomen and pelvis was performed using the standard protocol following bolus administration of intravenous contrast. CONTRAST:  178m ISOVUE-300 IOPAMIDOL (ISOVUE-300) INJECTION 61% COMPARISON:  12/11/2017 FINDINGS: Lower chest: Stable 3 mm nodule over the right middle lobe. Tiny amount of bilateral pleural fluid with associated bibasilar atelectasis. Mild cardiomegaly. Hepatobiliary: Liver and biliary tree are within normal. Gallbladder is contracted. Pancreas: Normal. Spleen: Normal. Adrenals/Urinary Tract: Adrenal glands are within normal. Absence of the left kidney. Right kidney is normal. Right ureter and bladder are normal. Stomach/Bowel: Stomach is normal. Small bowel loops remain moderately dilated up to 4.7 cm. There is an abrupt transition within the mid small bowel over the mid abdomen just left of midline likely the site of small bowel obstruction and likely due to adhesions. A collapsed narrowed segment of colon passes directly by this transition point just due to the colostomy site in the left lower quadrant. The colostomy site in the left lower quadrant  demonstrates a small amount of fluid over the dependent portion of the colostomy defect. Appendix is normal. Evidence of patient's recent partial colon resection. Hartmann's pouch present. Vascular/Lymphatic: Within normal. Reproductive: Normal. Other: Mild-to-moderate ascites. No free peritoneal air. No pneumatosis. Small amount of  fluid tracking over the right inguinal canal. Musculoskeletal: Degenerative changes of the spine. IMPRESSION: Evidence patient's recent partial colon resection with colostomy over the left lower quadrant. There is a small bowel obstruction unchanged to slightly worse with transition point over the mid abdomen just left of midline likely due to adhesions. Mild to moderate ascites. Small bilateral pleural effusions with bibasilar atelectasis. Absent left kidney. Electronically Signed   By: Marin Olp M.D.   On: 01/02/2018 19:24    Pending Labs Unresulted Labs (From admission, onward)   Start     Ordered   Signed and Held  Basic metabolic panel  Tomorrow morning,   R     Signed and Held      Vitals/Pain Today's Vitals   01/02/18 1842 01/02/18 1936 01/02/18 2000 01/02/18 2030  BP:  128/81 137/73 123/73  Pulse: 97   (!) 55  Resp:      Temp:      TempSrc:      SpO2: 97%   94%  Weight:      Height:      PainSc:        Isolation Precautions No active isolations  Medications Medications  ondansetron (ZOFRAN) injection 4 mg (4 mg Intravenous Given 01/02/18 1638)  morphine 2 MG/ML injection 2 mg (2 mg Intravenous Given 01/02/18 1638)  iopamidol (ISOVUE-300) 61 % injection (  Contrast Given 01/02/18 1851)  iopamidol (ISOVUE-300) 61 % injection 100 mL (100 mLs Intravenous Contrast Given 01/02/18 1848)    Mobility walks

## 2018-01-03 ENCOUNTER — Inpatient Hospital Stay (HOSPITAL_COMMUNITY): Payer: Medicare HMO

## 2018-01-03 DIAGNOSIS — K56609 Unspecified intestinal obstruction, unspecified as to partial versus complete obstruction: Secondary | ICD-10-CM

## 2018-01-03 DIAGNOSIS — M7989 Other specified soft tissue disorders: Secondary | ICD-10-CM

## 2018-01-03 DIAGNOSIS — I482 Chronic atrial fibrillation: Secondary | ICD-10-CM

## 2018-01-03 DIAGNOSIS — I10 Essential (primary) hypertension: Secondary | ICD-10-CM

## 2018-01-03 DIAGNOSIS — E118 Type 2 diabetes mellitus with unspecified complications: Secondary | ICD-10-CM

## 2018-01-03 LAB — GLUCOSE, CAPILLARY
GLUCOSE-CAPILLARY: 88 mg/dL (ref 65–99)
GLUCOSE-CAPILLARY: 65 mg/dL (ref 65–99)
Glucose-Capillary: 129 mg/dL — ABNORMAL HIGH (ref 65–99)
Glucose-Capillary: 67 mg/dL (ref 65–99)
Glucose-Capillary: 75 mg/dL (ref 65–99)
Glucose-Capillary: 78 mg/dL (ref 65–99)
Glucose-Capillary: 97 mg/dL (ref 65–99)

## 2018-01-03 LAB — BASIC METABOLIC PANEL
Anion gap: 4 — ABNORMAL LOW (ref 5–15)
BUN: 7 mg/dL (ref 6–20)
CO2: 32 mmol/L (ref 22–32)
CREATININE: 1.01 mg/dL (ref 0.61–1.24)
Calcium: 7.9 mg/dL — ABNORMAL LOW (ref 8.9–10.3)
Chloride: 109 mmol/L (ref 101–111)
GFR calc Af Amer: >60 mL/min (ref >60)
GLUCOSE: 103 mg/dL — AB (ref 65–99)
POTASSIUM: 3.6 mmol/L (ref 3.5–5.1)
Sodium: 145 mmol/L (ref 135–145)

## 2018-01-03 LAB — MAGNESIUM: Magnesium: 1.6 mg/dL — ABNORMAL LOW (ref 1.7–2.4)

## 2018-01-03 MED ORDER — DEXTROSE 50 % IV SOLN
INTRAVENOUS | Status: AC
  Administered 2018-01-03: 50 mL
  Filled 2018-01-03: qty 50

## 2018-01-03 MED ORDER — DIATRIZOATE MEGLUMINE & SODIUM 66-10 % PO SOLN
90.0000 mL | Freq: Once | ORAL | Status: AC
  Administered 2018-01-03: 90 mL via NASOGASTRIC
  Filled 2018-01-03: qty 90

## 2018-01-03 MED ORDER — MAGNESIUM SULFATE 2 GM/50ML IV SOLN
2.0000 g | Freq: Once | INTRAVENOUS | Status: AC
  Administered 2018-01-03: 2 g via INTRAVENOUS
  Filled 2018-01-03: qty 50

## 2018-01-03 MED ORDER — KCL IN DEXTROSE-NACL 20-5-0.45 MEQ/L-%-% IV SOLN
INTRAVENOUS | Status: DC
Start: 2018-01-03 — End: 2018-01-07
  Administered 2018-01-03 – 2018-01-07 (×6): via INTRAVENOUS
  Filled 2018-01-03 (×7): qty 1000

## 2018-01-03 MED ORDER — METOPROLOL TARTRATE 5 MG/5ML IV SOLN
10.0000 mg | Freq: Four times a day (QID) | INTRAVENOUS | Status: DC
  Administered 2018-01-04 – 2018-01-07 (×14): 10 mg via INTRAVENOUS
  Filled 2018-01-03 (×12): qty 10

## 2018-01-03 NOTE — Progress Notes (Addendum)
PROGRESS NOTE    Manuel [name]  MCE:[id number] DOB: [birth year] DOA: 01/02/2018 PCP: Jani Gravel, MD    Brief Narrative:  Patient is a 72 year old male with history of DM Type II, HLD, A.fib, and recent colostomy admitted on 6/13 for decreased colostomy output associated with nausea and vomiting. He reports no output from his colostomy bag since the evening of 6/12 while he normally has output about 4 times per day. A CT scan of the abdomen showed a SBO.General surgery is following, patient is NPO and and NGT was placed last night with 1100cc output.    Assessment & Plan:   Principal Problem:   SBO (small bowel obstruction) (HCC) Active Problems:   Atrial fibrillation (HCC)   BPH (benign prostatic hyperplasia)   DM (diabetes mellitus) (HCC)   HTN (hypertension)   OSA (obstructive sleep apnea)   Gastric ulcer without hemorrhage or perforation   Small bowel obstruction -SBO likely due to adhesions of recent abdominal surgery -CT abdomen showed SBO with transition point over the mid abdomen just left of midline likely due to adhesions, Abd X-ray shows NG tube tip in the descending duodenum and continued but improving small bowel obstruction pattern. -Patient being followed by general surgery. NGT placed 6/13 with 1100cc output providing relief and decreased distension to patient. Keep NPO. Appreciate general surgery input.  Gastric ulcer -Upper GI bleed from ulcer 12/19/17, Hgb at discharge was 8.0 and is 9.7 on admission. Continue to monitor. -Continue Famotidine   Bilateral lower extremity edema -Patient reports he did not have prior to admission, likely secondary to fluids received -LE venous duplex negative for DVT -? New CHF, cardiomegaly seen on previous chest film. Will order echocardiogram for cardiomegaly/edema  Atrial fibrillation -Eliquis was held after last admission due to upper GI bleed, was supposed to restart today but will continue to hold. Also will continue to  hold Cardizem as patient is currently well controlled -Increase Metoprolol to 55m QID for better rate control  Diabetes Mellitus Type II -Continue to monitor CBG, last readings 78 and 75 -Continue insulin sliding scale  Hypertension -Patient stable, continue to monitor BP -Increase Metoprolol to 150mQID   DVT prophylaxis:SCDs  Code Status: Full Code Family Communication: Wife, at bedside Disposition Plan: Home when improved   Consultants:   General Surgery  Procedures:     Antimicrobials:   None   Subjective: Patient reports feeling much better after NGT placement. He has decreased distension and no longer has nausea or vomiting. He reports he has not had any output to his colostomy bag since 6/12. He has a new onset of bilateral leg swelling. He denies dyspnea, orthopnea, and chest pain.   Objective: Vitals:   01/02/18 2100 01/02/18 2130 01/02/18 2241 01/03/18 0406  BP: 123/79 108/82 128/80 (!) 142/72  Pulse: 90  90 72  Resp:   18 18  Temp:   98.6 F (37 C) (!) 97.3 F (36.3 C)  TempSrc:   Oral Oral  SpO2: 98%  93% 99%  Weight:      Height:        Intake/Output Summary (Last 24 hours) at 01/03/2018 1139 Last data filed at 01/03/2018 1000 Gross per 24 hour  Intake 350 ml  Output 1100 ml  Net -750 ml   Filed Weights   01/02/18 1541  Weight: 101.6 kg (224 lb)    Examination:   General: Not in pain or dyspnea  Neurology: Awake and alert, non focal  E ENT:  NGT present, no pallor, no icterus, oral mucosa moist Cardiovascular: No JVD. Irregularly irregular. S1-S2 present, no gallops, rubs, or murmurs. Bilateral lower extremity edema. Pulmonary: vesicular breath sounds bilaterally, adequate air movement, no wheezing, rhonchi or rales. Gastrointestinal. Abdomen distended with colostomy, no organomegaly, non tender, no rebound or guarding Skin. No rashes Musculoskeletal: no joint deformities     Data Reviewed: I have personally reviewed following labs  and imaging studies  CBC: Recent Labs  Lab 01/02/18 1639  WBC 5.5  HGB 9.7*  HCT 31.5*  MCV 93.5  PLT 525   Basic Metabolic Panel: Recent Labs  Lab 01/02/18 1639 01/03/18 0513  NA 146* 145  K 3.6 3.6  CL 111 109  CO2 29 32  GLUCOSE 166* 103*  BUN 9 7  CREATININE 1.09 1.01  CALCIUM 8.3* 7.9*  MG  --  1.6*   GFR: Estimated Creatinine Clearance: 85.4 mL/min (by C-G formula based on SCr of 1.01 mg/dL). Liver Function Tests: Recent Labs  Lab 01/02/18 1639  AST 14*  ALT 15*  ALKPHOS 78  BILITOT 1.0  PROT 6.0*  ALBUMIN 3.1*   Recent Labs  Lab 01/02/18 1639  LIPASE 18   No results for input(s): AMMONIA in the last 168 hours. Coagulation Profile: No results for input(s): INR, PROTIME in the last 168 hours. Cardiac Enzymes: No results for input(s): CKTOTAL, CKMB, CKMBINDEX, TROPONINI in the last 168 hours. BNP (last 3 results) No results for input(s): PROBNP in the last 8760 hours. HbA1C: No results for input(s): HGBA1C in the last 72 hours. CBG: Recent Labs  Lab 01/02/18 2339 01/03/18 0401 01/03/18 0756  GLUCAP 97 88 78   Lipid Profile: No results for input(s): CHOL, HDL, LDLCALC, TRIG, CHOLHDL, LDLDIRECT in the last 72 hours. Thyroid Function Tests: No results for input(s): TSH, T4TOTAL, FREET4, T3FREE, THYROIDAB in the last 72 hours. Anemia Panel: No results for input(s): VITAMINB12, FOLATE, FERRITIN, TIBC, IRON, RETICCTPCT in the last 72 hours.    Radiology Studies: I have reviewed all of the imaging during this hospital visit personally     Scheduled Meds: . insulin aspart  0-9 Units Subcutaneous Q4H  . metoprolol tartrate  5 mg Intravenous Q8H  . mometasone-formoterol  2 puff Inhalation BID  . tiotropium  18 mcg Inhalation Daily   Continuous Infusions: . 0.45 % NaCl with KCl 20 mEq / L 75 mL/hr at 01/02/18 2339  . famotidine (PEPCID) IV Stopped (01/03/18 1011)     LOS: 1 day    Carita Pian, PA-S   [facility] Pager [phone]

## 2018-01-03 NOTE — Progress Notes (Signed)
[facility]  Patient Status: Active (receiving services up to time of hospitalization)  [facility] is providing the following services: RN  If patient discharges after hours, please call [phone].   [name] 01/03/2018, 9:17 AM

## 2018-01-03 NOTE — Progress Notes (Signed)
[facility] Progress Note     Subjective: CC- SBO Patient states that he is feeling better this morning. Abdominal pain improving. Denies n/v. No flatus or stool from colostomy. NG tube placed last night and has had about 500cc output.  Eliquis held at discharge after last admission due to UGI bleed, and he had not yet restarted it.  Objective: Vital signs in last 24 hours: Temp:  [97.3 F (36.3 C)-98.6 F (37 C)] 97.3 F (36.3 C) (06/14 0406) Pulse Rate:  [55-97] 72 (06/14 0406) Resp:  [14-18] 18 (06/14 0406) BP: (108-143)/(71-89) 142/72 (06/14 0406) SpO2:  [92 %-100 %] 99 % (06/14 0406) Weight:  [224 lb (101.6 kg)] 224 lb (101.6 kg) (06/13 1541) Last BM Date: 01/01/18  Intake/Output from previous day: 06/13 0701 - 06/14 0700 In: 350 [I.V.:300; IV Piggyback:50] Out: 600 [Urine:300; Emesis/NG output:300] Intake/Output this shift: No intake/output data recorded.  PE: Gen:  Alert, NAD, pleasant HEENT: EOM's intact, pupils equal and round Card:  Irregularly irregular Pulm:  CTAB, no W/R/R, effort normal Abd: Soft, mild distension, nontender, +BS, midline incision cdi with steris intact, ostomy pink with no air and trace serous fluid in pouch Psych: A&Ox3  Skin: no rashes noted, warm and dry  Lab Results:  Recent Labs    01/02/18 1639  WBC 5.5  HGB 9.7*  HCT 31.5*  PLT 283   BMET Recent Labs    01/02/18 1639 01/03/18 0513  NA 146* 145  K 3.6 3.6  CL 111 109  CO2 29 32  GLUCOSE 166* 103*  BUN 9 7  CREATININE 1.09 1.01  CALCIUM 8.3* 7.9*   PT/INR No results for input(s): LABPROT, INR in the last 72 hours. CMP     Component Value Date/Time   NA 145 01/03/2018 0513   K 3.6 01/03/2018 0513   CL 109 01/03/2018 0513   CO2 32 01/03/2018 0513   GLUCOSE 103 (H) 01/03/2018 0513   BUN 7 01/03/2018 0513   CREATININE 1.01 01/03/2018 0513   CREATININE 1.23 04/10/2011 1307   CALCIUM 7.9 (L) 01/03/2018 0513   PROT 6.0 (L) 01/02/2018 1639    ALBUMIN 3.1 (L) 01/02/2018 1639   AST 14 (L) 01/02/2018 1639   ALT 15 (L) 01/02/2018 1639   ALKPHOS 78 01/02/2018 1639   BILITOT 1.0 01/02/2018 1639   GFRNONAA >60 01/03/2018 0513   GFRAA >60 01/03/2018 0513   Lipase     Component Value Date/Time   LIPASE 18 01/02/2018 1639       Studies/Results: Ct Abdomen Pelvis W Contrast  Result Date: 01/02/2018 CLINICAL DATA:  Colostomy bag not emptying since yesterday afternoon. Nausea and vomiting. EXAM: CT ABDOMEN AND PELVIS WITH CONTRAST TECHNIQUE: Multidetector CT imaging of the abdomen and pelvis was performed using the standard protocol following bolus administration of intravenous contrast. CONTRAST:  181m ISOVUE-300 IOPAMIDOL (ISOVUE-300) INJECTION 61% COMPARISON:  12/11/2017 FINDINGS: Lower chest: Stable 3 mm nodule over the right middle lobe. Tiny amount of bilateral pleural fluid with associated bibasilar atelectasis. Mild cardiomegaly. Hepatobiliary: Liver and biliary tree are within normal. Gallbladder is contracted. Pancreas: Normal. Spleen: Normal. Adrenals/Urinary Tract: Adrenal glands are within normal. Absence of the left kidney. Right kidney is normal. Right ureter and bladder are normal. Stomach/Bowel: Stomach is normal. Small bowel loops remain moderately dilated up to 4.7 cm. There is an abrupt transition within the mid small bowel over the mid abdomen just left of midline likely the site of small bowel obstruction and likely due to adhesions.  A collapsed narrowed segment of colon passes directly by this transition point just due to the colostomy site in the left lower quadrant. The colostomy site in the left lower quadrant demonstrates a small amount of fluid over the dependent portion of the colostomy defect. Appendix is normal. Evidence of patient's recent partial colon resection. Hartmann's pouch present. Vascular/Lymphatic: Within normal. Reproductive: Normal. Other: Mild-to-moderate ascites. No free peritoneal air. No  pneumatosis. Small amount of fluid tracking over the right inguinal canal. Musculoskeletal: Degenerative changes of the spine. IMPRESSION: Evidence patient's recent partial colon resection with colostomy over the left lower quadrant. There is a small bowel obstruction unchanged to slightly worse with transition point over the mid abdomen just left of midline likely due to adhesions. Mild to moderate ascites. Small bilateral pleural effusions with bibasilar atelectasis. Absent left kidney. Electronically Signed   By: Marin Olp M.D.   On: 01/02/2018 19:24    Anti-infectives: Anti-infectives (From admission, onward)   None       Assessment/Plan A fib - last dose Eliquis 12/18/17, continue to hold DM HTN OSA BPH H/o gastric ulcer - seen on EGD 5/30  SBO -S/p Hartman's procedure with end colostomy 12/13/17 by Dr. Marcello Moores for Sigmoid stricture - CT scan 6/13 showed small bowel obstruction unchanged to slightly worse with transition point over the mid abdomen just left of midline likely due to adhesions  ID - none FEN - IVF, NPO/NGT VTE - SCDs Foley - none Follow up - Dr. Marcello Moores  Plan - Xray to check NG tube placement, then will start patient on small bowel protocol. Continue NPO/NGT to LIWS. Ok to clamp tube to allow patient to ambulate. Continue to hold eliquis.   LOS: 1 day    Wellington Hampshire , [facility] 01/03/2018, 8:12 AM Pager: [phone] Consults: [phone] Mon 7:00 am -11:30 AM Tues-Fri 7:00 am-4:30 pm Sat-Sun 7:00 am-11:30 am

## 2018-01-03 NOTE — Progress Notes (Signed)
LE venous duplex prelim: negative for DVT. Landry Mellow, RDMS, RVT

## 2018-01-04 ENCOUNTER — Inpatient Hospital Stay (HOSPITAL_COMMUNITY): Payer: Medicare HMO

## 2018-01-04 DIAGNOSIS — I361 Nonrheumatic tricuspid (valve) insufficiency: Secondary | ICD-10-CM

## 2018-01-04 HISTORY — PX: TRANSTHORACIC ECHOCARDIOGRAM: SHX275

## 2018-01-04 LAB — GLUCOSE, CAPILLARY
GLUCOSE-CAPILLARY: 72 mg/dL (ref 65–99)
Glucose-Capillary: 68 mg/dL (ref 65–99)
Glucose-Capillary: 146 mg/dL — ABNORMAL HIGH (ref 65–99)
Glucose-Capillary: 89 mg/dL (ref 65–99)
Glucose-Capillary: 70 mg/dL (ref 65–99)
Glucose-Capillary: 84 mg/dL (ref 65–99)

## 2018-01-04 LAB — BASIC METABOLIC PANEL
Anion gap: 3 — ABNORMAL LOW (ref 5–15)
BUN: 10 mg/dL (ref 6–20)
CHLORIDE: 110 mmol/L (ref 101–111)
CO2: 33 mmol/L — AB (ref 22–32)
Calcium: 8 mg/dL — ABNORMAL LOW (ref 8.9–10.3)
Creatinine, Ser: 1.16 mg/dL (ref 0.61–1.24)
GFR calc Af Amer: >60 mL/min (ref >60)
GFR calc non Af Amer: >60 mL/min (ref >60)
GLUCOSE: 117 mg/dL — AB (ref 65–99)
Potassium: 4 mmol/L (ref 3.5–5.1)
Sodium: 146 mmol/L — ABNORMAL HIGH (ref 135–145)

## 2018-01-04 LAB — CBC WITH DIFFERENTIAL/PLATELET
Basophils Absolute: 0 10*3/uL (ref 0.0–0.1)
Basophils Relative: 1 %
EOS PCT: 2 %
Eosinophils Absolute: 0.1 10*3/uL (ref 0.0–0.7)
HCT: 32.1 % — ABNORMAL LOW (ref 39.0–52.0)
Hemoglobin: 9.9 g/dL — ABNORMAL LOW (ref 13.0–17.0)
LYMPHS ABS: 0.5 10*3/uL — AB (ref 0.7–4.0)
LYMPHS PCT: 15 %
MCH: 29.7 pg (ref 26.0–34.0)
MCHC: 30.8 g/dL (ref 30.0–36.0)
MCV: 96.4 fL (ref 78.0–100.0)
MONO ABS: 0.4 10*3/uL (ref 0.1–1.0)
Monocytes Relative: 12 %
Neutro Abs: 2.2 10*3/uL (ref 1.7–7.7)
Neutrophils Relative %: 70 %
PLATELETS: 244 10*3/uL (ref 150–400)
RBC: 3.33 MIL/uL — ABNORMAL LOW (ref 4.22–5.81)
RDW: 16.7 % — ABNORMAL HIGH (ref 11.5–15.5)
WBC: 3.2 10*3/uL — ABNORMAL LOW (ref 4.0–10.5)

## 2018-01-04 LAB — ECHOCARDIOGRAM COMPLETE
Height: 75 in
Weight: 3584 oz

## 2018-01-04 LAB — MAGNESIUM: Magnesium: 2.1 mg/dL (ref 1.7–2.4)

## 2018-01-04 MED ORDER — DEXTROSE 50 % IV SOLN
INTRAVENOUS | Status: AC
  Filled 2018-01-04: qty 50

## 2018-01-04 MED ORDER — DEXTROSE 50 % IV SOLN
25.0000 g | Freq: Once | INTRAVENOUS | Status: AC
  Administered 2018-01-04: 25 g via INTRAVENOUS

## 2018-01-04 NOTE — Progress Notes (Signed)
General Surgery [facility]  Assessment & Plan: Small bowel obstruction - status post Hartman's procedure with end colostomy 12/13/17 by Dr. Marcello Moores for sigmoid stricture - CT scan 6/13 showed small bowel obstruction; AXR this AM largely unchanged - NPO, NG, IV hydration - encouraged OOB, ambulation - will repeat AXR in AM 6/17         Earnstine Regal, MD, [facility]       Office: [phone]    Chief Complaint: Small bowel obstruction  Subjective: Patient in bed, family at bedside.  No complaints.  Objective: Vital signs in last 24 hours: Temp:  [98.7 F (37.1 C)-99.3 F (37.4 C)] 99.3 F (37.4 C) (06/15 1358) Pulse Rate:  [92-103] 103 (06/15 1358) Resp:  [16-18] 18 (06/15 1358) BP: (129-144)/(91-94) 144/91 (06/15 1358) SpO2:  [88 %-97 %] 97 % (06/15 1358) Last BM Date: 01/01/18  Intake/Output from previous day: 06/14 0701 - 06/15 0700 In: 2075 [I.V.:2025; IV Piggyback:50] Out: 3200 [Emesis/NG output:3200] Intake/Output this shift: Total I/O In: 0  Out: 800 [Urine:500; Emesis/NG output:300]  Physical Exam: HEENT - sclerae clear, mucous membranes moist Neck - soft Chest - clear bilaterally Cor - RRR Abdomen - soft, protuberant; rare BS present; small gas in ostomy bag; non-tender Ext - no edema, non-tender Neuro - alert & oriented, no focal deficits  Lab Results:  Recent Labs    01/02/18 1639 01/04/18 0350  WBC 5.5 3.2*  HGB 9.7* 9.9*  HCT 31.5* 32.1*  PLT 283 244   BMET Recent Labs    01/03/18 0513 01/04/18 0350  NA 145 146*  K 3.6 4.0  CL 109 110  CO2 32 33*  GLUCOSE 103* 117*  BUN 7 10  CREATININE 1.01 1.16  CALCIUM 7.9* 8.0*   PT/INR No results for input(s): LABPROT, INR in the last 72 hours. Comprehensive Metabolic Panel:    Component Value Date/Time   NA 146 (H) 01/04/2018 0350   NA 145 01/03/2018 0513   K 4.0 01/04/2018 0350   K 3.6 01/03/2018 0513   CL 110 01/04/2018  0350   CL 109 01/03/2018 0513   CO2 33 (H) 01/04/2018 0350   CO2 32 01/03/2018 0513   BUN 10 01/04/2018 0350   BUN 7 01/03/2018 0513   CREATININE 1.16 01/04/2018 0350   CREATININE 1.01 01/03/2018 0513   CREATININE 1.23 04/10/2011 1307   GLUCOSE 117 (H) 01/04/2018 0350   GLUCOSE 103 (H) 01/03/2018 0513   CALCIUM 8.0 (L) 01/04/2018 0350   CALCIUM 7.9 (L) 01/03/2018 0513   AST 14 (L) 01/02/2018 1639   AST 17 12/12/2017 1335   ALT 15 (L) 01/02/2018 1639   ALT 15 (L) 12/12/2017 1335   ALKPHOS 78 01/02/2018 1639   ALKPHOS 70 12/12/2017 1335   BILITOT 1.0 01/02/2018 1639   BILITOT 1.4 (H) 12/12/2017 1335   PROT 6.0 (L) 01/02/2018 1639   PROT 6.4 (L) 12/12/2017 1335   ALBUMIN 3.1 (L) 01/02/2018 1639   ALBUMIN 3.3 (L) 12/12/2017 1335    Studies/Results: Ct Abdomen Pelvis W Contrast  Result Date: 01/02/2018 CLINICAL DATA:  Colostomy bag not emptying since yesterday afternoon. Nausea and vomiting. EXAM: CT ABDOMEN AND PELVIS WITH CONTRAST TECHNIQUE: Multidetector CT imaging of the abdomen and pelvis was performed using the standard protocol following bolus administration of intravenous contrast. CONTRAST:  157m ISOVUE-300 IOPAMIDOL (ISOVUE-300) INJECTION 61% COMPARISON:  12/11/2017 FINDINGS: Lower chest: Stable 3 mm nodule over the  right middle lobe. Tiny amount of bilateral pleural fluid with associated bibasilar atelectasis. Mild cardiomegaly. Hepatobiliary: Liver and biliary tree are within normal. Gallbladder is contracted. Pancreas: Normal. Spleen: Normal. Adrenals/Urinary Tract: Adrenal glands are within normal. Absence of the left kidney. Right kidney is normal. Right ureter and bladder are normal. Stomach/Bowel: Stomach is normal. Small bowel loops remain moderately dilated up to 4.7 cm. There is an abrupt transition within the mid small bowel over the mid abdomen just left of midline likely the site of small bowel obstruction and likely due to adhesions. A collapsed narrowed segment of  colon passes directly by this transition point just due to the colostomy site in the left lower quadrant. The colostomy site in the left lower quadrant demonstrates a small amount of fluid over the dependent portion of the colostomy defect. Appendix is normal. Evidence of patient's recent partial colon resection. Hartmann's pouch present. Vascular/Lymphatic: Within normal. Reproductive: Normal. Other: Mild-to-moderate ascites. No free peritoneal air. No pneumatosis. Small amount of fluid tracking over the right inguinal canal. Musculoskeletal: Degenerative changes of the spine. IMPRESSION: Evidence patient's recent partial colon resection with colostomy over the left lower quadrant. There is a small bowel obstruction unchanged to slightly worse with transition point over the mid abdomen just left of midline likely due to adhesions. Mild to moderate ascites. Small bilateral pleural effusions with bibasilar atelectasis. Absent left kidney. Electronically Signed   By: Marin Olp M.D.   On: 01/02/2018 19:24   Dg Abd 2 Views  Result Date: 01/04/2018 CLINICAL DATA:  Small-bowel obstruction EXAM: ABDOMEN - 2 VIEW COMPARISON:  01/03/2018 FINDINGS: NG has been pulled back and now is in the body the stomach. Ostomy in the left abdomen. Dilated small bowel loops with air-fluid levels in left abdomen similar to the prior study. No free air. Colon decompressed. IMPRESSION: Small-bowel obstruction pattern unchanged. NG in the body of the stomach. No free air. Electronically Signed   By: Franchot Gallo M.D.   On: 01/04/2018 09:49   Dg Abd Portable 1v-small Bowel Obstruction Protocol-initial, 8 Hr Delay  Result Date: 01/03/2018 CLINICAL DATA:  Re-evaluate small bowel obstruction. EXAM: PORTABLE ABDOMEN - 1 VIEW COMPARISON:  Plain film of earlier in the day at 0823 hours. FINDINGS: 1821 hours. Single supine portable view. Nasogastric terminating at the distal stomach. Left abdominal small bowel loop measures 4.1 cm versus  4.3 cm on the prior. Stool within the ascending colon. Contrast within the urinary bladder. No gross free intraperitoneal air. Minimal convex right lumbar spine curvature. IMPRESSION: Minimal improvement in small bowel dilatation. Electronically Signed   By: Abigail Miyamoto M.D.   On: 01/03/2018 19:55   Dg Abd Portable 1v-small Bowel Protocol-position Verification  Result Date: 01/03/2018 CLINICAL DATA:  NG tube placement EXAM: PORTABLE ABDOMEN - 1 VIEW COMPARISON:  CT 01/02/2018 FINDINGS: NG tube has been placed. The tip is in the descending duodenum. Dilated small bowel loops compatible with small bowel obstruction, improved since recent CT. No free air or organomegaly. IMPRESSION: NG tube tip in the descending duodenum. Continued but improving small bowel obstruction pattern. Electronically Signed   By: Rolm Baptise M.D.   On: 01/03/2018 08:48      Abe Schools M 01/04/2018  Patient ID: [name], male   DOB: 12-07-1945, 72 y.o.   MRN: [id number]

## 2018-01-04 NOTE — Progress Notes (Signed)
  Echocardiogram 2D Echocardiogram has been performed.  Darlina Sicilian M 01/04/2018, 8:38 AM

## 2018-01-04 NOTE — Progress Notes (Signed)
NGT was measured and it was 58 cm from tip of nose to end of tube.  Patient is alert and oriented X4.  Ambulatory and resting well.  Lungs clear.  CBG's  During the night patient had two hypoglycemic episodes without any symptoms 67 at 2030 and 68 at 0100.  D50 was given both times since patient is NPO.  At 0400 CBG was 89.  Patient denies any symptoms of hypoglycemia and denies pain .  Patient has had no BM on night shift and put out 400 cc stomach content during this 12 hour shift.

## 2018-01-04 NOTE — Progress Notes (Signed)
PROGRESS NOTE [facility]   Manuel [name]   UOR:[id number] DOB: Jul 04, 1946  DOA: 01/02/2018 PCP: Jani Gravel, MD   Brief Narrative:  Manuel [name] 72 year old male with medical history of diabetes type 2, A. Fib, recent colostomy 11/2017 due to colonic stricture and pneumatosis, hypertension BPH and OSA.  Patient presented to the emergency department complaining of abdominal distention and no output from colostomy.  Upon ED evaluation CT of the abdomen and pelvis showed small bowel obstruction with transition point over the mid abdomen just left of midline likely due to adhesions.  General surgery consulted and recommended conservative treatment with NG tube and IV fluids.  Patient admitted with working diagnosis of SBO.  Subjective: Patient seen and examined, he is doing better this morning.  Abdominal feels less distended.  Some fluid on colostomy bag.  Denies nausea vomiting.  No abdominal pain.  Output of NG tube less this morning.  Assessment & Plan: Small bowel obstruction In setting of recent Hartmann procedure with colostomy Abdominal x-ray still shows SBO with no significant changes. Continue IV fluid, n.p.o.  Encourage ambulation and clamp NG tube while walking. Defer to general surgery for further recommendations.  A. fib Rate well controlled, patient on Cardizem and metoprolol at home.  He is n.p.o. therefore on IV metoprolol for now. Hold Eliquis in view of possible procedure.  If not planning for surgical procedure within the next 24 hours will start heparin drip if he remains n.p.o.  Bilateral lower extremity pitting edema - improved Unclear etiology, however patient with history of A. fib and hypertension will need to rule out CHF. Mild cardiomegaly on CT, patient denies shortness of breath.    Echocardiogram pending,  DM type 2 controlled Patient NPO, continue SSI  Monitor CBG closely  Continue IV fluids  HTN  BP stable  On Lopressor 10 mg IV while NPO     DVT prophylaxis: SCDs Code Status: Full code Family Communication: None at bedside Disposition Plan: Home when cleared by surgery  Consultants:   General surgery  Procedures:   None  Antimicrobials:  None   Objective: Vitals:   01/03/18 1350 01/03/18 2000 01/03/18 2050 01/04/18 0550  BP: 116/83 (!) 129/93  (!) 133/94  Pulse: 100 96  92  Resp: _0 Temp: 98 F (36.7 C) 98.7 F (37.1 C)  98.9 F (37.2 C)  TempSrc: Oral Oral    SpO2: 95% (!) 88% 97%   Weight:      Height:        Intake/Output Summary (Last 24 hours) at 01/04/2018 1118 Last data filed at 01/04/2018 0852 Gross per 24 hour  Intake 2075 ml  Output 3000 ml  Net -925 ml   Filed Weights   01/02/18 1541  Weight: 101.6 kg (224 lb)    Examination:  General exam: Appears calm and comfortable  HEENT: NG tube in place Respiratory system: Clear to auscultation. No wheezes,crackle or rhonchi Cardiovascular system: S1 & S2 heard, RRR. No JVD, murmurs, rubs or gallops Gastrointestinal system: Abdomen is nondistended, soft and nontender.  Abdominal incision, clean.  Very slow bowel movement heard, colostomy bag noted with some fluid Central nervous system: Alert and oriented. No focal neurological deficits. Extremities: Bilateral lower extremity trace edema Skin: No rashes, lesions or ulcers Psychiatry: Mood & affect appropriate.    Data Reviewed: I have personally reviewed following labs and imaging studies  CBC: Recent Labs  Lab 01/02/18 1639 01/04/18 0350  WBC 5.5 3.2*  NEUTROABS  --  2.2  HGB 9.7* 9.9*  HCT 31.5* 32.1*  MCV 93.5 96.4  PLT 283 633   Basic Metabolic Panel: Recent Labs  Lab 01/02/18 1639 01/03/18 0513 01/04/18 0350  NA 146* 145 146*  K 3.6 3.6 4.0  CL 111 109 110  CO2 29 32 33*  GLUCOSE 166* 103* 117*  BUN _0 CREATININE 1.09 1.01 1.16  CALCIUM 8.3* 7.9* 8.0*  MG  --  1.6* 2.1   GFR: Estimated Creatinine Clearance: 74.3 mL/min (by C-G formula based on  SCr of 1.16 mg/dL). Liver Function Tests: Recent Labs  Lab 01/02/18 1639  AST 14*  ALT 15*  ALKPHOS 78  BILITOT 1.0  PROT 6.0*  ALBUMIN 3.1*   Recent Labs  Lab 01/02/18 1639  LIPASE 18   No results for input(s): AMMONIA in the last 168 hours. Coagulation Profile: No results for input(s): INR, PROTIME in the last 168 hours. Cardiac Enzymes: No results for input(s): CKTOTAL, CKMB, CKMBINDEX, TROPONINI in the last 168 hours. BNP (last 3 results) No results for input(s): PROBNP in the last 8760 hours. HbA1C: No results for input(s): HGBA1C in the last 72 hours. CBG: Recent Labs  Lab 01/03/18 2057 01/04/18 0104 01/04/18 0156 01/04/18 0431 01/04/18 0752  GLUCAP 129* 68 146* 89 72   Lipid Profile: No results for input(s): CHOL, HDL, LDLCALC, TRIG, CHOLHDL, LDLDIRECT in the last 72 hours. Thyroid Function Tests: No results for input(s): TSH, T4TOTAL, FREET4, T3FREE, THYROIDAB in the last 72 hours. Anemia Panel: No results for input(s): VITAMINB12, FOLATE, FERRITIN, TIBC, IRON, RETICCTPCT in the last 72 hours. Sepsis Labs: No results for input(s): PROCALCITON, LATICACIDVEN in the last 168 hours.  No results found for this or any previous visit (from the past 240 hour(s)).    Radiology Studies: Ct Abdomen Pelvis W Contrast  Result Date: 01/02/2018 CLINICAL DATA:  Colostomy bag not emptying since yesterday afternoon. Nausea and vomiting. EXAM: CT ABDOMEN AND PELVIS WITH CONTRAST TECHNIQUE: Multidetector CT imaging of the abdomen and pelvis was performed using the standard protocol following bolus administration of intravenous contrast. CONTRAST:  114m ISOVUE-300 IOPAMIDOL (ISOVUE-300) INJECTION 61% COMPARISON:  12/11/2017 FINDINGS: Lower chest: Stable 3 mm nodule over the right middle lobe. Tiny amount of bilateral pleural fluid with associated bibasilar atelectasis. Mild cardiomegaly. Hepatobiliary: Liver and biliary tree are within normal. Gallbladder is contracted.  Pancreas: Normal. Spleen: Normal. Adrenals/Urinary Tract: Adrenal glands are within normal. Absence of the left kidney. Right kidney is normal. Right ureter and bladder are normal. Stomach/Bowel: Stomach is normal. Small bowel loops remain moderately dilated up to 4.7 cm. There is an abrupt transition within the mid small bowel over the mid abdomen just left of midline likely the site of small bowel obstruction and likely due to adhesions. A collapsed narrowed segment of colon passes directly by this transition point just due to the colostomy site in the left lower quadrant. The colostomy site in the left lower quadrant demonstrates a small amount of fluid over the dependent portion of the colostomy defect. Appendix is normal. Evidence of patient's recent partial colon resection. Hartmann's pouch present. Vascular/Lymphatic: Within normal. Reproductive: Normal. Other: Mild-to-moderate ascites. No free peritoneal air. No pneumatosis. Small amount of fluid tracking over the right inguinal canal. Musculoskeletal: Degenerative changes of the spine. IMPRESSION: Evidence patient's recent partial colon resection with colostomy over the left lower quadrant. There is a small bowel obstruction unchanged to slightly worse with transition point over the mid abdomen just left  of midline likely due to adhesions. Mild to moderate ascites. Small bilateral pleural effusions with bibasilar atelectasis. Absent left kidney. Electronically Signed   By: Marin Olp M.D.   On: 01/02/2018 19:24   Dg Abd 2 Views  Result Date: 01/04/2018 CLINICAL DATA:  Small-bowel obstruction EXAM: ABDOMEN - 2 VIEW COMPARISON:  01/03/2018 FINDINGS: NG has been pulled back and now is in the body the stomach. Ostomy in the left abdomen. Dilated small bowel loops with air-fluid levels in left abdomen similar to the prior study. No free air. Colon decompressed. IMPRESSION: Small-bowel obstruction pattern unchanged. NG in the body of the stomach. No free  air. Electronically Signed   By: Franchot Gallo M.D.   On: 01/04/2018 09:49   Dg Abd Portable 1v-small Bowel Obstruction Protocol-initial, 8 Hr Delay  Result Date: 01/03/2018 CLINICAL DATA:  Re-evaluate small bowel obstruction. EXAM: PORTABLE ABDOMEN - 1 VIEW COMPARISON:  Plain film of earlier in the day at 0823 hours. FINDINGS: 1821 hours. Single supine portable view. Nasogastric terminating at the distal stomach. Left abdominal small bowel loop measures 4.1 cm versus 4.3 cm on the prior. Stool within the ascending colon. Contrast within the urinary bladder. No gross free intraperitoneal air. Minimal convex right lumbar spine curvature. IMPRESSION: Minimal improvement in small bowel dilatation. Electronically Signed   By: Abigail Miyamoto M.D.   On: 01/03/2018 19:55   Dg Abd Portable 1v-small Bowel Protocol-position Verification  Result Date: 01/03/2018 CLINICAL DATA:  NG tube placement EXAM: PORTABLE ABDOMEN - 1 VIEW COMPARISON:  CT 01/02/2018 FINDINGS: NG tube has been placed. The tip is in the descending duodenum. Dilated small bowel loops compatible with small bowel obstruction, improved since recent CT. No free air or organomegaly. IMPRESSION: NG tube tip in the descending duodenum. Continued but improving small bowel obstruction pattern. Electronically Signed   By: Rolm Baptise M.D.   On: 01/03/2018 08:48      Scheduled Meds: . dextrose      . insulin aspart  0-9 Units Subcutaneous Q4H  . metoprolol tartrate  10 mg Intravenous Q6H  . mometasone-formoterol  2 puff Inhalation BID  . tiotropium  18 mcg Inhalation Daily   Continuous Infusions: . dextrose 5 % and 0.45 % NaCl with KCl 20 mEq/L 75 mL/hr at 01/04/18 1104  . famotidine (PEPCID) IV 20 mg (01/04/18 1100)     LOS: 2 days    Time spent: Total of 25 minutes spent with pt, greater than 50% of which was spent in discussion of  treatment, counseling and coordination of care    Chipper Oman, MD Pager: Text Page via [email or web address]    If 7PM-7AM, please contact night-coverage [email or web address] 01/04/2018, 11:18 AM   Note - This record has been created using [employer]. Chart creation errors have been sought, but may not always have been located. Such creation errors do not reflect on the standard of medical care.

## 2018-01-05 ENCOUNTER — Inpatient Hospital Stay (HOSPITAL_COMMUNITY): Payer: Medicare HMO

## 2018-01-05 LAB — CBC WITH DIFFERENTIAL/PLATELET
BASOS PCT: 0 %
Basophils Absolute: 0 10*3/uL (ref 0.0–0.1)
EOS ABS: 0.1 10*3/uL (ref 0.0–0.7)
EOS PCT: 3 %
HCT: 33.5 % — ABNORMAL LOW (ref 39.0–52.0)
Hemoglobin: 10.3 g/dL — ABNORMAL LOW (ref 13.0–17.0)
Lymphocytes Relative: 14 %
Lymphs Abs: 0.5 10*3/uL — ABNORMAL LOW (ref 0.7–4.0)
MCH: 29.1 pg (ref 26.0–34.0)
MCHC: 30.7 g/dL (ref 30.0–36.0)
MCV: 94.6 fL (ref 78.0–100.0)
MONO ABS: 0.3 10*3/uL (ref 0.1–1.0)
MONOS PCT: 9 %
Neutro Abs: 2.7 10*3/uL (ref 1.7–7.7)
Neutrophils Relative %: 74 %
PLATELETS: 239 10*3/uL (ref 150–400)
RBC: 3.54 MIL/uL — ABNORMAL LOW (ref 4.22–5.81)
RDW: 16.2 % — ABNORMAL HIGH (ref 11.5–15.5)
WBC: 3.6 10*3/uL — ABNORMAL LOW (ref 4.0–10.5)

## 2018-01-05 LAB — BASIC METABOLIC PANEL
Anion gap: 6 (ref 5–15)
BUN: 9 mg/dL (ref 6–20)
CALCIUM: 8.1 mg/dL — AB (ref 8.9–10.3)
CO2: 34 mmol/L — ABNORMAL HIGH (ref 22–32)
Chloride: 104 mmol/L (ref 101–111)
Creatinine, Ser: 1.06 mg/dL (ref 0.61–1.24)
GFR calc Af Amer: >60 mL/min (ref >60)
GLUCOSE: 110 mg/dL — AB (ref 65–99)
Potassium: 3.6 mmol/L (ref 3.5–5.1)
SODIUM: 144 mmol/L (ref 135–145)

## 2018-01-05 LAB — GLUCOSE, CAPILLARY
GLUCOSE-CAPILLARY: 105 mg/dL — AB (ref 65–99)
GLUCOSE-CAPILLARY: 89 mg/dL (ref 65–99)
GLUCOSE-CAPILLARY: 89 mg/dL (ref 65–99)
Glucose-Capillary: 98 mg/dL (ref 65–99)
Glucose-Capillary: 101 mg/dL — ABNORMAL HIGH (ref 65–99)

## 2018-01-05 LAB — PROTIME-INR
INR: 1.25
PROTHROMBIN TIME: 15.6 s — AB (ref 11.4–15.2)

## 2018-01-05 LAB — APTT: APTT: 36 s (ref 24–36)

## 2018-01-05 LAB — MAGNESIUM: MAGNESIUM: 1.8 mg/dL (ref 1.7–2.4)

## 2018-01-05 MED ORDER — MAGNESIUM SULFATE 2 GM/50ML IV SOLN
2.0000 g | Freq: Once | INTRAVENOUS | Status: AC
  Administered 2018-01-05: 2 g via INTRAVENOUS
  Filled 2018-01-05: qty 50

## 2018-01-05 MED ORDER — POTASSIUM CHLORIDE 10 MEQ/100ML IV SOLN
10.0000 meq | INTRAVENOUS | Status: AC
  Administered 2018-01-05 (×2): 10 meq via INTRAVENOUS
  Filled 2018-01-05 (×2): qty 100

## 2018-01-05 MED ORDER — HEPARIN (PORCINE) IN NACL 100-0.45 UNIT/ML-% IJ SOLN
1500.0000 [IU]/h | INTRAMUSCULAR | Status: DC
  Administered 2018-01-05: 1500 [IU]/h via INTRAVENOUS
  Filled 2018-01-05 (×2): qty 250

## 2018-01-05 NOTE — Progress Notes (Signed)
Patient ID: Manuel [name], male   DOB: 1946-06-26, 72 y.o.   MRN: [id number] [facility] Surgery Progress Note:   * No surgery found *  Subjective: Mental status is clear.  He states that he is feeling much better (NG in place) Objective: Vital signs in last 24 hours: Temp:  [98.3 F (36.8 C)-99.3 F (37.4 C)] 98.7 F (37.1 C) (06/16 0608) Pulse Rate:  [68-103] 91 (06/16 0608) Resp:  [17-18] 17 (06/16 0608) BP: (135-152)/(76-91) 135/85 (06/16 0608) SpO2:  [85 %-97 %] 85 % (06/16 0840)  Intake/Output from previous day: 06/15 0701 - 06/16 0700 In: 1976.3 [I.V.:1817.9; IV Piggyback:158.3] Out: 1700 [Urine:800; Emesis/NG output:600; Stool:300] Intake/Output this shift: Total I/O In: 120 [P.O.:120] Out: -   Physical Exam: Work of breathing is not labored.  Incision with steristrips;  Ostomy with some soft brown stool in the bag with some gas.  Xray today shows some air fluid levels in the left upper quadrant.    Lab Results:  Results for orders placed or performed during the hospital encounter of 01/02/18 (from the past 48 hour(s))  Glucose, capillary     Status: None   Collection Time: 01/03/18 11:57 AM  Result Value Ref Range   Glucose-Capillary 75 65 - 99 mg/dL  Glucose, capillary     Status: None   Collection Time: 01/03/18  4:48 PM  Result Value Ref Range   Glucose-Capillary 65 65 - 99 mg/dL  Glucose, capillary     Status: None   Collection Time: 01/03/18  8:09 PM  Result Value Ref Range   Glucose-Capillary 67 65 - 99 mg/dL  Glucose, capillary     Status: Abnormal   Collection Time: 01/03/18  8:57 PM  Result Value Ref Range   Glucose-Capillary 129 (H) 65 - 99 mg/dL  Glucose, capillary     Status: None   Collection Time: 01/04/18  1:04 AM  Result Value Ref Range   Glucose-Capillary 68 65 - 99 mg/dL  Glucose, capillary     Status: Abnormal   Collection Time: 01/04/18  1:56 AM  Result Value Ref Range   Glucose-Capillary 146 (H) 65 - 99 mg/dL  Basic metabolic  panel     Status: Abnormal   Collection Time: 01/04/18  3:50 AM  Result Value Ref Range   Sodium 146 (H) 135 - 145 mmol/L   Potassium 4.0 3.5 - 5.1 mmol/L   Chloride 110 101 - 111 mmol/L   CO2 33 (H) 22 - 32 mmol/L   Glucose, Bld 117 (H) 65 - 99 mg/dL   BUN 10 6 - 20 mg/dL   Creatinine, Ser 1.16 0.61 - 1.24 mg/dL   Calcium 8.0 (L) 8.9 - 10.3 mg/dL   GFR calc non Af Amer >60 >60 mL/min   GFR calc Af Amer >60 >60 mL/min    Comment: (NOTE) The eGFR has been calculated using the CKD EPI equation. This calculation has not been validated in all clinical situations. eGFR's persistently <60 mL/min signify possible Chronic Kidney Disease.    Anion gap 3 (L) 5 - 15    Comment: Performed at [facility], [place] [address] [place], [place] [address]  Magnesium     Status: None   Collection Time: 01/04/18  3:50 AM  Result Value Ref Range   Magnesium 2.1 1.7 - 2.4 mg/dL    Comment: Performed at [facility], [place] [address], [place], [place] [address]  CBC with Differential/Platelet     Status: Abnormal  Collection Time: 01/04/18  3:50 AM  Result Value Ref Range   WBC 3.2 (L) 4.0 - 10.5 K/uL   RBC 3.33 (L) 4.22 - 5.81 MIL/uL   Hemoglobin 9.9 (L) 13.0 - 17.0 g/dL   HCT 32.1 (L) 39.0 - 52.0 %   MCV 96.4 78.0 - 100.0 fL   MCH 29.7 26.0 - 34.0 pg   MCHC 30.8 30.0 - 36.0 g/dL   RDW 16.7 (H) 11.5 - 15.5 %   Platelets 244 150 - 400 K/uL   Neutrophils Relative % 70 %   Neutro Abs 2.2 1.7 - 7.7 K/uL   Lymphocytes Relative 15 %   Lymphs Abs 0.5 (L) 0.7 - 4.0 K/uL   Monocytes Relative 12 %   Monocytes Absolute 0.4 0.1 - 1.0 K/uL   Eosinophils Relative 2 %   Eosinophils Absolute 0.1 0.0 - 0.7 K/uL   Basophils Relative 1 %   Basophils Absolute 0.0 0.0 - 0.1 K/uL    Comment: Performed at [facility], [address] [place], [place] [address]  Glucose, capillary     Status: None   Collection Time: 01/04/18  4:31 AM  Result Value Ref Range    Glucose-Capillary 89 65 - 99 mg/dL  Glucose, capillary     Status: None   Collection Time: 01/04/18  7:52 AM  Result Value Ref Range   Glucose-Capillary 72 65 - 99 mg/dL  Glucose, capillary     Status: None   Collection Time: 01/04/18 12:00 PM  Result Value Ref Range   Glucose-Capillary 70 65 - 99 mg/dL  Glucose, capillary     Status: None   Collection Time: 01/04/18  7:54 PM  Result Value Ref Range   Glucose-Capillary 84 65 - 99 mg/dL  Glucose, capillary     Status: None   Collection Time: 01/05/18 12:24 AM  Result Value Ref Range   Glucose-Capillary 89 65 - 99 mg/dL  Glucose, capillary     Status: None   Collection Time: 01/05/18  5:07 AM  Result Value Ref Range   Glucose-Capillary 89 65 - 99 mg/dL  Basic metabolic panel     Status: Abnormal   Collection Time: 01/05/18  8:11 AM  Result Value Ref Range   Sodium 144 135 - 145 mmol/L   Potassium 3.6 3.5 - 5.1 mmol/L   Chloride 104 101 - 111 mmol/L   CO2 34 (H) 22 - 32 mmol/L   Glucose, Bld 110 (H) 65 - 99 mg/dL   BUN 9 6 - 20 mg/dL   Creatinine, Ser 1.06 0.61 - 1.24 mg/dL   Calcium 8.1 (L) 8.9 - 10.3 mg/dL   GFR calc non Af Amer >60 >60 mL/min   GFR calc Af Amer >60 >60 mL/min    Comment: (NOTE) The eGFR has been calculated using the CKD EPI equation. This calculation has not been validated in all clinical situations. eGFR's persistently <60 mL/min signify possible Chronic Kidney Disease.    Anion gap 6 5 - 15    Comment: Performed at [facility], [address] [place], [place] [address]  Magnesium     Status: None   Collection Time: 01/05/18  8:11 AM  Result Value Ref Range   Magnesium 1.8 1.7 - 2.4 mg/dL    Comment: Performed at [facility], [address] [place], [place] [address]  CBC with Differential/Platelet     Status: Abnormal   Collection Time: 01/05/18  8:11 AM  Result Value Ref   Range   WBC 3.6 (L) 4.0 - 10.5 K/uL   RBC 3.54 (L) 4.22 - 5.81 MIL/uL    Hemoglobin 10.3 (L) 13.0 - 17.0 g/dL   HCT 33.5 (L) 39.0 - 52.0 %   MCV 94.6 78.0 - 100.0 fL   MCH 29.1 26.0 - 34.0 pg   MCHC 30.7 30.0 - 36.0 g/dL   RDW 16.2 (H) 11.5 - 15.5 %   Platelets 239 150 - 400 K/uL   Neutrophils Relative % 74 %   Neutro Abs 2.7 1.7 - 7.7 K/uL   Lymphocytes Relative 14 %   Lymphs Abs 0.5 (L) 0.7 - 4.0 K/uL   Monocytes Relative 9 %   Monocytes Absolute 0.3 0.1 - 1.0 K/uL   Eosinophils Relative 3 %   Eosinophils Absolute 0.1 0.0 - 0.7 K/uL   Basophils Relative 0 %   Basophils Absolute 0.0 0.0 - 0.1 K/uL    Comment: Performed at [facility], [place] [address], [place], [place] [address]    Radiology/Results: Dg Abd 2 Views  Result Date: 01/04/2018 CLINICAL DATA:  Small-bowel obstruction EXAM: ABDOMEN - 2 VIEW COMPARISON:  01/03/2018 FINDINGS: NG has been pulled back and now is in the body the stomach. Ostomy in the left abdomen. Dilated small bowel loops with air-fluid levels in left abdomen similar to the prior study. No free air. Colon decompressed. IMPRESSION: Small-bowel obstruction pattern unchanged. NG in the body of the stomach. No free air. Electronically Signed   By: Franchot Gallo M.D.   On: 01/04/2018 09:49   Dg Abd Portable 1v-small Bowel Obstruction Protocol-initial, 8 Hr Delay  Result Date: 01/03/2018 CLINICAL DATA:  Re-evaluate small bowel obstruction. EXAM: PORTABLE ABDOMEN - 1 VIEW COMPARISON:  Plain film of earlier in the day at 0823 hours. FINDINGS: 1821 hours. Single supine portable view. Nasogastric terminating at the distal stomach. Left abdominal small bowel loop measures 4.1 cm versus 4.3 cm on the prior. Stool within the ascending colon. Contrast within the urinary bladder. No gross free intraperitoneal air. Minimal convex right lumbar spine curvature. IMPRESSION: Minimal improvement in small bowel dilatation. Electronically Signed   By: Abigail Miyamoto M.D.   On: 01/03/2018 19:55    Anti-infectives: Anti-infectives (From  admission, onward)   None      Assessment/Plan: Problem List: Patient Active Problem List   Diagnosis Date Noted  . Small bowel obstruction ([place]) 01/02/2018  . Gastric ulcer without hemorrhage or perforation 12/20/2017  . Duodenal ulcer 12/20/2017  . Melena 12/20/2017  . Vitamin B12 deficiency 12/20/2017  . Acute posthemorrhagic anemia 12/19/2017  . Acute GI bleeding 12/19/2017  . Esophageal candidiasis ([place]) 12/19/2017  . Hypoxia   . Low oxygen saturation   . Colonic stricture ([place]) 12/13/2017  . Atrial fibrillation ([place]) 12/12/2017  . BPH (benign prostatic hyperplasia) 12/12/2017  . DM (diabetes mellitus) ([place]) 12/12/2017  . HTN (hypertension) 12/12/2017  . OSA (obstructive sleep apnea) 12/12/2017  . Constipation   . Abdominal distension   . Left groin pain 04/10/2011    Will keep NG tube for now.  Optimistic with ostomy output that this SBO/ileus is resolving.  * No surgery found *    LOS: 3 days   Matt B. Hassell Done, MD, [facility] [phone] beeper [phone]  01/05/2018 9:58 AM

## 2018-01-05 NOTE — Progress Notes (Signed)
ANTICOAGULATION CONSULT NOTE - Initial Consult  Pharmacy Consult for IV heparin Indication: atrial fibrillation (apixaban on hold)  Allergies  Allergen Reactions  . Sulfa Antibiotics Other (See Comments)    Headaches     Patient Measurements: Height: _0  (190.5 cm) Weight: 224 lb (101.6 kg) IBW/kg (Calculated) : 84.5 Heparin Dosing Weight: actual body weight  Vital Signs: Temp: 98.7 F (37.1 C) (06/16 0608) Temp Source: Oral (06/16 0608) BP: 135/85 (06/16 0608) Pulse Rate: 91 (06/16 0608)  Labs: Recent Labs    01/02/18 1639 01/03/18 0513 01/04/18 0350 01/05/18 0811  HGB 9.7*  --  9.9* 10.3*  HCT 31.5*  --  32.1* 33.5*  PLT 283  --  244 239  CREATININE 1.09 1.01 1.16 1.06    Estimated Creatinine Clearance: 81.3 mL/min (by C-G formula based on SCr of 1.06 mg/dL).   Medical History: Past Medical History:  Diagnosis Date  . A-fib ([place])   . BPH (benign prostatic hyperplasia)   . Diabetes mellitus   . Dizziness and giddiness   . Dyslipidemia   . Dysrhythmia   . Hypertension   . Left carotid bruit   . OSA (obstructive sleep apnea)   . S/P colonoscopy     Assessment: 2 y/oM with PMH of a-fib on chronic apixaban therapy recently admitted for colonic stricture and pneumatosis of the colon, s/p Hartman's procedure with end colostomy on 12/13/17. Hospital course complicated by upper GI bleed. Last dose of apixaban was 12/18/17 at 2101. Apixaban was held at discharge, was to resume on 01/06/18 per med rec. Patient, however, came to ED on 01/02/18 with complaints of abdominal distention and no output from colostomy, found to have SBO. Pharmacy consulted today for heparin infusion while patient NPO (and apixaban on hold). CBC reveals Hgb 10.3, Pltc WNL.   Goal of Therapy:  Heparin level 0.3-0.5 units/ml (targeting lower end of therapeutic range given recent bleed per discussion with Dr. Quincy Simmonds) Monitor platelets by anticoagulation protocol: Yes   Plan:   Baseline aPTT,  PT/INR STAT  Start heparin infusion at 1500 units/hr (no bolus, per discussion with Dr. Quincy Simmonds, in light of recent bleed)  Heparin level 8 hours after heparin infusion initiated  Daily CBC and heparin level  Monitor closely for s/sx of bleeding   Lindell Spar, PharmD, BCPS Pager: [phone] 01/05/2018 11:19 AM

## 2018-01-05 NOTE — Progress Notes (Addendum)
PROGRESS NOTE [facility]   Manuel [name]   EUM:[id number] DOB: 1946/05/01  DOA: 01/02/2018 PCP: Jani Gravel, MD   Brief Narrative:  Manuel [name] 72 year old male with medical history of diabetes type 2, A. Fib, recent colostomy 11/2017 due to colonic stricture and pneumatosis, hypertension BPH and OSA.  Patient presented to the emergency department complaining of abdominal distention and no output from colostomy.  Upon ED evaluation CT of the abdomen and pelvis showed small bowel obstruction with transition point over the mid abdomen just left of midline likely due to adhesions.  General surgery consulted and recommended conservative treatment with NG tube and IV fluids.  Patient admitted with working diagnosis of SBO.  Subjective: Patient feeling much better, NG tube in place.  Abdomen less distended, soft brown stool in colostomy bag.  X-ray show mild improvement.  Assessment & Plan: Small bowel obstruction In setting of recent Hartmann procedure with colostomy Abdominal x-ray from today showing mild improvement, with stool in colostomy bag hopefully SBO resolving.  Continue NG tube for now. Continue IV fluid, n.p.o.  Encourage ambulation and clamp NG tube while walking. Keep K > 4 and Mag > 2  A. Fib - remains stable Rate well controlled, patient on Cardizem and metoprolol at home.  He is n.p.o. therefore on IV metoprolol. Patient was on Eliquis at home, in view of that surgical procedure is less likely to be plan, given stools in colostomy bag will start heparin drip for now as patient remains n.p.o.  Bilateral lower extremity pitting edema In setting of diastolic dysfunction, not on acute exacerbation   Mild cardiomegaly on CT.  Leg elevation,TED hose   New diagnosis diastolic CHF, not acute  Monitor for signs of fluids overload, patient on IVF  Low salt diet  Monitor I&O's and daily weight  May need Lasix if LE edema does not improve  DM type 2 controlled Patient  NPO, continue SSI  Monitor CBG closely  Continue IV fluids  HTN  BP stable  On Lopressor 10 mg IV while NPO   DVT prophylaxis: SCDs Code Status: Full code Family Communication: None at bedside Disposition Plan: Home when cleared by surgery  Consultants:   General surgery  Procedures:   None  Antimicrobials:  None   Objective: Vitals:   01/04/18 2208 01/05/18 0042 01/05/18 0608 01/05/18 0840  BP:  (!) 152/76 135/85   Pulse:   91   Resp:   17   Temp:   98.7 F (37.1 C)   TempSrc:   Oral   SpO2: 95%  97% (!) 85%  Weight:      Height:        Intake/Output Summary (Last 24 hours) at 01/05/2018 1016 Last data filed at 01/05/2018 1000 Gross per 24 hour  Intake 2096.25 ml  Output 1650 ml  Net 446.25 ml   Filed Weights   01/02/18 1541  Weight: 101.6 kg (224 lb)    Examination:  General: Pt is alert, awake, not in acute distress, NG tube in place Cardiovascular: RRR, S1/S2 +, no rubs, no gallops Respiratory: CTA bilaterally, no wheezing, no rhonchi Abdominal: Soft, nontender, nondistended, colostomy bag with brown soft stool, positive bowel sounds Extremities: Bilateral lower extremity trace edema  Data Reviewed: I have personally reviewed following labs and imaging studies  CBC: Recent Labs  Lab 01/02/18 1639 01/04/18 0350 01/05/18 0811  WBC 5.5 3.2* 3.6*  NEUTROABS  --  2.2 2.7  HGB 9.7* 9.9* 10.3*  HCT 31.5* 32.1*  33.5*  MCV 93.5 96.4 94.6  PLT 283 244 007   Basic Metabolic Panel: Recent Labs  Lab 01/02/18 1639 01/03/18 0513 01/04/18 0350 01/05/18 0811  NA 146* 145 146* 144  K 3.6 3.6 4.0 3.6  CL 111 109 110 104  CO2 29 32 33* 34*  GLUCOSE 166* 103* 117* 110*  BUN _0 CREATININE 1.09 1.01 1.16 1.06  CALCIUM 8.3* 7.9* 8.0* 8.1*  MG  --  1.6* 2.1 1.8   GFR: Estimated Creatinine Clearance: 81.3 mL/min (by C-G formula based on SCr of 1.06 mg/dL). Liver Function Tests: Recent Labs  Lab 01/02/18 1639  AST 14*  ALT 15*    ALKPHOS 78  BILITOT 1.0  PROT 6.0*  ALBUMIN 3.1*   Recent Labs  Lab 01/02/18 1639  LIPASE 18   No results for input(s): AMMONIA in the last 168 hours. Coagulation Profile: No results for input(s): INR, PROTIME in the last 168 hours. Cardiac Enzymes: No results for input(s): CKTOTAL, CKMB, CKMBINDEX, TROPONINI in the last 168 hours. BNP (last 3 results) No results for input(s): PROBNP in the last 8760 hours. HbA1C: No results for input(s): HGBA1C in the last 72 hours. CBG: Recent Labs  Lab 01/04/18 0752 01/04/18 1200 01/04/18 1954 01/05/18 0024 01/05/18 0507  GLUCAP 72 70 84 89 89   Lipid Profile: No results for input(s): CHOL, HDL, LDLCALC, TRIG, CHOLHDL, LDLDIRECT in the last 72 hours. Thyroid Function Tests: No results for input(s): TSH, T4TOTAL, FREET4, T3FREE, THYROIDAB in the last 72 hours. Anemia Panel: No results for input(s): VITAMINB12, FOLATE, FERRITIN, TIBC, IRON, RETICCTPCT in the last 72 hours. Sepsis Labs: No results for input(s): PROCALCITON, LATICACIDVEN in the last 168 hours.  No results found for this or any previous visit (from the past 240 hour(s)).    Radiology Studies: Dg Abd 2 Views  Result Date: 01/05/2018 CLINICAL DATA:  Small bowel obstruction.  NG tube in place. EXAM: ABDOMEN - 2 VIEW COMPARISON:  Plain films the abdomen 01/04/2018. FINDINGS: NG tube remains in place. Gaseous distention of small bowel with loops measuring up to 4.5 cm has mildly improved from 5.1 cm. Air-fluid levels are seen in small bowel. No free intraperitoneal air is identified. IMPRESSION: Mild improvement in gaseous distention of small bowel consistent with small bowel obstruction. Electronically Signed   By: Inge Rise M.D.   On: 01/05/2018 09:59   Dg Abd 2 Views  Result Date: 01/04/2018 CLINICAL DATA:  Small-bowel obstruction EXAM: ABDOMEN - 2 VIEW COMPARISON:  01/03/2018 FINDINGS: NG has been pulled back and now is in the body the stomach. Ostomy in the left  abdomen. Dilated small bowel loops with air-fluid levels in left abdomen similar to the prior study. No free air. Colon decompressed. IMPRESSION: Small-bowel obstruction pattern unchanged. NG in the body of the stomach. No free air. Electronically Signed   By: Franchot Gallo M.D.   On: 01/04/2018 09:49   Dg Abd Portable 1v-small Bowel Obstruction Protocol-initial, 8 Hr Delay  Result Date: 01/03/2018 CLINICAL DATA:  Re-evaluate small bowel obstruction. EXAM: PORTABLE ABDOMEN - 1 VIEW COMPARISON:  Plain film of earlier in the day at 0823 hours. FINDINGS: 1821 hours. Single supine portable view. Nasogastric terminating at the distal stomach. Left abdominal small bowel loop measures 4.1 cm versus 4.3 cm on the prior. Stool within the ascending colon. Contrast within the urinary bladder. No gross free intraperitoneal air. Minimal convex right lumbar spine curvature. IMPRESSION: Minimal improvement in small bowel dilatation. Electronically Signed  By: Abigail Miyamoto M.D.   On: 01/03/2018 19:55      Scheduled Meds: . insulin aspart  0-9 Units Subcutaneous Q4H  . metoprolol tartrate  10 mg Intravenous Q6H  . mometasone-formoterol  2 puff Inhalation BID  . tiotropium  18 mcg Inhalation Daily   Continuous Infusions: . dextrose 5 % and 0.45 % NaCl with KCl 20 mEq/L 100 mL/hr at 01/05/18 0832  . famotidine (PEPCID) IV 20 mg (01/05/18 0959)     LOS: 3 days    Time spent: Total of 25 minutes spent with pt, greater than 50% of which was spent in discussion of  treatment, counseling and coordination of care    Chipper Oman, MD Pager: Text Page via [email or web address]   If 7PM-7AM, please contact night-coverage [email or web address] 01/05/2018, 10:16 AM   Note - This record has been created using [employer]. Chart creation errors have been sought, but may not always have been located. Such creation errors do not reflect on the standard of medical care.

## 2018-01-06 ENCOUNTER — Inpatient Hospital Stay (HOSPITAL_COMMUNITY): Payer: Medicare HMO

## 2018-01-06 LAB — CBC
HCT: 31.9 % — ABNORMAL LOW (ref 39.0–52.0)
HEMOGLOBIN: 9.8 g/dL — AB (ref 13.0–17.0)
MCH: 28.8 pg (ref 26.0–34.0)
MCHC: 30.7 g/dL (ref 30.0–36.0)
MCV: 93.8 fL (ref 78.0–100.0)
Platelets: 246 10*3/uL (ref 150–400)
RBC: 3.4 MIL/uL — AB (ref 4.22–5.81)
RDW: 16.2 % — ABNORMAL HIGH (ref 11.5–15.5)
WBC: 4.2 10*3/uL (ref 4.0–10.5)

## 2018-01-06 LAB — BASIC METABOLIC PANEL
ANION GAP: 6 (ref 5–15)
BUN: 7 mg/dL (ref 6–20)
CO2: 31 mmol/L (ref 22–32)
Calcium: 7.8 mg/dL — ABNORMAL LOW (ref 8.9–10.3)
Chloride: 106 mmol/L (ref 101–111)
Creatinine, Ser: 0.86 mg/dL (ref 0.61–1.24)
GFR calc Af Amer: >60 mL/min (ref >60)
GFR calc non Af Amer: >60 mL/min (ref >60)
GLUCOSE: 119 mg/dL — AB (ref 65–99)
Potassium: 3.8 mmol/L (ref 3.5–5.1)
Sodium: 143 mmol/L (ref 135–145)

## 2018-01-06 LAB — GLUCOSE, CAPILLARY
GLUCOSE-CAPILLARY: 103 mg/dL — AB (ref 65–99)
GLUCOSE-CAPILLARY: 112 mg/dL — AB (ref 65–99)
GLUCOSE-CAPILLARY: 133 mg/dL — AB (ref 65–99)
GLUCOSE-CAPILLARY: 93 mg/dL (ref 65–99)
Glucose-Capillary: 122 mg/dL — ABNORMAL HIGH (ref 65–99)
Glucose-Capillary: 110 mg/dL — ABNORMAL HIGH (ref 65–99)

## 2018-01-06 LAB — MAGNESIUM: Magnesium: 2 mg/dL (ref 1.7–2.4)

## 2018-01-06 LAB — HEPARIN LEVEL (UNFRACTIONATED)
Heparin Unfractionated: <0.1 IU/mL — ABNORMAL LOW (ref 0.30–0.70)
Heparin Unfractionated: 0.21 IU/mL — ABNORMAL LOW (ref 0.30–0.70)
Heparin Unfractionated: 0.2 IU/mL — ABNORMAL LOW (ref 0.30–0.70)

## 2018-01-06 MED ORDER — HEPARIN (PORCINE) IN NACL 100-0.45 UNIT/ML-% IJ SOLN
1950.0000 [IU]/h | INTRAMUSCULAR | Status: DC
  Filled 2018-01-06 (×2): qty 250

## 2018-01-06 MED ORDER — HEPARIN (PORCINE) IN NACL 100-0.45 UNIT/ML-% IJ SOLN
1800.0000 [IU]/h | INTRAMUSCULAR | Status: DC
  Administered 2018-01-06: 1800 [IU]/h via INTRAVENOUS
  Filled 2018-01-06 (×2): qty 250

## 2018-01-06 MED ORDER — HEPARIN (PORCINE) IN NACL 100-0.45 UNIT/ML-% IJ SOLN
2100.0000 [IU]/h | INTRAMUSCULAR | Status: DC
  Administered 2018-01-06: 2100 [IU]/h via INTRAVENOUS
  Filled 2018-01-06 (×2): qty 250

## 2018-01-06 NOTE — Progress Notes (Signed)
PROGRESS NOTE [facility]   Manuel [name]   XVQ:[id number] DOB: 1946-06-28  DOA: 01/02/2018 PCP: Jani Gravel, MD   Brief Narrative:  Manuel [name] 72 year old male with medical history of diabetes type 2, A. Fib, recent colostomy 11/2017 due to colonic stricture and pneumatosis, hypertension BPH and OSA.  Patient presented to the emergency department complaining of abdominal distention and no output from colostomy.  Upon ED evaluation CT of the abdomen and pelvis showed small bowel obstruction with transition point over the mid abdomen just left of midline likely due to adhesions.  General surgery consulted and recommended conservative treatment with NG tube and IV fluids.  Patient admitted with working diagnosis of SBO.  Subjective: Patient seen and examined, have no complaints, Colostomy bag with stools, no more nausea or vomiting. Surgical team clamped NGT this AM. Afebrile. No acute events overnight.   Assessment & Plan: Small bowel obstruction In setting of recent Hartmann procedure with colostomy Abdominal x-ray from today showing mild improvement, with stool in colostomy bag hopefully SBO resolving.  NG tube clamped, patient tolerated well.  Advance diet as tolerated.  Encourage ambulation.  A. Fib - remains stable Rate well controlled, patient on Cardizem and metoprolol at home.  IV metoprolol and rate stable.  Will resume oral home medications in a.m. if continues to tolerate oral diet.  Bilateral lower extremity pitting edema - improving In setting of diastolic dysfunction, not on acute exacerbation   Mild cardiomegaly on CT.  Leg elevation,TED hose   New diagnosis diastolic CHF, not acute  Monitor for signs of fluids overload, patient on IVF  Low salt diet  Monitor I&O's and daily weight   DM type 2 controlled SSI Monitor CBG closely  Continue IV fluids  HTN  BP stable  On Lopressor 10 mg IV while NPO   DVT prophylaxis: SCDs Code Status: Full  code Family Communication: None at bedside Disposition Plan: Home when cleared by surgery  Consultants:   General surgery  Procedures:   None  Antimicrobials:  None   Objective: Vitals:   01/06/18 0007 01/06/18 0606 01/06/18 0647 01/06/18 0932  BP: 137/90 (!) 142/96 139/90   Pulse: 88 96    Resp:      Temp:  98.9 F (37.2 C)    TempSrc:  Oral    SpO2:  96%  98%  Weight:      Height:        Intake/Output Summary (Last 24 hours) at 01/06/2018 1148 Last data filed at 01/06/2018 1000 Gross per 24 hour  Intake 3650.48 ml  Output 1395 ml  Net 2255.48 ml   Filed Weights   01/02/18 1541  Weight: 101.6 kg (224 lb)    Examination:  General: Pt is alert, awake, not in acute distress, NG tube in place Cardiovascular: RRR, S1/S2 +, no rubs, no gallops Respiratory: CTA bilaterally, no wheezing, no rhonchi Abdominal: Soft, nontender, nondistended, colostomy bag with brown soft stool, positive bowel sounds Extremities: Bilateral lower extremity trace edema  Data Reviewed: I have personally reviewed following labs and imaging studies  CBC: Recent Labs  Lab 01/02/18 1639 01/04/18 0350 01/05/18 0811 01/06/18 0430  WBC 5.5 3.2* 3.6* 4.2  NEUTROABS  --  2.2 2.7  --   HGB 9.7* 9.9* 10.3* 9.8*  HCT 31.5* 32.1* 33.5* 31.9*  MCV 93.5 96.4 94.6 93.8  PLT 283 244 239 093   Basic Metabolic Panel: Recent Labs  Lab 01/02/18 1639 01/03/18 0513 01/04/18 0350 01/05/18 0811 01/06/18 0430  NA 146* 145 146* 144 143  K 3.6 3.6 4.0 3.6 3.8  CL 111 109 110 104 106  CO2 29 32 33* 34* 31  GLUCOSE 166* 103* 117* 110* 119*  BUN _0 CREATININE 1.09 1.01 1.16 1.06 0.86  CALCIUM 8.3* 7.9* 8.0* 8.1* 7.8*  MG  --  1.6* 2.1 1.8 2.0   GFR: Estimated Creatinine Clearance: 100.3 mL/min (by C-G formula based on SCr of 0.86 mg/dL). Liver Function Tests: Recent Labs  Lab 01/02/18 1639  AST 14*  ALT 15*  ALKPHOS 78  BILITOT 1.0  PROT 6.0*  ALBUMIN 3.1*   Recent Labs   Lab 01/02/18 1639  LIPASE 18   No results for input(s): AMMONIA in the last 168 hours. Coagulation Profile: Recent Labs  Lab 01/05/18 1121  INR 1.25   Cardiac Enzymes: No results for input(s): CKTOTAL, CKMB, CKMBINDEX, TROPONINI in the last 168 hours. BNP (last 3 results) No results for input(s): PROBNP in the last 8760 hours. HbA1C: No results for input(s): HGBA1C in the last 72 hours. CBG: Recent Labs  Lab 01/05/18 2009 01/06/18 0023 01/06/18 0415 01/06/18 0745 01/06/18 1128  GLUCAP 105* 103* 122* 110* 112*   Lipid Profile: No results for input(s): CHOL, HDL, LDLCALC, TRIG, CHOLHDL, LDLDIRECT in the last 72 hours. Thyroid Function Tests: No results for input(s): TSH, T4TOTAL, FREET4, T3FREE, THYROIDAB in the last 72 hours. Anemia Panel: No results for input(s): VITAMINB12, FOLATE, FERRITIN, TIBC, IRON, RETICCTPCT in the last 72 hours. Sepsis Labs: No results for input(s): PROCALCITON, LATICACIDVEN in the last 168 hours.  No results found for this or any previous visit (from the past 240 hour(s)).    Radiology Studies: Dg Abd 2 Views  Result Date: 01/06/2018 CLINICAL DATA:  Small bowel obstruction EXAM: ABDOMEN - 2 VIEW COMPARISON:  CT abdomen and pelvis January 02, 2018; abdominal radiographs January 05, 2018 FINDINGS: Supine and upright images were obtained. Nasogastric tube tip and side port are in the stomach. There remain multiple loops of dilated small bowel with scattered air-fluid levels consistent with persistent obstruction. Note that a small amount of contrast is seen in the proximal colon. No free air. There is iliac artery atherosclerosis bilaterally. Visualized lung bases are clear. IMPRESSION: Persistent small bowel obstruction. No free air. Nasogastric tube tip and side port in stomach. Note that there is a small amount of contrast in the colon. Extensive iliac artery atherosclerosis noted. Electronically Signed   By: Lowella Grip III M.D.   On: 01/06/2018  08:53   Dg Abd 2 Views  Result Date: 01/05/2018 CLINICAL DATA:  Small bowel obstruction.  NG tube in place. EXAM: ABDOMEN - 2 VIEW COMPARISON:  Plain films the abdomen 01/04/2018. FINDINGS: NG tube remains in place. Gaseous distention of small bowel with loops measuring up to 4.5 cm has mildly improved from 5.1 cm. Air-fluid levels are seen in small bowel. No free intraperitoneal air is identified. IMPRESSION: Mild improvement in gaseous distention of small bowel consistent with small bowel obstruction. Electronically Signed   By: Inge Rise M.D.   On: 01/05/2018 09:59      Scheduled Meds: . insulin aspart  0-9 Units Subcutaneous Q4H  . metoprolol tartrate  10 mg Intravenous Q6H  . mometasone-formoterol  2 puff Inhalation BID  . tiotropium  18 mcg Inhalation Daily   Continuous Infusions: . dextrose 5 % and 0.45 % NaCl with KCl 20 mEq/L 100 mL/hr at 01/06/18 0941  . famotidine (PEPCID) IV Stopped (  01/06/18 1023)  . heparin 1,950 Units/hr (01/06/18 1120)     LOS: 4 days    Time spent: Total of 25 minutes spent with pt, greater than 50% of which was spent in discussion of  treatment, counseling and coordination of care    Chipper Oman, MD Pager: Text Page via [email or web address]   If 7PM-7AM, please contact night-coverage [email or web address] 01/06/2018, 11:48 AM   Note - This record has been created using [employer]. Chart creation errors have been sought, but may not always have been located. Such creation errors do not reflect on the standard of medical care.

## 2018-01-06 NOTE — Progress Notes (Addendum)
[facility] Progress Note     Subjective: CC:  Denies abdominal pain. Having gas and non-bloody stool in colostomy pouch. Mobilizing.  Objective: Vital signs in last 24 hours: Temp:  [98.9 F (37.2 C)-99.3 F (37.4 C)] 98.9 F (37.2 C) (06/17 0606) Pulse Rate:  [72-96] 96 (06/17 0606) Resp:  [11-17] 17 (06/16 2243) BP: (134-155)/(77-97) 139/90 (06/17 0647) SpO2:  [85 %-97 %] 96 % (06/17 0606) Last BM Date: 01/05/18  Intake/Output from previous day: 06/16 0701 - 06/17 0700 In: 3358.8 [P.O.:120; I.V.:2852.2; IV Piggyback:386.7] Out: 1525 [Urine:400; Emesis/NG output:800; Stool:325] Intake/Output this shift: Total I/O In: -  Out: 24 [Stool:50]  PE: Gen:  Alert, NAD, pleasant Card:  Regular rate and rhythm Pulm:  Normal effort Abd: Soft, non-tender, mild distention, incision c/d/i - steri strips removed and one staple removed, stoma viable, ostomy pouch with gas.  NGT 800 cc/24h  Skin: warm and dry, no rashes  Psych: A&Ox3   Lab Results:  Recent Labs    01/04/18 0350 01/05/18 0811  WBC 3.2* 3.6*  HGB 9.9* 10.3*  HCT 32.1* 33.5*  PLT 244 239   BMET Recent Labs    01/05/18 0811 01/06/18 0430  NA 144 143  K 3.6 3.8  CL 104 106  CO2 34* 31  GLUCOSE 110* 119*  BUN 9 7  CREATININE 1.06 0.86  CALCIUM 8.1* 7.8*   PT/INR Recent Labs    01/05/18 1121  LABPROT 15.6*  INR 1.25   CMP     Component Value Date/Time   NA 143 01/06/2018 0430   K 3.8 01/06/2018 0430   CL 106 01/06/2018 0430   CO2 31 01/06/2018 0430   GLUCOSE 119 (H) 01/06/2018 0430   BUN 7 01/06/2018 0430   CREATININE 0.86 01/06/2018 0430   CREATININE 1.23 04/10/2011 1307   CALCIUM 7.8 (L) 01/06/2018 0430   PROT 6.0 (L) 01/02/2018 1639   ALBUMIN 3.1 (L) 01/02/2018 1639   AST 14 (L) 01/02/2018 1639   ALT 15 (L) 01/02/2018 1639   ALKPHOS 78 01/02/2018 1639   BILITOT 1.0 01/02/2018 1639   GFRNONAA >60 01/06/2018 0430   GFRAA >60 01/06/2018 0430   Lipase     Component  Value Date/Time   LIPASE 18 01/02/2018 1639       Studies/Results: Dg Abd 2 Views  Result Date: 01/05/2018 CLINICAL DATA:  Small bowel obstruction.  NG tube in place. EXAM: ABDOMEN - 2 VIEW COMPARISON:  Plain films the abdomen 01/04/2018. FINDINGS: NG tube remains in place. Gaseous distention of small bowel with loops measuring up to 4.5 cm has mildly improved from 5.1 cm. Air-fluid levels are seen in small bowel. No free intraperitoneal air is identified. IMPRESSION: Mild improvement in gaseous distention of small bowel consistent with small bowel obstruction. Electronically Signed   By: Inge Rise M.D.   On: 01/05/2018 09:59   Dg Abd 2 Views  Result Date: 01/04/2018 CLINICAL DATA:  Small-bowel obstruction EXAM: ABDOMEN - 2 VIEW COMPARISON:  01/03/2018 FINDINGS: NG has been pulled back and now is in the body the stomach. Ostomy in the left abdomen. Dilated small bowel loops with air-fluid levels in left abdomen similar to the prior study. No free air. Colon decompressed. IMPRESSION: Small-bowel obstruction pattern unchanged. NG in the body of the stomach. No free air. Electronically Signed   By: Franchot Gallo M.D.   On: 01/04/2018 09:49    Anti-infectives: Anti-infectives (From admission, onward)   None     Assessment/Plan A fib -  last dose Eliquis 12/18/17, continue to hold DM HTN OSA BPH H/o gastric ulcer - seen on EGD 5/30  Small bowel obstruction - status post Hartman's procedure with end colostomy 12/13/17 by Dr. Marcello Moores for sigmoid stricture -CT scan 6/13 showedsmall bowel obstruction - clinically improving and having gas/stool (325 cc/24h) in colostomy pouch; AXR today w/ mild improvement but persistent air-fluid levels, small amt contrast in R colon. - NG clamping trial today, start clear liquids this afternoon if tolerates. - encouraged OOB, ambulation  FEN: NPO, clamp NG tube. ID: none VTE: SCD's, heparin gtt Follow up: Dr. Leighton Ruff    LOS: 4 days     Jill Alexanders , [facility] 01/06/2018, 8:05 AM Pager: [phone] Consults: [phone] Mon-Fri 7:00 am-4:30 pm Sat-Sun 7:00 am-11:30 am

## 2018-01-06 NOTE — Progress Notes (Signed)
ANTICOAGULATION CONSULT NOTE - f/u Consult  Pharmacy Consult for IV heparin Indication: atrial fibrillation (apixaban on hold)  Allergies  Allergen Reactions  . Sulfa Antibiotics Other (See Comments)    Headaches     Patient Measurements: Height: _0  (190.5 cm) Weight: 224 lb (101.6 kg) IBW/kg (Calculated) : 84.5 Heparin Dosing Weight: actual body weight  Vital Signs: Temp: 99.3 F (37.4 C) (06/16 2243) Temp Source: Oral (06/16 2243) BP: 137/90 (06/17 0007) Pulse Rate: 88 (06/17 0007)  Labs: Recent Labs    01/03/18 0513 01/04/18 0350 01/05/18 0811 01/05/18 1121 01/05/18 2213  HGB  --  9.9* 10.3*  --   --   HCT  --  32.1* 33.5*  --   --   PLT  --  244 239  --   --   APTT  --   --   --  36  --   LABPROT  --   --   --  15.6*  --   INR  --   --   --  1.25  --   HEPARINUNFRC  --   --   --   --  <0.10*  CREATININE 1.01 1.16 1.06  --   --     Estimated Creatinine Clearance: 81.3 mL/min (by C-G formula based on SCr of 1.06 mg/dL).   Medical History: Past Medical History:  Diagnosis Date  . A-fib ([place])   . BPH (benign prostatic hyperplasia)   . Diabetes mellitus   . Dizziness and giddiness   . Dyslipidemia   . Dysrhythmia   . Hypertension   . Left carotid bruit   . OSA (obstructive sleep apnea)   . S/P colonoscopy     Assessment: 39 y/oM with PMH of a-fib on chronic apixaban therapy recently admitted for colonic stricture and pneumatosis of the colon, s/p Hartman's procedure with end colostomy on 12/13/17. Hospital course complicated by upper GI bleed. Last dose of apixaban was 12/18/17 at 2101. Apixaban was held at discharge, was to resume on 01/06/18 per med rec. Patient, however, came to ED on 01/02/18 with complaints of abdominal distention and no output from colostomy, found to have SBO. Pharmacy consulted today for heparin infusion while patient NPO (and apixaban on hold). CBC reveals Hgb 10.3, Pltc WNL.  6/16 2213 HL= <0.10 below goal, no infusion or  bleeding issues per RN.   Goal of Therapy:  Heparin level 0.3-0.5 units/ml (targeting lower end of therapeutic range given recent bleed per discussion with Dr. Quincy Simmonds) Monitor platelets by anticoagulation protocol: Yes   Plan:   Increase heparin drip to 1800 units/hr (no bolus, per discussion with Dr. Quincy Simmonds, in light of recent bleed)  Heparin level 8 hours after heparin infusion increased  Daily CBC and heparin level  Monitor closely for s/sx of bleeding    Dorrene German 01/05/2018 11:19 AM

## 2018-01-06 NOTE — Progress Notes (Signed)
ANTICOAGULATION CONSULT NOTE - f/u Consult  Pharmacy Consult for IV heparin Indication: atrial fibrillation (apixaban on hold)  Allergies  Allergen Reactions  . Sulfa Antibiotics Other (See Comments)    Headaches     Patient Measurements: Height: _0  (190.5 cm) Weight: 224 lb (101.6 kg) IBW/kg (Calculated) : 84.5 Heparin Dosing Weight: actual body weight  Vital Signs: Temp: 98.9 F (37.2 C) (06/17 0606) Temp Source: Oral (06/17 0606) BP: 139/90 (06/17 0647) Pulse Rate: 96 (06/17 0606)  Labs: Recent Labs    01/04/18 0350 01/05/18 0811 01/05/18 1121 01/05/18 2213 01/06/18 0430 01/06/18 0956  HGB 9.9* 10.3*  --   --  9.8*  --   HCT 32.1* 33.5*  --   --  31.9*  --   PLT 244 239  --   --  246  --   APTT  --   --  36  --   --   --   LABPROT  --   --  15.6*  --   --   --   INR  --   --  1.25  --   --   --   HEPARINUNFRC  --   --   --  <0.10*  --  0.21*  CREATININE 1.16 1.06  --   --  0.86  --     Estimated Creatinine Clearance: 100.3 mL/min (by C-G formula based on SCr of 0.86 mg/dL).   Medical History: Past Medical History:  Diagnosis Date  . A-fib ([place])   . BPH (benign prostatic hyperplasia)   . Diabetes mellitus   . Dizziness and giddiness   . Dyslipidemia   . Dysrhythmia   . Hypertension   . Left carotid bruit   . OSA (obstructive sleep apnea)   . S/P colonoscopy     Assessment: 76 y/oM with PMH of a-fib on chronic apixaban therapy recently admitted for colonic stricture and pneumatosis of the colon, s/p Hartman's procedure with end colostomy on 12/13/17. Hospital course complicated by upper GI bleed. Last dose of apixaban was 12/18/17 at 2101. Apixaban was held at discharge, was to resume on 01/06/18 per med rec. Patient, however, came to ED on 01/02/18 with complaints of abdominal distention and no output from colostomy, found to have SBO. Pharmacy consulted today for heparin infusion while patient NPO (and apixaban on hold). CBC reveals Hgb 10.3, Pltc WNL.  Marland Kitchen  Today, 01/06/18  Heparin level subtherapeutic but rising since IV heparin rate increased from 1500 to 1800 units/hr  No reported bleeding  CBC Ok  Goal of Therapy:  Heparin level 0.3-0.5 units/ml (targeting lower end of therapeutic range given recent bleed per discussion with Dr. Quincy Simmonds) Monitor platelets by anticoagulation protocol: Yes   Plan:   Increase heparin drip from 1800 units/hr to 1950 units/hr (no bolus, per discussion with Dr. Quincy Simmonds, in light of recent bleed)  Heparin level 8 hours after heparin infusion increased  Daily CBC and heparin level  Monitor closely for s/sx of bleeding   Adrian Saran, PharmD, BCPS Pager [phone] 01/06/2018 11:14 AM

## 2018-01-06 NOTE — Progress Notes (Signed)
ANTICOAGULATION CONSULT NOTE - f/u Consult  Pharmacy Consult for IV heparin Indication: atrial fibrillation (apixaban on hold)  Allergies  Allergen Reactions  . Sulfa Antibiotics Other (See Comments)    Headaches     Patient Measurements: Height: _0  (190.5 cm) Weight: 224 lb (101.6 kg) IBW/kg (Calculated) : 84.5 Heparin Dosing Weight: actual body weight  Vital Signs: Temp: 98 F (36.7 C) (06/17 2019) Temp Source: Oral (06/17 2019) BP: 135/83 (06/17 2019) Pulse Rate: 74 (06/17 2019)  Labs: Recent Labs    01/04/18 0350 01/05/18 0811 01/05/18 1121 01/05/18 2213 01/06/18 0430 01/06/18 0956 01/06/18 1951  HGB 9.9* 10.3*  --   --  9.8*  --   --   HCT 32.1* 33.5*  --   --  31.9*  --   --   PLT 244 239  --   --  246  --   --   APTT  --   --  36  --   --   --   --   LABPROT  --   --  15.6*  --   --   --   --   INR  --   --  1.25  --   --   --   --   HEPARINUNFRC  --   --   --  <0.10*  --  0.21* 0.20*  CREATININE 1.16 1.06  --   --  0.86  --   --     Estimated Creatinine Clearance: 100.3 mL/min (by C-G formula based on SCr of 0.86 mg/dL).   Medical History: Past Medical History:  Diagnosis Date  . A-fib ([place])   . BPH (benign prostatic hyperplasia)   . Diabetes mellitus   . Dizziness and giddiness   . Dyslipidemia   . Dysrhythmia   . Hypertension   . Left carotid bruit   . OSA (obstructive sleep apnea)   . S/P colonoscopy     Assessment: 66 y/oM with PMH of a-fib on chronic apixaban therapy recently admitted for colonic stricture and pneumatosis of the colon, s/p Hartman's procedure with end colostomy on 12/13/17. Hospital course complicated by upper GI bleed. Last dose of apixaban was 12/18/17 at 2101. Apixaban was held at discharge, was to resume on 01/06/18 per med rec. Patient, however, came to ED on 01/02/18 with complaints of abdominal distention and no output from colostomy, found to have SBO. Pharmacy consulted today for heparin infusion while patient NPO  (and apixaban on hold). CBC reveals Hgb 10.3, Pltc WNL. Marland Kitchen  Today, 01/06/18  Heparin level subtherapeutic but rising since IV heparin rate increased from 1500 to 1800 units/hr  No reported bleeding  CBC Ok 2nd shift update: - Heparin level = 0.2 with heparin infusing @ 1950 units/hr - no complications of therapy noted per RN  Goal of Therapy:  Heparin level 0.3-0.5 units/ml (targeting lower end of therapeutic range given recent bleed per discussion with Dr. Quincy Simmonds) Monitor platelets by anticoagulation protocol: Yes   Plan:   Increase heparin drip to 2100 units/hr (no bolus, per discussion with Dr. Quincy Simmonds, in light of recent bleed)  Heparin level 8 hours after heparin infusion increased  Daily CBC and heparin level  Monitor closely for s/sx of bleeding   Leone Haven, PharmD 01/06/2018 8:33 PM

## 2018-01-07 LAB — CBC
HCT: 31.5 % — ABNORMAL LOW (ref 39.0–52.0)
Hemoglobin: 9.7 g/dL — ABNORMAL LOW (ref 13.0–17.0)
MCH: 28.7 pg (ref 26.0–34.0)
MCHC: 30.8 g/dL (ref 30.0–36.0)
MCV: 93.2 fL (ref 78.0–100.0)
PLATELETS: 229 10*3/uL (ref 150–400)
RBC: 3.38 MIL/uL — ABNORMAL LOW (ref 4.22–5.81)
RDW: 16 % — AB (ref 11.5–15.5)
WBC: 4.1 10*3/uL (ref 4.0–10.5)

## 2018-01-07 LAB — GLUCOSE, CAPILLARY
GLUCOSE-CAPILLARY: 100 mg/dL — AB (ref 65–99)
Glucose-Capillary: 93 mg/dL (ref 65–99)
Glucose-Capillary: 94 mg/dL (ref 65–99)

## 2018-01-07 LAB — HEPARIN LEVEL (UNFRACTIONATED): Heparin Unfractionated: 0.37 IU/mL (ref 0.30–0.70)

## 2018-01-07 MED ORDER — FAMOTIDINE 20 MG PO TABS: 20.0000 mg | ORAL_TABLET | Freq: Two times a day (BID) | ORAL | Status: DC

## 2018-01-07 MED ORDER — DILTIAZEM HCL ER COATED BEADS 120 MG PO CP24: 120.0000 mg | ORAL_CAPSULE | Freq: Every day | ORAL | Status: DC

## 2018-01-07 MED ORDER — METOPROLOL TARTRATE 25 MG PO TABS: 25.0000 mg | ORAL_TABLET | Freq: Two times a day (BID) | ORAL | Status: DC

## 2018-01-07 MED ORDER — APIXABAN 5 MG PO TABS: 5.0000 mg | ORAL_TABLET | Freq: Two times a day (BID) | ORAL | Status: DC

## 2018-01-07 NOTE — Progress Notes (Signed)
ANTICOAGULATION CONSULT NOTE - Follow Up Consult  Pharmacy Consult for Heparin Indication: atrial fibrillation (apixaban on hold)  Allergies  Allergen Reactions  . Sulfa Antibiotics Other (See Comments)    Headaches     Patient Measurements: Height: _0  (190.5 cm) Weight: 224 lb (101.6 kg) IBW/kg (Calculated) : 84.5 Heparin Dosing Weight:   Vital Signs: Temp: 98.2 F (36.8 C) (06/18 0411) Temp Source: Oral (06/18 0411) BP: 139/94 (06/18 0514) Pulse Rate: 94 (06/18 0411)  Labs: Recent Labs    01/05/18 0811 01/05/18 1121  01/06/18 0430 01/06/18 0956 01/06/18 1951 01/07/18 0442  HGB 10.3*  --   --  9.8*  --   --  9.7*  HCT 33.5*  --   --  31.9*  --   --  31.5*  PLT 239  --   --  246  --   --  229  APTT  --  36  --   --   --   --   --   LABPROT  --  15.6*  --   --   --   --   --   INR  --  1.25  --   --   --   --   --   HEPARINUNFRC  --   --    < >  --  0.21* 0.20* 0.37  CREATININE 1.06  --   --  0.86  --   --   --    < > = values in this interval not displayed.    Estimated Creatinine Clearance: 100.3 mL/min (by C-G formula based on SCr of 0.86 mg/dL).   Medications:  Infusions:  . dextrose 5 % and 0.45 % NaCl with KCl 20 mEq/L 100 mL/hr at 01/06/18 2017  . famotidine (PEPCID) IV Stopped (01/06/18 2220)  . heparin 2,100 Units/hr (01/06/18 2238)    Assessment: Patient with heparin level at reduced goal (0.3-0.5).  No heparin issues noted.  Goal of Therapy:  Heparin level 0.3-0.5 units/ml Monitor platelets by anticoagulation protocol: Yes   Plan:  Continue heparin drip at current rate Recheck level at [address] [place] 01/07/2018,5:42 AM

## 2018-01-07 NOTE — Discharge Summary (Addendum)
Physician Discharge Summary  Manuel [name]  XAJ:[id number]  DOB: 13-Feb-1946  DOA: 01/02/2018 PCP: Jani Gravel, MD  Admit date: 01/02/2018 Discharge date: 01/07/2018  Admitted From: Home  Disposition:  Home   Recommendations for Outpatient Follow-up:  1. Follow up with PCP in 1 week  2. Follow up with Dr Marcello Moores (gen surgery) in 1 week  3. Please obtain BMP/CBC in one week to monitor hgb and renal function   Discharge Condition: Stable   CODE STATUS:Full Code   Diet recommendation: Heart Healthy / Carb Modified  Brief/Interim Summary: For full details see H&P/Progress note, but in brief, Manuel [name] is a 72 year old male with medical history of diabetes type 2, A. Fib, recent colostomy 11/2017 due to colonic stricture and pneumatosis, hypertension BPH and OSA. Patient presented to the emergency department complaining of abdominal distention and no output from colostomy. Upon ED evaluation CT of the abdomen and pelvis showed small bowel obstruction with transition point over the mid abdomen just left of midline likely due to adhesions. General surgery consulted and recommended conservative treatment with NG tube and IV fluids. Patient admitted with working diagnosis of SBO.  Was consulted and patient was treated conservatively, patient clinically improved and deemed stable to discharge.  Subjective: Patient seen and examined, has no complaints.  No abdominal pain.  Colostomy bag with soft stools.  Denies nausea vomiting.  Tolerating diet well.   Discharge Diagnoses/Hospital Course:  Small bowel obstruction In setting of recent Hartmann procedure with colostomy General surgery was consulted, patient was treated conservatively with NG tube and n.p.o.  Subsequently small bowel protocol was performed.  Eventually patient SBO resolved with no intervention.  He was started on clear liquid diet and was advanced as tolerated.  Patient now with stools on colostomy bag and asymptomatic.  Patient  stable for discharge to follow-up with PCP and general surgery.  A. Fib - remains stable Heart rate remain controlled during hospital stay, patient was placed on IV metoprolol while n.p.o.  Also anticoagulation was held subsequently placed on heparin drip.  Anticoagulation was supposed to start on 6/17 after being held due to gastric ulcer bleed.  As patient is tolerating oral diet and no signs of bleeding will resume oral anticoagulation with Eliquis. Continue p.o. Cardizem and metoprolol at current home dose.  Bilateral lower extremity pitting edema - improved In setting of diastolic dysfunction, not on acute exacerbation   Mild cardiomegaly on CT.  Continue leg elevation and compression stocking  New diagnosis diastolic CHF, not acute  No signs of acute exacerbation, LE edema improved  Advised low salt diet and daily weight   DM type 2 controlled Resume home meds  CBG's controlled during hospital stay  HTN  BP stable during hospital stay Resume home meds with no changes   All other chronic medical condition were stable during the hospitalization.  On the day of the discharge the patient's vitals were stable, and no other acute medical condition were reported by patient. the patient was felt safe to be discharge to home.   Discharge Instructions  You were cared for by a hospitalist during your hospital stay. If you have any questions about your discharge medications or the care you received while you were in the hospital after you are discharged, you can call the unit and asked to speak with the hospitalist on call if the hospitalist that took care of you is not available. Once you are discharged, your primary care physician will handle any further  medical issues. Please note that NO REFILLS for any discharge medications will be authorized once you are discharged, as it is imperative that you return to your primary care physician (or establish a relationship with a primary care  physician if you do not have one) for your aftercare needs so that they can reassess your need for medications and monitor your lab values.  Discharge Instructions    Call MD for:  difficulty breathing, headache or visual disturbances   Complete by:  As directed    Call MD for:  extreme fatigue   Complete by:  As directed    Call MD for:  hives   Complete by:  As directed    Call MD for:  persistant dizziness or light-headedness   Complete by:  As directed    Call MD for:  persistant nausea and vomiting   Complete by:  As directed    Call MD for:  redness, tenderness, or signs of infection (pain, swelling, redness, odor or green/yellow discharge around incision site)   Complete by:  As directed    Call MD for:  severe uncontrolled pain   Complete by:  As directed    Call MD for:  temperature >100.4   Complete by:  As directed    Diet - low sodium heart healthy   Complete by:  As directed    Increase activity slowly   Complete by:  As directed      Allergies as of 01/07/2018      Reactions   Sulfa Antibiotics Other (See Comments)   Headaches       Medication List    STOP taking these medications   levofloxacin 500 MG tablet Commonly known as:  LEVAQUIN     TAKE these medications   acetaminophen 500 MG tablet Commonly known as:  TYLENOL Take 500 mg by mouth every 6 (six) hours as needed for moderate pain.   budesonide-formoterol 160-4.5 MCG/ACT inhaler Commonly known as:  SYMBICORT Inhale 2 puffs into the lungs 2 (two) times daily.   cyanocobalamin 1000 MCG/ML injection Commonly known as:  (VITAMIN B-12) Inject 1 mL (1,000 mcg total) into the muscle daily. Take 1000 MCG's IM daily x4 days, then 1000 MCG's IM weekly x1 month, then 1000 MCG's IM monthly.   diltiazem 120 MG 24 hr capsule Commonly known as:  CARDIZEM CD Take 120 mg by mouth daily.   doxazosin 8 MG tablet Commonly known as:  CARDURA Take 8 mg by mouth daily.   ELIQUIS 5 MG Tabs tablet Generic drug:   apixaban TK 1 T PO BID AFTER FOOD   glimepiride 1 MG tablet Commonly known as:  AMARYL Take 1 mg by mouth daily with breakfast.   metFORMIN 500 MG tablet Commonly known as:  GLUCOPHAGE Take 500 mg by mouth daily.   metoprolol tartrate 25 MG tablet Commonly known as:  LOPRESSOR Take 1 tablet (25 mg total) by mouth 2 (two) times daily.   pantoprazole 40 MG tablet Commonly known as:  PROTONIX Take 1 tablet (40 mg total) by mouth 2 (two) times daily. Take 1 tablet twice daily x8 weeks, and then 1 tablet daily thereafter.   potassium chloride SA 20 MEQ tablet Commonly known as:  K-DUR,KLOR-CON Take 2 tablets (40 mEq total) by mouth daily for 3 days.   pravastatin 20 MG tablet Commonly known as:  PRAVACHOL Take 20 mg by mouth daily.   SYRINGE 3CC/22GX1-1/2" 22G X 1-1/2" 3 ML Misc 1,000 mcg by Does not  apply route daily.   tiotropium 18 MCG inhalation capsule Commonly known as:  SPIRIVA HANDIHALER Place 1 capsule (18 mcg total) into inhaler and inhale daily.   VISBIOME PROBIOTIC HIGH POT Caps TAKE 1 CAPSULE BY MOUTH EVERY DAY      Follow-up Information    Jani Gravel, MD. Schedule an appointment as soon as possible for a visit in 1 week(s).   Specialty:  Internal Medicine Why:  Hospital follow up  Contact information: [place] [address] [phone]        Leighton Ruff, MD. Schedule an appointment as soon as possible for a visit in 1 week(s).   Specialty:  General Surgery Why:  Hospital follow up  Contact information: [address] [place] [address] [phone]          Allergies  Allergen Reactions  . Sulfa Antibiotics Other (See Comments)    Headaches     Consultations:  General surgery   Procedures/Studies: Ct Abdomen Pelvis W Contrast  Result Date: 01/02/2018 CLINICAL DATA:  Colostomy bag not emptying since yesterday afternoon. Nausea and vomiting. EXAM: CT ABDOMEN AND PELVIS WITH CONTRAST  TECHNIQUE: Multidetector CT imaging of the abdomen and pelvis was performed using the standard protocol following bolus administration of intravenous contrast. CONTRAST:  131m ISOVUE-300 IOPAMIDOL (ISOVUE-300) INJECTION 61% COMPARISON:  12/11/2017 FINDINGS: Lower chest: Stable 3 mm nodule over the right middle lobe. Tiny amount of bilateral pleural fluid with associated bibasilar atelectasis. Mild cardiomegaly. Hepatobiliary: Liver and biliary tree are within normal. Gallbladder is contracted. Pancreas: Normal. Spleen: Normal. Adrenals/Urinary Tract: Adrenal glands are within normal. Absence of the left kidney. Right kidney is normal. Right ureter and bladder are normal. Stomach/Bowel: Stomach is normal. Small bowel loops remain moderately dilated up to 4.7 cm. There is an abrupt transition within the mid small bowel over the mid abdomen just left of midline likely the site of small bowel obstruction and likely due to adhesions. A collapsed narrowed segment of colon passes directly by this transition point just due to the colostomy site in the left lower quadrant. The colostomy site in the left lower quadrant demonstrates a small amount of fluid over the dependent portion of the colostomy defect. Appendix is normal. Evidence of patient's recent partial colon resection. Hartmann's pouch present. Vascular/Lymphatic: Within normal. Reproductive: Normal. Other: Mild-to-moderate ascites. No free peritoneal air. No pneumatosis. Small amount of fluid tracking over the right inguinal canal. Musculoskeletal: Degenerative changes of the spine. IMPRESSION: Evidence patient's recent partial colon resection with colostomy over the left lower quadrant. There is a small bowel obstruction unchanged to slightly worse with transition point over the mid abdomen just left of midline likely due to adhesions. Mild to moderate ascites. Small bilateral pleural effusions with bibasilar atelectasis. Absent left kidney. Electronically Signed    By: DMarin OlpM.D.   On: 01/02/2018 19:24   Ct Abdomen Pelvis W Contrast  Result Date: 12/11/2017 CLINICAL DATA:  Left lower quadrant and mid abdominal pain. Constipation. EXAM: CT ABDOMEN AND PELVIS WITH CONTRAST TECHNIQUE: Multidetector CT imaging of the abdomen and pelvis was performed using the standard protocol following bolus administration of intravenous contrast. CONTRAST:  127mISOVUE-300 IOPAMIDOL (ISOVUE-300) INJECTION 61% COMPARISON:  None. FINDINGS: Lower chest: Subsegmental atelectasis or scarring in the lung bases. Cardiomegaly. No effusions. Hepatobiliary: No focal hepatic abnormality. Gallbladder unremarkable. Pancreas: No focal abnormality or ductal dilatation. Spleen: No focal abnormality.  Normal size. Adrenals/Urinary Tract: Small nodule in the left adrenal gland  measures 15 mm, nonspecific. Absence of the left kidney. Right kidney is unremarkable. No hydronephrosis. Urinary bladder unremarkable. Stomach/Bowel: There is marked gaseous distention of the colon with large amount of stool and gas in the colon to the level of the mid sigmoid colon. There is pneumatosis within the wall of the cecum and ascending colon several small bowel loops are dilated and fluid-filled as well. Stomach is decompressed, unremarkable. Sigmoid diverticulosis. No active diverticulitis. Vascular/Lymphatic: No evidence of aneurysm or adenopathy. Mesenteric vessels are widely patent. Reproductive: No visible focal abnormality. Other: Small amount of free fluid in the pelvis and right paracolic gutter. Musculoskeletal: Diffuse degenerative changes in the lumbar spine. IMPRESSION: Marked gaseous distention of the colon to the level of the mid sigmoid colon where there is sigmoid diverticulosis and mild wall thickening which could be related to diverticulosis and muscular hypertrophy or could reflect an annular colonic lesion. In addition, there is pneumatosis within the right colon wall. Large stool burden in the  right colon, transverse colon and descending colon. Small bowel loops are also mild to moderately dilated. Absence of the left kidney. Small left adrenal nodule, 15 mm, nonspecific. Small amount of free fluid in the right lower quadrant and cul-de-sac. These results were called by telephone at the time of interpretation on 12/11/2017 at 1:55 pm to Dr. Jani Gravel , who verbally acknowledged these results. Electronically Signed   By: Rolm Baptise M.D.   On: 12/11/2017 13:53   Dg Chest Port 1 View  Result Date: 12/19/2017 CLINICAL DATA:  Decreased O2 sats EXAM: PORTABLE CHEST 1 VIEW COMPARISON:  03/28/2016 FINDINGS: Mild cardiomegaly. Linear bibasilar atelectasis or scarring, similar to prior study, favor scarring. No effusions or acute bony abnormality. IMPRESSION: Chronic bibasilar densities, likely scarring.  Mild cardiomegaly. Electronically Signed   By: Rolm Baptise M.D.   On: 12/19/2017 09:35   Dg Abd 2 Views  Result Date: 01/06/2018 CLINICAL DATA:  Small bowel obstruction EXAM: ABDOMEN - 2 VIEW COMPARISON:  CT abdomen and pelvis January 02, 2018; abdominal radiographs January 05, 2018 FINDINGS: Supine and upright images were obtained. Nasogastric tube tip and side port are in the stomach. There remain multiple loops of dilated small bowel with scattered air-fluid levels consistent with persistent obstruction. Note that a small amount of contrast is seen in the proximal colon. No free air. There is iliac artery atherosclerosis bilaterally. Visualized lung bases are clear. IMPRESSION: Persistent small bowel obstruction. No free air. Nasogastric tube tip and side port in stomach. Note that there is a small amount of contrast in the colon. Extensive iliac artery atherosclerosis noted. Electronically Signed   By: Lowella Grip III M.D.   On: 01/06/2018 08:53   Dg Abd 2 Views  Result Date: 01/05/2018 CLINICAL DATA:  Small bowel obstruction.  NG tube in place. EXAM: ABDOMEN - 2 VIEW COMPARISON:  Plain films the  abdomen 01/04/2018. FINDINGS: NG tube remains in place. Gaseous distention of small bowel with loops measuring up to 4.5 cm has mildly improved from 5.1 cm. Air-fluid levels are seen in small bowel. No free intraperitoneal air is identified. IMPRESSION: Mild improvement in gaseous distention of small bowel consistent with small bowel obstruction. Electronically Signed   By: Inge Rise M.D.   On: 01/05/2018 09:59   Dg Abd 2 Views  Result Date: 01/04/2018 CLINICAL DATA:  Small-bowel obstruction EXAM: ABDOMEN - 2 VIEW COMPARISON:  01/03/2018 FINDINGS: NG has been pulled back and now is in the body the stomach. Ostomy in the  left abdomen. Dilated small bowel loops with air-fluid levels in left abdomen similar to the prior study. No free air. Colon decompressed. IMPRESSION: Small-bowel obstruction pattern unchanged. NG in the body of the stomach. No free air. Electronically Signed   By: Franchot Gallo M.D.   On: 01/04/2018 09:49   Dg Abd Portable 1v-small Bowel Obstruction Protocol-initial, 8 Hr Delay  Result Date: 01/03/2018 CLINICAL DATA:  Re-evaluate small bowel obstruction. EXAM: PORTABLE ABDOMEN - 1 VIEW COMPARISON:  Plain film of earlier in the day at 0823 hours. FINDINGS: 1821 hours. Single supine portable view. Nasogastric terminating at the distal stomach. Left abdominal small bowel loop measures 4.1 cm versus 4.3 cm on the prior. Stool within the ascending colon. Contrast within the urinary bladder. No gross free intraperitoneal air. Minimal convex right lumbar spine curvature. IMPRESSION: Minimal improvement in small bowel dilatation. Electronically Signed   By: Abigail Miyamoto M.D.   On: 01/03/2018 19:55   Dg Abd Portable 1v-small Bowel Protocol-position Verification  Result Date: 01/03/2018 CLINICAL DATA:  NG tube placement EXAM: PORTABLE ABDOMEN - 1 VIEW COMPARISON:  CT 01/02/2018 FINDINGS: NG tube has been placed. The tip is in the descending duodenum. Dilated small bowel loops compatible  with small bowel obstruction, improved since recent CT. No free air or organomegaly. IMPRESSION: NG tube tip in the descending duodenum. Continued but improving small bowel obstruction pattern. Electronically Signed   By: Rolm Baptise M.D.   On: 01/03/2018 08:48   Discharge Exam: Vitals:   01/07/18 0411 01/07/18 0514  BP: (!) 147/95 (!) 139/94  Pulse: 94   Resp: 18   Temp: 98.2 F (36.8 C)   SpO2: 97%    Vitals:   01/06/18 2141 01/06/18 2236 01/07/18 0411 01/07/18 0514  BP: (!) 156/101 (!) 143/90 (!) 147/95 (!) 139/94  Pulse: 82  94   Resp:   18   Temp:   98.2 F (36.8 C)   TempSrc:   Oral   SpO2:   97%   Weight:      Height:        General: Pt is alert, awake, not in acute distress Cardiovascular: RRR, S1/S2 +, no rubs, no gallops Respiratory: CTA bilaterally, no wheezing, no rhonchi Abdominal: Soft, NT, ND, bowel sounds +, ostomy bag in place with brown stools.  Mid incision clear Extremities: Trace lower extremity edema bilaterally  The results of significant diagnostics from this hospitalization (including imaging, microbiology, ancillary and laboratory) are listed below for reference.     Microbiology: No results found for this or any previous visit (from the past 240 hour(s)).   Labs: BNP (last 3 results) No results for input(s): BNP in the last 8760 hours. Basic Metabolic Panel: Recent Labs  Lab 01/02/18 1639 01/03/18 0513 01/04/18 0350 01/05/18 0811 01/06/18 0430  NA 146* 145 146* 144 143  K 3.6 3.6 4.0 3.6 3.8  CL 111 109 110 104 106  CO2 29 32 33* 34* 31  GLUCOSE 166* 103* 117* 110* 119*  BUN _0 CREATININE 1.09 1.01 1.16 1.06 0.86  CALCIUM 8.3* 7.9* 8.0* 8.1* 7.8*  MG  --  1.6* 2.1 1.8 2.0   Liver Function Tests: Recent Labs  Lab 01/02/18 1639  AST 14*  ALT 15*  ALKPHOS 78  BILITOT 1.0  PROT 6.0*  ALBUMIN 3.1*   Recent Labs  Lab 01/02/18 1639  LIPASE 18   No results for input(s): AMMONIA in the last 168 hours. CBC: Recent  Labs  Lab 01/02/18 1639 01/04/18 0350 01/05/18 0811 01/06/18 0430 01/07/18 0442  WBC 5.5 3.2* 3.6* 4.2 4.1  NEUTROABS  --  2.2 2.7  --   --   HGB 9.7* 9.9* 10.3* 9.8* 9.7*  HCT 31.5* 32.1* 33.5* 31.9* 31.5*  MCV 93.5 96.4 94.6 93.8 93.2  PLT 283 244 239 246 229   Cardiac Enzymes: No results for input(s): CKTOTAL, CKMB, CKMBINDEX, TROPONINI in the last 168 hours. BNP: Invalid input(s): POCBNP CBG: Recent Labs  Lab 01/06/18 1544 01/06/18 2016 01/06/18 2357 01/07/18 0405 01/07/18 0741  GLUCAP 133* 93 93 100* 94   D-Dimer No results for input(s): DDIMER in the last 72 hours. Hgb A1c No results for input(s): HGBA1C in the last 72 hours. Lipid Profile No results for input(s): CHOL, HDL, LDLCALC, TRIG, CHOLHDL, LDLDIRECT in the last 72 hours. Thyroid function studies No results for input(s): TSH, T4TOTAL, T3FREE, THYROIDAB in the last 72 hours.  Invalid input(s): FREET3 Anemia work up No results for input(s): VITAMINB12, FOLATE, FERRITIN, TIBC, IRON, RETICCTPCT in the last 72 hours. Urinalysis    Component Value Date/Time   COLORURINE YELLOW 01/02/2018 1649   APPEARANCEUR CLEAR 01/02/2018 1649   LABSPEC 1.020 01/02/2018 1649   PHURINE 5.0 01/02/2018 1649   GLUCOSEU NEGATIVE 01/02/2018 1649   HGBUR NEGATIVE 01/02/2018 1649   BILIRUBINUR NEGATIVE 01/02/2018 1649   KETONESUR NEGATIVE 01/02/2018 1649   PROTEINUR 100 (A) 01/02/2018 1649   NITRITE NEGATIVE 01/02/2018 1649   LEUKOCYTESUR NEGATIVE 01/02/2018 1649   Sepsis Labs Invalid input(s): PROCALCITONIN,  WBC,  LACTICIDVEN Microbiology No results found for this or any previous visit (from the past 240 hour(s)).  Time coordinating discharge: 35 minutes  SIGNED:  Chipper Oman, MD  [facility] 01/07/2018, 10:59 AM  Pager please text page via  [email or web address]  Note - This record has been created using [employer]. Chart creation errors have been sought, but may not always have been located. Such  creation errors do not reflect on the standard of medical care.

## 2018-01-07 NOTE — Progress Notes (Signed)
[facility] Progress Note     Subjective: CC:  Tolerating full liquids. Denies abd pain or distention. Denies N/V. Having colostomy output. States he did pass some stool per rectum last night. Also states he was supposed to re-start his Eliquis today.   Objective: Vital signs in last 24 hours: Temp:  [98 F (36.7 C)-99.1 F (37.3 C)] 98.2 F (36.8 C) (06/18 0411) Pulse Rate:  [74-94] 94 (06/18 0411) Resp:  [16-18] 18 (06/18 0411) BP: (135-156)/(83-101) 139/94 (06/18 0514) SpO2:  [96 %-99 %] 97 % (06/18 0411) Last BM Date: 01/07/18  Intake/Output from previous day: 06/17 0701 - 06/18 0700 In: 2914.4 [P.O.:540; I.V.:2267.8; IV Piggyback:106.7] Out: 2230 [Urine:1700; Emesis/NG output:80; Stool:450] Intake/Output this shift: No intake/output data recorded.  PE: Gen:  Alert, NAD, pleasant Pulm:  Normal effort Abd: Soft, non-tender, mild distention, bowel sounds present in all 4 quadrants, incision healing appropriately without erythema, stoma viable with liquid stool and gas in pouch. Skin: warm and dry, no rashes  Psych: A&Ox3   Lab Results:  Recent Labs    01/06/18 0430 01/07/18 0442  WBC 4.2 4.1  HGB 9.8* 9.7*  HCT 31.9* 31.5*  PLT 246 229   BMET Recent Labs    01/05/18 0811 01/06/18 0430  NA 144 143  K 3.6 3.8  CL 104 106  CO2 34* 31  GLUCOSE 110* 119*  BUN 9 7  CREATININE 1.06 0.86  CALCIUM 8.1* 7.8*   PT/INR Recent Labs    01/05/18 1121  LABPROT 15.6*  INR 1.25   CMP     Component Value Date/Time   NA 143 01/06/2018 0430   K 3.8 01/06/2018 0430   CL 106 01/06/2018 0430   CO2 31 01/06/2018 0430   GLUCOSE 119 (H) 01/06/2018 0430   BUN 7 01/06/2018 0430   CREATININE 0.86 01/06/2018 0430   CREATININE 1.23 04/10/2011 1307   CALCIUM 7.8 (L) 01/06/2018 0430   PROT 6.0 (L) 01/02/2018 1639   ALBUMIN 3.1 (L) 01/02/2018 1639   AST 14 (L) 01/02/2018 1639   ALT 15 (L) 01/02/2018 1639   ALKPHOS 78 01/02/2018 1639   BILITOT 1.0  01/02/2018 1639   GFRNONAA >60 01/06/2018 0430   GFRAA >60 01/06/2018 0430   Lipase     Component Value Date/Time   LIPASE 18 01/02/2018 1639       Studies/Results: Dg Abd 2 Views  Result Date: 01/06/2018 CLINICAL DATA:  Small bowel obstruction EXAM: ABDOMEN - 2 VIEW COMPARISON:  CT abdomen and pelvis January 02, 2018; abdominal radiographs January 05, 2018 FINDINGS: Supine and upright images were obtained. Nasogastric tube tip and side port are in the stomach. There remain multiple loops of dilated small bowel with scattered air-fluid levels consistent with persistent obstruction. Note that a small amount of contrast is seen in the proximal colon. No free air. There is iliac artery atherosclerosis bilaterally. Visualized lung bases are clear. IMPRESSION: Persistent small bowel obstruction. No free air. Nasogastric tube tip and side port in stomach. Note that there is a small amount of contrast in the colon. Extensive iliac artery atherosclerosis noted. Electronically Signed   By: Lowella Grip III M.D.   On: 01/06/2018 08:53    Anti-infectives: Anti-infectives (From admission, onward)   None     Assessment/Plan A fib - last dose Eliquis5/29/19, ok from surgical perspective to resume this, will defer to medicine DM HTN OSA BPH H/o gastric ulcer - seen on EGD 5/30  Small bowel obstruction -status postHartman's procedure  with end colostomy 12/13/17 by Dr. Marcello Moores forsigmoid stricture -CT scan 6/13 showedsmall bowel obstruction - clinically improving and having gas/stool in colostomy pouch - advance diet to SOFT - encouraged OOB, ambulation  FEN: NPO, clamp NG tube. ID: none VTE: SCD's, heparin gtt Follow up: Dr. Leighton Ruff   Plan - SBO clinically resolved and patient is stable for discharge from surgical perspective if tolerates a soft diet. Anticoagulation per primary service.    LOS: 5 days    Jill Alexanders , [facility] 01/07/2018,  10:07 AM Pager: [phone] Consults: [phone] Mon-Fri 7:00 am-4:30 pm Sat-Sun 7:00 am-11:30 am

## 2018-01-07 NOTE — Progress Notes (Signed)
ANTICOAGULATION CONSULT NOTE - Follow Up Consult  Pharmacy Consult for Heparin - transitioning back to apixaban AM 6/18 Indication: atrial fibrillation  Allergies  Allergen Reactions  . Sulfa Antibiotics Other (See Comments)    Headaches     Patient Measurements: Height: _0  (190.5 cm) Weight: 224 lb (101.6 kg) IBW/kg (Calculated) : 84.5 Heparin Dosing Weight:   Vital Signs: Temp: 98.2 F (36.8 C) (06/18 0411) Temp Source: Oral (06/18 0411) BP: 139/94 (06/18 0514) Pulse Rate: 94 (06/18 0411)  Labs: Recent Labs    01/05/18 0811 01/05/18 1121  01/06/18 0430 01/06/18 0956 01/06/18 1951 01/07/18 0442  HGB 10.3*  --   --  9.8*  --   --  9.7*  HCT 33.5*  --   --  31.9*  --   --  31.5*  PLT 239  --   --  246  --   --  229  APTT  --  36  --   --   --   --   --   LABPROT  --  15.6*  --   --   --   --   --   INR  --  1.25  --   --   --   --   --   HEPARINUNFRC  --   --    < >  --  0.21* 0.20* 0.37  CREATININE 1.06  --   --  0.86  --   --   --    < > = values in this interval not displayed.    Estimated Creatinine Clearance: 100.3 mL/min (by C-G formula based on SCr of 0.86 mg/dL).   Medications:  Infusions:  . famotidine (PEPCID) IV Stopped (01/07/18 1040)    Assessment: 48 y/oM with PMH of a-fib on chronic apixaban therapy recently admitted for colonic stricture and pneumatosis of the colon, s/p Hartman's procedure with end colostomy on 12/13/17. Hospital course complicated by upper GI bleed. Last dose of apixaban was 12/18/17 at 2101. Apixaban was held at discharge, was to resume on 01/06/18 per med rec. Patient, however, came to ED on 01/02/18 with complaints of abdominal distention and no output from colostomy, found to have SBO. Pharmacy consulted today for heparin infusion while patient NPO (and apixaban on hold). CBC reveals Hgb 10.3, Pltc WNL  Today, 01/07/18  To discontinue IV heparin and restart patient's home apixaban per Md orders  Goal of Therapy:  Heparin  level 0.3-0.5 units/ml Monitor platelets by anticoagulation protocol: Yes   Plan:  1) Discontinue IV heparin 2) Start apixaban 81m BID 3) Continue to monitor CBC, SCr, and for signs/symptoms of bleeding   JAdrian Saran PharmD, BCPS Pager [phone]/18/2019 10:46 AM

## 2018-01-07 NOTE — Progress Notes (Signed)
Pt was discharged home today. Instructions were reviewed with patient, and questions were answered. Pt was taken to main entrance via wheelchair by NT.

## 2018-01-07 NOTE — Care Management Important Message (Signed)
Important Message  Patient Details  Name: Manuel [name] MRN: [id number] Date of Birth: [birth year]   Medicare Important Message Given:  Yes    Kerin Salen 01/07/2018, 10:24 AMImportant Message  Patient Details  Name: Manuel [name] MRN: [id number] Date of Birth: 1946-06-14   Medicare Important Message Given:  Yes    Kerin Salen 01/07/2018, 10:24 AM

## 2018-01-09 DIAGNOSIS — G4733 Obstructive sleep apnea (adult) (pediatric): Secondary | ICD-10-CM | POA: Diagnosis not present

## 2018-01-10 ENCOUNTER — Encounter (HOSPITAL_COMMUNITY): Payer: Self-pay | Admitting: General Surgery

## 2018-01-10 NOTE — OR Nursing (Signed)
Addendum created to correct out of recovery time

## 2018-01-13 DIAGNOSIS — Z86010 Personal history of colonic polyps: Secondary | ICD-10-CM | POA: Diagnosis not present

## 2018-01-13 DIAGNOSIS — K5730 Diverticulosis of large intestine without perforation or abscess without bleeding: Secondary | ICD-10-CM | POA: Diagnosis not present

## 2018-01-13 DIAGNOSIS — Z933 Colostomy status: Secondary | ICD-10-CM | POA: Diagnosis not present

## 2018-01-14 DIAGNOSIS — Z933 Colostomy status: Secondary | ICD-10-CM | POA: Diagnosis not present

## 2018-01-15 DIAGNOSIS — I4891 Unspecified atrial fibrillation: Secondary | ICD-10-CM | POA: Diagnosis not present

## 2018-01-15 DIAGNOSIS — I10 Essential (primary) hypertension: Secondary | ICD-10-CM | POA: Diagnosis not present

## 2018-01-15 DIAGNOSIS — E7800 Pure hypercholesterolemia, unspecified: Secondary | ICD-10-CM | POA: Diagnosis not present

## 2018-01-24 DIAGNOSIS — Z933 Colostomy status: Secondary | ICD-10-CM | POA: Diagnosis not present

## 2018-02-03 DIAGNOSIS — Z933 Colostomy status: Secondary | ICD-10-CM | POA: Diagnosis not present

## 2018-02-10 DIAGNOSIS — I480 Paroxysmal atrial fibrillation: Secondary | ICD-10-CM | POA: Diagnosis not present

## 2018-02-10 DIAGNOSIS — G4733 Obstructive sleep apnea (adult) (pediatric): Secondary | ICD-10-CM | POA: Diagnosis not present

## 2018-02-10 DIAGNOSIS — I10 Essential (primary) hypertension: Secondary | ICD-10-CM | POA: Diagnosis not present

## 2018-02-10 DIAGNOSIS — Z125 Encounter for screening for malignant neoplasm of prostate: Secondary | ICD-10-CM | POA: Diagnosis not present

## 2018-02-10 DIAGNOSIS — R69 Illness, unspecified: Secondary | ICD-10-CM | POA: Diagnosis not present

## 2018-02-10 DIAGNOSIS — E119 Type 2 diabetes mellitus without complications: Secondary | ICD-10-CM | POA: Diagnosis not present

## 2018-02-12 DIAGNOSIS — I10 Essential (primary) hypertension: Secondary | ICD-10-CM | POA: Diagnosis not present

## 2018-02-12 DIAGNOSIS — R609 Edema, unspecified: Secondary | ICD-10-CM | POA: Diagnosis not present

## 2018-02-12 DIAGNOSIS — D649 Anemia, unspecified: Secondary | ICD-10-CM | POA: Diagnosis not present

## 2018-02-12 DIAGNOSIS — E119 Type 2 diabetes mellitus without complications: Secondary | ICD-10-CM | POA: Diagnosis not present

## 2018-02-12 DIAGNOSIS — I480 Paroxysmal atrial fibrillation: Secondary | ICD-10-CM | POA: Diagnosis not present

## 2018-02-13 DIAGNOSIS — R69 Illness, unspecified: Secondary | ICD-10-CM | POA: Diagnosis not present

## 2018-02-26 DIAGNOSIS — R609 Edema, unspecified: Secondary | ICD-10-CM | POA: Diagnosis not present

## 2018-02-26 DIAGNOSIS — D649 Anemia, unspecified: Secondary | ICD-10-CM | POA: Diagnosis not present

## 2018-03-02 ENCOUNTER — Encounter: Payer: Self-pay | Admitting: Nurse Practitioner

## 2018-03-02 NOTE — Progress Notes (Addendum)
[facility]  PATIENT: Manuel [name] DOB: September 04, 1945   REASON FOR VISIT: follow up for OSA on CPAP, here for yearly follow-up HISTORY FROM: Patient    HISTORY OF PRESENT ILLNESS:UPDATE 8/12/2019CM Manuel [name], 72 year old male returns for follow-up with history of obstructive sleep apnea here for CPAP compliance.  Compliance data shows significantly however patient says he just got his mask replaced last  Friday.  He is doing well with his CPAP.  Compliance data dated 02/01/2018-03/02/2018 shows compliance greater than 4 hours at 87%.  Average usage 7 hours 6 minutes.  Set pressure 8 cm AHI 2.6.  EPR level 2.  He had colon resection in May now has a colostomy.  He returns for reevaluation     Manuel [name] is a 72 year old right-handed gentleman with an underlying medical history of hypertension, type 2 diabetes, hyperlipidemia, left carotid bruit, arthritis, paroxysmal atrial fibrillation, status post cardioversion on 12/18/2011 and obesity, who presents for follow-up consultation of his OSA, established on CPAP therapy. The patient is unaccompanied today. I last saw him on 02/28/2016, at which time he reported. I suggested a one-year checkup.  Today, 02/28/2017: I reviewed his CPAP compliance data from 01/28/2017 through 02/26/2017, which is a total of 30 days, during which time he used his machine every night with percent used days greater than 4 hours at 100%, indicating superb compliance with an average usage of 7 hours and 42 minutes, residual AHI 1.6 per hour, leak on the high side with the 95th percentile at 33 L/m on a pressure of 8 cm with EPR of 3. He reports doing well. He gets regular supplies. He had a recent bout of bronchitis but is currently doing okay, no fevers, no chest pain, no shortness of breath. He was not treated with any antibiotic. He is working on weight loss and has in fact lost about 9 pounds compared to last year. His blood pressure medicine was  also increased, blood pressure improved as well.   REVIEW OF SYSTEMS: Full 14 system review of systems performed and notable only for those listed, all others are neg:  Constitutional: neg  Cardiovascular: neg Ear/Nose/Throat: neg  Skin: neg Eyes: neg Respiratory: neg Gastroitestinal: neg  Hematology/Lymphatic: neg  Endocrine: neg Musculoskeletal:neg Allergy/Immunology: neg Neurological: neg Psychiatric: neg Sleep : Obstructive sleep apnea with CPAP   ALLERGIES: Allergies  Allergen Reactions  . Sulfa Antibiotics Other (See Comments)    Headaches     HOME MEDICATIONS: Outpatient Medications Prior to Visit  Medication Sig Dispense Refill  . acetaminophen (TYLENOL) 500 MG tablet Take 500 mg by mouth every 6 (six) hours as needed for moderate pain.    . budesonide-formoterol (SYMBICORT) 160-4.5 MCG/ACT inhaler Inhale 2 puffs into the lungs 2 (two) times daily. 1 Inhaler 0  . cyanocobalamin (,VITAMIN B-12,) 1000 MCG/ML injection Inject 1 mL (1,000 mcg total) into the muscle daily. Take 1000 MCG's IM daily x4 days, then 1000 MCG's IM weekly x1 month, then 1000 MCG's IM monthly. (Patient taking differently: Inject 1,000 mcg into the muscle every 30 (thirty) days. Take 1000 MCG's IM daily x4 days, then 1000 MCG's IM weekly x1 month, then 1000 MCG's IM monthly.) 25 mL 0  . diltiazem (CARDIZEM CD) 120 MG 24 hr capsule Take 120 mg by mouth 2 (two) times daily.   5  . doxazosin (CARDURA) 8 MG tablet Take 8 mg by mouth daily.    Marland Kitchen ELIQUIS 5 MG TABS tablet TK 1 T PO BID AFTER FOOD  60 tablet 0  . furosemide (LASIX) 20 MG tablet Take 20 mg by mouth daily.  1  . glimepiride (AMARYL) 1 MG tablet Take 1 mg by mouth daily with breakfast.    . metFORMIN (GLUCOPHAGE) 500 MG tablet Take 500 mg by mouth 2 (two) times daily with a meal.   5  . pravastatin (PRAVACHOL) 20 MG tablet Take 20 mg by mouth daily.     . Probiotic Product (VISBIOME PROBIOTIC HIGH POT) CAPS TAKE 1 CAPSULE BY MOUTH EVERY DAY  6    . Syringe/Needle, Disp, (SYRINGE 3CC/22GX1-1/2") 22G X 1-1/2" 3 ML MISC 1,000 mcg by Does not apply route daily. 50 each 0  . tiotropium (SPIRIVA HANDIHALER) 18 MCG inhalation capsule Place 1 capsule (18 mcg total) into inhaler and inhale daily. (Patient not taking: Reported on 03/03/2018) 30 capsule 0  . metoprolol tartrate (LOPRESSOR) 25 MG tablet Take 1 tablet (25 mg total) by mouth 2 (two) times daily. 60 tablet 0  . pantoprazole (PROTONIX) 40 MG tablet Take 1 tablet (40 mg total) by mouth 2 (two) times daily. Take 1 tablet twice daily x8 weeks, and then 1 tablet daily thereafter. 90 tablet 1  . potassium chloride SA (K-DUR,KLOR-CON) 20 MEQ tablet Take 2 tablets (40 mEq total) by mouth daily for 3 days. 6 tablet 0   No facility-administered medications prior to visit.     PAST MEDICAL HISTORY: Past Medical History:  Diagnosis Date  . A-fib ([place])   . BPH (benign prostatic hyperplasia)   . Diabetes mellitus   . Dizziness and giddiness   . Dyslipidemia   . Dysrhythmia   . Hypertension   . Left carotid bruit   . OSA (obstructive sleep apnea)   . S/P colonoscopy     PAST SURGICAL HISTORY: Past Surgical History:  Procedure Laterality Date  . BIOPSY  12/19/2017   Procedure: BIOPSY;  Surgeon: Carol Ada, MD;  Location: [facility];  Service: Endoscopy;;  . CARDIOVERSION  12/18/2011   Procedure: CARDIOVERSION;  Surgeon: Laverda Page, MD;  Location: [place];  Service: Cardiovascular;  Laterality: N/A;  . COLON RESECTION N/A 12/13/2017   Procedure: LAPAROSCOPIC END COLOSTOMY HARTMAN'S PROCEDURE;  Surgeon: Leighton Ruff, MD;  Location: [facility];  Service: General;  Laterality: N/A;  . CYSTOSTOMY     off back  . ESOPHAGOGASTRODUODENOSCOPY N/A 12/19/2017   Procedure: ESOPHAGOGASTRODUODENOSCOPY (EGD);  Surgeon: Carol Ada, MD;  Location: [facility];  Service: Endoscopy;  Laterality: N/A;  . FLEXIBLE SIGMOIDOSCOPY N/A 12/13/2017   Procedure: FLEXIBLE SIGMOIDOSCOPY;  Surgeon: Carol Ada, MD;  Location: [facility];  Service: Endoscopy;  Laterality: N/A;  . HERNIA REPAIR    . 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 Cancer Father   . Alzheimer's disease Father   . Cancer Mother   . Kidney disease Paternal Aunt     SOCIAL HISTORY: Social History   Socioeconomic History  . Marital status: Married    Spouse name: Not on file  . Number of children: 1  . Years of education: Not on file  . Highest education level: Not on file  Occupational History  . Occupation: Theme park manager    Comment: [employer]  Social Needs  . Financial resource strain: Not on file  . Food insecurity:    Worry: Not on file    Inability: Not on file  . Transportation needs:  Medical: Not on file    Non-medical: Not on file  Tobacco Use  . Smoking status: Former Research scientist (life sciences)  . Smokeless tobacco: Never Used  . Tobacco comment: 12/2014  Substance and Sexual Activity  . Alcohol use: No    Alcohol/week: 0.0 standard drinks  . Drug use: No  . Sexual activity: Not on file  Lifestyle  . Physical activity:    Days per week: Not on file    Minutes per session: Not on file  . Stress: Not on file  Relationships  . Social connections:    Talks on phone: Not on file    Gets together: Not on file    Attends religious service: Not on file    Active member of club or organization: Not on file    Attends meetings of clubs or organizations: Not on file    Relationship status: Not on file  . Intimate partner violence:    Fear of current or ex partner: Not on file    Emotionally abused: Not on file    Physically abused: Not on file    Forced sexual activity: Not on file  Other Topics Concern  . Not on file  Social History Narrative   Drinks 2-3 caffeine drinks a day      PHYSICAL EXAM  Vitals:   03/03/18 0927  BP: 102/60  Pulse: 86  SpO2: 95%  Weight: 209 lb 6.4 oz (95 kg)  Height: _0  (1.905 m)    Body mass index is 26.17 kg/m.  Generalized: Well developed, in no acute distress  Head: normocephalic and atraumatic,. Oropharynx benign  Neck: Supple,  Musculoskeletal: No deformity   Neurological examination   Mentation: Alert oriented to time, place, history taking. Attention span and concentration appropriate. Recent and remote memory intact.  Follows all commands speech and language fluent.   Cranial nerve II-XII: Pupils were equal round reactive to light extraocular movements were full, visual field were full on confrontational test. Facial sensation and strength were normal. hearing was intact to finger rubbing bilaterally. Uvula tongue midline. head turning and shoulder shrug were normal and symmetric.Tongue protrusion into cheek strength was normal. Motor: normal bulk and tone, full strength in the BUE, BLE,  Sensory: normal and symmetric to light touch,  Coordination: finger-nose-finger, heel-to-shin bilaterally, no dysmetria Reflexes: Symmetric upper and lower plantar responses were flexor bilaterally. Gait and Station: Rising up from seated position without assistance, normal stance,  moderate stride, good arm swing, smooth turning, able to perform tiptoe, and heel walking without difficulty.  No assistive device  DIAGNOSTIC DATA (LABS, IMAGING, TESTING) - I reviewed patient records, labs, notes, testing and imaging myself where available.  Lab Results  Component Value Date   WBC 4.1 01/07/2018   HGB 9.7 (L) 01/07/2018   HCT 31.5 (L) 01/07/2018   MCV 93.2 01/07/2018   PLT 229 01/07/2018      Component Value Date/Time   NA 143 01/06/2018 0430   K 3.8 01/06/2018 0430   CL 106 01/06/2018 0430   CO2 31 01/06/2018 0430   GLUCOSE 119 (H) 01/06/2018 0430   BUN 7 01/06/2018 0430   CREATININE 0.86 01/06/2018 0430   CREATININE 1.23 04/10/2011 1307   CALCIUM 7.8 (L) 01/06/2018 0430   PROT 6.0 (L) 01/02/2018 1639   ALBUMIN 3.1 (L) 01/02/2018 1639   AST 14 (L)  01/02/2018 1639   ALT 15 (L) 01/02/2018 1639   ALKPHOS 78 01/02/2018 1639   BILITOT 1.0 01/02/2018 1639   GFRNONAA >  60 01/06/2018 0430   GFRAA >60 01/06/2018 0430    Lab Results  Component Value Date   HGBA1C 6.4 (H) 12/19/2017   Lab Results  Component Value Date   VITAMINB12 127 (L) 12/19/2017    ASSESSMENT AND PLAN  72 y.o. year old male  has a past medical history of A-fib ([place]), BPH (benign prostatic hyperplasia), Diabetes mellitus, Dizziness and giddiness, Dyslipidemia, Dysrhythmia, Hypertension, Left carotid bruit, OSA (obstructive sleep apnea), and S/P colonoscopy. here to follow-up for CPAP compliance . Data dated 02/01/2018-03/02/2018 shows compliance greater than 4 hours at 87%.  Average usage 7 hours 6 minutes.  Set pressure 8 cm AHI 2.6.  EPR level 2.  Leak 95th percentile 54.7, patient claims he just got a new mask last week which no longer leaks.   PLAN: CPAP compliance 87% Continue same settings Follow-up yearly I spent 20 minutes in total face-to-face time with the patient, more than 50% of which was spent in counseling and coordination of care, reviewing test results, reviewing medication and discussing or reviewing the diagnosis of OSA, its prognosis and treatment options Manuel Bible, [facility], [facility], APRN  [facility] [address] [place], [place] [phone]  I reviewed the above note and documentation by the Nurse Practitioner and agree with the history, physical exam, assessment and plan as outlined above. I was immediately available for face-to-face consultation. Star Age, MD, PhD [facility])

## 2018-03-03 ENCOUNTER — Encounter: Payer: Self-pay | Admitting: Nurse Practitioner

## 2018-03-03 ENCOUNTER — Ambulatory Visit: Payer: Medicare HMO | Admitting: Nurse Practitioner

## 2018-03-03 VITALS — BP 102/60 | HR 86 | Ht 75.0 in | Wt 209.4 lb

## 2018-03-03 DIAGNOSIS — G4733 Obstructive sleep apnea (adult) (pediatric): Secondary | ICD-10-CM | POA: Diagnosis not present

## 2018-03-03 NOTE — Patient Instructions (Signed)
CPAP compliance 87% Continue same settings Follow-up yearly

## 2018-03-05 DIAGNOSIS — Z7189 Other specified counseling: Secondary | ICD-10-CM | POA: Diagnosis not present

## 2018-03-05 DIAGNOSIS — E7800 Pure hypercholesterolemia, unspecified: Secondary | ICD-10-CM | POA: Diagnosis not present

## 2018-03-05 DIAGNOSIS — I10 Essential (primary) hypertension: Secondary | ICD-10-CM | POA: Diagnosis not present

## 2018-03-05 DIAGNOSIS — E119 Type 2 diabetes mellitus without complications: Secondary | ICD-10-CM | POA: Diagnosis not present

## 2018-03-13 DIAGNOSIS — G4733 Obstructive sleep apnea (adult) (pediatric): Secondary | ICD-10-CM | POA: Diagnosis not present

## 2018-03-14 DIAGNOSIS — I714 Abdominal aortic aneurysm, without rupture: Secondary | ICD-10-CM | POA: Diagnosis not present

## 2018-03-14 DIAGNOSIS — I482 Chronic atrial fibrillation: Secondary | ICD-10-CM | POA: Diagnosis not present

## 2018-03-14 DIAGNOSIS — G4733 Obstructive sleep apnea (adult) (pediatric): Secondary | ICD-10-CM | POA: Diagnosis not present

## 2018-03-14 DIAGNOSIS — E119 Type 2 diabetes mellitus without complications: Secondary | ICD-10-CM | POA: Diagnosis not present

## 2018-03-21 DIAGNOSIS — N182 Chronic kidney disease, stage 2 (mild): Secondary | ICD-10-CM | POA: Diagnosis not present

## 2018-03-21 DIAGNOSIS — N400 Enlarged prostate without lower urinary tract symptoms: Secondary | ICD-10-CM | POA: Diagnosis not present

## 2018-03-21 DIAGNOSIS — N189 Chronic kidney disease, unspecified: Secondary | ICD-10-CM | POA: Diagnosis not present

## 2018-03-21 DIAGNOSIS — D631 Anemia in chronic kidney disease: Secondary | ICD-10-CM | POA: Diagnosis not present

## 2018-03-21 DIAGNOSIS — I129 Hypertensive chronic kidney disease with stage 1 through stage 4 chronic kidney disease, or unspecified chronic kidney disease: Secondary | ICD-10-CM | POA: Diagnosis not present

## 2018-03-21 DIAGNOSIS — E785 Hyperlipidemia, unspecified: Secondary | ICD-10-CM | POA: Diagnosis not present

## 2018-03-21 DIAGNOSIS — E1122 Type 2 diabetes mellitus with diabetic chronic kidney disease: Secondary | ICD-10-CM | POA: Diagnosis not present

## 2018-03-21 DIAGNOSIS — N183 Chronic kidney disease, stage 3 (moderate): Secondary | ICD-10-CM | POA: Diagnosis not present

## 2018-03-21 DIAGNOSIS — G4733 Obstructive sleep apnea (adult) (pediatric): Secondary | ICD-10-CM | POA: Diagnosis not present

## 2018-03-21 DIAGNOSIS — R809 Proteinuria, unspecified: Secondary | ICD-10-CM | POA: Diagnosis not present

## 2018-03-21 DIAGNOSIS — N2581 Secondary hyperparathyroidism of renal origin: Secondary | ICD-10-CM | POA: Diagnosis not present

## 2018-03-21 DIAGNOSIS — Z23 Encounter for immunization: Secondary | ICD-10-CM | POA: Diagnosis not present

## 2018-03-31 DIAGNOSIS — Z933 Colostomy status: Secondary | ICD-10-CM | POA: Diagnosis not present

## 2018-04-03 DIAGNOSIS — Z86010 Personal history of colonic polyps: Secondary | ICD-10-CM | POA: Diagnosis not present

## 2018-04-03 DIAGNOSIS — K5730 Diverticulosis of large intestine without perforation or abscess without bleeding: Secondary | ICD-10-CM | POA: Diagnosis not present

## 2018-04-08 DIAGNOSIS — N183 Chronic kidney disease, stage 3 (moderate): Secondary | ICD-10-CM | POA: Diagnosis not present

## 2018-04-08 DIAGNOSIS — N400 Enlarged prostate without lower urinary tract symptoms: Secondary | ICD-10-CM | POA: Diagnosis not present

## 2018-04-08 DIAGNOSIS — D631 Anemia in chronic kidney disease: Secondary | ICD-10-CM | POA: Diagnosis not present

## 2018-04-08 DIAGNOSIS — E785 Hyperlipidemia, unspecified: Secondary | ICD-10-CM | POA: Diagnosis not present

## 2018-04-08 DIAGNOSIS — E1122 Type 2 diabetes mellitus with diabetic chronic kidney disease: Secondary | ICD-10-CM | POA: Diagnosis not present

## 2018-04-08 DIAGNOSIS — I129 Hypertensive chronic kidney disease with stage 1 through stage 4 chronic kidney disease, or unspecified chronic kidney disease: Secondary | ICD-10-CM | POA: Diagnosis not present

## 2018-04-08 DIAGNOSIS — N2581 Secondary hyperparathyroidism of renal origin: Secondary | ICD-10-CM | POA: Diagnosis not present

## 2018-04-08 DIAGNOSIS — R809 Proteinuria, unspecified: Secondary | ICD-10-CM | POA: Diagnosis not present

## 2018-04-08 DIAGNOSIS — I4891 Unspecified atrial fibrillation: Secondary | ICD-10-CM | POA: Diagnosis not present

## 2018-04-08 DIAGNOSIS — G4733 Obstructive sleep apnea (adult) (pediatric): Secondary | ICD-10-CM | POA: Diagnosis not present

## 2018-04-14 DIAGNOSIS — G4733 Obstructive sleep apnea (adult) (pediatric): Secondary | ICD-10-CM | POA: Diagnosis not present

## 2018-04-18 ENCOUNTER — Ambulatory Visit: Payer: Self-pay | Admitting: General Surgery

## 2018-04-18 DIAGNOSIS — Z933 Colostomy status: Secondary | ICD-10-CM | POA: Diagnosis not present

## 2018-04-18 NOTE — H&P (Signed)
History of Present Illness Leighton Ruff MD; 11/22/7739 4:54 PM) The patient is a 72 year old male presenting for a post-operative visit. 73 year old male who underwent a urgent Hartman's procedure for colon obstruction due to diverticular disease. He was unable to get a colonoscopy due to his stricture. His postop recovery was complicated by a bleeding gastric ulcer and then a postop small bowel obstruction which resolved with hospitalization and NG decompression. He is now back on his anticoagulation and tolerating a diet. He is having regular bowel function. He has no complaints. His colonoscopy showed no further pathology.    Problem List/Past Medical Leighton Ruff, MD; 2/87/8676 4:54 PM) SUBCUTANEOUS MASS OF BACK (R22.2) ENCOUNTER FOR REMOVAL OF STAPLES (Z48.02) STATUS POST HARTMANN PROCEDURE (Z93.3)  Past Surgical History Leighton Ruff, MD; 02/08/9469 4:54 PM) Cataract Surgery Left. Colon Polyp Removal - Colonoscopy Colon Polyp Removal - Open Vasectomy  Allergies (Tanisha A. Owens Shark, [place]; 04/18/2018 4:16 PM) Sulfa Antibiotics Allergies Reconciled  Medication History Leighton Ruff, MD; 9/62/8366 4:54 PM) DilTIAZem HCl ER Coated Beads (120MG Capsule ER 24HR, Oral) Active. Doxazosin Mesylate (8MG Tablet, Oral) Active. Eliquis (5MG Tablet, Oral) Active. Glimepiride (1MG Tablet, Oral) Active. Lisinopril (20MG Tablet, Oral) Active. MetFORMIN HCl (500MG Tablet, Oral) Active. Pravastatin Sodium (20MG Tablet, Oral) Active. Medications Reconciled Neomycin Sulfate (500MG Tablet, 2 (two) Oral SEE NOTE, Taken starting 04/18/2018) Active. (TAKE TWO TABLETS AT 2 PM, 3 PM, AND 10 PM THE DAY PRIOR TO SURGERY) Flagyl (500MG Tablet, 2 (two) Oral SEE NOTE, Taken starting 04/18/2018) Active. (Take at 2pm, 3pm, and 10pm the day prior to your colon operation)  Social History Leighton Ruff, MD; 2/94/7654 4:54 PM) Alcohol use Occasional alcohol use. Caffeine use  Coffee. No drug use  Family History Leighton Ruff, MD; 6/50/3546 4:54 PM) Arthritis Father. Diabetes Mellitus Brother. Hypertension Brother. Kidney Disease Father. Malignant Neoplasm Of Pancreas Brother.  Other Problems Leighton Ruff, MD; 5/68/1275 4:54 PM) Arthritis Back Pain Diabetes Mellitus Enlarged Prostate High blood pressure Hypercholesterolemia Sleep Apnea     Review of Systems Leighton Ruff MD; 1/70/0174 4:54 PM) HEENT Present- Seasonal Allergies and Wears glasses/contact lenses. Not Present- Earache, Hearing Loss, Hoarseness, Nose Bleed, Oral Ulcers, Ringing in the Ears, Sinus Pain, Sore Throat, Visual Disturbances and Yellow Eyes. Respiratory Present- Snoring. Not Present- Bloody sputum, Chronic Cough, Difficulty Breathing and Wheezing. Gastrointestinal Present- Excessive gas. Not Present- Abdominal Pain, Bloating, Bloody Stool, Change in Bowel Habits, Chronic diarrhea, Constipation, Difficulty Swallowing, Gets full quickly at meals, Hemorrhoids, Indigestion, Nausea, Rectal Pain and Vomiting. Male Genitourinary Present- Nocturia and Urgency. Not Present- Blood in Urine, Change in Urinary Stream, Frequency, Impotence, Painful Urination and Urine Leakage.  Vitals (Tanisha A. Brown RMA; 04/18/2018 4:16 PM) 04/18/2018 4:15 PM Weight: 207.8 lb Height: 74in Body Surface Area: 2.21 m Body Mass Index: 26.68 kg/m  Temp.: 97.25F  BP: 136/82 (Sitting, Left Arm, Standard)      Physical Exam Leighton Ruff MD; 9/44/9675 4:56 PM)  General Mental Status-Alert. General Appearance-Not in acute distress. Build & Nutrition-Well nourished. Posture-Normal posture. Gait-Normal.  Head and Neck Head-normocephalic, atraumatic with no lesions or palpable masses. Trachea-midline.  Chest and Lung Exam Chest and lung exam reveals -on auscultation, normal breath sounds, no adventitious sounds and normal vocal  resonance.  Cardiovascular Cardiovascular examination reveals -normal heart sounds, regular rate and rhythm with no murmurs and no digital clubbing, cyanosis, edema, increased warmth or tenderness.  Abdomen Inspection Inspection of the abdomen reveals - No Hernias. Palpation/Percussion Palpation and Percussion of the abdomen reveal -  Soft, Non Tender, No Rigidity (guarding), No hepatosplenomegaly and No Palpable abdominal masses.  Neurologic Neurologic evaluation reveals -alert and oriented x 3 with no impairment of recent or remote memory, normal attention span and ability to concentrate, normal sensation and normal coordination.  Musculoskeletal Normal Exam - Bilateral-Upper Extremity Strength Normal and Lower Extremity Strength Normal.    Assessment & Plan Leighton Ruff MD; 8/88/9169 4:39 PM)  STATUS POST HARTMANN PROCEDURE (Z93.3) Impression: 72 year old male who presents to the office for evaluation for colostomy reversal. He underwent surgery in May 2019 for an obstructed sigmoid colon mass. This turned out to be a diverticular stricture. He was unable to be reconnected at that time and he was given a left upper quadrant colostomy. He was readmitted for a gastric ulcer with bleeding as well as a early postop small bowel obstruction. Both of these have now resolved and he is back on his anticoagulation. He underwent a colonoscopy which showed no other pathologic findings except for diverticulosis. He is ready for colostomy reversal. We will touch base with his cardiologist regarding his Evans Lance and get him scheduled for surgery most likely in late October or early November. The surgery and anatomy were described to the patient as well as the risks of surgery and the possible complications. These include: Bleeding, deep abdominal infections and possible wound complications such as hernia and infection, damage to adjacent structures, leak of surgical connections, which can lead  to other surgeries and possibly an ostomy, possible need for other procedures, such as abscess drains in radiology, possible prolonged hospital stay, possible diarrhea from removal of part of the colon, possible constipation from narcotics, possible bowel, bladder or sexual dysfunction if having rectal surgery, prolonged fatigue/weakness or appetite loss, possible early recurrence of of disease, possible complications of their medical problems such as heart disease or arrhythmias or lung problems, death (less than 1%). I believe the patient understands and wishes to proceed with the surgery.

## 2018-04-30 DIAGNOSIS — Z933 Colostomy status: Secondary | ICD-10-CM | POA: Diagnosis not present

## 2018-05-12 DIAGNOSIS — E785 Hyperlipidemia, unspecified: Secondary | ICD-10-CM | POA: Diagnosis not present

## 2018-05-12 DIAGNOSIS — I4891 Unspecified atrial fibrillation: Secondary | ICD-10-CM | POA: Diagnosis not present

## 2018-05-12 DIAGNOSIS — I129 Hypertensive chronic kidney disease with stage 1 through stage 4 chronic kidney disease, or unspecified chronic kidney disease: Secondary | ICD-10-CM | POA: Diagnosis not present

## 2018-05-12 DIAGNOSIS — N189 Chronic kidney disease, unspecified: Secondary | ICD-10-CM | POA: Diagnosis not present

## 2018-05-12 DIAGNOSIS — N2581 Secondary hyperparathyroidism of renal origin: Secondary | ICD-10-CM | POA: Diagnosis not present

## 2018-05-12 DIAGNOSIS — N400 Enlarged prostate without lower urinary tract symptoms: Secondary | ICD-10-CM | POA: Diagnosis not present

## 2018-05-12 DIAGNOSIS — R809 Proteinuria, unspecified: Secondary | ICD-10-CM | POA: Diagnosis not present

## 2018-05-12 DIAGNOSIS — G4733 Obstructive sleep apnea (adult) (pediatric): Secondary | ICD-10-CM | POA: Diagnosis not present

## 2018-05-12 DIAGNOSIS — D631 Anemia in chronic kidney disease: Secondary | ICD-10-CM | POA: Diagnosis not present

## 2018-05-12 DIAGNOSIS — E1122 Type 2 diabetes mellitus with diabetic chronic kidney disease: Secondary | ICD-10-CM | POA: Diagnosis not present

## 2018-05-12 DIAGNOSIS — N183 Chronic kidney disease, stage 3 (moderate): Secondary | ICD-10-CM | POA: Diagnosis not present

## 2018-05-15 DIAGNOSIS — G4733 Obstructive sleep apnea (adult) (pediatric): Secondary | ICD-10-CM | POA: Diagnosis not present

## 2018-05-19 NOTE — Patient Instructions (Addendum)
Manuel [name]  05/19/2018     Your procedure is scheduled on:  05-29-2018   Report to [facility] Main  Entrance, Report to admitting at  5:30 AM      Call this number if you have problems the morning of surgery [phone]     Remember: DRINK 2 [place] AT 10:00 PM AND 1 PRESURGERY DRINK THE DAY OF THE PROCEDURE 3 HOURS PRIOR TO SCHEDULED SURGERY.                         NO SOLIDS AFTER MIDNIGHT THE DAY PRIOR TO THE SURGERY. NOTHING BY MOUTH EXCEPT CLEAR LIQUID DIET FROM MIDNIGHT  UNTIL THREE HOURS PRIOR TO SCHEDULED SURGERY 4:30 AM.                         PLEASE FINISH PRESURGERY ENSURE DRINK PER SURGEON ORDER 3 HOURS PRIOR TO SCHEDULED SURGERY TIME WHICH NEEDS TO BE COMPLETED AT ___4:30 AM__.                         BRUSH YOUR TEETH MORNING OF SURGERY AND RINSE YOUR MOUTH OUT, NO CHEWING GUM CANDY OR MINTS.      Take these medicines the morning of surgery with A SIP OF WATER:   Diltiazem,   Symbicort inhaler as needed and bring with you day of surgery                DO NOT TAKE ANY DIABETIC MEDICATIONS DAY OF YOUR SURGERY                                  You may not have any metal on your body including hair pins and              piercings                Do not wear jewelry, make-up, lotions, powders or perfumes, deodorant                          Men may shave face and neck.     Do not bring valuables to the hospital. [place].  Contacts, dentures or bridgework may not be worn into surgery.  Leave suitcase in the car. After surgery it may be brought to your room.      Special Instructions:   BRING CPAP MASK AND TUBING WITH YOU DAY OF SURGERY                                                           BRING OSTOMY SUPPLIES                   _____________________________________________________________________      CLEAR LIQUID  DIET   Foods Allowed  Foods Excluded  Coffee and tea, regular and decaf                             liquids that you cannot  Plain Jell-O in any flavor                                             see through such as: Fruit ices (not with fruit pulp)                                     milk, soups, orange juice  Iced Popsicles                                    All solid food Carbonated beverages, regular and diet                                    Cranberry, grape and apple juices Sports drinks like Gatorade Lightly seasoned clear broth or consume(fat free) Sugar, honey syrup  Sample Menu Breakfast                                Lunch                                     Supper Cranberry juice                    Beef broth                            Chicken broth Jell-O                                     Grape juice                           Apple juice Coffee or tea                        Jell-O                                      Popsicle                                                Coffee or tea                        Coffee or tea  _____________________________________________________________________    Incentive Spirometer  An incentive spirometer is a tool that can help keep your lungs clear and active. This tool measures  how well you are filling your lungs with each breath. Taking long deep breaths may help reverse or decrease the chance of developing breathing (pulmonary) problems (especially infection) following:  A long period of time when you are unable to move or be active. BEFORE THE PROCEDURE   If the spirometer includes an indicator to show your best effort, your nurse or respiratory therapist will set it to a desired goal.  If possible, sit up straight or lean slightly forward. Try not to slouch.  Hold the incentive spirometer in an upright position. INSTRUCTIONS FOR USE  1. Sit on the edge of  your bed if possible, or sit up as far as you can in bed or on a chair. 2. Hold the incentive spirometer in an upright position. 3. Breathe out normally. 4. Place the mouthpiece in your mouth and seal your lips tightly around it. 5. Breathe in slowly and as deeply as possible, raising the piston or the ball toward the top of the column. 6. Hold your breath for 3-5 seconds or for as long as possible. Allow the piston or ball to fall to the bottom of the column. 7. Remove the mouthpiece from your mouth and breathe out normally. 8. Rest for a few seconds and repeat Steps 1 through 7 at least 10 times every 1-2 hours when you are awake. Take your time and take a few normal breaths between deep breaths. 9. The spirometer may include an indicator to show your best effort. Use the indicator as a goal to work toward during each repetition. 10. After each set of 10 deep breaths, practice coughing to be sure your lungs are clear. If you have an incision (the cut made at the time of surgery), support your incision when coughing by placing a pillow or rolled up towels firmly against it. Once you are able to get out of bed, walk around indoors and cough well. You may stop using the incentive spirometer when instructed by your caregiver.  RISKS AND COMPLICATIONS  Take your time so you do not get dizzy or light-headed.  If you are in pain, you may need to take or ask for pain medication before doing incentive spirometry. It is harder to take a deep breath if you are having pain. AFTER USE  Rest and breathe slowly and easily.  It can be helpful to keep track of a log of your progress. Your caregiver can provide you with a simple table to help with this. If you are using the spirometer at home, follow these instructions: [place] IF:   You are having difficultly using the spirometer.  You have trouble using the spirometer as often as instructed.  Your pain medication is not giving enough relief  while using the spirometer.  You develop fever of 100.5 F (38.1 C) or higher. SEEK IMMEDIATE MEDICAL CARE IF:   You cough up bloody sputum that had not been present before.  You develop fever of 102 F (38.9 C) or greater.  You develop worsening pain at or near the incision site. MAKE SURE YOU:   Understand these instructions.  Will watch your condition.  Will get help right away if you are not doing well or get worse. Document Released: 11/19/2006 Document Revised: 10/01/2011 Document Reviewed: 01/20/2007 ExitCare Patient Information 2014 [place].   ________________________________________________________________________ [facility] - Preparing for Surgery Before surgery, you can play an important role.  Because skin is not sterile, your skin needs to be as free of  germs as possible.  You can reduce the number of germs on your skin by washing with CHG (chlorahexidine gluconate) soap before surgery.  CHG is an antiseptic cleaner which kills germs and bonds with the skin to continue killing germs even after washing. Please DO NOT use if you have an allergy to CHG or antibacterial soaps.  If your skin becomes reddened/irritated stop using the CHG and inform your nurse when you arrive at Short Stay. Do not shave (including legs and underarms) for at least 48 hours prior to the first CHG shower.  You may shave your face/neck. Please follow these instructions carefully:  1.  Shower with CHG Soap the night before surgery and the  morning of Surgery.  2.  If you choose to wash your hair, wash your hair first as usual with your  normal  shampoo.  3.  After you shampoo, rinse your hair and body thoroughly to remove the  shampoo.                           4.  Use CHG as you would any other liquid soap.  You can apply chg directly  to the skin and wash                       Gently with a scrungie or clean washcloth.  5.  Apply the CHG Soap to your body ONLY FROM THE NECK DOWN.   Do  not use on face/ open                           Wound or open sores. Avoid contact with eyes, ears mouth and genitals (private parts).                       Wash face,  Genitals (private parts) with your normal soap.             6.  Wash thoroughly, paying special attention to the area where your surgery  will be performed.  7.  Thoroughly rinse your body with warm water from the neck down.  8.  DO NOT shower/wash with your normal soap after using and rinsing off  the CHG Soap.             9.  Pat yourself dry with a clean towel.            10.  Wear clean pajamas.            11.  Place clean sheets on your bed the night of your first shower and do not  sleep with pets. Day of Surgery : Do not apply any lotions/deodorants the morning of surgery.  Please wear clean clothes to the hospital/surgery center.  FAILURE TO FOLLOW THESE INSTRUCTIONS MAY RESULT IN THE CANCELLATION OF YOUR SURGERY PATIENT SIGNATURE_________________________________  NURSE SIGNATURE__________________________________  ________________________________________________________________________

## 2018-05-21 ENCOUNTER — Encounter (HOSPITAL_COMMUNITY): Payer: Self-pay

## 2018-05-21 ENCOUNTER — Encounter (HOSPITAL_COMMUNITY)
Admission: RE | Admit: 2018-05-21 | Discharge: 2018-05-21 | Disposition: A | Discharge status: Home / Self Care | Payer: Medicare HMO | Source: Ambulatory Visit | Attending: General Surgery | Admitting: General Surgery

## 2018-05-21 ENCOUNTER — Other Ambulatory Visit: Payer: Self-pay

## 2018-05-21 DIAGNOSIS — Z01812 Encounter for preprocedural laboratory examination: Secondary | ICD-10-CM | POA: Insufficient documentation

## 2018-05-21 DIAGNOSIS — Z933 Colostomy status: Secondary | ICD-10-CM | POA: Insufficient documentation

## 2018-05-21 HISTORY — DX: Reserved for concepts with insufficient information to code with codable children: IMO0002

## 2018-05-21 HISTORY — DX: Presence of spectacles and contact lenses: Z97.3

## 2018-05-21 HISTORY — DX: Anemia in chronic kidney disease: D63.1

## 2018-05-21 HISTORY — DX: Permanent atrial fibrillation: I48.21

## 2018-05-21 HISTORY — DX: Chronic kidney disease, unspecified: N18.9

## 2018-05-21 HISTORY — DX: Chronic kidney disease, stage 3 (moderate): N18.3

## 2018-05-21 HISTORY — DX: Chronic kidney disease, stage 3 unspecified: N18.30

## 2018-05-21 HISTORY — DX: Other seasonal allergic rhinitis: J30.2

## 2018-05-21 HISTORY — DX: Type 2 diabetes mellitus without complications: E11.9

## 2018-05-21 HISTORY — DX: Presence of dental prosthetic device (complete) (partial): Z97.2

## 2018-05-21 HISTORY — DX: Deficiency of other specified B group vitamins: E53.8

## 2018-05-21 HISTORY — DX: Dependence on other enabling machines and devices: Z99.89

## 2018-05-21 HISTORY — DX: Secondary hyperparathyroidism of renal origin: N25.81

## 2018-05-21 HISTORY — DX: Obstructive sleep apnea (adult) (pediatric): G47.33

## 2018-05-21 HISTORY — DX: Colostomy status: Z93.3

## 2018-05-21 HISTORY — DX: Abdominal aortic aneurysm, without rupture: I71.4

## 2018-05-21 HISTORY — DX: Abdominal aortic aneurysm, without rupture, unspecified: I71.40

## 2018-05-21 HISTORY — DX: Acquired absence of other specified parts of digestive tract: Z90.49

## 2018-05-21 HISTORY — DX: Renal agenesis, unilateral: Q60.0

## 2018-05-21 HISTORY — DX: Unspecified osteoarthritis, unspecified site: M19.90

## 2018-05-21 HISTORY — DX: Long term (current) use of anticoagulants: Z79.01

## 2018-05-21 LAB — CBC
HEMATOCRIT: 41 % (ref 39.0–52.0)
HEMOGLOBIN: 11.5 g/dL — AB (ref 13.0–17.0)
MCH: 24.1 pg — ABNORMAL LOW (ref 26.0–34.0)
MCHC: 28 g/dL — ABNORMAL LOW (ref 30.0–36.0)
MCV: 85.8 fL (ref 80.0–100.0)
Platelets: 214 10*3/uL (ref 150–400)
RBC: 4.78 MIL/uL (ref 4.22–5.81)
RDW: 20.4 % — ABNORMAL HIGH (ref 11.5–15.5)
WBC: 4 10*3/uL (ref 4.0–10.5)
nRBC: 0 % (ref 0.0–0.2)

## 2018-05-21 LAB — BASIC METABOLIC PANEL
Anion gap: 5 (ref 5–15)
BUN: 15 mg/dL (ref 8–23)
CHLORIDE: 106 mmol/L (ref 98–111)
CO2: 31 mmol/L (ref 22–32)
Calcium: 8.5 mg/dL — ABNORMAL LOW (ref 8.9–10.3)
Creatinine, Ser: 1.21 mg/dL (ref 0.61–1.24)
GFR calc non Af Amer: 58 mL/min — ABNORMAL LOW (ref >60)
Glucose, Bld: 142 mg/dL — ABNORMAL HIGH (ref 70–99)
POTASSIUM: 3.7 mmol/L (ref 3.5–5.1)
SODIUM: 142 mmol/L (ref 135–145)

## 2018-05-21 LAB — GLUCOSE, CAPILLARY: Glucose-Capillary: 131 mg/dL — ABNORMAL HIGH (ref 70–99)

## 2018-05-21 LAB — HEMOGLOBIN A1C
HEMOGLOBIN A1C: 6.5 % — AB (ref 4.8–5.6)
MEAN PLASMA GLUCOSE: 139.85 mg/dL

## 2018-05-21 NOTE — Progress Notes (Addendum)
EKG dated 01-03-2018 in epic. Per pt was given instruction by dr Marcello Moores to stop eliquis two days prior to [facility] and start back two after surgery.  ADDENDUM:  Requested and received via fax lov notes from dr Einar Gip (cardiologist), dated 03-14-2018, and dr detarding (nephrologist), dated 05-12-2018.  Both in chart.

## 2018-05-28 MED ORDER — BUPIVACAINE LIPOSOME 1.3 % IJ SUSP
20.0000 mL | INTRAMUSCULAR | Status: DC
  Filled 2018-05-28: qty 20

## 2018-05-29 ENCOUNTER — Inpatient Hospital Stay (HOSPITAL_COMMUNITY)
Admission: RE | Admit: 2018-05-29 | Discharge: 2018-06-02 | DRG: 331 | Disposition: A | Discharge status: Home / Self Care | Payer: Medicare HMO | Source: Ambulatory Visit | Attending: General Surgery | Admitting: General Surgery

## 2018-05-29 ENCOUNTER — Encounter (HOSPITAL_COMMUNITY)
Admission: RE | Disposition: A | Discharge status: Home / Self Care | Payer: Self-pay | Source: Ambulatory Visit | Attending: General Surgery

## 2018-05-29 ENCOUNTER — Inpatient Hospital Stay (HOSPITAL_COMMUNITY): Payer: Medicare HMO | Admitting: Certified Registered Nurse Anesthetist

## 2018-05-29 ENCOUNTER — Other Ambulatory Visit: Payer: Self-pay

## 2018-05-29 ENCOUNTER — Encounter (HOSPITAL_COMMUNITY): Payer: Self-pay | Admitting: Emergency Medicine

## 2018-05-29 DIAGNOSIS — Z85038 Personal history of other malignant neoplasm of large intestine: Secondary | ICD-10-CM | POA: Diagnosis not present

## 2018-05-29 DIAGNOSIS — C189 Malignant neoplasm of colon, unspecified: Secondary | ICD-10-CM

## 2018-05-29 DIAGNOSIS — Z433 Encounter for attention to colostomy: Principal | ICD-10-CM

## 2018-05-29 HISTORY — DX: Malignant neoplasm of colon, unspecified: C18.9

## 2018-05-29 HISTORY — PX: COLOSTOMY TAKEDOWN: SHX5258

## 2018-05-29 LAB — GLUCOSE, CAPILLARY
Glucose-Capillary: 124 mg/dL — ABNORMAL HIGH (ref 70–99)
Glucose-Capillary: 121 mg/dL — ABNORMAL HIGH (ref 70–99)

## 2018-05-29 SURGERY — CLOSURE, COLOSTOMY, LAPAROSCOPIC
Anesthesia: General

## 2018-05-29 MED ORDER — SACCHAROMYCES BOULARDII 250 MG PO CAPS
250.0000 mg | ORAL_CAPSULE | Freq: Two times a day (BID) | ORAL | Status: DC
  Administered 2018-05-29 – 2018-06-02 (×8): 250 mg via ORAL
  Filled 2018-05-29 (×8): qty 1

## 2018-05-29 MED ORDER — LACTATED RINGERS IR SOLN
Status: DC | PRN
  Administered 2018-05-29: 1000 mL

## 2018-05-29 MED ORDER — ENOXAPARIN SODIUM 40 MG/0.4ML ~~LOC~~ SOLN
40.0000 mg | SUBCUTANEOUS | Status: DC
  Administered 2018-05-30 – 2018-05-31 (×2): 40 mg via SUBCUTANEOUS
  Filled 2018-05-29 (×2): qty 0.4

## 2018-05-29 MED ORDER — METOPROLOL TARTRATE 5 MG/5ML IV SOLN
INTRAVENOUS | Status: DC | PRN
  Administered 2018-05-29: 2 mg via INTRAVENOUS

## 2018-05-29 MED ORDER — LIDOCAINE HCL 2 % IJ SOLN
INTRAMUSCULAR | Status: AC
  Filled 2018-05-29: qty 20

## 2018-05-29 MED ORDER — EPHEDRINE SULFATE-NACL 50-0.9 MG/10ML-% IV SOSY
PREFILLED_SYRINGE | INTRAVENOUS | Status: DC | PRN
  Administered 2018-05-29 (×2): 5 mg via INTRAVENOUS

## 2018-05-29 MED ORDER — MIDAZOLAM HCL 2 MG/2ML IJ SOLN
INTRAMUSCULAR | Status: AC
  Filled 2018-05-29: qty 2

## 2018-05-29 MED ORDER — DEXAMETHASONE SODIUM PHOSPHATE 10 MG/ML IJ SOLN
INTRAMUSCULAR | Status: AC
  Filled 2018-05-29: qty 1

## 2018-05-29 MED ORDER — PHENYLEPHRINE HCL 10 MG/ML IJ SOLN
INTRAMUSCULAR | Status: AC
  Filled 2018-05-29: qty 1

## 2018-05-29 MED ORDER — LIDOCAINE 2% (20 MG/ML) 5 ML SYRINGE
INTRAMUSCULAR | Status: DC | PRN
  Administered 2018-05-29: 1 mg/kg/h via INTRAVENOUS

## 2018-05-29 MED ORDER — ESMOLOL HCL 100 MG/10ML IV SOLN
INTRAVENOUS | Status: DC | PRN
  Administered 2018-05-29 (×2): 20 mg via INTRAVENOUS

## 2018-05-29 MED ORDER — ONDANSETRON HCL 4 MG PO TABS: 4.0000 mg | ORAL_TABLET | Freq: Four times a day (QID) | ORAL | Status: DC | PRN

## 2018-05-29 MED ORDER — DEXAMETHASONE SODIUM PHOSPHATE 10 MG/ML IJ SOLN
INTRAMUSCULAR | Status: DC | PRN
  Administered 2018-05-29: 8 mg via INTRAVENOUS

## 2018-05-29 MED ORDER — ESMOLOL HCL 100 MG/10ML IV SOLN
INTRAVENOUS | Status: AC
  Filled 2018-05-29: qty 10

## 2018-05-29 MED ORDER — ALVIMOPAN 12 MG PO CAPS
12.0000 mg | ORAL_CAPSULE | Freq: Two times a day (BID) | ORAL | Status: DC
  Administered 2018-05-30: 12 mg via ORAL
  Filled 2018-05-29: qty 1

## 2018-05-29 MED ORDER — PRAVASTATIN SODIUM 20 MG PO TABS
20.0000 mg | ORAL_TABLET | Freq: Every evening | ORAL | Status: DC
  Administered 2018-05-29 – 2018-06-01 (×4): 20 mg via ORAL
  Filled 2018-05-29 (×4): qty 1

## 2018-05-29 MED ORDER — ROCURONIUM BROMIDE 50 MG/5ML IV SOSY
PREFILLED_SYRINGE | INTRAVENOUS | Status: DC | PRN
  Administered 2018-05-29: 50 mg via INTRAVENOUS
  Administered 2018-05-29: 20 mg via INTRAVENOUS
  Administered 2018-05-29: 10 mg via INTRAVENOUS

## 2018-05-29 MED ORDER — KETAMINE HCL 10 MG/ML IJ SOLN
INTRAMUSCULAR | Status: AC
  Filled 2018-05-29: qty 1

## 2018-05-29 MED ORDER — LISINOPRIL 20 MG PO TABS
20.0000 mg | ORAL_TABLET | Freq: Every day | ORAL | Status: DC
  Filled 2018-05-29: qty 1

## 2018-05-29 MED ORDER — FLUTICASONE FUROATE-VILANTEROL 200-25 MCG/INH IN AEPB: 1.0000 | INHALATION_SPRAY | Freq: Every day | RESPIRATORY_TRACT | Status: DC

## 2018-05-29 MED ORDER — SUGAMMADEX SODIUM 200 MG/2ML IV SOLN
INTRAVENOUS | Status: AC
  Filled 2018-05-29: qty 2

## 2018-05-29 MED ORDER — KETAMINE HCL 10 MG/ML IJ SOLN
INTRAMUSCULAR | Status: DC | PRN
  Administered 2018-05-29: 30 mg via INTRAVENOUS

## 2018-05-29 MED ORDER — ONDANSETRON HCL 4 MG/2ML IJ SOLN: 4.0000 mg | Freq: Once | INTRAMUSCULAR | Status: DC | PRN

## 2018-05-29 MED ORDER — BUPIVACAINE LIPOSOME 1.3 % IJ SUSP
INTRAMUSCULAR | Status: DC | PRN
  Administered 2018-05-29: 20 mL

## 2018-05-29 MED ORDER — ROCURONIUM BROMIDE 10 MG/ML (PF) SYRINGE
PREFILLED_SYRINGE | INTRAVENOUS | Status: AC
  Filled 2018-05-29: qty 10

## 2018-05-29 MED ORDER — ACETAMINOPHEN 500 MG PO TABS
1000.0000 mg | ORAL_TABLET | Freq: Four times a day (QID) | ORAL | Status: DC
  Administered 2018-05-29 – 2018-06-02 (×15): 1000 mg via ORAL
  Filled 2018-05-29 (×15): qty 2

## 2018-05-29 MED ORDER — TRAMADOL HCL 50 MG PO TABS
50.0000 mg | ORAL_TABLET | Freq: Four times a day (QID) | ORAL | Status: DC | PRN
  Administered 2018-05-30 – 2018-06-01 (×2): 50 mg via ORAL
  Filled 2018-05-29 (×2): qty 1

## 2018-05-29 MED ORDER — KCL IN DEXTROSE-NACL 20-5-0.45 MEQ/L-%-% IV SOLN
INTRAVENOUS | Status: DC
  Administered 2018-05-29 – 2018-05-30 (×2): via INTRAVENOUS
  Filled 2018-05-29 (×2): qty 1000

## 2018-05-29 MED ORDER — ONDANSETRON HCL 4 MG/2ML IJ SOLN
INTRAMUSCULAR | Status: DC | PRN
  Administered 2018-05-29: 4 mg via INTRAVENOUS

## 2018-05-29 MED ORDER — ENSURE SURGERY PO LIQD
237.0000 mL | Freq: Two times a day (BID) | ORAL | Status: DC
  Administered 2018-05-30 – 2018-06-01 (×6): 237 mL via ORAL
  Filled 2018-05-29 (×9): qty 237

## 2018-05-29 MED ORDER — LIDOCAINE 2% (20 MG/ML) 5 ML SYRINGE
INTRAMUSCULAR | Status: AC
  Filled 2018-05-29: qty 5

## 2018-05-29 MED ORDER — LIDOCAINE 2% (20 MG/ML) 5 ML SYRINGE
INTRAMUSCULAR | Status: DC | PRN
  Administered 2018-05-29: 100 mg via INTRAVENOUS

## 2018-05-29 MED ORDER — SODIUM CHLORIDE 0.9 % IV SOLN
2.0000 g | INTRAVENOUS | Status: AC
  Administered 2018-05-29: 2 g via INTRAVENOUS
  Filled 2018-05-29: qty 2

## 2018-05-29 MED ORDER — FENTANYL CITRATE (PF) 250 MCG/5ML IJ SOLN
INTRAMUSCULAR | Status: DC | PRN
  Administered 2018-05-29: 100 ug via INTRAVENOUS
  Administered 2018-05-29 (×3): 50 ug via INTRAVENOUS

## 2018-05-29 MED ORDER — ACETAMINOPHEN 500 MG PO TABS: 500.0000 mg | ORAL_TABLET | Freq: Four times a day (QID) | ORAL | Status: DC | PRN

## 2018-05-29 MED ORDER — ONDANSETRON HCL 4 MG/2ML IJ SOLN
4.0000 mg | Freq: Four times a day (QID) | INTRAMUSCULAR | Status: DC | PRN
  Administered 2018-05-29: 4 mg via INTRAVENOUS
  Filled 2018-05-29: qty 2

## 2018-05-29 MED ORDER — 0.9 % SODIUM CHLORIDE (POUR BTL) OPTIME
TOPICAL | Status: DC | PRN
  Administered 2018-05-29: 2000 mL

## 2018-05-29 MED ORDER — METOPROLOL TARTRATE 5 MG/5ML IV SOLN
INTRAVENOUS | Status: AC
  Filled 2018-05-29: qty 5

## 2018-05-29 MED ORDER — HYDROMORPHONE HCL 1 MG/ML IJ SOLN: 0.5000 mg | INTRAMUSCULAR | Status: DC | PRN

## 2018-05-29 MED ORDER — GLIMEPIRIDE 1 MG PO TABS
1.0000 mg | ORAL_TABLET | Freq: Every day | ORAL | Status: DC
  Administered 2018-05-30 – 2018-06-02 (×3): 1 mg via ORAL
  Filled 2018-05-29 (×4): qty 1

## 2018-05-29 MED ORDER — GABAPENTIN 300 MG PO CAPS
300.0000 mg | ORAL_CAPSULE | Freq: Two times a day (BID) | ORAL | Status: DC
  Administered 2018-05-29 – 2018-06-02 (×8): 300 mg via ORAL
  Filled 2018-05-29 (×8): qty 1

## 2018-05-29 MED ORDER — BUPIVACAINE-EPINEPHRINE (PF) 0.25% -1:200000 IJ SOLN
INTRAMUSCULAR | Status: AC
  Filled 2018-05-29: qty 30

## 2018-05-29 MED ORDER — FENTANYL CITRATE (PF) 100 MCG/2ML IJ SOLN: 25.0000 ug | INTRAMUSCULAR | Status: DC | PRN

## 2018-05-29 MED ORDER — PHENYLEPHRINE HCL 10 MG/ML IJ SOLN
INTRAVENOUS | Status: DC | PRN
  Administered 2018-05-29: 25 ug/min via INTRAVENOUS

## 2018-05-29 MED ORDER — FENTANYL CITRATE (PF) 250 MCG/5ML IJ SOLN
INTRAMUSCULAR | Status: AC
  Filled 2018-05-29: qty 5

## 2018-05-29 MED ORDER — ONDANSETRON HCL 4 MG/2ML IJ SOLN
INTRAMUSCULAR | Status: AC
  Filled 2018-05-29: qty 2

## 2018-05-29 MED ORDER — ALUM & MAG HYDROXIDE-SIMETH 200-200-20 MG/5ML PO SUSP: 30.0000 mL | Freq: Four times a day (QID) | ORAL | Status: DC | PRN

## 2018-05-29 MED ORDER — BUPIVACAINE-EPINEPHRINE 0.25% -1:200000 IJ SOLN
INTRAMUSCULAR | Status: DC | PRN
  Administered 2018-05-29: 30 mL

## 2018-05-29 MED ORDER — METFORMIN HCL 500 MG PO TABS
500.0000 mg | ORAL_TABLET | Freq: Two times a day (BID) | ORAL | Status: DC
  Administered 2018-05-31 – 2018-06-02 (×4): 500 mg via ORAL
  Filled 2018-05-29 (×4): qty 1

## 2018-05-29 MED ORDER — PROPOFOL 10 MG/ML IV BOLUS
INTRAVENOUS | Status: AC
  Filled 2018-05-29: qty 20

## 2018-05-29 MED ORDER — ALVIMOPAN 12 MG PO CAPS
12.0000 mg | ORAL_CAPSULE | ORAL | Status: AC
  Administered 2018-05-29: 12 mg via ORAL
  Filled 2018-05-29: qty 1

## 2018-05-29 MED ORDER — SUCCINYLCHOLINE CHLORIDE 200 MG/10ML IV SOSY
PREFILLED_SYRINGE | INTRAVENOUS | Status: AC
  Filled 2018-05-29: qty 10

## 2018-05-29 MED ORDER — DOXAZOSIN MESYLATE 8 MG PO TABS
8.0000 mg | ORAL_TABLET | Freq: Every day | ORAL | Status: DC
  Administered 2018-05-29 – 2018-06-01 (×4): 8 mg via ORAL
  Filled 2018-05-29 (×2): qty 1
  Filled 2018-05-29 (×4): qty 4
  Filled 2018-05-29 (×2): qty 1

## 2018-05-29 MED ORDER — GABAPENTIN 300 MG PO CAPS
300.0000 mg | ORAL_CAPSULE | ORAL | Status: AC
  Administered 2018-05-29: 300 mg via ORAL
  Filled 2018-05-29: qty 1

## 2018-05-29 MED ORDER — PROPOFOL 10 MG/ML IV BOLUS
INTRAVENOUS | Status: DC | PRN
  Administered 2018-05-29: 150 mg via INTRAVENOUS

## 2018-05-29 MED ORDER — PHENYLEPHRINE 40 MCG/ML (10ML) SYRINGE FOR IV PUSH (FOR BLOOD PRESSURE SUPPORT)
PREFILLED_SYRINGE | INTRAVENOUS | Status: AC
  Filled 2018-05-29: qty 10

## 2018-05-29 MED ORDER — LACTATED RINGERS IV SOLN
INTRAVENOUS | Status: DC | PRN
  Administered 2018-05-29 (×2): via INTRAVENOUS

## 2018-05-29 MED ORDER — ACETAMINOPHEN 500 MG PO TABS
1000.0000 mg | ORAL_TABLET | ORAL | Status: AC
  Administered 2018-05-29: 1000 mg via ORAL
  Filled 2018-05-29: qty 2

## 2018-05-29 MED ORDER — SUGAMMADEX SODIUM 200 MG/2ML IV SOLN
INTRAVENOUS | Status: DC | PRN
  Administered 2018-05-29: 200 mg via INTRAVENOUS

## 2018-05-29 MED ORDER — PHENYLEPHRINE 40 MCG/ML (10ML) SYRINGE FOR IV PUSH (FOR BLOOD PRESSURE SUPPORT)
PREFILLED_SYRINGE | INTRAVENOUS | Status: DC | PRN
  Administered 2018-05-29 (×3): 40 ug via INTRAVENOUS
  Administered 2018-05-29: 80 ug via INTRAVENOUS

## 2018-05-29 MED ORDER — DILTIAZEM HCL ER COATED BEADS 120 MG PO CP24
120.0000 mg | ORAL_CAPSULE | Freq: Two times a day (BID) | ORAL | Status: DC
  Administered 2018-05-29 – 2018-06-02 (×7): 120 mg via ORAL
  Filled 2018-05-29 (×8): qty 1

## 2018-05-29 SURGICAL SUPPLY — 70 items
ADH SKN CLS APL DERMABOND .7 (GAUZE/BANDAGES/DRESSINGS) ×1
APPLIER CLIP 5 13 M/L LIGAMAX5 (MISCELLANEOUS)
APR CLP MED LRG 5 ANG JAW (MISCELLANEOUS)
BLADE HEX COATED 2.75 (ELECTRODE) ×3 IMPLANT
CABLE HIGH FREQUENCY MONO STRZ (ELECTRODE) ×3 IMPLANT
CELLS DAT CNTRL 66122 CELL SVR (MISCELLANEOUS) IMPLANT
CLIP APPLIE 5 13 M/L LIGAMAX5 (MISCELLANEOUS) IMPLANT
COVER MAYO STAND STRL (DRAPES) ×3 IMPLANT
COVER SURGICAL LIGHT HANDLE (MISCELLANEOUS) ×2 IMPLANT
COVER WAND RF STERILE (DRAPES) ×2 IMPLANT
DERMABOND ADVANCED (GAUZE/BANDAGES/DRESSINGS) ×2
DERMABOND ADVANCED .7 DNX12 (GAUZE/BANDAGES/DRESSINGS) ×1 IMPLANT
DRAIN CHANNEL 19F RND (DRAIN) IMPLANT
DRAPE LAPAROSCOPIC ABDOMINAL (DRAPES) ×3 IMPLANT
DRAPE UTILITY W/TAPE 26X15 (DRAPES) IMPLANT
DRSG TEGADERM 4X4.75 (GAUZE/BANDAGES/DRESSINGS) IMPLANT
ELECT REM PT RETURN 15FT ADLT (MISCELLANEOUS) ×3 IMPLANT
EVACUATOR SILICONE 100CC (DRAIN) IMPLANT
GAUZE 4X4 16PLY RFD (DISPOSABLE) IMPLANT
GAUZE SPONGE 4X4 12PLY STRL (GAUZE/BANDAGES/DRESSINGS) ×2 IMPLANT
GLOVE BIO SURGEON STRL SZ 6.5 (GLOVE) ×4 IMPLANT
GLOVE BIO SURGEONS STRL SZ 6.5 (GLOVE) ×2
GLOVE BIOGEL PI IND STRL 7.0 (GLOVE) ×1 IMPLANT
GLOVE BIOGEL PI IND STRL 7.5 (GLOVE) IMPLANT
GLOVE BIOGEL PI INDICATOR 7.0 (GLOVE) ×8
GLOVE BIOGEL PI INDICATOR 7.5 (GLOVE) ×4
GLOVE ECLIPSE 8.0 STRL XLNG CF (GLOVE) ×4 IMPLANT
GLOVE INDICATOR 8.0 STRL GRN (GLOVE) ×4 IMPLANT
GLOVE SURG SS PI 6.5 STRL IVOR (GLOVE) ×4 IMPLANT
GOWN STRL REUS W/ TWL LRG LVL3 (GOWN DISPOSABLE) IMPLANT
GOWN STRL REUS W/ TWL XL LVL3 (GOWN DISPOSABLE) IMPLANT
GOWN STRL REUS W/TWL 2XL LVL3 (GOWN DISPOSABLE) ×4 IMPLANT
GOWN STRL REUS W/TWL LRG LVL3 (GOWN DISPOSABLE) ×6
GOWN STRL REUS W/TWL XL LVL3 (GOWN DISPOSABLE) ×12
IRRIG SUCT STRYKERFLOW 2 WTIP (MISCELLANEOUS) ×3
IRRIGATION SUCT STRKRFLW 2 WTP (MISCELLANEOUS) ×1 IMPLANT
KIT SIGMOIDOSCOPE (SET/KITS/TRAYS/PACK) ×2 IMPLANT
NDL INSUFFLATION 14GA 120MM (NEEDLE) IMPLANT
NEEDLE INSUFFLATION 14GA 120MM (NEEDLE) ×3 IMPLANT
PACK COLON (CUSTOM PROCEDURE TRAY) ×3 IMPLANT
PAD TELFA 2X3 NADH STRL (GAUZE/BANDAGES/DRESSINGS) ×2 IMPLANT
RETRACTOR WND ALEXIS 18 MED (MISCELLANEOUS) IMPLANT
RTRCTR WOUND ALEXIS 18CM MED (MISCELLANEOUS)
SCISSORS LAP 5X35 DISP (ENDOMECHANICALS) ×3 IMPLANT
SEALER TISSUE G2 STRG ARTC 35C (ENDOMECHANICALS) ×2 IMPLANT
SLEEVE XCEL OPT CAN 5 100 (ENDOMECHANICALS) ×5 IMPLANT
STAPLER ECHELON POWER CIR 31 (STAPLE) ×2 IMPLANT
STAPLER VISISTAT 35W (STAPLE) IMPLANT
SUT ETHILON 2 0 PS N (SUTURE) IMPLANT
SUT NOVA NAB DX-16 0-1 5-0 T12 (SUTURE) ×5 IMPLANT
SUT PDS AB 1 CTX 36 (SUTURE) IMPLANT
SUT PDS AB 1 TP1 96 (SUTURE) IMPLANT
SUT PROLENE 2 0 KS (SUTURE) ×2 IMPLANT
SUT SILK 2 0 (SUTURE) ×3
SUT SILK 2 0 SH CR/8 (SUTURE) ×3 IMPLANT
SUT SILK 2-0 18XBRD TIE 12 (SUTURE) ×1 IMPLANT
SUT SILK 3 0 (SUTURE) ×3
SUT SILK 3 0 SH CR/8 (SUTURE) ×3 IMPLANT
SUT SILK 3-0 18XBRD TIE 12 (SUTURE) ×1 IMPLANT
SUT VIC AB 2-0 SH 18 (SUTURE) IMPLANT
SUT VIC AB 2-0 SH 27 (SUTURE) ×6
SUT VIC AB 2-0 SH 27X BRD (SUTURE) IMPLANT
SYS LAPSCP GELPORT 120MM (MISCELLANEOUS)
SYSTEM LAPSCP GELPORT 120MM (MISCELLANEOUS) IMPLANT
TOWEL OR 17X26 10 PK STRL BLUE (TOWEL DISPOSABLE) ×6 IMPLANT
TOWEL OR NON WOVEN STRL DISP B (DISPOSABLE) ×3 IMPLANT
TRAY FOLEY MTR SLVR 16FR STAT (SET/KITS/TRAYS/PACK) ×2 IMPLANT
TROCAR BLADELESS OPT 5 100 (ENDOMECHANICALS) ×3 IMPLANT
TROCAR XCEL BLUNT TIP 100MML (ENDOMECHANICALS) ×3 IMPLANT
TUBING INSUF HEATED (TUBING) ×3 IMPLANT

## 2018-05-29 NOTE — Progress Notes (Signed)
Pt offered cpap for use tonight, but he declined.  Pt stated he wears nasal pillows at home and "can't handle" any masks.  Pt stated he will have someone bring in his nasal pillows from home to use tomorrow night if needed.  Pt was advised that RT is available all night should he change his mind.

## 2018-05-29 NOTE — Anesthesia Procedure Notes (Signed)
Procedure Name: Intubation Date/Time: 05/29/2018 7:36 AM Performed by: Maxwell Caul, CRNA Pre-anesthesia Checklist: Patient identified, Emergency Drugs available, Suction available and Patient being monitored Patient Re-evaluated:Patient Re-evaluated prior to induction Oxygen Delivery Method: Circle system utilized Preoxygenation: Pre-oxygenation with 100% oxygen Induction Type: IV induction Ventilation: Mask ventilation without difficulty Laryngoscope Size: Mac and 4 Grade View: Grade II Tube type: Oral Tube size: 7.5 mm Number of attempts: 1 Airway Equipment and Method: Stylet and Oral airway Placement Confirmation: ETT inserted through vocal cords under direct vision,  positive ETCO2 and breath sounds checked- equal and bilateral Secured at: 26 cm Tube secured with: Tape Dental Injury: Teeth and Oropharynx as per pre-operative assessment

## 2018-05-29 NOTE — H&P (Signed)
The patient is a 72 year old male presenting for a post-operative visit. 72 year old male who underwent a urgent Hartman's procedure for colon obstruction due to diverticular disease. He was unable to get a colonoscopy due to his stricture. His postop recovery was complicated by a bleeding gastric ulcer and then a postop small bowel obstruction which resolved with hospitalization and NG decompression. He is now back on his anticoagulation and tolerating a diet. He is having regular bowel function. He has no complaints. His colonoscopy showed no further pathology.    Problem List/Past Medical Leighton Ruff, MD; 12/27/3708 4:54 PM) SUBCUTANEOUS MASS OF BACK (R22.2) ENCOUNTER FOR REMOVAL OF STAPLES (Z48.02) STATUS POST HARTMANN PROCEDURE (Z93.3)  Past Surgical History Leighton Ruff, MD; 01/16/9484 4:54 PM) Cataract Surgery Left. Colon Polyp Removal - Colonoscopy Colon Polyp Removal - Open Vasectomy  Allergies (Tanisha A. Owens Shark, [place]; 04/18/2018 4:16 PM) Sulfa Antibiotics Allergies Reconciled  Medication History Leighton Ruff, MD; 4/62/7035 4:54 PM) DilTIAZem HCl ER Coated Beads (120MG Capsule ER 24HR, Oral) Active. Doxazosin Mesylate (8MG Tablet, Oral) Active. Eliquis (5MG Tablet, Oral) Active. Glimepiride (1MG Tablet, Oral) Active. Lisinopril (20MG Tablet, Oral) Active. MetFORMIN HCl (500MG Tablet, Oral) Active. Pravastatin Sodium (20MG Tablet, Oral) Active. Medications Reconciled Neomycin Sulfate (500MG Tablet, 2 (two) Oral SEE NOTE, Taken starting 04/18/2018) Active. (TAKE TWO TABLETS AT 2 PM, 3 PM, AND 10 PM THE DAY PRIOR TO SURGERY) Flagyl (500MG Tablet, 2 (two) Oral SEE NOTE, Taken starting 04/18/2018) Active. (Take at 2pm, 3pm, and 10pm the day prior to your colon operation)  Social History Leighton Ruff, MD; 0/03/3817 4:54 PM) Alcohol use Occasional alcohol use. Caffeine use Coffee. No drug use  Family History Leighton Ruff, MD; 2/99/3716  4:54 PM) Arthritis Father. Diabetes Mellitus Brother. Hypertension Brother. Kidney Disease Father. Malignant Neoplasm Of Pancreas Brother.  Other Problems Leighton Ruff, MD; 9/67/8938 4:54 PM) Arthritis Back Pain Diabetes Mellitus Enlarged Prostate High blood pressure Hypercholesterolemia Sleep Apnea     Review of Systems  HEENT Present- Seasonal Allergies and Wears glasses/contact lenses. Not Present- Earache, Hearing Loss, Hoarseness, Nose Bleed, Oral Ulcers, Ringing in the Ears, Sinus Pain, Sore Throat, Visual Disturbances and Yellow Eyes. Respiratory Present- Snoring. Not Present- Bloody sputum, Chronic Cough, Difficulty Breathing and Wheezing. Gastrointestinal Present- Excessive gas. Not Present- Abdominal Pain, Bloating, Bloody Stool, Change in Bowel Habits, Chronic diarrhea, Constipation, Difficulty Swallowing, Gets full quickly at meals, Hemorrhoids, Indigestion, Nausea, Rectal Pain and Vomiting. Male Genitourinary Present- Nocturia and Urgency. Not Present- Blood in Urine, Change in Urinary Stream, Frequency, Impotence, Painful Urination and Urine Leakage.  BP 137/88   Pulse (!) 101   Temp 98.2 F (36.8 C) (Oral)   Resp 18   Ht _0  (1.88 m)   Wt 97.6 kg   SpO2 97%   BMI 27.64 kg/m     Physical Exam  General Mental Status-Alert. General Appearance-Not in acute distress. Build & Nutrition-Well nourished. Posture-Normal posture. Gait-Normal.  Head and Neck Head-normocephalic, atraumatic with no lesions or palpable masses. Trachea-midline.  Chest and Lung Exam Chest and lung exam reveals -on auscultation, normal breath sounds, no adventitious sounds and normal vocal resonance.  Cardiovascular Cardiovascular examination reveals -normal heart sounds, regular rate and rhythm with no murmurs and no digital clubbing, cyanosis, edema, increased warmth or tenderness.  Abdomen Inspection Inspection of the abdomen  reveals - No Hernias. Palpation/Percussion Palpation and Percussion of the abdomen reveal - Soft, Non Tender, No Rigidity (guarding), No hepatosplenomegaly and No Palpable abdominal masses.  Neurologic Neurologic evaluation reveals -alert  and oriented x 3 with no impairment of recent or remote memory, normal attention span and ability to concentrate, normal sensation and normal coordination.  Musculoskeletal Normal Exam - Bilateral-Upper Extremity Strength Normal and Lower Extremity Strength Normal.    Assessment & Plan   STATUS POST HARTMANN PROCEDURE (Z93.3) Impression: 72 year old male who presents to the office for evaluation for colostomy reversal. He underwent surgery in May 2019 for an obstructed sigmoid colon mass. This turned out to be a diverticular stricture. He was unable to be reconnected at that time and he was given a left upper quadrant colostomy. He was readmitted for a gastric ulcer with bleeding as well as a early postop small bowel obstruction. Both of these have now resolved and he is back on his anticoagulation. He underwent a colonoscopy which showed no other pathologic findings except for diverticulosis. He is ready for colostomy reversal. We will touch base with his cardiologist regarding his Evans Lance and get him scheduled for surgery most likely in late October or early November. The surgery and anatomy were described to the patient as well as the risks of surgery and the possible complications. These include: Bleeding, deep abdominal infections and possible wound complications such as hernia and infection, damage to adjacent structures, leak of surgical connections, which can lead to other surgeries and possibly an ostomy, possible need for other procedures, such as abscess drains in radiology, possible prolonged hospital stay, possible diarrhea from removal of part of the colon, possible constipation from narcotics, possible bowel, bladder or sexual  dysfunction if having rectal surgery, prolonged fatigue/weakness or appetite loss, possible early recurrence of of disease, possible complications of their medical problems such as heart disease or arrhythmias or lung problems, death (less than 1%). I believe the patient understands and wishes to proceed with the surgery.

## 2018-05-29 NOTE — Anesthesia Preprocedure Evaluation (Addendum)
Anesthesia Evaluation  Patient identified by MRN, date of birth, ID band Patient awake    Reviewed: Allergy & Precautions, NPO status , Patient's Chart, lab work & pertinent test results  Airway Mallampati: II  TM Distance: >3 FB Neck ROM: Full    Dental  (+) Dental Advisory Given, Partial Upper, Partial Lower   Pulmonary sleep apnea and Continuous Positive Airway Pressure Ventilation , former smoker,    Pulmonary exam normal breath sounds clear to auscultation       Cardiovascular hypertension, Pt. on medications + Peripheral Vascular Disease  + dysrhythmias Atrial Fibrillation  Rhythm:Irregular Rate:Tachycardia     Neuro/Psych negative neurological ROS     GI/Hepatic negative GI ROS, Neg liver ROS, S/p colostomy    Endo/Other  diabetes, Type 2, Oral Hypoglycemic Agents  Renal/GU Renal InsufficiencyRenal disease     Musculoskeletal negative musculoskeletal ROS (+)   Abdominal   Peds  Hematology  (+) Blood dyscrasia (Eliquis), anemia ,   Anesthesia Other Findings Day of surgery medications reviewed with the patient.  Reproductive/Obstetrics                           Anesthesia Physical Anesthesia Plan  ASA: III  Anesthesia Plan: General   Post-op Pain Management:    Induction: Intravenous  PONV Risk Score and Plan: 4 or greater and Dexamethasone, Ondansetron and Treatment may vary due to age or medical condition  Airway Management Planned: Oral ETT  Additional Equipment:   Intra-op Plan:   Post-operative Plan: Extubation in OR  Informed Consent: I have reviewed the patients History and Physical, chart, labs and discussed the procedure including the risks, benefits and alternatives for the proposed anesthesia with the patient or authorized representative who has indicated his/her understanding and acceptance.   Dental advisory given  Plan Discussed with: CRNA,  Anesthesiologist and Surgeon  Anesthesia Plan Comments:        Anesthesia Quick Evaluation

## 2018-05-29 NOTE — Transfer of Care (Signed)
Immediate Anesthesia Transfer of Care Note  Patient: Manuel [name]  Procedure(s) Performed: LAPAROSCOPIC COLOSTOMY REVERSAL ERAS PATHWAY (N/A )  Patient Location: PACU  Anesthesia Type:General  Level of Consciousness: oriented and drowsy  Airway & Oxygen Therapy: Patient Spontanous Breathing and Patient connected to face mask oxygen  Post-op Assessment: Report given to RN and Post -op Vital signs reviewed and stable  Post vital signs: Reviewed and stable  Last Vitals:  Vitals Value Taken Time  BP 146/84 05/29/2018 10:15 AM  Temp    Pulse 84 05/29/2018 10:16 AM  Resp 15 05/29/2018 10:16 AM  SpO2 94 % 05/29/2018 10:16 AM  Vitals shown include unvalidated device data.  Last Pain:  Vitals:   05/29/18 0555  TempSrc:   PainSc: 0-No pain      Patients Stated Pain Goal: 4 (53/64/68 0321)  Complications: No apparent anesthesia complications

## 2018-05-29 NOTE — Op Note (Signed)
05/29/2018  9:59 AM  PATIENT:  Manuel [name]  72 y.o. male  Patient Care Team: Jani Gravel, MD as PCP - General (Internal Medicine) Adrian Prows, MD as Consulting Physician (Cardiology) Michael Boston, MD as Consulting Physician (General Surgery) Jackelyn Knife, MD as Rounding Team (Internal Medicine)  PRE-OPERATIVE DIAGNOSIS:  Colostomy  POST-OPERATIVE DIAGNOSIS:  Colostomy reversal  PROCEDURE:   LAPAROSCOPIC COLOSTOMY REVERSAL   Surgeon(s): Leighton Ruff, MD Michael Boston, MD  ASSISTANT: Dr Johney Maine   ANESTHESIA:   local and general  EBL: 86m  Total I/O In: 1000 [I.V.:1000] Out: 310 [Urine:260; Blood:50]  Delay start of Pharmacological VTE agent (>24hrs) due to surgical blood loss or risk of bleeding:  no  DRAINS: none   SPECIMEN:  Source of Specimen:  colostomy  DISPOSITION OF SPECIMEN:  PATHOLOGY  COUNTS:  YES  PLAN OF CARE: Admit to inpatient   PATIENT DISPOSITION:  PACU - hemodynamically stable.  INDICATION:    72y.o. [name]/p urgent colectomy and colostomy for obstructing colon cancer.  He is now ready for colostomy reversal.  The anatomy & physiology of the digestive tract was discussed.  The pathophysiology was discussed.  Natural history risks without surgery was discussed.   I worked to give an overview of the disease and the frequent need to have multispecialty involvement.  I feel the risks of no intervention will lead to serious problems that outweigh the operative risks; therefore, I recommended a partial colectomy to remove the pathology.  Laparoscopic & open techniques were discussed.   Risks such as bleeding, infection, abscess, leak, reoperation, possible ostomy, hernia, heart attack, death, and other risks were discussed.  I noted a good likelihood this will help address the problem.   Goals of post-operative recovery were discussed as well.    The patient expressed understanding & wished to proceed with surgery.  OR FINDINGS:   Patient had  significant, thin small bowel adhesions  No obvious metastatic disease on visceral parietal peritoneum or liver.  The anastomosis rests ~15 cm from the anal verge by rigid proctoscopy.  DESCRIPTION:   Informed consent was confirmed.  The patient underwent general anaesthesia without difficulty.  The patient was positioned appropriately.  VTE prevention in place.  The patient's abdomen was clipped, prepped, & draped in a sterile fashion.  Surgical timeout confirmed our plan.  The patient was positioned in reverse Trendelenburg.  Abdominal entry was gained using optiview technique in the RUQ after Varies needle attempt failed in the LUQ and RUQ.  Entry was clean.  I induced carbon dioxide insufflation.  Camera inspection revealed no injury at either site.  Extra ports were carefully placed under direct laparoscopic visualization.   I reflected the greater omentum and the upper abdomen the small bowel in the upper abdomen.  Small bowel to the ostomy site as well as to the left lower quadrant abdominal wall.  These were taken down using sharp dissection.  Once these were cleared from a colostomy site was able to mobilize the small bowel into the pelvis and identify the rectal stump.  There was a loop of small bowel adherent to the staple line which was carefully dissected free.  I scored the base of peritoneum of the right side of the mesentery of the left colon. The patient had an intact superior hemorrhoidal artery and vein.  I elevated the sigmoid mesentery and enetered into the retro-mesenteric plane. We were able to identify the left ureter and gonadal vessels. We kept those posterior within  the retroperitoneum and elevated the left colon mesentery off that. I continued distally and got into the avascular plane posterior to the mesorectum. This allowed me to help mobilize the rectum as well by freeing the mesorectum off the sacrum.  I mobilized the peritoneal coverings towards the peritoneal reflection  on both the right and left sides of the rectum.  We placed a blunt instrument into the rectum and confirmed patency to the staple line.    I then took down the ostomy using Bovie electrocautery.  The extraperitoneal portion of the ostomy was transected over a pursestring device and a 2-0 Prolene pursestring was placed.  A 31 mm EEA stapler anvil was then placed into the end of the colon and the pursestring was tied tightly around this.  This was then placed back into the abdomen and the abdomen was reinsufflated.  The EEA stapler was introduced into the rectum and brought out through the end through the anterior wall.  An anastomosis was created without difficulty.  There was no tension.  There was no leak when tested with insufflation.  Staple line sits approximately 15 cm from the anal verge.  The abdomen was irrigated with 2 L of warm normal saline.  Hemostasis was good.  The omentum was placed down over the abdominal contents and the abdomen was desufflated.  All ports were removed.  I then switched to clean gowns, gloves, instruments and drapes.  The fascia of the ostomy site was then closed using interrupted #1 Novafil sutures.  The subcutaneous tissue was brought together over top of this using a 2-0 Vicryl pursestring suture.  The dermal layer was then partially closed using a 2-0 Prolene pursestring suture.  A Telfa wick was placed in the middle and a sterile dressing was placed over this.  The remaining 5 mm port sites were then closed using a 4-0 subcuticular suture and Dermabond.  Patient tolerated this well was sent to the postanesthesia care unit in stable condition.  All counts were correct per operating room staff.  An MD assistant was necessary for tissue manipulation, retraction and positioning due to the complexity of the case and hospital policies.

## 2018-05-30 ENCOUNTER — Encounter (HOSPITAL_COMMUNITY): Payer: Self-pay | Admitting: General Surgery

## 2018-05-30 LAB — GLUCOSE, CAPILLARY
GLUCOSE-CAPILLARY: 92 mg/dL (ref 70–99)
GLUCOSE-CAPILLARY: 96 mg/dL (ref 70–99)
Glucose-Capillary: 144 mg/dL — ABNORMAL HIGH (ref 70–99)
Glucose-Capillary: 165 mg/dL — ABNORMAL HIGH (ref 70–99)

## 2018-05-30 LAB — CBC
HEMATOCRIT: 42.1 % (ref 39.0–52.0)
Hemoglobin: 11.7 g/dL — ABNORMAL LOW (ref 13.0–17.0)
MCH: 25 pg — ABNORMAL LOW (ref 26.0–34.0)
MCHC: 27.8 g/dL — ABNORMAL LOW (ref 30.0–36.0)
MCV: 90 fL (ref 80.0–100.0)
NRBC: 0.3 % — AB (ref 0.0–0.2)
Platelets: 222 10*3/uL (ref 150–400)
RBC: 4.68 MIL/uL (ref 4.22–5.81)
RDW: 20.4 % — AB (ref 11.5–15.5)
WBC: 7.3 10*3/uL (ref 4.0–10.5)

## 2018-05-30 LAB — BASIC METABOLIC PANEL
ANION GAP: 5 (ref 5–15)
BUN: 11 mg/dL (ref 8–23)
CALCIUM: 8.4 mg/dL — AB (ref 8.9–10.3)
CO2: 31 mmol/L (ref 22–32)
Chloride: 104 mmol/L (ref 98–111)
Creatinine, Ser: 1.22 mg/dL (ref 0.61–1.24)
GFR calc Af Amer: >60 mL/min (ref >60)
GFR calc non Af Amer: 57 mL/min — ABNORMAL LOW (ref >60)
GLUCOSE: 162 mg/dL — AB (ref 70–99)
POTASSIUM: 5.3 mmol/L — AB (ref 3.5–5.1)
Sodium: 140 mmol/L (ref 135–145)

## 2018-05-30 NOTE — Progress Notes (Signed)
Pt has declined use of hospital provided CPAP.  RT to monitor and assess as needed.

## 2018-05-30 NOTE — Progress Notes (Signed)
1 Day Post-Op colostomy reversal Subjective: No flatus or BM, tolerating liquids.  No nausea.  Pain controlled  Objective: Vital signs in last 24 hours: Temp:  [97.6 F (36.4 C)-98.3 F (36.8 C)] 97.6 F (36.4 C) (11/08 0552) Pulse Rate:  [56-88] 59 (11/08 1025) Resp:  [18-19] 18 (11/08 0900) BP: (92-138)/(59-83) 112/65 (11/08 1025) SpO2:  [91 %-98 %] 91 % (11/08 0900) Weight:  [98.1 kg] 98.1 kg (11/08 0500)   Intake/Output from previous day: 11/07 0701 - 11/08 0700 In: 3782.1 [P.O.:480; I.V.:3302.1] Out: 2195 [Urine:1945; Emesis/NG output:200; Blood:50] Intake/Output this shift: Total I/O In: 660.1 [P.O.:360; I.V.:300.1] Out: 200 [Urine:200]   General appearance: alert and cooperative GI: abnormal findings:  distended  Incision: no significant drainage  Lab Results:  Recent Labs    05/30/18 0510  WBC 7.3  HGB 11.7*  HCT 42.1  PLT 222   BMET Recent Labs    05/30/18 0510  NA 140  K 5.3*  CL 104  CO2 31  GLUCOSE 162*  BUN 11  CREATININE 1.22  CALCIUM 8.4*   PT/INR No results for input(s): LABPROT, INR in the last 72 hours. ABG No results for input(s): PHART, HCO3 in the last 72 hours.  Invalid input(s): PCO2, PO2  MEDS, Scheduled . acetaminophen  1,000 mg Oral Q6H  . alvimopan  12 mg Oral BID  . diltiazem  120 mg Oral BID  . doxazosin  8 mg Oral QHS  . enoxaparin (LOVENOX) injection  40 mg Subcutaneous Q24H  . feeding supplement  237 mL Oral BID BM  . gabapentin  300 mg Oral BID  . glimepiride  1 mg Oral Q breakfast  . [START ON 05/31/2018] metFORMIN  500 mg Oral BID WC  . pravastatin  20 mg Oral QPM  . saccharomyces boulardii  250 mg Oral BID    Studies/Results: No results found.  Assessment: s/p Procedure(s): LAPAROSCOPIC COLOSTOMY REVERSAL ERAS PATHWAY, RIGID PROCTOSCOPY Patient Active Problem List   Diagnosis Date Noted  . Colon cancer ([place]) 05/29/2018  . Small bowel obstruction ([place]) 01/02/2018  . Gastric ulcer without hemorrhage  or perforation 12/20/2017  . Duodenal ulcer 12/20/2017  . Melena 12/20/2017  . Vitamin B12 deficiency 12/20/2017  . Acute posthemorrhagic anemia 12/19/2017  . Acute GI bleeding 12/19/2017  . Esophageal candidiasis ([place]) 12/19/2017  . Hypoxia   . Low oxygen saturation   . Colonic stricture ([place]) 12/13/2017  . Atrial fibrillation ([place]) 12/12/2017  . BPH (benign prostatic hyperplasia) 12/12/2017  . DM (diabetes mellitus) ([place]) 12/12/2017  . HTN (hypertension) 12/12/2017  . OSA (obstructive sleep apnea) 12/12/2017  . Constipation   . Abdominal distension   . Left groin pain 04/10/2011    Expected post op course  Plan: d/c foley  Cont liquid diet as tolerated Ambulate BP meds held due to mild hypotension   LOS: 1 day     .Rosario Adie, MD [facility], [place]   05/30/2018 1:02 PM

## 2018-05-30 NOTE — Progress Notes (Signed)
Pt blood pressure 92/59 pulse 56 cardizem and lisinopril held. MD notified will continue to monitor.

## 2018-05-30 NOTE — Anesthesia Postprocedure Evaluation (Signed)
Anesthesia Post Note  Patient: Manuel [name]  Procedure(s) Performed: LAPAROSCOPIC COLOSTOMY REVERSAL ERAS PATHWAY, RIGID PROCTOSCOPY (N/A )     Patient location during evaluation: PACU Anesthesia Type: General Level of consciousness: awake and alert, awake and oriented Pain management: pain level controlled Vital Signs Assessment: post-procedure vital signs reviewed and stable Respiratory status: spontaneous breathing, nonlabored ventilation and respiratory function stable Cardiovascular status: blood pressure returned to baseline and stable Postop Assessment: no apparent nausea or vomiting Anesthetic complications: no    Last Vitals:  Vitals:   05/30/18 1354 05/30/18 2139  BP: 103/68 129/81  Pulse: 61 85  Resp: 16 16  Temp: 36.9 C 36.8 C  SpO2: 91% 93%    Last Pain:  Vitals:   05/30/18 2139  TempSrc: Oral  PainSc:                  Catalina Gravel

## 2018-05-31 LAB — CBC
HCT: 40.1 % (ref 39.0–52.0)
Hemoglobin: 11.4 g/dL — ABNORMAL LOW (ref 13.0–17.0)
MCH: 24.7 pg — ABNORMAL LOW (ref 26.0–34.0)
MCHC: 28.4 g/dL — ABNORMAL LOW (ref 30.0–36.0)
MCV: 87 fL (ref 80.0–100.0)
NRBC: 0 % (ref 0.0–0.2)
PLATELETS: 209 10*3/uL (ref 150–400)
RBC: 4.61 MIL/uL (ref 4.22–5.81)
RDW: 20.6 % — ABNORMAL HIGH (ref 11.5–15.5)
WBC: 5.1 10*3/uL (ref 4.0–10.5)

## 2018-05-31 LAB — BASIC METABOLIC PANEL
ANION GAP: 4 — AB (ref 5–15)
BUN: 13 mg/dL (ref 8–23)
CHLORIDE: 105 mmol/L (ref 98–111)
CO2: 31 mmol/L (ref 22–32)
Calcium: 8.4 mg/dL — ABNORMAL LOW (ref 8.9–10.3)
Creatinine, Ser: 1.05 mg/dL (ref 0.61–1.24)
GFR calc non Af Amer: >60 mL/min (ref >60)
Glucose, Bld: 82 mg/dL (ref 70–99)
POTASSIUM: 4.2 mmol/L (ref 3.5–5.1)
Sodium: 140 mmol/L (ref 135–145)

## 2018-05-31 LAB — GLUCOSE, CAPILLARY
GLUCOSE-CAPILLARY: 68 mg/dL — AB (ref 70–99)
GLUCOSE-CAPILLARY: 82 mg/dL (ref 70–99)
GLUCOSE-CAPILLARY: 141 mg/dL — AB (ref 70–99)
GLUCOSE-CAPILLARY: 104 mg/dL — AB (ref 70–99)
GLUCOSE-CAPILLARY: 81 mg/dL (ref 70–99)

## 2018-05-31 MED ORDER — LISINOPRIL 20 MG PO TABS
20.0000 mg | ORAL_TABLET | Freq: Every day | ORAL | Status: DC
  Administered 2018-06-01 – 2018-06-02 (×2): 20 mg via ORAL
  Filled 2018-05-31 (×2): qty 1

## 2018-05-31 NOTE — Progress Notes (Signed)
Pt has declined use of Hospital provided CPAP.  RT to monitor and assess as needed.

## 2018-05-31 NOTE — Progress Notes (Signed)
Pt blood sugar 68 held metformin and glimepiride. MD notified. Will continue to monitor.

## 2018-05-31 NOTE — Progress Notes (Signed)
2 Days Post-Op colostomy reversal Subjective: No flatus or BM per pt, tolerating liquids.  No nausea.  Pain controlled  Objective: Vital signs in last 24 hours: Temp:  [97.8 F (36.6 C)-98.5 F (36.9 C)] 97.8 F (36.6 C) (11/09 0546) Pulse Rate:  [56-85] 81 (11/09 0546) Resp:  [16-18] 16 (11/09 0546) BP: (92-134)/(59-87) 134/87 (11/09 0546) SpO2:  [91 %-95 %] 95 % (11/09 0546) Weight:  [99.8 kg] 99.8 kg (11/09 0500)   Intake/Output from previous day: 11/08 0701 - 11/09 0700 In: 1467.3 [P.O.:960; I.V.:507.3] Out: 2775 [Urine:2775] Intake/Output this shift: Total I/O In: 120 [P.O.:120] Out: -    General appearance: alert and cooperative GI: abnormal findings:  distended  Incision: no significant drainage  Lab Results:  Recent Labs    05/30/18 0510 05/31/18 0343  WBC 7.3 5.1  HGB 11.7* 11.4*  HCT 42.1 40.1  PLT 222 209   BMET Recent Labs    05/30/18 0510 05/31/18 0343  NA 140 140  K 5.3* 4.2  CL 104 105  CO2 31 31  GLUCOSE 162* 82  BUN 11 13  CREATININE 1.22 1.05  CALCIUM 8.4* 8.4*   PT/INR No results for input(s): LABPROT, INR in the last 72 hours. ABG No results for input(s): PHART, HCO3 in the last 72 hours.  Invalid input(s): PCO2, PO2  MEDS, Scheduled . acetaminophen  1,000 mg Oral Q6H  . diltiazem  120 mg Oral BID  . doxazosin  8 mg Oral QHS  . enoxaparin (LOVENOX) injection  40 mg Subcutaneous Q24H  . feeding supplement  237 mL Oral BID BM  . gabapentin  300 mg Oral BID  . glimepiride  1 mg Oral Q breakfast  . metFORMIN  500 mg Oral BID WC  . pravastatin  20 mg Oral QPM  . saccharomyces boulardii  250 mg Oral BID    Studies/Results: No results found.  Assessment: s/p Procedure(s): LAPAROSCOPIC COLOSTOMY REVERSAL ERAS PATHWAY, RIGID PROCTOSCOPY Patient Active Problem List   Diagnosis Date Noted  . Colon cancer ([place]) 05/29/2018  . Small bowel obstruction ([place]) 01/02/2018  . Gastric ulcer without hemorrhage or perforation  12/20/2017  . Duodenal ulcer 12/20/2017  . Melena 12/20/2017  . Vitamin B12 deficiency 12/20/2017  . Acute posthemorrhagic anemia 12/19/2017  . Acute GI bleeding 12/19/2017  . Esophageal candidiasis ([place]) 12/19/2017  . Hypoxia   . Low oxygen saturation   . Colonic stricture ([place]) 12/13/2017  . Atrial fibrillation ([place]) 12/12/2017  . BPH (benign prostatic hyperplasia) 12/12/2017  . DM (diabetes mellitus) ([place]) 12/12/2017  . HTN (hypertension) 12/12/2017  . OSA (obstructive sleep apnea) 12/12/2017  . Constipation   . Abdominal distension   . Left groin pain 04/10/2011    Expected post op course  Plan: d/c foley  Cont liquid diet as tolerated Ambulate BP meds held due to mild hypotension, will restart tomorrow   LOS: 2 days     .Rosario Adie, MD [facility], [place]   05/31/2018 8:13 AM

## 2018-06-01 LAB — CBC
HCT: 38.2 % — ABNORMAL LOW (ref 39.0–52.0)
Hemoglobin: 11.1 g/dL — ABNORMAL LOW (ref 13.0–17.0)
MCH: 25.4 pg — ABNORMAL LOW (ref 26.0–34.0)
MCHC: 29.1 g/dL — AB (ref 30.0–36.0)
MCV: 87.4 fL (ref 80.0–100.0)
NRBC: 0 % (ref 0.0–0.2)
PLATELETS: 193 10*3/uL (ref 150–400)
RBC: 4.37 MIL/uL (ref 4.22–5.81)
RDW: 20.8 % — AB (ref 11.5–15.5)
WBC: 5.5 10*3/uL (ref 4.0–10.5)

## 2018-06-01 LAB — BASIC METABOLIC PANEL
ANION GAP: 6 (ref 5–15)
BUN: 11 mg/dL (ref 8–23)
CALCIUM: 8.3 mg/dL — AB (ref 8.9–10.3)
CO2: 30 mmol/L (ref 22–32)
CREATININE: 0.99 mg/dL (ref 0.61–1.24)
Chloride: 103 mmol/L (ref 98–111)
Glucose, Bld: 108 mg/dL — ABNORMAL HIGH (ref 70–99)
Potassium: 4.1 mmol/L (ref 3.5–5.1)
Sodium: 139 mmol/L (ref 135–145)

## 2018-06-01 LAB — GLUCOSE, CAPILLARY
GLUCOSE-CAPILLARY: 105 mg/dL — AB (ref 70–99)
GLUCOSE-CAPILLARY: 100 mg/dL — AB (ref 70–99)
Glucose-Capillary: 96 mg/dL (ref 70–99)

## 2018-06-01 MED ORDER — APIXABAN 5 MG PO TABS
5.0000 mg | ORAL_TABLET | Freq: Two times a day (BID) | ORAL | Status: DC
  Administered 2018-06-01 – 2018-06-02 (×3): 5 mg via ORAL
  Filled 2018-06-01 (×3): qty 1

## 2018-06-01 NOTE — Progress Notes (Signed)
I have reviewed and concur with this student's documentation.

## 2018-06-01 NOTE — Progress Notes (Signed)
Patient does not want to wear hospital CPAP. Patient states he may be discharged in the am. RT told patient if he changes his mind to let RN know and that I will bring him a machine up.

## 2018-06-01 NOTE — Progress Notes (Signed)
3 Days Post-Op colostomy reversal Subjective: Having flatus and BM's, tolerating liquids.  No nausea.  Pain controlled  Objective: Vital signs in last 24 hours: Temp:  [98 F (36.7 C)-98.8 F (37.1 C)] 98.8 F (37.1 C) (11/10 0506) Pulse Rate:  [57-114] 76 (11/10 0506) Resp:  [13-16] 16 (11/10 0506) BP: (120-133)/(74-80) 133/80 (11/10 0506) SpO2:  [90 %-94 %] 90 % (11/10 0506) Weight:  [99.8 kg] 99.8 kg (11/10 0506)   Intake/Output from previous day: 11/09 0701 - 11/10 0700 In: 1020 [P.O.:1020] Out: 3900 [Urine:3900] Intake/Output this shift: No intake/output data recorded.   General appearance: alert and cooperative GI: abnormal findings:  distended  Incision: no significant drainage  Lab Results:  Recent Labs    05/31/18 0343 06/01/18 0407  WBC 5.1 5.5  HGB 11.4* 11.1*  HCT 40.1 38.2*  PLT 209 193   BMET Recent Labs    05/31/18 0343 06/01/18 0407  NA 140 139  K 4.2 4.1  CL 105 103  CO2 31 30  GLUCOSE 82 108*  BUN 13 11  CREATININE 1.05 0.99  CALCIUM 8.4* 8.3*   PT/INR No results for input(s): LABPROT, INR in the last 72 hours. ABG No results for input(s): PHART, HCO3 in the last 72 hours.  Invalid input(s): PCO2, PO2  MEDS, Scheduled . acetaminophen  1,000 mg Oral Q6H  . diltiazem  120 mg Oral BID  . doxazosin  8 mg Oral QHS  . enoxaparin (LOVENOX) injection  40 mg Subcutaneous Q24H  . feeding supplement  237 mL Oral BID BM  . gabapentin  300 mg Oral BID  . glimepiride  1 mg Oral Q breakfast  . lisinopril  20 mg Oral Daily  . metFORMIN  500 mg Oral BID WC  . pravastatin  20 mg Oral QPM  . saccharomyces boulardii  250 mg Oral BID    Studies/Results: No results found.  Assessment: s/p Procedure(s): LAPAROSCOPIC COLOSTOMY REVERSAL ERAS PATHWAY, RIGID PROCTOSCOPY Patient Active Problem List   Diagnosis Date Noted  . Colon cancer ([place]) 05/29/2018  . Small bowel obstruction ([place]) 01/02/2018  . Gastric ulcer without hemorrhage or  perforation 12/20/2017  . Duodenal ulcer 12/20/2017  . Melena 12/20/2017  . Vitamin B12 deficiency 12/20/2017  . Acute posthemorrhagic anemia 12/19/2017  . Acute GI bleeding 12/19/2017  . Esophageal candidiasis ([place]) 12/19/2017  . Hypoxia   . Low oxygen saturation   . Colonic stricture ([place]) 12/13/2017  . Atrial fibrillation ([place]) 12/12/2017  . BPH (benign prostatic hyperplasia) 12/12/2017  . DM (diabetes mellitus) ([place]) 12/12/2017  . HTN (hypertension) 12/12/2017  . OSA (obstructive sleep apnea) 12/12/2017  . Constipation   . Abdominal distension   . Left groin pain 04/10/2011    Expected post op course  Plan: Advance diet as tolerated Ambulate Restart home meds   LOS: 3 days     .Rosario Adie, MD [facility], [place]   06/01/2018 8:10 AM

## 2018-06-02 LAB — CBC
HCT: 38.8 % — ABNORMAL LOW (ref 39.0–52.0)
Hemoglobin: 11.1 g/dL — ABNORMAL LOW (ref 13.0–17.0)
MCH: 24.9 pg — ABNORMAL LOW (ref 26.0–34.0)
MCHC: 28.6 g/dL — ABNORMAL LOW (ref 30.0–36.0)
MCV: 87 fL (ref 80.0–100.0)
NRBC: 0 % (ref 0.0–0.2)
PLATELETS: 222 10*3/uL (ref 150–400)
RBC: 4.46 MIL/uL (ref 4.22–5.81)
RDW: 20.7 % — ABNORMAL HIGH (ref 11.5–15.5)
WBC: 5.1 10*3/uL (ref 4.0–10.5)

## 2018-06-02 NOTE — Progress Notes (Signed)
Discharge instructions discussed with patient and family, verbalized agreement and understanding

## 2018-06-02 NOTE — Discharge Instructions (Signed)
ABDOMINAL SURGERY: POST OP INSTRUCTIONS  1. DIET: Follow a light bland diet the first 24 hours after arrival home, such as soup, liquids, crackers, etc.  Be sure to include lots of fluids daily.  Avoid fast food or heavy meals as your are more likely to get nauseated.  Do not eat any uncooked fruits or vegetables for the next 2 weeks as your colon heals. 2. Take your usually prescribed home medications unless otherwise directed. 3. PAIN CONTROL: a. Pain is best controlled by a usual combination of three different methods TOGETHER: i. Ice/Heat ii. Over the counter pain medication iii. Prescription pain medication b. Most patients will experience some swelling and bruising around the incisions.  Ice packs or heating pads (30-60 minutes up to 6 times a day) will help. Use ice for the first few days to help decrease swelling and bruising, then switch to heat to help relax tight/sore spots and speed recovery.  Some people prefer to use ice alone, heat alone, alternating between ice & heat.  Experiment to what works for you.  Swelling and bruising can take several weeks to resolve.   c. It is helpful to take an over-the-counter pain medication regularly for the first few weeks.  Choose one of the following that works best for you: i. Naproxen (Aleve, etc)  Two 243m tabs twice a day ii. Ibuprofen (Advil, etc) Three 2036mtabs four times a day (every meal & bedtime) iii. Acetaminophen (Tylenol, etc) 500-65045mour times a day (every meal & bedtime) d. A  prescription for pain medication (such as oxycodone, hydrocodone, etc) should be given to you upon discharge.  Take your pain medication as prescribed.  i. If you are having problems/concerns with the prescription medicine (does not control pain, nausea, vomiting, rash, itching, etc), please call us [phone] see if we need to switch you to a different pain medicine that will work better for you and/or control your side effect better. ii. If you  need a refill on your pain medication, please contact your pharmacy.  They will contact our office to request authorization. Prescriptions will not be filled after 5 pm or on week-ends. 4. Avoid getting constipated.  Between the surgery and the pain medications, it is common to experience some constipation.  Increasing fluid intake and taking a fiber supplement (such as Metamucil, Citrucel, FiberCon, MiraLax, etc) 1-2 times a day regularly will usually help prevent this problem from occurring.  A mild laxative (prune juice, Milk of Magnesia, MiraLax, etc) should be taken according to package directions if there are no bowel movements after 48 hours.   5. Watch out for diarrhea.  If you have many loose bowel movements, simplify your diet to bland foods & liquids for a few days.  Stop any stool softeners and decrease your fiber supplement.  Switching to mild anti-diarrheal medications (Kayopectate, Pepto Bismol) can help.  If this worsens or does not improve, please call us.[place]. Wash / shower every day.  You may shower over the incision / wound.  Avoid baths until the skin is fully healed.  Continue to shower over incision(s) after the dressing is off. 7. Remove your waterproof bandages 5 days after surgery.  You may leave the incision open to air.  You may replace a dressing/Band-Aid to cover the incision for comfort if you wish. 8. ACTIVITIES as tolerated:   a. You may resume regular (light) daily activities beginning the next day--such as daily self-care, walking, climbing stairs--gradually increasing activities as  tolerated.  If you can walk 30 minutes without difficulty, it is safe to try more intense activity such as jogging, treadmill, bicycling, low-impact aerobics, swimming, etc. b. Save the most intensive and strenuous activity for last such as sit-ups, heavy lifting, contact sports, etc  Refrain from any heavy lifting or straining until you are off narcotics for pain control.   c. DO NOT PUSH THROUGH  PAIN.  Let pain be your guide: If it hurts to do something, don't do it.  Pain is your body warning you to avoid that activity for another week until the pain goes down. d. You may drive when you are no longer taking prescription pain medication, you can comfortably wear a seatbelt, and you can safely maneuver your car and apply brakes. e. Dennis Bast may have sexual intercourse when it is comfortable.  9. FOLLOW UP in our office a. Please call [facility] at [phone] to set up an appointment to see your surgeon in the office for a follow-up appointment approximately 1-2 weeks after your surgery. b. Make sure that you call for this appointment the day you arrive home to insure a convenient appointment time. 10. IF YOU HAVE DISABILITY OR FAMILY LEAVE FORMS, BRING THEM TO THE OFFICE FOR PROCESSING.  DO NOT GIVE THEM TO YOUR DOCTOR.   WHEN TO CALL us [phone]: 1. Poor pain control 2. Reactions / problems with new medications (rash/itching, nausea, etc)  3. Fever over 101.5 F (38.5 C) 4. Inability to urinate 5. Nausea and/or vomiting 6. Worsening swelling or bruising 7. Continued bleeding from incision. 8. Increased pain, redness, or drainage from the incision  The clinic staff is available to answer your questions during regular business hours (8:30am-5pm).  Please dont hesitate to call and ask to speak to one of our nurses for clinical concerns.   A surgeon from [facility] is always on call at the hospitals   If you have a medical emergency, go to the nearest emergency room or call 911.    [facility], [address], [place], [place], [place]  [address] ? MAIN: [phone] ? TOLL FREE: [phone] ? FAX [phone] [email or web address]

## 2018-06-02 NOTE — Discharge Summary (Addendum)
Physician Discharge Summary  Patient ID: Manuel [name] MRN: [id number] DOB/AGE: May 01, 1946 72 y.o.  Admit date: 05/29/2018 Discharge date: 06/02/2018  Admission Diagnoses: colostomy, colon cancer  Discharge Diagnoses:  Active Problems:   Colon cancer [facility])   Discharged Condition: good  Hospital Course: Patient was admitted after laparoscopic colostomy reversal.  His diet was advanced as tolerated.  Once he was tolerating a diet and having good bowel function, he was restarted on his anticoagulation.  There was no further bleeding noted.  By postop day 4 he was tolerating a diet and his pain was controlled with Tylenol.  He was ambulating without difficulty and ready for discharge.  Consults: None  Significant Diagnostic Studies: labs: cbc, bmet  Treatments: IV hydration, analgesia: acetaminophen and surgery: lap colostomy reversal  Discharge Exam: Blood pressure (!) 141/89, pulse 72, temperature 98.7 F (37.1 C), temperature source Oral, resp. rate 18, height _0  (1.88 m), weight 99.8 kg, SpO2 92 %. General appearance: alert and cooperative GI: normal findings: soft, non-tender Incision/Wound: clean  Disposition: Discharge disposition: 01-Home or Self Care        Allergies as of 06/02/2018      Reactions   Sulfa Antibiotics Shortness Of Breath   Headaches       Medication List    TAKE these medications   acetaminophen 500 MG tablet Commonly known as:  TYLENOL Take 500 mg by mouth every 6 (six) hours as needed for moderate pain.   budesonide-formoterol 160-4.5 MCG/ACT inhaler Commonly known as:  SYMBICORT Inhale 2 puffs into the lungs 2 (two) times daily. What changed:    when to take this  reasons to take this   cyanocobalamin 1000 MCG/ML injection Commonly known as:  (VITAMIN B-12) Inject 1 mL (1,000 mcg total) into the muscle daily. Take 1000 MCG's IM daily x4 days, then 1000 MCG's IM weekly x1 month, then 1000 MCG's IM monthly. What changed:  when  to take this   diltiazem 120 MG 24 hr capsule Commonly known as:  CARDIZEM CD Take 120 mg by mouth 2 (two) times daily.   doxazosin 8 MG tablet Commonly known as:  CARDURA Take 8 mg by mouth at bedtime.   ELIQUIS 5 MG Tabs tablet Generic drug:  apixaban TK 1 T PO BID AFTER FOOD What changed:    how much to take  how to take this  when to take this   ferrous sulfate 325 (65 FE) MG tablet Take 325 mg by mouth 2 (two) times daily with a meal.   glimepiride 1 MG tablet Commonly known as:  AMARYL Take 1 mg by mouth daily with breakfast.   lisinopril 20 MG tablet Commonly known as:  PRINIVIL,ZESTRIL Take 20 mg by mouth daily.   metFORMIN 500 MG tablet Commonly known as:  GLUCOPHAGE Take 500 mg by mouth 2 (two) times daily with a meal.   pravastatin 20 MG tablet Commonly known as:  PRAVACHOL Take 20 mg by mouth every evening.   SYRINGE 3CC/22GX1-1/2" 22G X 1-1/2" 3 ML Misc 1,000 mcg by Does not apply route daily.   tiotropium 18 MCG inhalation capsule Commonly known as:  SPIRIVA Place 1 capsule (18 mcg total) into inhaler and inhale daily.      Follow-up Information    Leighton Ruff, MD. Schedule an appointment as soon as possible for a visit in 2 week(s).   Specialty:  General Surgery Contact information: [address] [place] [address] [phone]  Signed: Marria Mathison C. 02/72/5366, 10:44 AM

## 2018-06-16 DIAGNOSIS — G4733 Obstructive sleep apnea (adult) (pediatric): Secondary | ICD-10-CM | POA: Diagnosis not present

## 2018-06-23 DIAGNOSIS — M545 Low back pain: Secondary | ICD-10-CM | POA: Diagnosis not present

## 2018-06-23 DIAGNOSIS — M5442 Lumbago with sciatica, left side: Secondary | ICD-10-CM | POA: Diagnosis not present

## 2018-06-23 DIAGNOSIS — M25551 Pain in right hip: Secondary | ICD-10-CM | POA: Diagnosis not present

## 2018-06-23 DIAGNOSIS — R29898 Other symptoms and signs involving the musculoskeletal system: Secondary | ICD-10-CM | POA: Diagnosis not present

## 2018-06-23 DIAGNOSIS — M25552 Pain in left hip: Secondary | ICD-10-CM | POA: Diagnosis not present

## 2018-06-26 DIAGNOSIS — N189 Chronic kidney disease, unspecified: Secondary | ICD-10-CM | POA: Diagnosis not present

## 2018-06-26 DIAGNOSIS — D631 Anemia in chronic kidney disease: Secondary | ICD-10-CM | POA: Diagnosis not present

## 2018-06-26 DIAGNOSIS — G4733 Obstructive sleep apnea (adult) (pediatric): Secondary | ICD-10-CM | POA: Diagnosis not present

## 2018-06-26 DIAGNOSIS — R809 Proteinuria, unspecified: Secondary | ICD-10-CM | POA: Diagnosis not present

## 2018-06-26 DIAGNOSIS — E785 Hyperlipidemia, unspecified: Secondary | ICD-10-CM | POA: Diagnosis not present

## 2018-06-26 DIAGNOSIS — N2581 Secondary hyperparathyroidism of renal origin: Secondary | ICD-10-CM | POA: Diagnosis not present

## 2018-06-26 DIAGNOSIS — N182 Chronic kidney disease, stage 2 (mild): Secondary | ICD-10-CM | POA: Diagnosis not present

## 2018-06-26 DIAGNOSIS — E1122 Type 2 diabetes mellitus with diabetic chronic kidney disease: Secondary | ICD-10-CM | POA: Diagnosis not present

## 2018-06-26 DIAGNOSIS — I129 Hypertensive chronic kidney disease with stage 1 through stage 4 chronic kidney disease, or unspecified chronic kidney disease: Secondary | ICD-10-CM | POA: Diagnosis not present

## 2018-06-26 DIAGNOSIS — I4891 Unspecified atrial fibrillation: Secondary | ICD-10-CM | POA: Diagnosis not present

## 2018-06-26 DIAGNOSIS — N400 Enlarged prostate without lower urinary tract symptoms: Secondary | ICD-10-CM | POA: Diagnosis not present

## 2018-07-12 DIAGNOSIS — Z961 Presence of intraocular lens: Secondary | ICD-10-CM | POA: Diagnosis not present

## 2018-07-12 DIAGNOSIS — Z7984 Long term (current) use of oral hypoglycemic drugs: Secondary | ICD-10-CM | POA: Diagnosis not present

## 2018-07-12 DIAGNOSIS — E119 Type 2 diabetes mellitus without complications: Secondary | ICD-10-CM | POA: Diagnosis not present

## 2018-07-12 DIAGNOSIS — H2512 Age-related nuclear cataract, left eye: Secondary | ICD-10-CM | POA: Diagnosis not present

## 2018-07-18 DIAGNOSIS — G4733 Obstructive sleep apnea (adult) (pediatric): Secondary | ICD-10-CM | POA: Diagnosis not present

## 2018-08-18 DIAGNOSIS — G4733 Obstructive sleep apnea (adult) (pediatric): Secondary | ICD-10-CM | POA: Diagnosis not present

## 2018-09-16 ENCOUNTER — Other Ambulatory Visit: Payer: Self-pay | Admitting: Cardiology

## 2018-09-16 DIAGNOSIS — I714 Abdominal aortic aneurysm, without rupture, unspecified: Secondary | ICD-10-CM

## 2018-09-18 DIAGNOSIS — G4733 Obstructive sleep apnea (adult) (pediatric): Secondary | ICD-10-CM | POA: Diagnosis not present

## 2018-09-19 ENCOUNTER — Encounter: Payer: Self-pay | Admitting: Cardiology

## 2018-09-19 ENCOUNTER — Ambulatory Visit: Payer: Medicare HMO | Admitting: Cardiology

## 2018-09-19 VITALS — BP 131/84 | HR 71 | Ht 75.0 in | Wt 210.0 lb

## 2018-09-19 DIAGNOSIS — I10 Essential (primary) hypertension: Secondary | ICD-10-CM

## 2018-09-19 DIAGNOSIS — I4821 Permanent atrial fibrillation: Secondary | ICD-10-CM

## 2018-09-19 DIAGNOSIS — E7800 Pure hypercholesterolemia, unspecified: Secondary | ICD-10-CM

## 2018-09-19 DIAGNOSIS — G4733 Obstructive sleep apnea (adult) (pediatric): Secondary | ICD-10-CM

## 2018-09-19 HISTORY — DX: Pure hypercholesterolemia, unspecified: E78.00

## 2018-09-19 NOTE — Progress Notes (Signed)
Subjective:  Primary Physician:  Jani Gravel, MD  Patient ID: Manuel [name], male    DOB: [birth year], 73 y.o.   MRN: [id number]  Chief Complaint  Patient presents with  . Atrial Fibrillation    6 month F/U    HPI: Manuel [name]  is a 73 y.o. male  with history of permanent atrial fibrillation, hypertension, diabetes mellitus which is well controlled presents here for followup. He quit smoking in 2016.   Sleep study in 2016 revealed severe obstructive sleep apnea.  He received his CPAP machine in January 2017 and has been using this regularly and has been compliant.  He had colonic stricture and pneumatosis leading to obstruction on 12/13/2017 and had colostomy which was reversed. He is now doing well.  Is presently doing well and has not had any chest pain or shortness of breath, tolerating anticoagulation well.  Past Medical History:  Diagnosis Date  . AAA (abdominal aortic aneurysm) ([place])    per cardiologist note dated 03-14-2018 last duplex , 30-12m  (dr gEinar Gip  . Acute GI bleeding 12/19/2017  . Allergic rhinitis, seasonal   . Anemia secondary to renal failure   . Anticoagulant long-term use    ELIQUIS  . Arthritis    LOWER BACK  . B12 deficiency   . BPH (benign prostatic hyperplasia)   . CKD (chronic kidney disease), stage III ([place]   . Colonic stricture ([place] 12/13/2017  . Colostomy present ([place]   . Congenital absence of left kidney [facility] dr detarding   per CT 12-11-2017 and 01-02-2018  . Duodenal ulcer 12/20/2017  . Dyslipidemia   . Esophageal candidiasis ([place] 12/19/2017  . Gastric ulcer without hemorrhage or perforation 12/20/2017  . History of bowel resection 12/13/2017   for sigmoid stricture-- emergency sigmoidectomy  . Hypercholesteremia 09/19/2018  . Hyperparathyroidism, secondary renal ([place]   . Hypertension   . Left carotid bruit   . OSA on CPAP    [facility]  . Permanent atrial  fibrillation 2017   cardiologist-  dr gEinar Gip . Solitary right kidney   . Type 2 diabetes mellitus ([place]    followed by pcp  . Wears glasses   . Wears partial dentures    UPPER AND LOWER    Past Surgical History:  Procedure Laterality Date  . BIOPSY  12/19/2017   Procedure: BIOPSY;  Surgeon: HCarol Ada MD;  Location: [facility];  Service: Endoscopy;;  . CARDIOVASCULAR STRESS TEST  11/16/2011   low risk nuclear study w/ no ischemia/  normal LV function and wall motion , ef 60%  . CARDIOVERSION  12/18/2011   Procedure: CARDIOVERSION;  Surgeon: JLaverda Page MD;  [facility]  Service: Cardiovascular;  Laterality: N/A;  . CATARACT EXTRACTION W/ INTRAOCULAR LENS IMPLANT Right 2015  . COLON RESECTION N/A 12/13/2017   Procedure: LAPAROSCOPIC END COLOSTOMY HARTMAN'S PROCEDURE;  Surgeon: TLeighton Ruff MD;  Location: [facility];  Service: General;  Laterality: N/A;  . COLOSTOMY TAKEDOWN N/A 05/29/2018   Procedure: LAPAROSCOPIC COLOSTOMY REVERSAL ERAS PATHWAY, RIGID PROCTOSCOPY;  Surgeon: TLeighton Ruff MD;  Location: [facility];  Service: General;  Laterality: N/A;  . CYST REMOVED FROM UPPER BACK  09/2017   BENIGN  . ESOPHAGOGASTRODUODENOSCOPY N/A 12/19/2017   Procedure: ESOPHAGOGASTRODUODENOSCOPY (EGD);  Surgeon: HCarol Ada MD;  [facility];  Service: Endoscopy;  Laterality: N/A;  . FLEXIBLE SIGMOIDOSCOPY N/A 12/13/2017   Procedure: FLEXIBLE SIGMOIDOSCOPY;  Surgeon: HCarol Ada MD;  [facility];  Service: Endoscopy;  Laterality: N/A;  . INGUINAL HERNIA REPAIR Left 1980s   AND REMOVAL CYST ON UPPER BACK  . PAROTIDECTOMY Right 09-21-1999   dr Janace Hoard _0    salivery gland mass  (benign warthin's tumor)  . TRANSTHORACIC ECHOCARDIOGRAM  01/04/2018   mild LVH, ef 92-44%, grade 2 diastolic function/  trivial AR and MR/  mild LAE/  mild to moderate TR    Social History   Socioeconomic History  . Marital status: Married    Spouse name: Not on file  . Number of  children: 1  . Years of education: Not on file  . Highest education level: Not on file  Occupational History  . Occupation: Theme park manager    Comment: [employer]  Social Needs  . Financial resource strain: Not on file  . Food insecurity:    Worry: Not on file    Inability: Not on file  . Transportation needs:    Medical: Not on file    Non-medical: Not on file  Tobacco Use  . Smoking status: Former Smoker    Packs/day: 1.00    Years: 30.00    Pack years: 30.00    Types: Cigarettes    Last attempt to quit: 01/20/2015    Years since quitting: 3.6  . Smokeless tobacco: Never Used  Substance and Sexual Activity  . Alcohol use: No    Alcohol/week: 0.0 standard drinks  . Drug use: No  . Sexual activity: Not on file  Lifestyle  . Physical activity:    Days per week: Not on file    Minutes per session: Not on file  . Stress: Not on file  Relationships  . Social connections:    Talks on phone: Not on file    Gets together: Not on file    Attends religious service: Not on file    Active member of club or organization: Not on file    Attends meetings of clubs or organizations: Not on file    Relationship status: Not on file  . Intimate partner violence:    Fear of current or ex partner: Not on file    Emotionally abused: Not on file    Physically abused: Not on file    Forced sexual activity: Not on file  Other Topics Concern  . Not on file  Social History Narrative   Drinks 2-3 caffeine drinks a day     Current Outpatient Medications on File Prior to Visit  Medication Sig Dispense Refill  . acetaminophen (TYLENOL) 500 MG tablet Take 500 mg by mouth every 6 (six) hours as needed for moderate pain.    . cyanocobalamin (,VITAMIN B-12,) 1000 MCG/ML injection Inject 1 mL (1,000 mcg total) into the muscle daily. Take 1000 MCG's IM daily x4 days, then 1000 MCG's IM weekly x1 month, then 1000 MCG's IM monthly. (Patient taking differently: Inject 1,000 mcg into the muscle every 30 (thirty)  days. Take 1000 MCG's IM daily x4 days, then 1000 MCG's IM weekly x1 month, then 1000 MCG's IM monthly.) 25 mL 0  . diltiazem (CARDIZEM CD) 120 MG 24 hr capsule Take 120 mg by mouth 2 (two) times daily.   5  . doxazosin (CARDURA) 8 MG tablet Take 8 mg by mouth at bedtime.     Marland Kitchen ELIQUIS 5 MG TABS tablet TK 1 T PO BID AFTER FOOD (Patient taking differently: Take 5 mg by mouth 2 (two) times daily after a meal. TK 1 T PO BID AFTER  FOOD) 60 tablet 0  . ferrous sulfate 325 (65 FE) MG tablet Take 325 mg by mouth 2 (two) times daily with a meal.    . glimepiride (AMARYL) 1 MG tablet Take 1 mg by mouth daily with breakfast.    . lisinopril (PRINIVIL,ZESTRIL) 20 MG tablet Take 20 mg by mouth daily.    . metFORMIN (GLUCOPHAGE) 500 MG tablet Take 500 mg by mouth 2 (two) times daily with a meal.   5  . pravastatin (PRAVACHOL) 20 MG tablet Take 20 mg by mouth every evening.     . Syringe/Needle, Disp, (SYRINGE 3CC/22GX1-1/2") 22G X 1-1/2" 3 ML MISC 1,000 mcg by Does not apply route daily. 50 each 0  . budesonide-formoterol (SYMBICORT) 160-4.5 MCG/ACT inhaler Inhale 2 puffs into the lungs 2 (two) times daily. (Patient not taking: Reported on 09/19/2018) 1 Inhaler 0  . tiotropium (SPIRIVA HANDIHALER) 18 MCG inhalation capsule Place 1 capsule (18 mcg total) into inhaler and inhale daily. (Patient not taking: Reported on 03/03/2018) 30 capsule 0   No current facility-administered medications on file prior to visit.      Review of Systems  Constitutional: Negative for malaise/fatigue and weight loss.  Respiratory: Negative for cough, hemoptysis and shortness of breath.   Cardiovascular: Positive for leg swelling (left foot). Negative for chest pain, palpitations and claudication.  Gastrointestinal: Negative for abdominal pain, blood in stool, constipation, heartburn and vomiting.  Genitourinary: Negative for dysuria.  Musculoskeletal: Negative for joint pain and myalgias.  Neurological: Negative for dizziness,  focal weakness and headaches.  Endo/Heme/Allergies: Does not bruise/bleed easily.  Psychiatric/Behavioral: Negative for depression. The patient is not nervous/anxious.   All other systems reviewed and are negative.      Objective:  Blood pressure 131/84, pulse 71, height _0  (1.905 m), weight 210 lb (95.3 kg), SpO2 95 %. Body mass index is 26.25 kg/m.  Physical Exam  Constitutional: He appears well-developed and well-nourished. No distress.  HENT:  Head: Atraumatic.  Eyes: Conjunctivae are normal.  Neck: Neck supple. No JVD present. No thyromegaly present.  Cardiovascular: Normal rate, normal heart sounds and intact distal pulses. An irregular rhythm present. Exam reveals no gallop.  No murmur heard. Pulses:      Carotid pulses are 2+ on the right side and 2+ on the left side.      Radial pulses are 2+ on the right side and 2+ on the left side.       Popliteal pulses are 0 on the right side.       Dorsalis pedis pulses are 1+ on the right side and 1+ on the left side.       Posterior tibial pulses are 2+ on the right side and 1+ on the left side.  Pulmonary/Chest: Effort normal and breath sounds normal.  Abdominal: Soft. Bowel sounds are normal.  Musculoskeletal: Normal range of motion.        General: No edema.  Neurological: He is alert.  Skin: Skin is warm and dry.  Psychiatric: He has a normal mood and affect.    CARDIAC STUDIES:   No AAA by CTA abdomen in 2019, cancel US abdomen. Assessment & Recommendations:   1. Permanent atrial fibrillation EKG 09/19/2018: Atrial fibrillation with controlled ventricular on September 71 bpm, normal axis, anteroseptal infarct old.  No evidence of ischemia.  2. Essential hypertension  3. OSA (obstructive sleep apnea)  4. Hypercholesteremia  5. Laboratory exam  08/15/2017: CBC normal. Creatinine 1.2, EGFR 63/77, potassium 4.1, CMP normal. Hemoglobin  A1c 6%. Cholesterol 98, triglycerides 46, HDL 36, LDL 53.  12/16/2015:  Creatinine 1.19, potassium 4.2, CMP normal, HbA1c 6.5%, total cholesterol 103, triglycerides 54, HDL 38, LDL 54, WBC 3.4, CBC otherwise normal   Recommendation:  Patient is presently doing well, I did evaluate his labs from the hospital, although he was mildly anemic all the surgical issues are resolved.  He'll continue to follow-up with his PCP for his routine labs, blood pressure is well controlled, he does not need future abdominal aortic aneurysm surveillance as CT angiogram performed in the hospital does not reveal any evidence of AAA.  With regard to atrial fibrillation, is tolerating anticoagulation well.  He has not had any rapid ventricular response.  I'll see him back on an annual basis.  Lipids are well controlled and managed by his PCP.  Adrian Prows, MD, [facility] 09/19/2018, 12:45 PM [facility] [place] Pager: [phone] Office: [phone] If no answer Cell [phone]

## 2018-10-07 ENCOUNTER — Other Ambulatory Visit: Payer: Self-pay

## 2018-10-19 DIAGNOSIS — G4733 Obstructive sleep apnea (adult) (pediatric): Secondary | ICD-10-CM | POA: Diagnosis not present

## 2018-11-10 DIAGNOSIS — R69 Illness, unspecified: Secondary | ICD-10-CM | POA: Diagnosis not present

## 2018-11-14 IMAGING — DX DG ABDOMEN 2V
3 series · 3 of 3 positions shown · non-contrast
Comparison: 01/03/2018

CLINICAL DATA: Small-bowel obstruction

EXAM:
ABDOMEN - 2 VIEW

[abdomen supine (1 of 2)]
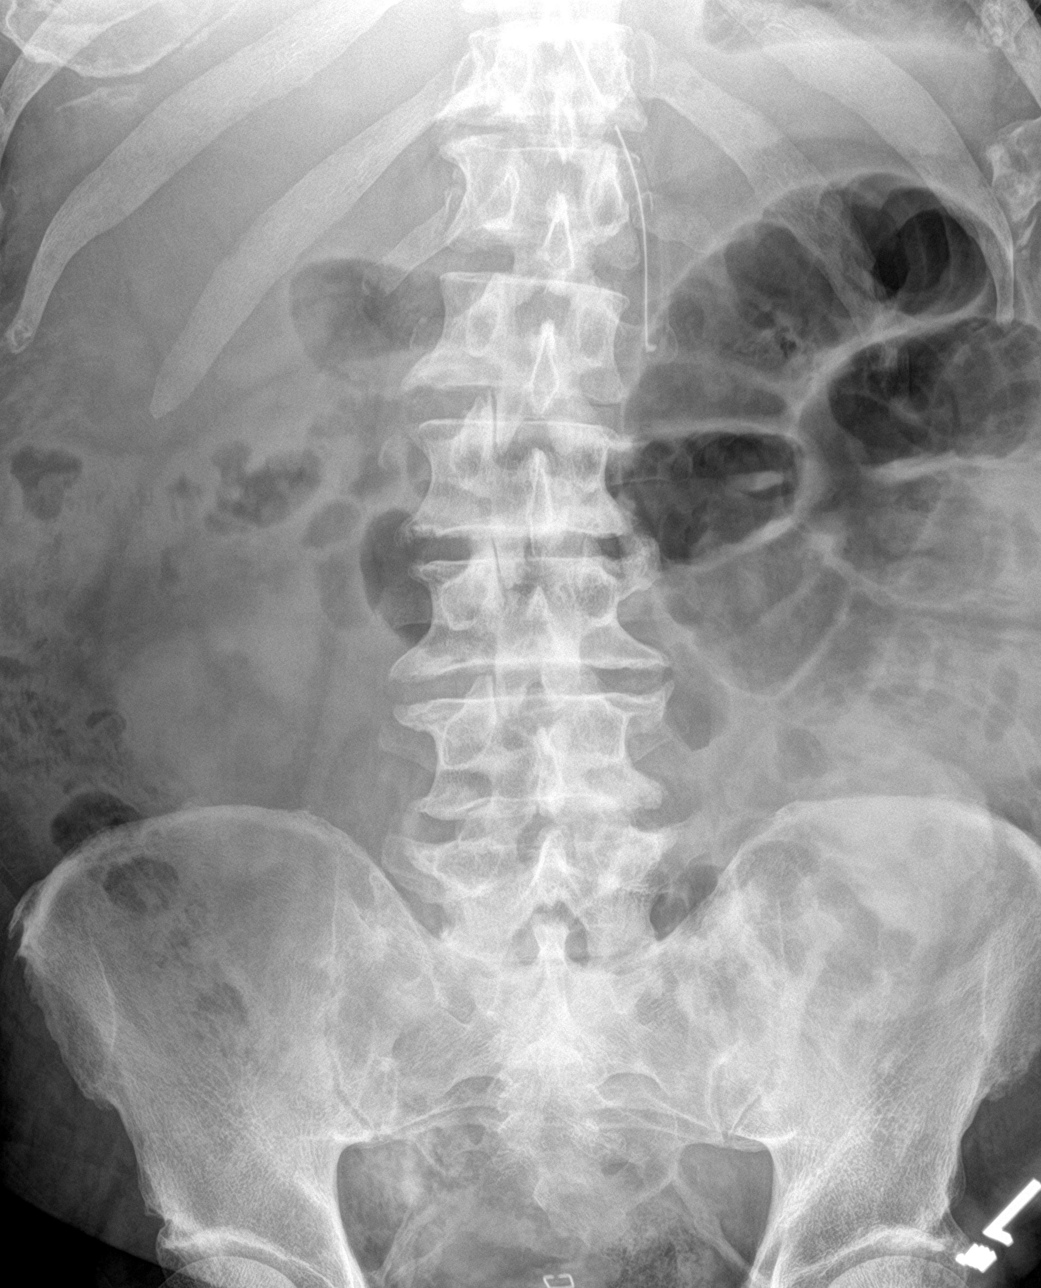

[abdomen supine (2 of 2)]
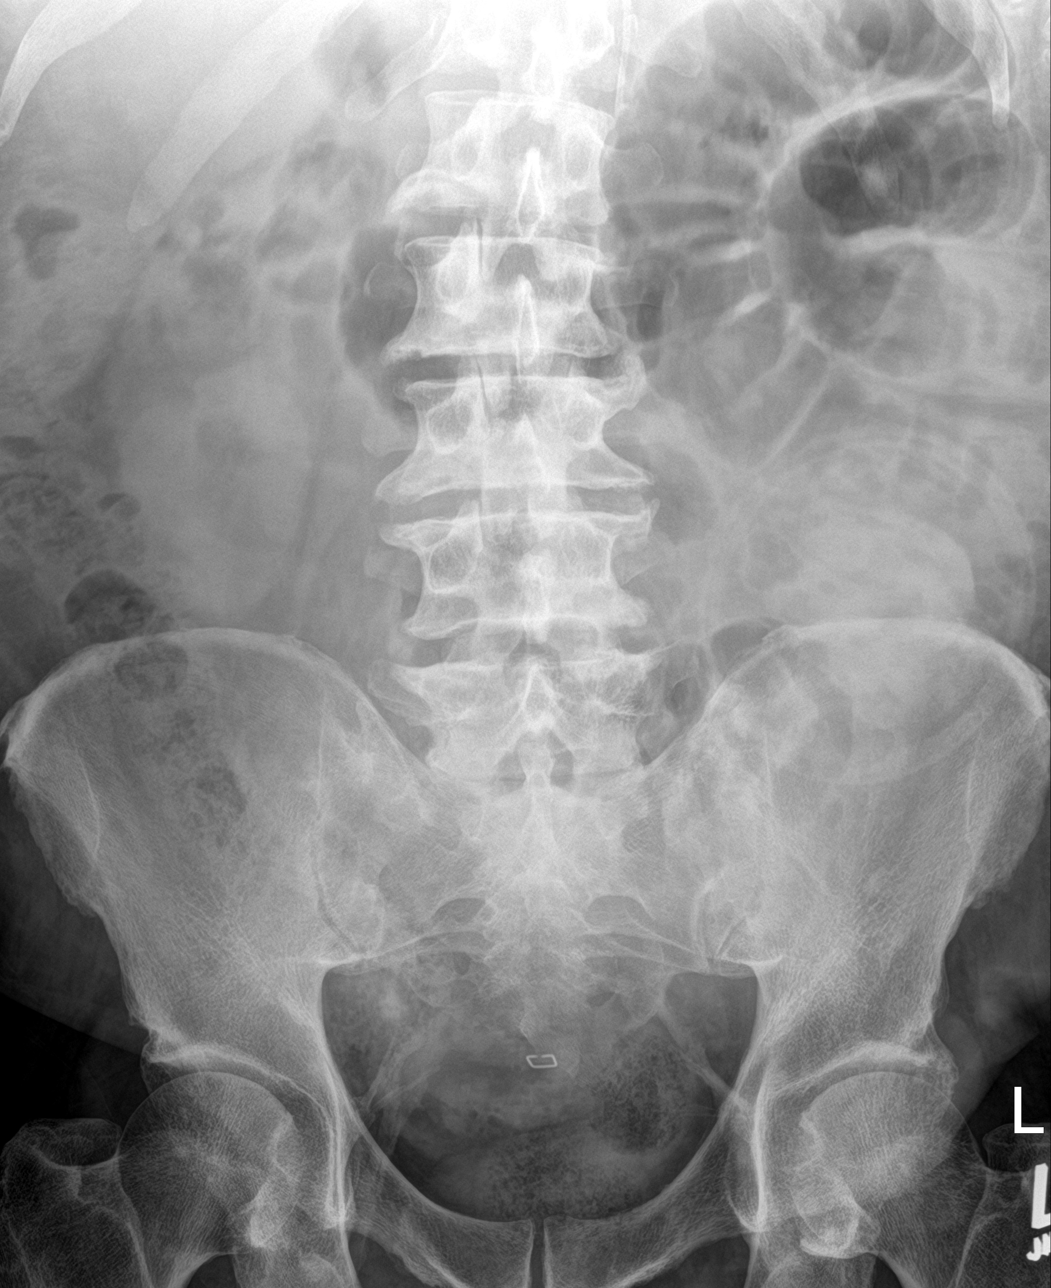

[abdomen erect]
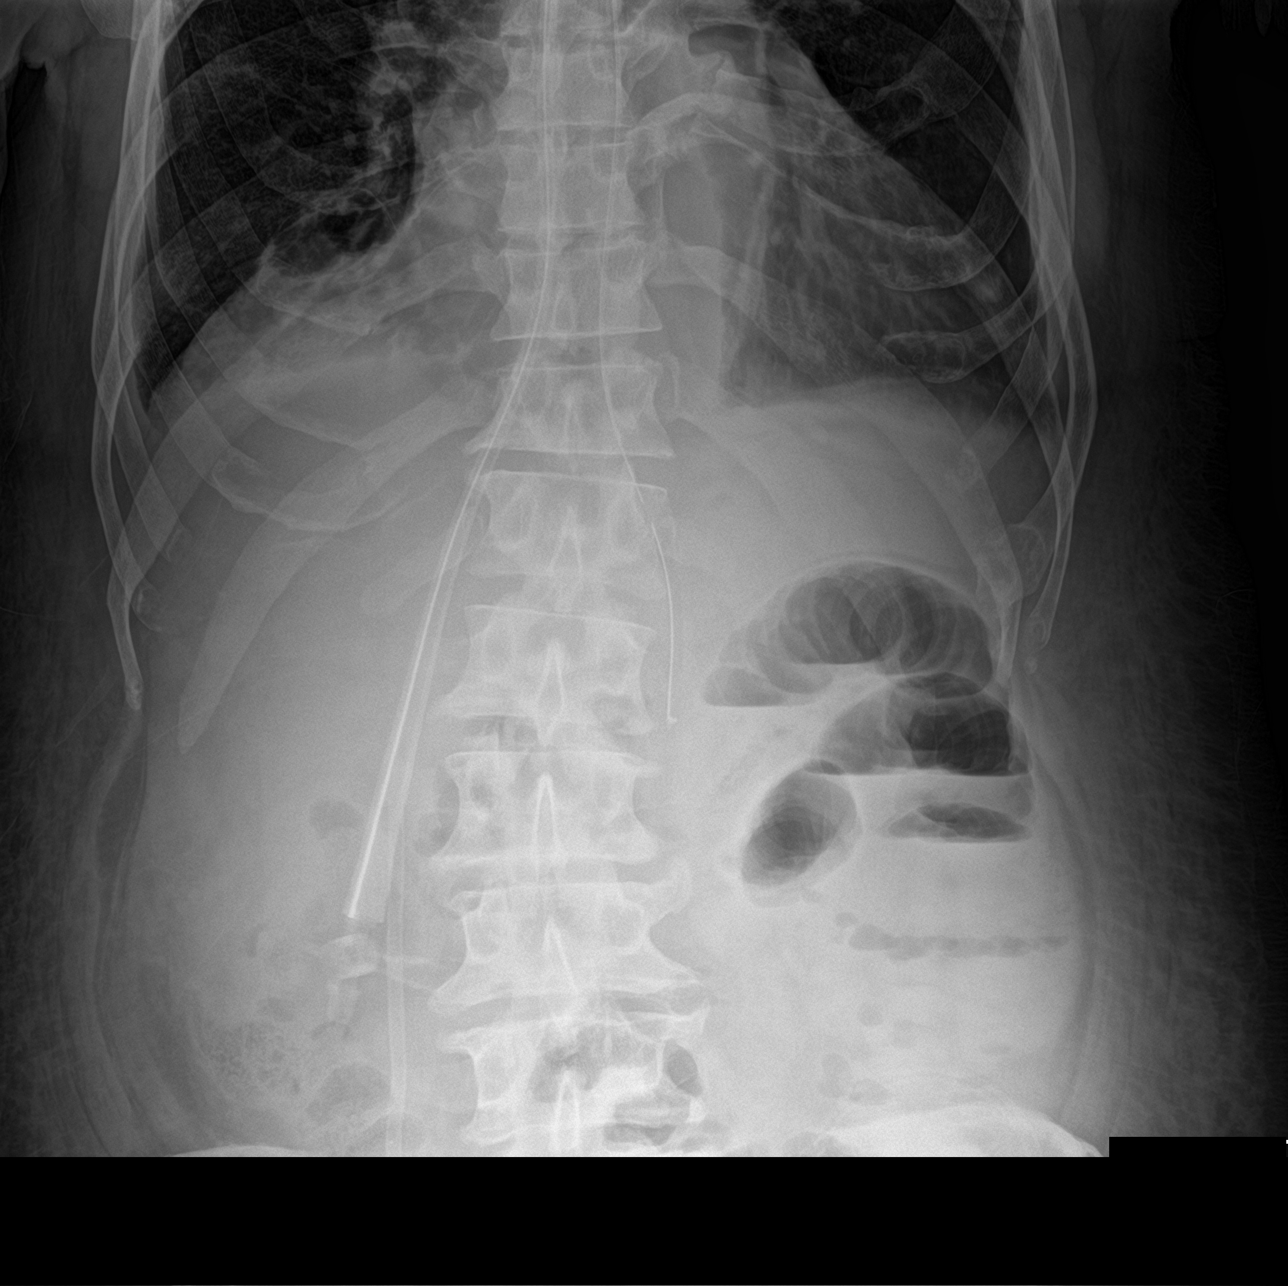

[3 of 3 positions shown; findings below may reference images not displayed]

FINDINGS: NG has been pulled back and now is in the body the stomach. Ostomy
in the left abdomen.

Dilated small bowel loops with air-fluid levels in left abdomen
similar to the prior study. No free air. Colon decompressed.
IMPRESSION: Small-bowel obstruction pattern unchanged. NG in the body of the
stomach. No free air.

## 2018-11-16 IMAGING — DX DG ABDOMEN 2V
2 series · 2 of 2 positions shown · non-contrast
Comparison: CT abdomen and pelvis January 02, 2018; abdominal
radiographs January 05, 2018

CLINICAL DATA: Small bowel obstruction

EXAM:
ABDOMEN - 2 VIEW

[abdomen erect]
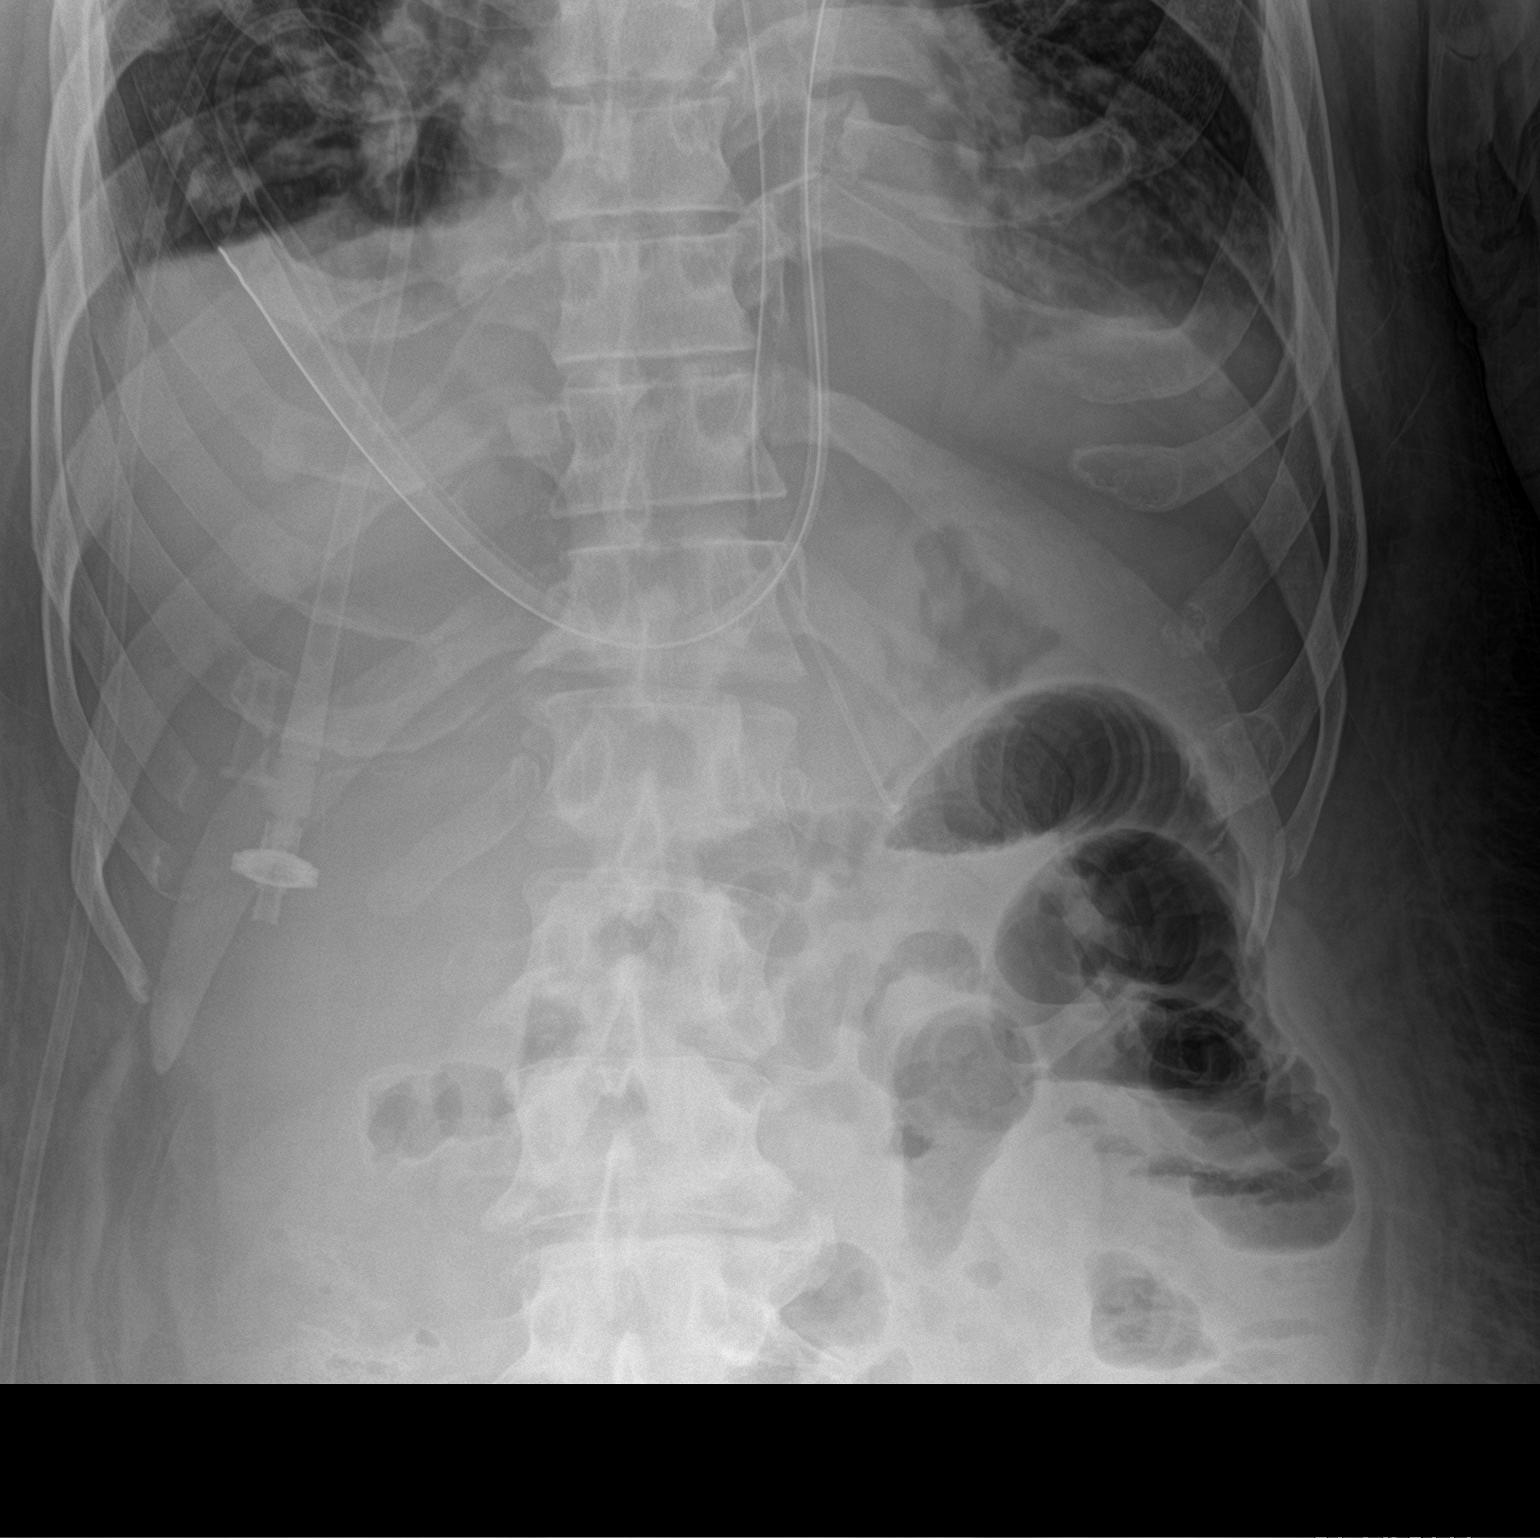

[abdomen supine]
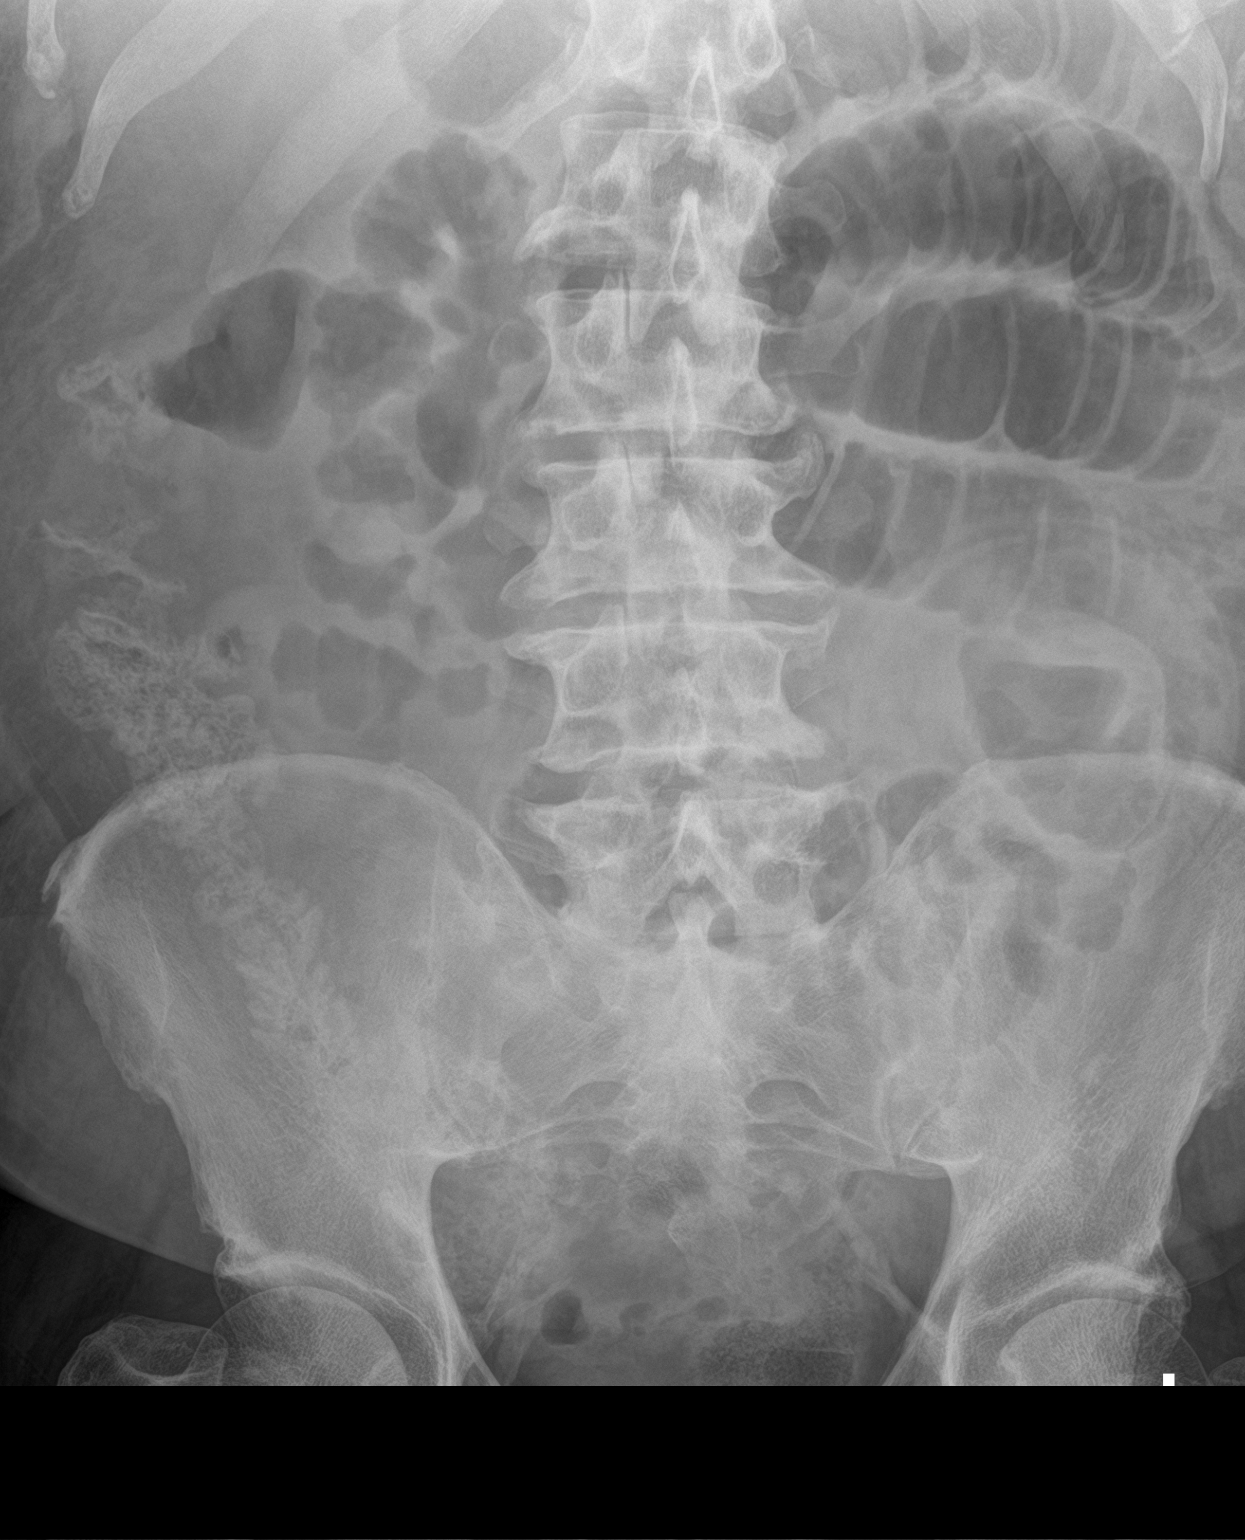

[2 of 2 positions shown; findings below may reference images not displayed]

FINDINGS: Supine and upright images were obtained. Nasogastric tube tip and
side port are in the stomach. There remain multiple loops of dilated
small bowel with scattered air-fluid levels consistent with
persistent obstruction. Note that a small amount of contrast is seen
in the proximal colon. No free air. There is iliac artery
atherosclerosis bilaterally. Visualized lung bases are clear.
IMPRESSION: Persistent small bowel obstruction. No free air. Nasogastric tube
tip and side port in stomach. Note that there is a small amount of
contrast in the colon.

Extensive iliac artery atherosclerosis noted.

## 2018-11-19 DIAGNOSIS — G4733 Obstructive sleep apnea (adult) (pediatric): Secondary | ICD-10-CM | POA: Diagnosis not present

## 2018-12-20 DIAGNOSIS — G4733 Obstructive sleep apnea (adult) (pediatric): Secondary | ICD-10-CM | POA: Diagnosis not present

## 2019-01-05 DIAGNOSIS — I10 Essential (primary) hypertension: Secondary | ICD-10-CM | POA: Diagnosis not present

## 2019-01-05 DIAGNOSIS — Z0000 Encounter for general adult medical examination without abnormal findings: Secondary | ICD-10-CM | POA: Diagnosis not present

## 2019-01-05 DIAGNOSIS — E119 Type 2 diabetes mellitus without complications: Secondary | ICD-10-CM | POA: Diagnosis not present

## 2019-01-09 DIAGNOSIS — D631 Anemia in chronic kidney disease: Secondary | ICD-10-CM | POA: Diagnosis not present

## 2019-01-09 DIAGNOSIS — G4733 Obstructive sleep apnea (adult) (pediatric): Secondary | ICD-10-CM | POA: Diagnosis not present

## 2019-01-09 DIAGNOSIS — R809 Proteinuria, unspecified: Secondary | ICD-10-CM | POA: Diagnosis not present

## 2019-01-09 DIAGNOSIS — I4891 Unspecified atrial fibrillation: Secondary | ICD-10-CM | POA: Diagnosis not present

## 2019-01-09 DIAGNOSIS — N2581 Secondary hyperparathyroidism of renal origin: Secondary | ICD-10-CM | POA: Diagnosis not present

## 2019-01-09 DIAGNOSIS — N400 Enlarged prostate without lower urinary tract symptoms: Secondary | ICD-10-CM | POA: Diagnosis not present

## 2019-01-09 DIAGNOSIS — N182 Chronic kidney disease, stage 2 (mild): Secondary | ICD-10-CM | POA: Diagnosis not present

## 2019-01-09 DIAGNOSIS — I129 Hypertensive chronic kidney disease with stage 1 through stage 4 chronic kidney disease, or unspecified chronic kidney disease: Secondary | ICD-10-CM | POA: Diagnosis not present

## 2019-01-09 DIAGNOSIS — E785 Hyperlipidemia, unspecified: Secondary | ICD-10-CM | POA: Diagnosis not present

## 2019-01-09 DIAGNOSIS — E1122 Type 2 diabetes mellitus with diabetic chronic kidney disease: Secondary | ICD-10-CM | POA: Diagnosis not present

## 2019-01-12 DIAGNOSIS — E119 Type 2 diabetes mellitus without complications: Secondary | ICD-10-CM | POA: Diagnosis not present

## 2019-01-12 DIAGNOSIS — M5442 Lumbago with sciatica, left side: Secondary | ICD-10-CM | POA: Diagnosis not present

## 2019-01-12 DIAGNOSIS — Z0000 Encounter for general adult medical examination without abnormal findings: Secondary | ICD-10-CM | POA: Diagnosis not present

## 2019-01-12 DIAGNOSIS — K5730 Diverticulosis of large intestine without perforation or abscess without bleeding: Secondary | ICD-10-CM | POA: Diagnosis not present

## 2019-01-12 DIAGNOSIS — I10 Essential (primary) hypertension: Secondary | ICD-10-CM | POA: Diagnosis not present

## 2019-01-12 DIAGNOSIS — E7800 Pure hypercholesterolemia, unspecified: Secondary | ICD-10-CM | POA: Diagnosis not present

## 2019-03-09 ENCOUNTER — Other Ambulatory Visit: Payer: Self-pay

## 2019-03-09 ENCOUNTER — Ambulatory Visit: Payer: Medicare HMO | Admitting: Adult Health

## 2019-03-09 ENCOUNTER — Encounter: Payer: Self-pay | Admitting: Adult Health

## 2019-03-09 VITALS — BP 130/76 | HR 77 | Temp 97.3°F | Ht 75.0 in | Wt 222.0 lb

## 2019-03-09 DIAGNOSIS — Z9989 Dependence on other enabling machines and devices: Secondary | ICD-10-CM

## 2019-03-09 DIAGNOSIS — G4733 Obstructive sleep apnea (adult) (pediatric): Secondary | ICD-10-CM | POA: Diagnosis not present

## 2019-03-09 NOTE — Patient Instructions (Signed)
Continue using CPAP nightly and greater than 4 hours each night °If your symptoms worsen or you develop new symptoms please let us know.  ° °

## 2019-03-09 NOTE — Progress Notes (Addendum)
PATIENT: [name] DOB: 28-Apr-1946  REASON FOR VISIT: follow up HISTORY FROM: patient  HISTORY OF PRESENT ILLNESS: Today 03/09/19:  Mr. Niu is a 73 year old male with a history of obstructive sleep apnea on CPAP.  He returns today for follow-up.  His download indicates that he uses machine nightly for compliance of 100%.  He uses machine greater than 4 hours each night.  On average he uses his machine 7 hours and 52 minutes.  His residual AHI is 2.8 on 8 cm of water with EPR of 2.  His leak in the 95th percentile is 29.4 L/min.  Overall he reports that he is doing well.  Denies any new issues.    REVIEW OF SYSTEMS: Out of a complete 14 system review of symptoms, the patient complains only of the following symptoms, and all other reviewed systems are negative.  Epworth sleepiness score is 7 fatigue severity score 33  ALLERGIES: Allergies  Allergen Reactions   Sulfa Antibiotics Shortness Of Breath    Headaches     HOME MEDICATIONS: Outpatient Medications Prior to Visit  Medication Sig Dispense Refill   acetaminophen (TYLENOL) 500 MG tablet Take 500 mg by mouth every 6 (six) hours as needed for moderate pain.     cyanocobalamin (,VITAMIN B-12,) 1000 MCG/ML injection Inject 1 mL (1,000 mcg total) into the muscle daily. Take 1000 MCG's IM daily x4 days, then 1000 MCG's IM weekly x1 month, then 1000 MCG's IM monthly. (Patient taking differently: Inject 1,000 mcg into the muscle every 30 (thirty) days. Take 1000 MCG's IM daily x4 days, then 1000 MCG's IM weekly x1 month, then 1000 MCG's IM monthly.) 25 mL 0   diltiazem (CARDIZEM CD) 120 MG 24 hr capsule Take 120 mg by mouth 2 (two) times daily.   5   doxazosin (CARDURA) 8 MG tablet Take 8 mg by mouth at bedtime.      ELIQUIS 5 MG TABS tablet TK 1 T PO BID AFTER FOOD (Patient taking differently: Take 5 mg by mouth 2 (two) times daily after a meal. TK 1 T PO BID AFTER FOOD) 60 tablet 0   glimepiride (AMARYL) 1 MG tablet  Take 1 mg by mouth daily with breakfast.     lisinopril (PRINIVIL,ZESTRIL) 20 MG tablet Take 20 mg by mouth at bedtime.      metFORMIN (GLUCOPHAGE) 500 MG tablet Take 500 mg by mouth 2 (two) times daily with a meal.   5   pravastatin (PRAVACHOL) 20 MG tablet Take 20 mg by mouth every evening.      Syringe/Needle, Disp, (SYRINGE 3CC/22GX1-1/2") 22G X 1-1/2" 3 ML MISC 1,000 mcg by Does not apply route daily. 50 each 0   budesonide-formoterol (SYMBICORT) 160-4.5 MCG/ACT inhaler Inhale 2 puffs into the lungs 2 (two) times daily. (Patient not taking: Reported on 09/19/2018) 1 Inhaler 0   ferrous sulfate 325 (65 FE) MG tablet Take 325 mg by mouth 2 (two) times daily with a meal.     furosemide (LASIX) 40 MG tablet Take 40 mg by mouth every morning.     tiotropium (SPIRIVA HANDIHALER) 18 MCG inhalation capsule Place 1 capsule (18 mcg total) into inhaler and inhale daily. (Patient not taking: Reported on 03/03/2018) 30 capsule 0   No facility-administered medications prior to visit.     PAST MEDICAL HISTORY: Past Medical History:  Diagnosis Date   AAA (abdominal aortic aneurysm) ([place])    per cardiologist note dated 03-14-2018 last duplex , 30-52m  (dr gEinar Gip  Acute GI bleeding 12/19/2017   Allergic rhinitis, seasonal    Anemia secondary to renal failure    Anticoagulant long-term use    ELIQUIS   Arthritis    LOWER BACK   B12 deficiency    BPH (benign prostatic hyperplasia)    CKD (chronic kidney disease), stage III (HCC)    Colonic stricture ([place]) 12/13/2017   Colostomy present ([place])    Congenital absence of left kidney followed by nephrologist at [facility]--- dr detarding   per CT 12-11-2017 and 01-02-2018   Duodenal ulcer 12/20/2017   Dyslipidemia    Esophageal candidiasis ([place]) 12/19/2017   Gastric ulcer without hemorrhage or perforation 12/20/2017   History of bowel resection 12/13/2017   for sigmoid stricture-- emergency sigmoidectomy    Hypercholesteremia 09/19/2018   Hyperparathyroidism, secondary renal ([place])    Hypertension    Left carotid bruit    OSA on CPAP    followed by [facility]   Permanent atrial fibrillation 2017   cardiologist-  dr Einar Gip   Solitary right kidney    Type 2 diabetes mellitus ([place])    followed by pcp   Wears glasses    Wears partial dentures    UPPER AND LOWER    PAST SURGICAL HISTORY: Past Surgical History:  Procedure Laterality Date   BIOPSY  12/19/2017   Procedure: BIOPSY;  Surgeon: Carol Ada, MD;  [facility];  Service: Endoscopy;;   CARDIOVASCULAR STRESS TEST  11/16/2011   low risk nuclear study w/ no ischemia/  normal LV function and wall motion , ef 60%   CARDIOVERSION  12/18/2011   Procedure: CARDIOVERSION;  Surgeon: Laverda Page, MD;  Location: [facility];  Service: Cardiovascular;  Laterality: N/A;   CATARACT EXTRACTION W/ INTRAOCULAR LENS IMPLANT Right 2015   COLON RESECTION N/A 12/13/2017   Procedure: LAPAROSCOPIC END COLOSTOMY HARTMAN'S PROCEDURE;  Surgeon: Leighton Ruff, MD;  [facility];  Service: General;  Laterality: N/A;   COLOSTOMY TAKEDOWN N/A 05/29/2018   Procedure: LAPAROSCOPIC COLOSTOMY REVERSAL ERAS PATHWAY, RIGID PROCTOSCOPY;  Surgeon: Leighton Ruff, MD;  [facility];  Service: General;  Laterality: N/A;   CYST REMOVED FROM UPPER BACK  09/2017   BENIGN   ESOPHAGOGASTRODUODENOSCOPY N/A 12/19/2017   Procedure: ESOPHAGOGASTRODUODENOSCOPY (EGD);  Surgeon: Carol Ada, MD;  Location[facility];  Service: Endoscopy;  Laterality: N/A;   FLEXIBLE SIGMOIDOSCOPY N/A 12/13/2017   Procedure: FLEXIBLE SIGMOIDOSCOPY;  Surgeon: Carol Ada, MD;  [facility];  Service: Endoscopy;  Laterality: N/A;   INGUINAL HERNIA REPAIR Left 1980s   AND REMOVAL CYST ON UPPER BACK   PAROTIDECTOMY Right 09-21-1999   dr Janace Hoard _0    salivery gland mass  (benign warthin's tumor)   TRANSTHORACIC ECHOCARDIOGRAM  01/04/2018    mild LVH, ef 83-38%, grade 2 diastolic function/  trivial AR and MR/  mild LAE/  mild to moderate TR    FAMILY HISTORY: Family History  Problem Relation Age of Onset   Cancer Father    Alzheimer's disease Father    Cancer Mother    Kidney disease Paternal Aunt     SOCIAL HISTORY: Social History   Socioeconomic History   Marital status: Married    Spouse name: Not on file   Number of children: 1   Years of education: Not on file   Highest education level: Not on file  Occupational History   Occupation: Theme park manager    Comment: [employer]  Social Needs   Financial resource strain: Not on file  Food insecurity    Worry: Not on file    Inability: Not on file   Transportation needs    Medical: Not on file    Non-medical: Not on file  Tobacco Use   Smoking status: Former Smoker    Packs/day: 1.00    Years: 30.00    Pack years: 30.00    Types: Cigarettes    Quit date: 01/20/2015    Years since quitting: 4.1   Smokeless tobacco: Never Used  Substance and Sexual Activity   Alcohol use: No    Alcohol/week: 0.0 standard drinks   Drug use: No   Sexual activity: Not on file  Lifestyle   Physical activity    Days per week: Not on file    Minutes per session: Not on file   Stress: Not on file  Relationships   Social connections    Talks on phone: Not on file    Gets together: Not on file    Attends religious service: Not on file    Active member of club or organization: Not on file    Attends meetings of clubs or organizations: Not on file    Relationship status: Not on file   Intimate partner violence    Fear of current or ex partner: Not on file    Emotionally abused: Not on file    Physically abused: Not on file    Forced sexual activity: Not on file  Other Topics Concern   Not on file  Social History Narrative   Drinks 2-3 caffeine drinks a day       PHYSICAL EXAM  Vitals:   03/09/19 1034  BP: 130/76  Pulse: 77  Temp: (!) 97.3 F (36.3  C)  TempSrc: Oral  Weight: 222 lb (100.7 kg)  Height: _0  (1.905 m)   Body mass index is 27.75 kg/m.  Generalized: Well developed, in no acute distress  Chest: Lungs clear to auscultation bilaterally Neurological examination  Mentation: Alert oriented to time, place, history taking. Follows all commands speech and language fluent Cranial nerve II-XII:Extraocular movements were full, visual field were full on confrontational test. Head turning and shoulder shrug  were normal and symmetric. Motor: The motor testing reveals 5 over 5 strength of all 4 extremities. Good symmetric motor tone is noted throughout.  Sensory: Sensory testing is intact to soft touch on all 4 extremities. No evidence of extinction is noted.  Coordination: Cerebellar testing reveals good finger-nose-finger and heel-to-shin bilaterally.  Gait and station: Gait is normal.     DIAGNOSTIC DATA (LABS, IMAGING, TESTING) - I reviewed patient records, labs, notes, testing and imaging myself where available.  Lab Results  Component Value Date   WBC 5.1 06/02/2018   HGB 11.1 (L) 06/02/2018   HCT 38.8 (L) 06/02/2018   MCV 87.0 06/02/2018   PLT 222 06/02/2018      Component Value Date/Time   NA 139 06/01/2018 0407   K 4.1 06/01/2018 0407   CL 103 06/01/2018 0407   CO2 30 06/01/2018 0407   GLUCOSE 108 (H) 06/01/2018 0407   BUN 11 06/01/2018 0407   CREATININE 0.99 06/01/2018 0407   CREATININE 1.23 04/10/2011 1307   CALCIUM 8.3 (L) 06/01/2018 0407   PROT 6.0 (L) 01/02/2018 1639   ALBUMIN 3.1 (L) 01/02/2018 1639   AST 14 (L) 01/02/2018 1639   ALT 15 (L) 01/02/2018 1639   ALKPHOS 78 01/02/2018 1639   BILITOT 1.0 01/02/2018 1639   GFRNONAA >60 06/01/2018 0407  GFRAA >60 06/01/2018 0407   No results found for: CHOL, HDL, LDLCALC, LDLDIRECT, TRIG, CHOLHDL Lab Results  Component Value Date   HGBA1C 6.5 (H) 05/21/2018   Lab Results  Component Value Date   VITAMINB12 127 (L) 12/19/2017   No results found  for: TSH    ASSESSMENT AND PLAN 73 y.o. year old male  has a past medical history of AAA (abdominal aortic aneurysm) ([place]), Acute GI bleeding (12/19/2017), Allergic rhinitis, seasonal, Anemia secondary to renal failure, Anticoagulant long-term use, Arthritis, B12 deficiency, BPH (benign prostatic hyperplasia), CKD (chronic kidney disease), stage III ([place]), Colonic stricture ([place]) (12/13/2017), Colostomy present ([place]), Congenital absence of left kidney (followed by nephrologist at [facility]--- dr detarding), Duodenal ulcer (12/20/2017), Dyslipidemia, Esophageal candidiasis ([place]) (12/19/2017), Gastric ulcer without hemorrhage or perforation (12/20/2017), History of bowel resection (12/13/2017), Hypercholesteremia (09/19/2018), Hyperparathyroidism, secondary renal ([place]), Hypertension, Left carotid bruit, OSA on CPAP, Permanent atrial fibrillation (2017), Solitary right kidney, Type 2 diabetes mellitus ([place]), Wears glasses, and Wears partial dentures. here with:  1.  Obstructive sleep apnea on CPAP  The patient's CPAP download shows excellent compliance and good treatment of his apnea.  He is encouraged to continue using CPAP nightly and greater than 4 hours each night.  He is advised that if his symptoms worsen or he develops new symptoms he should let us know.  He will follow-up in 1 year or sooner if needed.  I spent 15 minutes with the patient. 50% of this time was spent reviewing CPAP download   Ward Givens, MSN, NP-C 03/09/2019, 10:56 AM [facility] [address], [place], [place] [address] [phone]  I reviewed the above note and documentation by the Nurse Practitioner and agree with the history, exam, assessment and plan as outlined above. I was immediately available for consultation. Star Age, MD, PhD [facility])

## 2019-03-25 DIAGNOSIS — Z23 Encounter for immunization: Secondary | ICD-10-CM | POA: Diagnosis not present

## 2019-04-09 DIAGNOSIS — E7800 Pure hypercholesterolemia, unspecified: Secondary | ICD-10-CM | POA: Diagnosis not present

## 2019-04-09 DIAGNOSIS — E119 Type 2 diabetes mellitus without complications: Secondary | ICD-10-CM | POA: Diagnosis not present

## 2019-04-09 DIAGNOSIS — M109 Gout, unspecified: Secondary | ICD-10-CM | POA: Diagnosis not present

## 2019-04-09 DIAGNOSIS — I10 Essential (primary) hypertension: Secondary | ICD-10-CM | POA: Diagnosis not present

## 2019-04-30 DIAGNOSIS — H5200 Hypermetropia, unspecified eye: Secondary | ICD-10-CM | POA: Diagnosis not present

## 2019-04-30 DIAGNOSIS — E109 Type 1 diabetes mellitus without complications: Secondary | ICD-10-CM | POA: Diagnosis not present

## 2019-04-30 DIAGNOSIS — Z0100 Encounter for examination of eyes and vision without abnormal findings: Secondary | ICD-10-CM | POA: Diagnosis not present

## 2019-04-30 DIAGNOSIS — H2512 Age-related nuclear cataract, left eye: Secondary | ICD-10-CM | POA: Diagnosis not present

## 2019-04-30 DIAGNOSIS — I10 Essential (primary) hypertension: Secondary | ICD-10-CM | POA: Diagnosis not present

## 2019-04-30 DIAGNOSIS — H26491 Other secondary cataract, right eye: Secondary | ICD-10-CM | POA: Diagnosis not present

## 2019-04-30 DIAGNOSIS — E7800 Pure hypercholesterolemia, unspecified: Secondary | ICD-10-CM | POA: Diagnosis not present

## 2019-04-30 DIAGNOSIS — Z961 Presence of intraocular lens: Secondary | ICD-10-CM | POA: Diagnosis not present

## 2019-05-29 DIAGNOSIS — G4733 Obstructive sleep apnea (adult) (pediatric): Secondary | ICD-10-CM | POA: Diagnosis not present

## 2019-06-13 DIAGNOSIS — Z03818 Encounter for observation for suspected exposure to other biological agents ruled out: Secondary | ICD-10-CM | POA: Diagnosis not present

## 2019-07-08 DIAGNOSIS — E785 Hyperlipidemia, unspecified: Secondary | ICD-10-CM | POA: Diagnosis not present

## 2019-07-08 DIAGNOSIS — N400 Enlarged prostate without lower urinary tract symptoms: Secondary | ICD-10-CM | POA: Diagnosis not present

## 2019-07-08 DIAGNOSIS — M109 Gout, unspecified: Secondary | ICD-10-CM | POA: Diagnosis not present

## 2019-07-08 DIAGNOSIS — Z1159 Encounter for screening for other viral diseases: Secondary | ICD-10-CM | POA: Diagnosis not present

## 2019-07-08 DIAGNOSIS — R809 Proteinuria, unspecified: Secondary | ICD-10-CM | POA: Diagnosis not present

## 2019-07-08 DIAGNOSIS — I4891 Unspecified atrial fibrillation: Secondary | ICD-10-CM | POA: Diagnosis not present

## 2019-07-08 DIAGNOSIS — E1122 Type 2 diabetes mellitus with diabetic chronic kidney disease: Secondary | ICD-10-CM | POA: Diagnosis not present

## 2019-07-08 DIAGNOSIS — N182 Chronic kidney disease, stage 2 (mild): Secondary | ICD-10-CM | POA: Diagnosis not present

## 2019-07-08 DIAGNOSIS — I10 Essential (primary) hypertension: Secondary | ICD-10-CM | POA: Diagnosis not present

## 2019-07-08 DIAGNOSIS — N2581 Secondary hyperparathyroidism of renal origin: Secondary | ICD-10-CM | POA: Diagnosis not present

## 2019-07-08 DIAGNOSIS — N189 Chronic kidney disease, unspecified: Secondary | ICD-10-CM | POA: Diagnosis not present

## 2019-07-08 DIAGNOSIS — I129 Hypertensive chronic kidney disease with stage 1 through stage 4 chronic kidney disease, or unspecified chronic kidney disease: Secondary | ICD-10-CM | POA: Diagnosis not present

## 2019-07-08 DIAGNOSIS — D631 Anemia in chronic kidney disease: Secondary | ICD-10-CM | POA: Diagnosis not present

## 2019-07-08 DIAGNOSIS — E119 Type 2 diabetes mellitus without complications: Secondary | ICD-10-CM | POA: Diagnosis not present

## 2019-07-11 DIAGNOSIS — Z03818 Encounter for observation for suspected exposure to other biological agents ruled out: Secondary | ICD-10-CM | POA: Diagnosis not present

## 2019-07-20 DIAGNOSIS — Z03818 Encounter for observation for suspected exposure to other biological agents ruled out: Secondary | ICD-10-CM | POA: Diagnosis not present

## 2019-08-04 ENCOUNTER — Encounter: Payer: Self-pay | Admitting: Cardiology

## 2019-08-04 DIAGNOSIS — E119 Type 2 diabetes mellitus without complications: Secondary | ICD-10-CM | POA: Diagnosis not present

## 2019-08-04 DIAGNOSIS — E7800 Pure hypercholesterolemia, unspecified: Secondary | ICD-10-CM | POA: Diagnosis not present

## 2019-08-04 DIAGNOSIS — I10 Essential (primary) hypertension: Secondary | ICD-10-CM | POA: Diagnosis not present

## 2019-08-13 DIAGNOSIS — E119 Type 2 diabetes mellitus without complications: Secondary | ICD-10-CM | POA: Diagnosis not present

## 2019-08-13 DIAGNOSIS — I4891 Unspecified atrial fibrillation: Secondary | ICD-10-CM | POA: Diagnosis not present

## 2019-08-13 DIAGNOSIS — I10 Essential (primary) hypertension: Secondary | ICD-10-CM | POA: Diagnosis not present

## 2019-08-13 DIAGNOSIS — D6869 Other thrombophilia: Secondary | ICD-10-CM | POA: Diagnosis not present

## 2019-08-13 DIAGNOSIS — Z7901 Long term (current) use of anticoagulants: Secondary | ICD-10-CM | POA: Diagnosis not present

## 2019-08-13 DIAGNOSIS — Z809 Family history of malignant neoplasm, unspecified: Secondary | ICD-10-CM | POA: Diagnosis not present

## 2019-08-13 DIAGNOSIS — R69 Illness, unspecified: Secondary | ICD-10-CM | POA: Diagnosis not present

## 2019-08-13 DIAGNOSIS — E785 Hyperlipidemia, unspecified: Secondary | ICD-10-CM | POA: Diagnosis not present

## 2019-08-13 DIAGNOSIS — Z7984 Long term (current) use of oral hypoglycemic drugs: Secondary | ICD-10-CM | POA: Diagnosis not present

## 2019-08-13 DIAGNOSIS — Z20822 Contact with and (suspected) exposure to covid-19: Secondary | ICD-10-CM | POA: Diagnosis not present

## 2019-09-17 ENCOUNTER — Other Ambulatory Visit: Payer: Self-pay

## 2019-09-17 MED ORDER — APIXABAN 5 MG PO TABS: 5.0000 mg | ORAL_TABLET | Freq: Two times a day (BID) | ORAL | 0 refills | Status: DC

## 2019-09-20 NOTE — Progress Notes (Signed)
Primary Physician/Referring:  Jani Gravel, MD  Patient ID: [name], male    DOB: 1946/07/16, 74 y.o.   MRN: [id number]  No chief complaint on file.  HPI:    SHONN FARRUGGIA  is a 74 y.o. AAM patient with permanent atrial fibrillation, hypertension, diabetes mellitus which is well controlled presents here for followup. He quit smoking in 2016.   Sleep study in 2016 revealed severe obstructive sleep apnea.  He received his CPAP machine in January 2017 and has been using this regularly and has been compliant.  He had colonic stricture and pneumatosis leading to obstruction on 12/13/2017 and had colostomy which was reversed.  He is presently doing well and has not had any chest pain or shortness of breath, tolerating anticoagulation well.  He has mild discomfort at the surgical site that is chronic.   Past Medical History:  Diagnosis Date  . AAA (abdominal aortic aneurysm) ([place])    per cardiologist note dated 03-14-2018 last duplex , 30-51m  (dr gEinar Gip  . Acute GI bleeding 12/19/2017  . Allergic rhinitis, seasonal   . Anemia secondary to renal failure   . Anticoagulant long-term use    ELIQUIS  . Arthritis    LOWER BACK  . B12 deficiency   . BPH (benign prostatic hyperplasia)   . CKD (chronic kidney disease), stage III ([place]   . Colonic stricture ([place] 12/13/2017  . Colostomy present ([place]   . Congenital absence of left kidney [facility]   per CT 12-11-2017 and 01-02-2018  . Duodenal ulcer 12/20/2017  . Dyslipidemia   . Esophageal candidiasis ([place] 12/19/2017  . Gastric ulcer without hemorrhage or perforation 12/20/2017  . History of bowel resection 12/13/2017   for sigmoid stricture-- emergency sigmoidectomy  . Hypercholesteremia 09/19/2018  . Hyperparathyroidism, secondary renal ([place]   . Hypertension   . Left carotid bruit   . OSA on CPAP    [facility]  . Permanent atrial fibrillation 2017    cardiologist-  dr gEinar Gip . Solitary right kidney   . Type 2 diabetes mellitus ([place]    followed by pcp  . Wears glasses   . Wears partial dentures    UPPER AND LOWER   Past Surgical History:  Procedure Laterality Date  . BIOPSY  12/19/2017   Procedure: BIOPSY;  Surgeon: HCarol Ada MD;  [facility];  Service: Endoscopy;;  . CARDIOVASCULAR STRESS TEST  11/16/2011   low risk nuclear study w/ no ischemia/  normal LV function and wall motion , ef 60%  . CARDIOVERSION  12/18/2011   Procedure: CARDIOVERSION;  Surgeon: JLaverda Page MD;  [facility]  Service: Cardiovascular;  Laterality: N/A;  . CATARACT EXTRACTION W/ INTRAOCULAR LENS IMPLANT Right 2015  . COLON RESECTION N/A 12/13/2017   Procedure: LAPAROSCOPIC END COLOSTOMY HARTMAN'S PROCEDURE;  Surgeon: TLeighton Ruff MD;  [facility];  Service: General;  Laterality: N/A;  . COLOSTOMY TAKEDOWN N/A 05/29/2018   Procedure: LAPAROSCOPIC COLOSTOMY REVERSAL ERAS PATHWAY, RIGID PROCTOSCOPY;  Surgeon: TLeighton Ruff MD;  [facility];  Service: General;  Laterality: N/A;  . CYST REMOVED FROM UPPER BACK  09/2017   BENIGN  . ESOPHAGOGASTRODUODENOSCOPY N/A 12/19/2017   Procedure: ESOPHAGOGASTRODUODENOSCOPY (EGD);  Surgeon: HCarol Ada MD;  [facility];  Service: Endoscopy;  Laterality: N/A;  . FLEXIBLE SIGMOIDOSCOPY N/A 12/13/2017   Procedure: FLEXIBLE SIGMOIDOSCOPY;  Surgeon: HCarol Ada MD;  [facility];  Service: Endoscopy;  Laterality: N/A;  . INGUINAL HERNIA REPAIR Left 1980s   AND REMOVAL CYST ON UPPER BACK  . PAROTIDECTOMY Right 09-21-1999   dr Janace Hoard _0    salivery gland mass  (benign warthin's tumor)  . TRANSTHORACIC ECHOCARDIOGRAM  01/04/2018   mild LVH, ef 27-03%, grade 2 diastolic function/  trivial AR and MR/  mild LAE/  mild to moderate TR   Family History  Problem Relation Age of Onset  . Cancer Father   . Alzheimer's disease Father   . Cancer Mother   . Kidney disease  Paternal Aunt     Social History   Tobacco Use  . Smoking status: Former Smoker    Packs/day: 1.00    Years: 30.00    Pack years: 30.00    Types: Cigarettes    Quit date: 01/20/2015    Years since quitting: 4.6  . Smokeless tobacco: Never Used  Substance Use Topics  . Alcohol use: No    Alcohol/week: 0.0 standard drinks   ROS  Review of Systems  Cardiovascular: Positive for leg swelling (left leg). Negative for chest pain and dyspnea on exertion.  Gastrointestinal: Negative for melena.   Objective  There were no vitals taken for this visit.  Vitals with BMI 03/09/2019 09/19/2018 06/02/2018  Height _1  _2  -  Weight 222 lbs 210 lbs -  BMI 50.09 38.18 -  Systolic 299 371 696  Diastolic 76 84 89  Pulse 77 71 72     Physical Exam  HENT:  Head: Atraumatic.  Cardiovascular: An irregularly irregular rhythm present. Exam reveals no gallop, no S3 and no S4.  No murmur heard. Pulses:      Dorsalis pedis pulses are 2+ on the right side and 1+ on the left side.       Posterior tibial pulses are 1+ on the right side and 0 on the left side.  S1 is variable, S2 is normal. No JVD, 1-2+ bilateral ankle edema.  Pulmonary/Chest: Effort normal and breath sounds normal.  Abdominal: Soft. Bowel sounds are normal.   Laboratory examination:   No results for input(s): NA, K, CL, CO2, GLUCOSE, BUN, CREATININE, CALCIUM, GFRNONAA, GFRAA in the last 8760 hours. CrCl cannot be calculated (Patient's most recent lab result is older than the maximum 21 days allowed.).  CMP Latest Ref Rng & Units 06/01/2018 05/31/2018 05/30/2018  Glucose 70 - 99 mg/dL 108(H) 82 162(H)  BUN 8 - 23 mg/dL _3 Creatinine 0.61 - 1.24 mg/dL 0.99 1.05 1.22  Sodium 135 - 145 mmol/L 139 140 140  Potassium 3.5 - 5.1 mmol/L 4.1 4.2 5.3(H)  Chloride 98 - 111 mmol/L 103 105 104  CO2 22 - 32 mmol/L _4 Calcium 8.9 - 10.3 mg/dL 8.3(L) 8.4(L) 8.4(L)  Total Protein 6.5 - 8.1 g/dL - - -  Total Bilirubin 0.3 - 1.2  mg/dL - - -  Alkaline Phos 38 - 126 U/L - - -  AST 15 - 41 U/L - - -  ALT 17 - 63 U/L - - -   CBC Latest Ref Rng & Units 06/02/2018 06/01/2018 05/31/2018  WBC 4.0 - 10.5 K/uL 5.1 5.5 5.1  Hemoglobin 13.0 - 17.0 g/dL 11.1(L) 11.1(L) 11.4(L)  Hematocrit 39.0 - 52.0 % 38.8(L) 38.2(L) 40.1  Platelets 150 - 400 K/uL 222 193 209   Lipid Panel  No results found for: CHOL, TRIG, HDL, CHOLHDL, VLDL, LDLCALC, LDLDIRECT HEMOGLOBIN A1C Lab Results  Component Value Date   HGBA1C 6.5 (  H) 05/21/2018   MPG 139.85 05/21/2018   TSH No results for input(s): TSH in the last 8760 hours.  External labs:  Labs 07/08/2019: Hb 15.3/HCT 48.6, platelets 174. Serum glucose 89 mg, BUN 14, creatinine 1.2, EGFR >60 mL, CMP normal. A1c 6.5%. Total cholesterol 115, triglycerides 48, HDL 47, LDL 58. Uric acid 6.5.  Medications and allergies   Allergies  Allergen Reactions  . Sulfa Antibiotics Shortness Of Breath    Headaches      Current Outpatient Medications  Medication Instructions  . acetaminophen (TYLENOL) 500 mg, Oral, Every 6 hours PRN  . apixaban (ELIQUIS) 5 mg, Oral, 2 times daily  . budesonide-formoterol (SYMBICORT) 160-4.5 MCG/ACT inhaler 2 puffs, Inhalation, 2 times daily  . cyanocobalamin ((VITAMIN B-12)) 1,000 mcg, Intramuscular, Daily, Take 1000 MCG's IM daily x4 days, then 1000 MCG's IM weekly x1 month, then 1000 MCG's IM monthly.  . diltiazem (CARDIZEM CD) 120 mg, Oral, 2 times daily  . doxazosin (CARDURA) 8 mg, Oral, Daily at bedtime  . ferrous sulfate 325 mg, Oral, 2 times daily with meals  . furosemide (LASIX) 40 mg, Oral,  Every morning - 10a  . glimepiride (AMARYL) 1 mg, Oral, Daily with breakfast  . lisinopril (ZESTRIL) 20 mg, Oral, Daily at bedtime  . metFORMIN (GLUCOPHAGE) 500 mg, Oral, 2 times daily with meals  . pravastatin (PRAVACHOL) 20 mg, Oral, Every evening  . Syringe/Needle, Disp, (SYRINGE 3CC/22GX1-1/2") 22G X 1-1/2" 3 ML MISC 1,000 mcg, Does not apply, Daily  .  tiotropium (SPIRIVA HANDIHALER) 18 mcg, Inhalation, Daily    Radiology:   No AAA by CTA abdomen in 2019, cancel US abdomen.  Cardiac Studies:   Exercise sestamibi 11/16/11: Gut uptake artifact. No ischemia. EF 60%.Low risk. 7.3 METs. Symptoms included Dyspnea.   Echocardiogram 01/04/2018: - Left ventricle: The cavity size was normal. Wall thickness was  increased in a pattern of mild LVH. Systolic function was normal.  The estimated ejection fraction was in the range of 50% to 55%.  Wall motion was normal; there were no regional wall motion  abnormalities. Features are consistent with a pseudonormal left  ventricular filling pattern, with concomitant abnormal relaxation  and increased filling pressure (grade 2 diastolic dysfunction).  -Aortic valve: Trace aortic regurgitation. -Left atrium is mildly dilated.  EKG 09/21/2019: Atrial fibrillation with controlled ventricular response at the rate of 71 bpm, normal axis, poor R wave progression, cannot exclude anteroseptal infarct old.  No evidence of ischemia, normal QT interval.  Low-voltage complexes.    Assessment     ICD-10-CM   1. Permanent atrial fibrillation ([place]). CHAD2DS2-VASc Score of 3 with anual risk of stroke of 3.2% (A HTN, DM)  I48.21   2. Essential hypertension  I10   3. Hypercholesteremia  E78.00   4. OSA (obstructive sleep apnea)  G47.33       No orders of the defined types were placed in this encounter.   There are no discontinued medications.   Recommendations:   AVRAM DANIELSON  is a 74 y.o. AAM patient with permanent atrial fibrillation, hypertension, diabetes mellitus which is well controlled presents here for followup. He quit smoking in 2016.   Sleep study in 2016 revealed severe obstructive sleep apnea.  He received his CPAP machine in January 2017 and has been using this regularly and has been compliant.  He is presently doing well and essentially asymptomatic, atrial fibrillation is rate  controlled.  He is tolerating anticoagulation.  I reviewed his external labs, blood pressure is  well controlled, diabetes is well controlled and hemoglobin is remained stable.  No changes in the medications were done by me today, I will see him back in 1 year or sooner if problems.  Adrian Prows, MD, [facility] 09/20/2019, 4:16 PM [facility]. [place] Office: [phone]

## 2019-09-21 ENCOUNTER — Ambulatory Visit: Payer: Medicare HMO | Admitting: Cardiology

## 2019-09-21 ENCOUNTER — Other Ambulatory Visit: Payer: Self-pay

## 2019-09-21 ENCOUNTER — Encounter: Payer: Self-pay | Admitting: Cardiology

## 2019-09-21 VITALS — BP 141/93 | HR 80 | Temp 98.6°F | Resp 16 | Ht 75.0 in | Wt 229.9 lb

## 2019-09-21 DIAGNOSIS — I4821 Permanent atrial fibrillation: Secondary | ICD-10-CM | POA: Diagnosis not present

## 2019-09-21 DIAGNOSIS — G4733 Obstructive sleep apnea (adult) (pediatric): Secondary | ICD-10-CM | POA: Diagnosis not present

## 2019-09-21 DIAGNOSIS — I10 Essential (primary) hypertension: Secondary | ICD-10-CM

## 2019-09-21 DIAGNOSIS — E7800 Pure hypercholesterolemia, unspecified: Secondary | ICD-10-CM | POA: Diagnosis not present

## 2019-10-27 DIAGNOSIS — E119 Type 2 diabetes mellitus without complications: Secondary | ICD-10-CM | POA: Diagnosis not present

## 2019-10-27 DIAGNOSIS — I10 Essential (primary) hypertension: Secondary | ICD-10-CM | POA: Diagnosis not present

## 2019-11-25 DIAGNOSIS — E7800 Pure hypercholesterolemia, unspecified: Secondary | ICD-10-CM | POA: Diagnosis not present

## 2019-11-25 DIAGNOSIS — E119 Type 2 diabetes mellitus without complications: Secondary | ICD-10-CM | POA: Diagnosis not present

## 2019-11-25 DIAGNOSIS — M5442 Lumbago with sciatica, left side: Secondary | ICD-10-CM | POA: Diagnosis not present

## 2019-11-25 DIAGNOSIS — I10 Essential (primary) hypertension: Secondary | ICD-10-CM | POA: Diagnosis not present

## 2019-11-25 DIAGNOSIS — M109 Gout, unspecified: Secondary | ICD-10-CM | POA: Diagnosis not present

## 2019-12-10 ENCOUNTER — Other Ambulatory Visit: Payer: Self-pay | Admitting: Cardiology

## 2019-12-16 DIAGNOSIS — E1121 Type 2 diabetes mellitus with diabetic nephropathy: Secondary | ICD-10-CM | POA: Diagnosis not present

## 2019-12-16 DIAGNOSIS — E785 Hyperlipidemia, unspecified: Secondary | ICD-10-CM | POA: Diagnosis not present

## 2019-12-16 DIAGNOSIS — E119 Type 2 diabetes mellitus without complications: Secondary | ICD-10-CM | POA: Diagnosis not present

## 2019-12-16 DIAGNOSIS — Z6828 Body mass index (BMI) 28.0-28.9, adult: Secondary | ICD-10-CM | POA: Diagnosis not present

## 2019-12-16 DIAGNOSIS — R809 Proteinuria, unspecified: Secondary | ICD-10-CM | POA: Diagnosis not present

## 2019-12-16 DIAGNOSIS — I10 Essential (primary) hypertension: Secondary | ICD-10-CM | POA: Diagnosis not present

## 2019-12-30 DIAGNOSIS — I10 Essential (primary) hypertension: Secondary | ICD-10-CM | POA: Diagnosis not present

## 2019-12-30 DIAGNOSIS — Z6828 Body mass index (BMI) 28.0-28.9, adult: Secondary | ICD-10-CM | POA: Diagnosis not present

## 2019-12-30 DIAGNOSIS — E119 Type 2 diabetes mellitus without complications: Secondary | ICD-10-CM | POA: Diagnosis not present

## 2019-12-30 DIAGNOSIS — E785 Hyperlipidemia, unspecified: Secondary | ICD-10-CM | POA: Diagnosis not present

## 2019-12-30 DIAGNOSIS — R809 Proteinuria, unspecified: Secondary | ICD-10-CM | POA: Diagnosis not present

## 2019-12-30 DIAGNOSIS — E1121 Type 2 diabetes mellitus with diabetic nephropathy: Secondary | ICD-10-CM | POA: Diagnosis not present

## 2020-01-20 DIAGNOSIS — D631 Anemia in chronic kidney disease: Secondary | ICD-10-CM | POA: Diagnosis not present

## 2020-01-20 DIAGNOSIS — N400 Enlarged prostate without lower urinary tract symptoms: Secondary | ICD-10-CM | POA: Diagnosis not present

## 2020-01-20 DIAGNOSIS — I129 Hypertensive chronic kidney disease with stage 1 through stage 4 chronic kidney disease, or unspecified chronic kidney disease: Secondary | ICD-10-CM | POA: Diagnosis not present

## 2020-01-20 DIAGNOSIS — D529 Folate deficiency anemia, unspecified: Secondary | ICD-10-CM | POA: Diagnosis not present

## 2020-01-20 DIAGNOSIS — N189 Chronic kidney disease, unspecified: Secondary | ICD-10-CM | POA: Diagnosis not present

## 2020-01-20 DIAGNOSIS — N182 Chronic kidney disease, stage 2 (mild): Secondary | ICD-10-CM | POA: Diagnosis not present

## 2020-01-20 DIAGNOSIS — R809 Proteinuria, unspecified: Secondary | ICD-10-CM | POA: Diagnosis not present

## 2020-01-20 DIAGNOSIS — D519 Vitamin B12 deficiency anemia, unspecified: Secondary | ICD-10-CM | POA: Diagnosis not present

## 2020-01-20 DIAGNOSIS — N2581 Secondary hyperparathyroidism of renal origin: Secondary | ICD-10-CM | POA: Diagnosis not present

## 2020-01-20 DIAGNOSIS — E785 Hyperlipidemia, unspecified: Secondary | ICD-10-CM | POA: Diagnosis not present

## 2020-01-20 DIAGNOSIS — E1122 Type 2 diabetes mellitus with diabetic chronic kidney disease: Secondary | ICD-10-CM | POA: Diagnosis not present

## 2020-03-06 ENCOUNTER — Other Ambulatory Visit: Payer: Self-pay | Admitting: Cardiology

## 2020-03-09 ENCOUNTER — Ambulatory Visit: Payer: Medicare HMO | Admitting: Adult Health

## 2020-03-31 DIAGNOSIS — E1121 Type 2 diabetes mellitus with diabetic nephropathy: Secondary | ICD-10-CM | POA: Diagnosis not present

## 2020-03-31 DIAGNOSIS — E785 Hyperlipidemia, unspecified: Secondary | ICD-10-CM | POA: Diagnosis not present

## 2020-03-31 DIAGNOSIS — I10 Essential (primary) hypertension: Secondary | ICD-10-CM | POA: Diagnosis not present

## 2020-03-31 DIAGNOSIS — E119 Type 2 diabetes mellitus without complications: Secondary | ICD-10-CM | POA: Diagnosis not present

## 2020-03-31 DIAGNOSIS — R809 Proteinuria, unspecified: Secondary | ICD-10-CM | POA: Diagnosis not present

## 2020-03-31 DIAGNOSIS — Z6828 Body mass index (BMI) 28.0-28.9, adult: Secondary | ICD-10-CM | POA: Diagnosis not present

## 2020-04-27 DIAGNOSIS — G4733 Obstructive sleep apnea (adult) (pediatric): Secondary | ICD-10-CM | POA: Diagnosis not present

## 2020-05-20 DIAGNOSIS — E119 Type 2 diabetes mellitus without complications: Secondary | ICD-10-CM | POA: Diagnosis not present

## 2020-05-20 DIAGNOSIS — I10 Essential (primary) hypertension: Secondary | ICD-10-CM | POA: Diagnosis not present

## 2020-05-20 DIAGNOSIS — M109 Gout, unspecified: Secondary | ICD-10-CM | POA: Diagnosis not present

## 2020-05-20 DIAGNOSIS — E7800 Pure hypercholesterolemia, unspecified: Secondary | ICD-10-CM | POA: Diagnosis not present

## 2020-05-20 DIAGNOSIS — Z125 Encounter for screening for malignant neoplasm of prostate: Secondary | ICD-10-CM | POA: Diagnosis not present

## 2020-05-24 ENCOUNTER — Encounter: Payer: Self-pay | Admitting: Adult Health

## 2020-05-24 ENCOUNTER — Ambulatory Visit: Payer: Medicare HMO | Admitting: Adult Health

## 2020-05-24 VITALS — BP 128/80 | Ht 75.0 in | Wt 221.4 lb

## 2020-05-24 DIAGNOSIS — G4733 Obstructive sleep apnea (adult) (pediatric): Secondary | ICD-10-CM

## 2020-05-24 DIAGNOSIS — Z9989 Dependence on other enabling machines and devices: Secondary | ICD-10-CM | POA: Diagnosis not present

## 2020-05-24 NOTE — Progress Notes (Signed)
PATIENT: Manuel [name] DOB: Sep 18, 1945  REASON FOR VISIT: follow up HISTORY FROM: patient  HISTORY OF PRESENT ILLNESS: Today 05/24/20:  Manuel [name] is a 74 year old male with a history of obstructive sleep apnea on CPAP.  He returns today for follow-up.  Unfortunately we were unable to obtain a download from [employer] today we have called them twice but still have not received the fax.  The patient states that he uses CPAP nightly.  Reports that last year during the last 1 he went without the CPAP and he can tell the difference.  He denies any new issues.  Returns today for an evaluation.  HISTORY 03/09/19:  Manuel [name] is a 74 year old male with a history of obstructive sleep apnea on CPAP.  He returns today for follow-up.  His download indicates that he uses machine nightly for compliance of 100%.  He uses machine greater than 4 hours each night.  On average he uses his machine 7 hours and 52 minutes.  His residual AHI is 2.8 on 8 cm of water with EPR of 2.  His leak in the 95th percentile is 29.4 L/min.  Overall he reports that he is doing well.  Denies any new issues.  REVIEW OF SYSTEMS: Out of a complete 14 system review of symptoms, the patient complains only of the following symptoms, and all other reviewed systems are negative.  FSS 21 ESS 8  ALLERGIES: Allergies  Allergen Reactions  . Sulfa Antibiotics Shortness Of Breath    Headaches     HOME MEDICATIONS: Outpatient Medications Prior to Visit  Medication Sig Dispense Refill  . acetaminophen (TYLENOL) 500 MG tablet Take 500 mg by mouth every 6 (six) hours as needed for moderate pain.    . cyanocobalamin (,VITAMIN B-12,) 1000 MCG/ML injection Inject 1 mL (1,000 mcg total) into the muscle daily. Take 1000 MCG's IM daily x4 days, then 1000 MCG's IM weekly x1 month, then 1000 MCG's IM monthly. (Patient taking differently: Inject 1,000 mcg into the muscle every 30 (thirty) days. Take 1000 MCG's IM daily x4 days, then 1000  MCG's IM weekly x1 month, then 1000 MCG's IM monthly.) 25 mL 0  . diltiazem (CARDIZEM CD) 120 MG 24 hr capsule Take 120 mg by mouth 2 (two) times daily.   5  . doxazosin (CARDURA) 8 MG tablet Take 8 mg by mouth at bedtime.     Marland Kitchen ELIQUIS 5 MG TABS tablet TAKE 1 TABLET BY MOUTH TWICE A DAY 180 tablet 3  . ferrous sulfate 325 (65 FE) MG tablet Take 325 mg by mouth 2 (two) times daily with a meal.    . furosemide (LASIX) 40 MG tablet Take 40 mg by mouth every morning.    Marland Kitchen glimepiride (AMARYL) 1 MG tablet Take 1 mg by mouth daily with breakfast.    . lisinopril (PRINIVIL,ZESTRIL) 20 MG tablet Take 20 mg by mouth at bedtime.     . metFORMIN (GLUCOPHAGE) 500 MG tablet Take 500 mg by mouth 2 (two) times daily with a meal.   5  . pravastatin (PRAVACHOL) 20 MG tablet Take 20 mg by mouth every evening.      No facility-administered medications prior to visit.    PAST MEDICAL HISTORY: Past Medical History:  Diagnosis Date  . AAA (abdominal aortic aneurysm) ([place])    per cardiologist note dated 03-14-2018 last duplex , 30-56m  (dr gEinar Gip  . Acute GI bleeding 12/19/2017  . Allergic rhinitis, seasonal   . Anemia secondary to  renal failure   . Anticoagulant long-term use    ELIQUIS  . Arthritis    LOWER BACK  . B12 deficiency   . BPH (benign prostatic hyperplasia)   . CKD (chronic kidney disease), stage III ([place])   . Colonic stricture ([place]) 12/13/2017  . Colostomy present ([place])   . Congenital absence of left kidney followed by nephrologist at [facility]--- dr detarding   per CT 12-11-2017 and 01-02-2018  . Duodenal ulcer 12/20/2017  . Dyslipidemia   . Esophageal candidiasis ([place]) 12/19/2017  . Gastric ulcer without hemorrhage or perforation 12/20/2017  . History of bowel resection 12/13/2017   for sigmoid stricture-- emergency sigmoidectomy  . Hypercholesteremia 09/19/2018  . Hyperparathyroidism, secondary renal ([place])   . Hypertension   . Left carotid bruit   . OSA on CPAP     followed by [facility]  . Permanent atrial fibrillation [facility]) 2017   cardiologist-  dr Einar Gip  . Solitary right kidney   . Type 2 diabetes mellitus ([place])    followed by pcp  . Wears glasses   . Wears partial dentures    UPPER AND LOWER    PAST SURGICAL HISTORY: Past Surgical History:  Procedure Laterality Date  . BIOPSY  12/19/2017   Procedure: BIOPSY;  Surgeon: Carol Ada, MD;  [facility];  Service: Endoscopy;;  . CARDIOVASCULAR STRESS TEST  11/16/2011   low risk nuclear study w/ no ischemia/  normal LV function and wall motion , ef 60%  . CARDIOVERSION  12/18/2011   Procedure: CARDIOVERSION;  Surgeon: Laverda Page, MD;  Location: [place];  Service: Cardiovascular;  Laterality: N/A;  . CATARACT EXTRACTION W/ INTRAOCULAR LENS IMPLANT Right 2015  . COLON RESECTION N/A 12/13/2017   Procedure: LAPAROSCOPIC END COLOSTOMY HARTMAN'S PROCEDURE;  Surgeon: Leighton Ruff, MD;  [facility];  Service: General;  Laterality: N/A;  . COLOSTOMY TAKEDOWN N/A 05/29/2018   Procedure: LAPAROSCOPIC COLOSTOMY REVERSAL ERAS PATHWAY, RIGID PROCTOSCOPY;  Surgeon: Leighton Ruff, MD;  [facility];  Service: General;  Laterality: N/A;  . CYST REMOVED FROM UPPER BACK  09/2017   BENIGN  . ESOPHAGOGASTRODUODENOSCOPY N/A 12/19/2017   Procedure: ESOPHAGOGASTRODUODENOSCOPY (EGD);  Surgeon: Carol Ada, MD;  Location: [facility];  Service: Endoscopy;  Laterality: N/A;  . FLEXIBLE SIGMOIDOSCOPY N/A 12/13/2017   Procedure: FLEXIBLE SIGMOIDOSCOPY;  Surgeon: Carol Ada, MD;  [facility];  Service: Endoscopy;  Laterality: N/A;  . INGUINAL HERNIA REPAIR Left 1980s   AND REMOVAL CYST ON UPPER BACK  . PAROTIDECTOMY Right 09-21-1999   dr Janace Hoard _0    salivery gland mass  (benign warthin's tumor)  . TRANSTHORACIC ECHOCARDIOGRAM  01/04/2018   mild LVH, ef 02-72%, grade 2 diastolic function/  trivial AR and MR/  mild LAE/  mild to moderate TR    FAMILY HISTORY: Family  History  Problem Relation Age of Onset  . Cancer Father   . Alzheimer's disease Father   . Cancer Mother   . Kidney disease Paternal Aunt     SOCIAL HISTORY: Social History   Socioeconomic History  . Marital status: Married    Spouse name: Not on file  . Number of children: 1  . Years of education: Not on file  . Highest education level: Not on file  Occupational History  . Occupation: Theme park manager    Comment: [employer]  Tobacco Use  . Smoking status: Former Smoker    Packs/day: 1.00    Years: 30.00    Pack years: 30.00  Types: Cigarettes    Quit date: 01/20/2015    Years since quitting: 5.3  . Smokeless tobacco: Never Used  Vaping Use  . Vaping Use: Never used  Substance and Sexual Activity  . Alcohol use: No    Alcohol/week: 0.0 standard drinks  . Drug use: No  . Sexual activity: Not on file  Other Topics Concern  . Not on file  Social History Narrative   Drinks 2-3 caffeine drinks a day    Social Determinants of Health   Financial Resource Strain:   . Difficulty of Paying Living Expenses: Not on file  Food Insecurity:   . Worried About Charity fundraiser in the Last Year: Not on file  . Ran Out of Food in the Last Year: Not on file  Transportation Needs:   . Lack of Transportation (Medical): Not on file  . Lack of Transportation (Non-Medical): Not on file  Physical Activity:   . Days of Exercise per Week: Not on file  . Minutes of Exercise per Session: Not on file  Stress:   . Feeling of Stress : Not on file  Social Connections:   . Frequency of Communication with Friends and Family: Not on file  . Frequency of Social Gatherings with Friends and Family: Not on file  . Attends Religious Services: Not on file  . Active Member of Clubs or Organizations: Not on file  . Attends Archivist Meetings: Not on file  . Marital Status: Not on file  Intimate Partner Violence:   . Fear of Current or Ex-Partner: Not on file  . Emotionally Abused: Not on file    . Physically Abused: Not on file  . Sexually Abused: Not on file      PHYSICAL EXAM  Vitals:   05/24/20 1023  Weight: 221 lb 6.4 oz (100.4 kg)  Height: _0  (1.905 m)   Body mass index is 27.67 kg/m.  Generalized: Well developed, in no acute distress  Chest: Lungs clear to auscultation bilaterally  Neurological examination  Mentation: Alert oriented to time, place, history taking. Follows all commands speech and language fluent Cranial nerve II-XII: Extraocular movements were full, visual field were full on confrontational test Head turning and shoulder shrug  were normal and symmetric. Motor: The motor testing reveals 5 over 5 strength of all 4 extremities. Good symmetric motor tone is noted throughout.  Sensory: Sensory testing is intact to soft touch on all 4 extremities. No evidence of extinction is noted.  Gait and station: Gait is normal.    DIAGNOSTIC DATA (LABS, IMAGING, TESTING) - I reviewed patient records, labs, notes, testing and imaging myself where available.  Lab Results  Component Value Date   WBC 5.1 06/02/2018   HGB 11.1 (L) 06/02/2018   HCT 38.8 (L) 06/02/2018   MCV 87.0 06/02/2018   PLT 222 06/02/2018      Component Value Date/Time   NA 139 06/01/2018 0407   K 4.1 06/01/2018 0407   CL 103 06/01/2018 0407   CO2 30 06/01/2018 0407   GLUCOSE 108 (H) 06/01/2018 0407   BUN 11 06/01/2018 0407   CREATININE 0.99 06/01/2018 0407   CREATININE 1.23 04/10/2011 1307   CALCIUM 8.3 (L) 06/01/2018 0407   PROT 6.0 (L) 01/02/2018 1639   ALBUMIN 3.1 (L) 01/02/2018 1639   AST 14 (L) 01/02/2018 1639   ALT 15 (L) 01/02/2018 1639   ALKPHOS 78 01/02/2018 1639   BILITOT 1.0 01/02/2018 1639   GFRNONAA >60 06/01/2018 0407  GFRAA >60 06/01/2018 0407   No results found for: CHOL, HDL, LDLCALC, LDLDIRECT, TRIG, CHOLHDL Lab Results  Component Value Date   HGBA1C 6.5 (H) 05/21/2018   Lab Results  Component Value Date   VITAMINB12 127 (L) 12/19/2017   No  results found for: TSH    ASSESSMENT AND PLAN 74 y.o. year old male  has a past medical history of AAA (abdominal aortic aneurysm) ([place]), Acute GI bleeding (12/19/2017), Allergic rhinitis, seasonal, Anemia secondary to renal failure, Anticoagulant long-term use, Arthritis, B12 deficiency, BPH (benign prostatic hyperplasia), CKD (chronic kidney disease), stage III ([place]), Colonic stricture ([place]) (12/13/2017), Colostomy present ([place]), Congenital absence of left kidney (followed by nephrologist at [facility]--- dr detarding), Duodenal ulcer (12/20/2017), Dyslipidemia, Esophageal candidiasis ([place]) (12/19/2017), Gastric ulcer without hemorrhage or perforation (12/20/2017), History of bowel resection (12/13/2017), Hypercholesteremia (09/19/2018), Hyperparathyroidism, secondary renal ([place]), Hypertension, Left carotid bruit, OSA on CPAP, Permanent atrial fibrillation ([place]) (2017), Solitary right kidney, Type 2 diabetes mellitus ([place]), Wears glasses, and Wears partial dentures. here with:  1. OSA on CPAP  -We will continue to try to reach [employer] to get a download. -Patient is encouraged to continue using CPAP nightly and greater than 4 hours each night. -Follow-up in 1 year or sooner if needed   I spent 20 minutes of face-to-face and non-face-to-face time with patient.  This included previsit chart review, lab review, study review, order entry, electronic health record documentation, patient education.  Ward Givens, MSN, NP-C 05/24/2020, 10:24 AM [facility] [address], [place], [place] [address] [phone]

## 2020-05-24 NOTE — Patient Instructions (Signed)
Continue using CPAP nightly and greater than 4 hours each night °If your symptoms worsen or you develop new symptoms please let us know.  ° °

## 2020-05-27 DIAGNOSIS — E7800 Pure hypercholesterolemia, unspecified: Secondary | ICD-10-CM | POA: Diagnosis not present

## 2020-05-27 DIAGNOSIS — M5442 Lumbago with sciatica, left side: Secondary | ICD-10-CM | POA: Diagnosis not present

## 2020-05-27 DIAGNOSIS — M109 Gout, unspecified: Secondary | ICD-10-CM | POA: Diagnosis not present

## 2020-05-27 DIAGNOSIS — I10 Essential (primary) hypertension: Secondary | ICD-10-CM | POA: Diagnosis not present

## 2020-05-27 DIAGNOSIS — Z23 Encounter for immunization: Secondary | ICD-10-CM | POA: Diagnosis not present

## 2020-05-27 DIAGNOSIS — Z125 Encounter for screening for malignant neoplasm of prostate: Secondary | ICD-10-CM | POA: Diagnosis not present

## 2020-05-27 DIAGNOSIS — Z0000 Encounter for general adult medical examination without abnormal findings: Secondary | ICD-10-CM | POA: Diagnosis not present

## 2020-05-27 DIAGNOSIS — E119 Type 2 diabetes mellitus without complications: Secondary | ICD-10-CM | POA: Diagnosis not present

## 2020-06-08 DIAGNOSIS — G4733 Obstructive sleep apnea (adult) (pediatric): Secondary | ICD-10-CM | POA: Diagnosis not present

## 2020-08-16 DIAGNOSIS — G4733 Obstructive sleep apnea (adult) (pediatric): Secondary | ICD-10-CM | POA: Diagnosis not present

## 2020-08-29 DIAGNOSIS — E1122 Type 2 diabetes mellitus with diabetic chronic kidney disease: Secondary | ICD-10-CM | POA: Diagnosis not present

## 2020-08-29 DIAGNOSIS — R809 Proteinuria, unspecified: Secondary | ICD-10-CM | POA: Diagnosis not present

## 2020-08-29 DIAGNOSIS — N189 Chronic kidney disease, unspecified: Secondary | ICD-10-CM | POA: Diagnosis not present

## 2020-08-29 DIAGNOSIS — N400 Enlarged prostate without lower urinary tract symptoms: Secondary | ICD-10-CM | POA: Diagnosis not present

## 2020-08-29 DIAGNOSIS — E1129 Type 2 diabetes mellitus with other diabetic kidney complication: Secondary | ICD-10-CM | POA: Diagnosis not present

## 2020-08-29 DIAGNOSIS — N401 Enlarged prostate with lower urinary tract symptoms: Secondary | ICD-10-CM | POA: Diagnosis not present

## 2020-08-29 DIAGNOSIS — D631 Anemia in chronic kidney disease: Secondary | ICD-10-CM | POA: Diagnosis not present

## 2020-08-29 DIAGNOSIS — I129 Hypertensive chronic kidney disease with stage 1 through stage 4 chronic kidney disease, or unspecified chronic kidney disease: Secondary | ICD-10-CM | POA: Diagnosis not present

## 2020-08-29 DIAGNOSIS — D519 Vitamin B12 deficiency anemia, unspecified: Secondary | ICD-10-CM | POA: Diagnosis not present

## 2020-08-29 DIAGNOSIS — D529 Folate deficiency anemia, unspecified: Secondary | ICD-10-CM | POA: Diagnosis not present

## 2020-08-29 DIAGNOSIS — N182 Chronic kidney disease, stage 2 (mild): Secondary | ICD-10-CM | POA: Diagnosis not present

## 2020-08-29 DIAGNOSIS — E785 Hyperlipidemia, unspecified: Secondary | ICD-10-CM | POA: Diagnosis not present

## 2020-08-29 DIAGNOSIS — N2581 Secondary hyperparathyroidism of renal origin: Secondary | ICD-10-CM | POA: Diagnosis not present

## 2020-09-21 ENCOUNTER — Other Ambulatory Visit: Payer: Self-pay

## 2020-09-21 ENCOUNTER — Encounter: Payer: Self-pay | Admitting: Cardiology

## 2020-09-21 ENCOUNTER — Ambulatory Visit: Payer: Medicare HMO | Admitting: Cardiology

## 2020-09-21 VITALS — BP 127/83 | HR 61 | Temp 98.4°F | Resp 14 | Ht 75.0 in | Wt 217.8 lb

## 2020-09-21 DIAGNOSIS — E7800 Pure hypercholesterolemia, unspecified: Secondary | ICD-10-CM | POA: Diagnosis not present

## 2020-09-21 DIAGNOSIS — I10 Essential (primary) hypertension: Secondary | ICD-10-CM | POA: Diagnosis not present

## 2020-09-21 DIAGNOSIS — I4821 Permanent atrial fibrillation: Secondary | ICD-10-CM

## 2020-09-21 DIAGNOSIS — G4733 Obstructive sleep apnea (adult) (pediatric): Secondary | ICD-10-CM | POA: Diagnosis not present

## 2020-09-21 NOTE — Progress Notes (Signed)
Primary Physician/Referring:  Jani Gravel, MD  Patient ID: Manuel [name], male    DOB: 24-Dec-1945, 75 y.o.   MRN: [id number]  Chief Complaint  Patient presents with  . Permanent atrial fibrillation   . Follow-up    1 year   HPI:    Manuel [name]  is a 75 y.o. AAM patient with permanent atrial fibrillation, hypertension, diabetes mellitus which is well controlled presents here for followup. He quit smoking in 2016.   Sleep study in 2016 revealed severe obstructive sleep apnea and is presently on CPAP.  He is presently doing well, he has not had any bleeding applications from Eliquis, denies palpitations, dizziness or syncope.   He had colonic stricture and pneumatosis leading to obstruction on 12/13/2017 and had colostomy which was reversed.  Past Medical History:  Diagnosis Date  . AAA (abdominal aortic aneurysm) ([place])    per cardiologist note dated 03-14-2018 last duplex , 30-3m  (dr gEinar Gip  . Acute GI bleeding 12/19/2017  . Allergic rhinitis, seasonal   . Anemia secondary to renal failure   . Anticoagulant long-term use    ELIQUIS  . Arthritis    LOWER BACK  . B12 deficiency   . BPH (benign prostatic hyperplasia)   . CKD (chronic kidney disease), stage III ([place]   . [place] ([place] 12/13/2017  . Colostomy present ([place]   . Congenital absence of left kidney [facility]   per CT 12-11-2017 and 01-02-2018  . Duodenal ulcer 12/20/2017  . Dyslipidemia   . Esophageal candidiasis ([place] 12/19/2017  . Gastric ulcer without hemorrhage or perforation 12/20/2017  . History of bowel resection 12/13/2017   for sigmoid stricture-- emergency sigmoidectomy  . Hypercholesteremia 09/19/2018  . Hyperparathyroidism, secondary renal ([place]   . Hypertension   . Left carotid bruit   . OSA on CPAP    [facility]  . Permanent [facility] ([facility] 2017   cardiologist-  dr gEinar Gip . Solitary right kidney   .  Type 2 diabetes mellitus ([place]    followed by pcp  . Wears glasses   . Wears partial dentures    UPPER AND LOWER   Past Surgical History:  Procedure Laterality Date  . BIOPSY  12/19/2017   Procedure: BIOPSY;  Surgeon: HCarol Ada MD;  [facility];  Service: Endoscopy;;  . CARDIOVASCULAR STRESS TEST  11/16/2011   low risk nuclear study w/ no ischemia/  normal LV function and wall motion , ef 60%  . CARDIOVERSION  12/18/2011   Procedure: CARDIOVERSION;  Surgeon: JLaverda Page MD;  [facility]  Service: Cardiovascular;  Laterality: N/A;  . CATARACT EXTRACTION W/ INTRAOCULAR LENS IMPLANT Right 2015  . COLON RESECTION N/A 12/13/2017   Procedure: LAPAROSCOPIC END COLOSTOMY HARTMAN'S PROCEDURE;  Surgeon: TLeighton Ruff MD;  [facility];  Service: General;  Laterality: N/A;  . COLOSTOMY TAKEDOWN N/A 05/29/2018   Procedure: LAPAROSCOPIC COLOSTOMY REVERSAL ERAS PATHWAY, RIGID PROCTOSCOPY;  Surgeon: TLeighton Ruff MD;  [facility];  Service: General;  Laterality: N/A;  . CYST REMOVED FROM UPPER BACK  09/2017   BENIGN  . ESOPHAGOGASTRODUODENOSCOPY N/A 12/19/2017   Procedure: ESOPHAGOGASTRODUODENOSCOPY (EGD);  Surgeon: HCarol Ada MD;  [facility];  Service: Endoscopy;  Laterality: N/A;  . FLEXIBLE SIGMOIDOSCOPY N/A 12/13/2017   Procedure: FLEXIBLE SIGMOIDOSCOPY;  Surgeon: HCarol Ada MD;  [facility];  Service: Endoscopy;  Laterality: N/A;  . INGUINAL HERNIA REPAIR Left  1980s   AND REMOVAL CYST ON UPPER BACK  . PAROTIDECTOMY Right 09-21-1999   dr Janace Hoard _0    salivery gland mass  (benign warthin's tumor)  . TRANSTHORACIC ECHOCARDIOGRAM  01/04/2018   mild LVH, ef 51-88%, grade 2 diastolic function/  trivial AR and MR/  mild LAE/  mild to moderate TR   Family History  Problem Relation Age of Onset  . Cancer Father   . Alzheimer's disease Father   . Cancer Mother   . Kidney disease Paternal Aunt     Social History   Tobacco Use  .  Smoking status: Former Smoker    Packs/day: 1.00    Years: 30.00    Pack years: 30.00    Types: Cigarettes    Quit date: 01/20/2015    Years since quitting: 5.6  . Smokeless tobacco: Never Used  Substance Use Topics  . Alcohol use: No    Alcohol/week: 0.0 standard drinks   ROS  Review of Systems  Cardiovascular: Negative for chest pain, dyspnea on exertion and leg swelling.  Gastrointestinal: Negative for melena.   Objective  Blood pressure 127/83, pulse 61, temperature 98.4 F (36.9 C), temperature source Temporal, resp. rate 14, height _1  (1.905 m), weight 217 lb 12.8 oz (98.8 kg), SpO2 99 %.  Vitals with BMI 09/21/2020 05/24/2020 09/21/2019  Height _2  _3  _4   Weight 217 lbs 13 oz 221 lbs 6 oz 229 lbs 14 oz  BMI 27.22 41.66 06.30  Systolic 160 109 323  Diastolic 83 80 93  Pulse 61 - 80     Physical Exam HENT:     Head: Atraumatic.  Cardiovascular:     Rate and Rhythm: Normal rate. Rhythm irregularly irregular.     Pulses:          Femoral pulses are 2+ on the right side and 2+ on the left side.      Dorsalis pedis pulses are 0 on the right side and 0 on the left side.       Posterior tibial pulses are 0 on the right side and 0 on the left side.     Heart sounds: No murmur heard. No gallop. No S3 or S4 sounds.      Comments: S1 is variable, S2 is normal. No JVD, 1-2+ bilateral ankle edema. Pulmonary:     Effort: Pulmonary effort is normal.     Breath sounds: Rales (scattered diffuse crackles) present.  Abdominal:     General: Bowel sounds are normal.     Palpations: Abdomen is soft.    Laboratory examination:     External labs:    Labs 05/20/2020: Hb 15.3/HCT 45.5, platelets 149.  Sodium 147, potassium 4.8, serum glucose 113, BUN 17, creatinine 1.4, EGFR 63 mL, CMP otherwise normal.  Total cholesterol 115, triglycerides 54, HDL 48, LDL 56.  TSH normal.  Labs 07/08/2019: Hb 15.3/HCT 48.6, platelets 174. Serum glucose 89 mg, BUN 14, creatinine 1.2,  EGFR >60 mL, CMP normal. A1c 6.5%. Total cholesterol 115, triglycerides 48, HDL 47, LDL 58. Uric acid 6.5.  Medications and allergies   Allergies  Allergen Reactions  . Sulfa Antibiotics Shortness Of Breath    Headaches     Current Outpatient Medications on File Prior to Visit  Medication Sig Dispense Refill  . acetaminophen (TYLENOL) 500 MG tablet Take 500 mg by mouth every 6 (six) hours as needed for moderate pain.    Marland Kitchen diltiazem (CARDIZEM CD) 120 MG 24 hr capsule Take  120 mg by mouth 2 (two) times daily.   5  . doxazosin (CARDURA) 8 MG tablet Take 8 mg by mouth at bedtime.     Marland Kitchen ELIQUIS 5 MG TABS tablet TAKE 1 TABLET BY MOUTH TWICE A DAY 180 tablet 3  . FARXIGA 10 MG TABS tablet Take 10 mg by mouth daily.    . ferrous sulfate 325 (65 FE) MG tablet Take 325 mg by mouth 2 (two) times daily with a meal.    . glimepiride (AMARYL) 1 MG tablet Take 1 mg by mouth daily with breakfast.    . lisinopril (PRINIVIL,ZESTRIL) 20 MG tablet Take 20 mg by mouth at bedtime.     . metFORMIN (GLUCOPHAGE) 500 MG tablet Take 500 mg by mouth 2 (two) times daily with a meal.   5  . pravastatin (PRAVACHOL) 20 MG tablet Take 20 mg by mouth every evening.     . furosemide (LASIX) 40 MG tablet Take 40 mg by mouth every morning. (Patient not taking: Reported on 09/21/2020)     No current facility-administered medications on file prior to visit.     Radiology:   No AAA by CTA abdomen in 2019.  CXR 12/02/2017: Chronic bibasilar densities, likely scarring.  Mild cardiomegaly.  Cardiac Studies:   Exercise sestamibi 11/16/11: Gut uptake artifact. No ischemia. EF 60%.Low risk. 7.3 METs. Symptoms included Dyspnea.   Echocardiogram 01/04/2018: - Left ventricle: The cavity size was normal. Wall thickness was  increased in a pattern of mild LVH. Systolic function was normal.  The estimated ejection fraction was in the range of 50% to 55%.  Wall motion was normal; there were no regional wall motion   abnormalities. Features are consistent with a pseudonormal left  ventricular filling pattern, with concomitant abnormal relaxation  and increased filling pressure (grade 2 diastolic dysfunction).  -Aortic valve: Trace aortic regurgitation. -Left atrium is mildly dilated.  EKG:   EKG 09/21/2020: Atrial fibrillation with controlled ventricular response at the rate of 63 bpm, leftward axis, anteroseptal infarct old.  No evidence of ischemia.  Normal QT interval.   No significant change from EKG 09/21/2019   Assessment     ICD-10-CM   1. Permanent atrial fibrillation (HCC)  I48.21 EKG 12-Lead  2. Essential hypertension  I10   3. Hypercholesteremia  E78.00   4. OSA (obstructive sleep apnea)  G47.33    CHA2DS2-VASc Score is 4.  Yearly risk of stroke: 4.8% (A, HTN, DM).  Score of 1=0.6; 2=2.2; 3=3.2; 4=4.8; 5=7.2; 6=9.8; 7=>9.8) -(CHF; HTN; vasc disease DM,  Male = 1; Age <65 =0; 65-74 = 1,  >75 =2; stroke/embolism= 2).   No orders of the defined types were placed in this encounter.  Medications Discontinued During This Encounter  Medication Reason  . cyanocobalamin (,VITAMIN B-12,) 1000 MCG/ML injection Error   Recommendations:   Manuel [name]  is a 75 y.o. AAM patient with permanent atrial fibrillation, hypertension, diabetes mellitus which is well controlled presents here for followup. He quit smoking in 2016. Sleep study in 2016 revealed severe obstructive sleep apnea and has been compliant with CPAP.  He is presently doing well and essentially asymptomatic, atrial fibrillation is rate controlled.  He is tolerating anticoagulation.  I reviewed his external labs, blood pressure is well controlled, diabetes is well controlled and hemoglobin is remained stable.  No changes in the medications were done by me today, I will see him back in 1 year or sooner if problems.  He does have underlying COPD  with diffuse crackles in his lungs, chest x-ray also revealing scarring in his  lungs.  No clinical evidence of heart failure.  He also has peripheral arterial disease with markedly decreased pedal pulses but no ulceration or symptoms of claudication.  Continue aggressive medical therapy with primary prevention.  I will see him back in a year.   Adrian Prows, MD, [facility] 09/21/2020, 11:23 AM Office: [phone] Pager: [phone]

## 2020-09-28 DIAGNOSIS — E119 Type 2 diabetes mellitus without complications: Secondary | ICD-10-CM | POA: Diagnosis not present

## 2020-10-05 DIAGNOSIS — I10 Essential (primary) hypertension: Secondary | ICD-10-CM | POA: Diagnosis not present

## 2020-10-05 DIAGNOSIS — E785 Hyperlipidemia, unspecified: Secondary | ICD-10-CM | POA: Diagnosis not present

## 2020-10-05 DIAGNOSIS — Z6827 Body mass index (BMI) 27.0-27.9, adult: Secondary | ICD-10-CM | POA: Diagnosis not present

## 2020-10-05 DIAGNOSIS — E1121 Type 2 diabetes mellitus with diabetic nephropathy: Secondary | ICD-10-CM | POA: Diagnosis not present

## 2020-10-05 DIAGNOSIS — R809 Proteinuria, unspecified: Secondary | ICD-10-CM | POA: Diagnosis not present

## 2020-10-05 DIAGNOSIS — I480 Paroxysmal atrial fibrillation: Secondary | ICD-10-CM | POA: Diagnosis not present

## 2020-10-17 DIAGNOSIS — G4733 Obstructive sleep apnea (adult) (pediatric): Secondary | ICD-10-CM | POA: Diagnosis not present

## 2020-10-17 DIAGNOSIS — I2510 Atherosclerotic heart disease of native coronary artery without angina pectoris: Secondary | ICD-10-CM | POA: Diagnosis not present

## 2020-10-17 DIAGNOSIS — I4891 Unspecified atrial fibrillation: Secondary | ICD-10-CM | POA: Diagnosis not present

## 2020-10-17 DIAGNOSIS — Z7901 Long term (current) use of anticoagulants: Secondary | ICD-10-CM | POA: Diagnosis not present

## 2020-10-17 DIAGNOSIS — I10 Essential (primary) hypertension: Secondary | ICD-10-CM | POA: Diagnosis not present

## 2020-10-17 DIAGNOSIS — Z8249 Family history of ischemic heart disease and other diseases of the circulatory system: Secondary | ICD-10-CM | POA: Diagnosis not present

## 2020-10-17 DIAGNOSIS — D6869 Other thrombophilia: Secondary | ICD-10-CM | POA: Diagnosis not present

## 2020-10-17 DIAGNOSIS — E119 Type 2 diabetes mellitus without complications: Secondary | ICD-10-CM | POA: Diagnosis not present

## 2020-10-17 DIAGNOSIS — Z7984 Long term (current) use of oral hypoglycemic drugs: Secondary | ICD-10-CM | POA: Diagnosis not present

## 2020-10-17 DIAGNOSIS — E785 Hyperlipidemia, unspecified: Secondary | ICD-10-CM | POA: Diagnosis not present

## 2020-11-03 DIAGNOSIS — E7800 Pure hypercholesterolemia, unspecified: Secondary | ICD-10-CM | POA: Diagnosis not present

## 2020-11-03 DIAGNOSIS — H2512 Age-related nuclear cataract, left eye: Secondary | ICD-10-CM | POA: Diagnosis not present

## 2020-11-03 DIAGNOSIS — H5200 Hypermetropia, unspecified eye: Secondary | ICD-10-CM | POA: Diagnosis not present

## 2020-11-03 DIAGNOSIS — Z0100 Encounter for examination of eyes and vision without abnormal findings: Secondary | ICD-10-CM | POA: Diagnosis not present

## 2020-11-03 DIAGNOSIS — H26491 Other secondary cataract, right eye: Secondary | ICD-10-CM | POA: Diagnosis not present

## 2020-12-09 DIAGNOSIS — D649 Anemia, unspecified: Secondary | ICD-10-CM | POA: Diagnosis not present

## 2020-12-09 DIAGNOSIS — E119 Type 2 diabetes mellitus without complications: Secondary | ICD-10-CM | POA: Diagnosis not present

## 2020-12-09 DIAGNOSIS — E7800 Pure hypercholesterolemia, unspecified: Secondary | ICD-10-CM | POA: Diagnosis not present

## 2020-12-09 DIAGNOSIS — N1831 Chronic kidney disease, stage 3a: Secondary | ICD-10-CM | POA: Diagnosis not present

## 2020-12-09 DIAGNOSIS — Z125 Encounter for screening for malignant neoplasm of prostate: Secondary | ICD-10-CM | POA: Diagnosis not present

## 2020-12-09 DIAGNOSIS — Z0000 Encounter for general adult medical examination without abnormal findings: Secondary | ICD-10-CM | POA: Diagnosis not present

## 2020-12-09 DIAGNOSIS — I10 Essential (primary) hypertension: Secondary | ICD-10-CM | POA: Diagnosis not present

## 2020-12-16 DIAGNOSIS — R351 Nocturia: Secondary | ICD-10-CM | POA: Diagnosis not present

## 2020-12-16 DIAGNOSIS — E1122 Type 2 diabetes mellitus with diabetic chronic kidney disease: Secondary | ICD-10-CM | POA: Diagnosis not present

## 2020-12-16 DIAGNOSIS — I480 Paroxysmal atrial fibrillation: Secondary | ICD-10-CM | POA: Diagnosis not present

## 2020-12-16 DIAGNOSIS — K4090 Unilateral inguinal hernia, without obstruction or gangrene, not specified as recurrent: Secondary | ICD-10-CM | POA: Diagnosis not present

## 2020-12-16 DIAGNOSIS — E7800 Pure hypercholesterolemia, unspecified: Secondary | ICD-10-CM | POA: Diagnosis not present

## 2020-12-21 DIAGNOSIS — Z23 Encounter for immunization: Secondary | ICD-10-CM | POA: Diagnosis not present

## 2020-12-30 DIAGNOSIS — G4733 Obstructive sleep apnea (adult) (pediatric): Secondary | ICD-10-CM | POA: Diagnosis not present

## 2021-03-13 DIAGNOSIS — E7800 Pure hypercholesterolemia, unspecified: Secondary | ICD-10-CM | POA: Diagnosis not present

## 2021-03-13 DIAGNOSIS — E1122 Type 2 diabetes mellitus with diabetic chronic kidney disease: Secondary | ICD-10-CM | POA: Diagnosis not present

## 2021-03-20 DIAGNOSIS — K4090 Unilateral inguinal hernia, without obstruction or gangrene, not specified as recurrent: Secondary | ICD-10-CM | POA: Diagnosis not present

## 2021-03-20 DIAGNOSIS — E7800 Pure hypercholesterolemia, unspecified: Secondary | ICD-10-CM | POA: Diagnosis not present

## 2021-03-20 DIAGNOSIS — R351 Nocturia: Secondary | ICD-10-CM | POA: Diagnosis not present

## 2021-03-20 DIAGNOSIS — E1122 Type 2 diabetes mellitus with diabetic chronic kidney disease: Secondary | ICD-10-CM | POA: Diagnosis not present

## 2021-03-20 DIAGNOSIS — I480 Paroxysmal atrial fibrillation: Secondary | ICD-10-CM | POA: Diagnosis not present

## 2021-03-20 DIAGNOSIS — N401 Enlarged prostate with lower urinary tract symptoms: Secondary | ICD-10-CM | POA: Diagnosis not present

## 2021-03-28 ENCOUNTER — Other Ambulatory Visit: Payer: Self-pay | Admitting: Cardiology

## 2021-03-30 DIAGNOSIS — G4733 Obstructive sleep apnea (adult) (pediatric): Secondary | ICD-10-CM | POA: Diagnosis not present

## 2021-03-31 DIAGNOSIS — Z23 Encounter for immunization: Secondary | ICD-10-CM | POA: Diagnosis not present

## 2021-04-05 DIAGNOSIS — N400 Enlarged prostate without lower urinary tract symptoms: Secondary | ICD-10-CM | POA: Diagnosis not present

## 2021-04-05 DIAGNOSIS — D631 Anemia in chronic kidney disease: Secondary | ICD-10-CM | POA: Diagnosis not present

## 2021-04-05 DIAGNOSIS — N189 Chronic kidney disease, unspecified: Secondary | ICD-10-CM | POA: Diagnosis not present

## 2021-04-05 DIAGNOSIS — D519 Vitamin B12 deficiency anemia, unspecified: Secondary | ICD-10-CM | POA: Diagnosis not present

## 2021-04-05 DIAGNOSIS — E1122 Type 2 diabetes mellitus with diabetic chronic kidney disease: Secondary | ICD-10-CM | POA: Diagnosis not present

## 2021-04-05 DIAGNOSIS — D529 Folate deficiency anemia, unspecified: Secondary | ICD-10-CM | POA: Diagnosis not present

## 2021-04-05 DIAGNOSIS — I129 Hypertensive chronic kidney disease with stage 1 through stage 4 chronic kidney disease, or unspecified chronic kidney disease: Secondary | ICD-10-CM | POA: Diagnosis not present

## 2021-04-05 DIAGNOSIS — R809 Proteinuria, unspecified: Secondary | ICD-10-CM | POA: Diagnosis not present

## 2021-04-05 DIAGNOSIS — N2581 Secondary hyperparathyroidism of renal origin: Secondary | ICD-10-CM | POA: Diagnosis not present

## 2021-04-05 DIAGNOSIS — N182 Chronic kidney disease, stage 2 (mild): Secondary | ICD-10-CM | POA: Diagnosis not present

## 2021-04-05 DIAGNOSIS — E785 Hyperlipidemia, unspecified: Secondary | ICD-10-CM | POA: Diagnosis not present

## 2021-04-05 DIAGNOSIS — E1129 Type 2 diabetes mellitus with other diabetic kidney complication: Secondary | ICD-10-CM | POA: Diagnosis not present

## 2021-04-10 DIAGNOSIS — E1121 Type 2 diabetes mellitus with diabetic nephropathy: Secondary | ICD-10-CM | POA: Diagnosis not present

## 2021-04-10 DIAGNOSIS — R809 Proteinuria, unspecified: Secondary | ICD-10-CM | POA: Diagnosis not present

## 2021-04-10 DIAGNOSIS — D72819 Decreased white blood cell count, unspecified: Secondary | ICD-10-CM | POA: Diagnosis not present

## 2021-04-10 DIAGNOSIS — Z6827 Body mass index (BMI) 27.0-27.9, adult: Secondary | ICD-10-CM | POA: Diagnosis not present

## 2021-04-10 DIAGNOSIS — I10 Essential (primary) hypertension: Secondary | ICD-10-CM | POA: Diagnosis not present

## 2021-04-10 DIAGNOSIS — I480 Paroxysmal atrial fibrillation: Secondary | ICD-10-CM | POA: Diagnosis not present

## 2021-04-10 DIAGNOSIS — E785 Hyperlipidemia, unspecified: Secondary | ICD-10-CM | POA: Diagnosis not present

## 2021-04-17 ENCOUNTER — Other Ambulatory Visit: Payer: Self-pay | Admitting: *Deleted

## 2021-04-17 DIAGNOSIS — D72819 Decreased white blood cell count, unspecified: Secondary | ICD-10-CM

## 2021-04-17 NOTE — Progress Notes (Signed)
New patient appt made for 10/26 with labs on 10/24

## 2021-05-02 DIAGNOSIS — R809 Proteinuria, unspecified: Secondary | ICD-10-CM | POA: Diagnosis not present

## 2021-05-02 DIAGNOSIS — R5383 Other fatigue: Secondary | ICD-10-CM | POA: Diagnosis not present

## 2021-05-02 DIAGNOSIS — D709 Neutropenia, unspecified: Secondary | ICD-10-CM | POA: Diagnosis not present

## 2021-05-02 DIAGNOSIS — D649 Anemia, unspecified: Secondary | ICD-10-CM | POA: Diagnosis not present

## 2021-05-08 DIAGNOSIS — K4090 Unilateral inguinal hernia, without obstruction or gangrene, not specified as recurrent: Secondary | ICD-10-CM | POA: Diagnosis not present

## 2021-05-08 DIAGNOSIS — I480 Paroxysmal atrial fibrillation: Secondary | ICD-10-CM | POA: Diagnosis not present

## 2021-05-08 DIAGNOSIS — E559 Vitamin D deficiency, unspecified: Secondary | ICD-10-CM | POA: Diagnosis not present

## 2021-05-08 DIAGNOSIS — E1122 Type 2 diabetes mellitus with diabetic chronic kidney disease: Secondary | ICD-10-CM | POA: Diagnosis not present

## 2021-05-08 DIAGNOSIS — D72819 Decreased white blood cell count, unspecified: Secondary | ICD-10-CM | POA: Diagnosis not present

## 2021-05-08 DIAGNOSIS — D508 Other iron deficiency anemias: Secondary | ICD-10-CM | POA: Diagnosis not present

## 2021-05-08 DIAGNOSIS — N1831 Chronic kidney disease, stage 3a: Secondary | ICD-10-CM | POA: Diagnosis not present

## 2021-05-15 ENCOUNTER — Other Ambulatory Visit: Payer: Self-pay

## 2021-05-15 ENCOUNTER — Inpatient Hospital Stay: Payer: Medicare HMO | Attending: Oncology

## 2021-05-15 DIAGNOSIS — E538 Deficiency of other specified B group vitamins: Secondary | ICD-10-CM | POA: Insufficient documentation

## 2021-05-15 DIAGNOSIS — Z7901 Long term (current) use of anticoagulants: Secondary | ICD-10-CM | POA: Insufficient documentation

## 2021-05-15 DIAGNOSIS — Z933 Colostomy status: Secondary | ICD-10-CM | POA: Insufficient documentation

## 2021-05-15 DIAGNOSIS — R69 Illness, unspecified: Secondary | ICD-10-CM | POA: Diagnosis not present

## 2021-05-15 DIAGNOSIS — E119 Type 2 diabetes mellitus without complications: Secondary | ICD-10-CM | POA: Diagnosis not present

## 2021-05-15 DIAGNOSIS — Z818 Family history of other mental and behavioral disorders: Secondary | ICD-10-CM | POA: Insufficient documentation

## 2021-05-15 DIAGNOSIS — Z8711 Personal history of peptic ulcer disease: Secondary | ICD-10-CM | POA: Insufficient documentation

## 2021-05-15 DIAGNOSIS — Z808 Family history of malignant neoplasm of other organs or systems: Secondary | ICD-10-CM | POA: Diagnosis not present

## 2021-05-15 DIAGNOSIS — Z9049 Acquired absence of other specified parts of digestive tract: Secondary | ICD-10-CM | POA: Diagnosis not present

## 2021-05-15 DIAGNOSIS — F17210 Nicotine dependence, cigarettes, uncomplicated: Secondary | ICD-10-CM | POA: Insufficient documentation

## 2021-05-15 DIAGNOSIS — Z8249 Family history of ischemic heart disease and other diseases of the circulatory system: Secondary | ICD-10-CM | POA: Diagnosis not present

## 2021-05-15 DIAGNOSIS — Z882 Allergy status to sulfonamides status: Secondary | ICD-10-CM | POA: Insufficient documentation

## 2021-05-15 DIAGNOSIS — D72819 Decreased white blood cell count, unspecified: Secondary | ICD-10-CM | POA: Diagnosis not present

## 2021-05-15 DIAGNOSIS — G4733 Obstructive sleep apnea (adult) (pediatric): Secondary | ICD-10-CM | POA: Insufficient documentation

## 2021-05-15 DIAGNOSIS — I4821 Permanent atrial fibrillation: Secondary | ICD-10-CM | POA: Diagnosis not present

## 2021-05-15 DIAGNOSIS — E7800 Pure hypercholesterolemia, unspecified: Secondary | ICD-10-CM | POA: Diagnosis not present

## 2021-05-15 LAB — CBC WITH DIFFERENTIAL (CANCER CENTER ONLY)
Abs Immature Granulocytes: 0 10*3/uL (ref 0.00–0.07)
Basophils Absolute: 0 10*3/uL (ref 0.0–0.1)
Basophils Relative: 1 %
Eosinophils Absolute: 0 10*3/uL (ref 0.0–0.5)
Eosinophils Relative: 1 %
HCT: 46.7 % (ref 39.0–52.0)
Hemoglobin: 13.8 g/dL (ref 13.0–17.0)
Immature Granulocytes: 0 %
Lymphocytes Relative: 17 %
Lymphs Abs: 0.6 10*3/uL — ABNORMAL LOW (ref 0.7–4.0)
MCH: 28.5 pg (ref 26.0–34.0)
MCHC: 29.6 g/dL — ABNORMAL LOW (ref 30.0–36.0)
MCV: 96.5 fL (ref 80.0–100.0)
Monocytes Absolute: 0.3 10*3/uL (ref 0.1–1.0)
Monocytes Relative: 8 %
Neutro Abs: 2.5 10*3/uL (ref 1.7–7.7)
Neutrophils Relative %: 73 %
Platelet Count: 147 10*3/uL — ABNORMAL LOW (ref 150–400)
RBC: 4.84 MIL/uL (ref 4.22–5.81)
RDW: 16.7 % — ABNORMAL HIGH (ref 11.5–15.5)
WBC Count: 3.4 10*3/uL — ABNORMAL LOW (ref 4.0–10.5)
nRBC: 0 % (ref 0.0–0.2)

## 2021-05-15 LAB — SAVE SMEAR(SSMR), FOR PROVIDER SLIDE REVIEW

## 2021-05-17 ENCOUNTER — Inpatient Hospital Stay: Payer: Medicare HMO | Admitting: Nurse Practitioner

## 2021-05-17 ENCOUNTER — Other Ambulatory Visit: Payer: Self-pay

## 2021-05-17 ENCOUNTER — Inpatient Hospital Stay: Payer: Medicare HMO

## 2021-05-17 VITALS — BP 129/81 | HR 79 | Temp 98.7°F | Resp 20 | Ht 75.0 in | Wt 233.6 lb

## 2021-05-17 DIAGNOSIS — Z8719 Personal history of other diseases of the digestive system: Secondary | ICD-10-CM

## 2021-05-17 DIAGNOSIS — I4821 Permanent atrial fibrillation: Secondary | ICD-10-CM | POA: Diagnosis not present

## 2021-05-17 DIAGNOSIS — Z79899 Other long term (current) drug therapy: Secondary | ICD-10-CM | POA: Diagnosis not present

## 2021-05-17 DIAGNOSIS — E7800 Pure hypercholesterolemia, unspecified: Secondary | ICD-10-CM | POA: Diagnosis not present

## 2021-05-17 DIAGNOSIS — E538 Deficiency of other specified B group vitamins: Secondary | ICD-10-CM

## 2021-05-17 DIAGNOSIS — Z808 Family history of malignant neoplasm of other organs or systems: Secondary | ICD-10-CM

## 2021-05-17 DIAGNOSIS — Z8249 Family history of ischemic heart disease and other diseases of the circulatory system: Secondary | ICD-10-CM | POA: Diagnosis not present

## 2021-05-17 DIAGNOSIS — I4891 Unspecified atrial fibrillation: Secondary | ICD-10-CM | POA: Diagnosis not present

## 2021-05-17 DIAGNOSIS — R69 Illness, unspecified: Secondary | ICD-10-CM | POA: Diagnosis not present

## 2021-05-17 DIAGNOSIS — D72819 Decreased white blood cell count, unspecified: Secondary | ICD-10-CM

## 2021-05-17 DIAGNOSIS — Z882 Allergy status to sulfonamides status: Secondary | ICD-10-CM | POA: Diagnosis not present

## 2021-05-17 DIAGNOSIS — G4733 Obstructive sleep apnea (adult) (pediatric): Secondary | ICD-10-CM | POA: Diagnosis not present

## 2021-05-17 DIAGNOSIS — E119 Type 2 diabetes mellitus without complications: Secondary | ICD-10-CM

## 2021-05-17 DIAGNOSIS — G4730 Sleep apnea, unspecified: Secondary | ICD-10-CM | POA: Diagnosis not present

## 2021-05-17 DIAGNOSIS — Z8711 Personal history of peptic ulcer disease: Secondary | ICD-10-CM | POA: Diagnosis not present

## 2021-05-17 DIAGNOSIS — Z818 Family history of other mental and behavioral disorders: Secondary | ICD-10-CM

## 2021-05-17 DIAGNOSIS — Z7901 Long term (current) use of anticoagulants: Secondary | ICD-10-CM | POA: Diagnosis not present

## 2021-05-17 LAB — CMP (CANCER CENTER ONLY)
ALT: 12 U/L (ref 0–44)
AST: 14 U/L — ABNORMAL LOW (ref 15–41)
Albumin: 3.8 g/dL (ref 3.5–5.0)
Alkaline Phosphatase: 82 U/L (ref 38–126)
Anion gap: 5 (ref 5–15)
BUN: 16 mg/dL (ref 8–23)
CO2: 35 mmol/L — ABNORMAL HIGH (ref 22–32)
Calcium: 9 mg/dL (ref 8.9–10.3)
Chloride: 101 mmol/L (ref 98–111)
Creatinine: 1.27 mg/dL — ABNORMAL HIGH (ref 0.61–1.24)
GFR, Estimated: 59 mL/min — ABNORMAL LOW (ref >60)
Glucose, Bld: 86 mg/dL (ref 70–99)
Potassium: 4.2 mmol/L (ref 3.5–5.1)
Sodium: 141 mmol/L (ref 135–145)
Total Bilirubin: 1.2 mg/dL (ref 0.3–1.2)
Total Protein: 6.8 g/dL (ref 6.5–8.1)

## 2021-05-17 LAB — SAVE SMEAR(SSMR), FOR PROVIDER SLIDE REVIEW

## 2021-05-17 NOTE — Progress Notes (Addendum)
New Hematology/Oncology Consult   Requesting MD:  Deon Pilling NP  [phone]  Reason for Consult: Leukopenia  HPI: Mr. Pipkins is a 75 year old man referred for evaluation of leukopenia. Labs from 03/13/2021 showed Hb 14.1, total white count 2.8 (4.5-11), ANC 1.9 (2.0-8.2), absolute lymphocytes 0.6 (0.9-3.6), platelet count 175,000. Labs from 12/09/2020-Hb 15.0, total white count 3.2 (4.5-11), ANC 2.2 (2.0-8.2), absolute lymphocytes 0.7 (0.9-3.6), platelet count 155,000; 09/28/2020-Hb 15.9, total white count 3.2 (4.5-11), platelet count 159,000. Most remote labs are from 03/21/2011-Hb 14.4, total white count 4.3 (4.5-11), normal differential, platelet count 245,000.   Labs done in our office on 05/15/2021-Hb 13.8, total white count 3.4 (4-10.5), ANC 2.5 (1.7-7.7), absolute lymphocytes 0.6 (0.7-4.0), platelet count 147,000.   He recently began oral B12 for history of B12 deficiency.   Past Medical History:  Diagnosis Date   AAA (abdominal aortic aneurysm) ([place])    per cardiologist note dated 03-14-2018 last duplex , 30-36m  (dr gEinar Gip   Acute GI bleeding 12/19/2017   Allergic rhinitis, seasonal    Anemia secondary to renal failure    Anticoagulant long-term use    ELIQUIS   Arthritis    LOWER BACK   B12 deficiency    BPH (benign prostatic hyperplasia)    CKD (chronic kidney disease), stage III ([place]    Colonic stricture ([place] 12/13/2017   Colostomy present ([facility]    Congenital absence of left kidney [facility]   per CT 12-11-2017 and 01-02-2018   Duodenal ulcer 12/20/2017   Dyslipidemia    Esophageal candidiasis ([place] 12/19/2017   Gastric ulcer without hemorrhage or perforation 12/20/2017   History of bowel resection 12/13/2017   for sigmoid stricture-- emergency sigmoidectomy   Hypercholesteremia 09/19/2018   Hyperparathyroidism, secondary renal ([place]    Hypertension    Left carotid bruit    OSA on CPAP    followed by [facility]   Permanent atrial fibrillation ([facility] 2017   cardiologist-  dr gEinar Gip  Solitary right kidney    Type 2 diabetes mellitus ([place]    followed by pcp   Wears glasses    Wears partial dentures    UPPER AND LOWER  :   Past Surgical History:  Procedure Laterality Date   BIOPSY  12/19/2017   Procedure: BIOPSY;  Surgeon: HCarol Ada MD;  [facility];  Service: Endoscopy;;   CARDIOVASCULAR STRESS TEST  11/16/2011   low risk nuclear study w/ no ischemia/  normal LV function and wall motion , ef 60%   CARDIOVERSION  12/18/2011   Procedure: CARDIOVERSION;  Surgeon: JLaverda Page MD;  [facility]  Service: Cardiovascular;  Laterality: N/A;   CATARACT EXTRACTION W/ INTRAOCULAR LENS IMPLANT Right 2015   COLON RESECTION N/A 12/13/2017   Procedure: LAPAROSCOPIC END COLOSTOMY HARTMAN'S PROCEDURE;  Surgeon: TLeighton Ruff MD;  [facility];  Service: General;  Laterality: N/A;   COLOSTOMY TAKEDOWN N/A 05/29/2018   Procedure: LAPAROSCOPIC COLOSTOMY REVERSAL ERAS PATHWAY, RIGID PROCTOSCOPY;  Surgeon: TLeighton Ruff MD;  [facility];  Service: General;  Laterality: N/A;   CYST REMOVED FROM UPPER BACK  09/2017   BENIGN   ESOPHAGOGASTRODUODENOSCOPY N/A 12/19/2017   Procedure: ESOPHAGOGASTRODUODENOSCOPY (EGD);  Surgeon: HCarol Ada MD;  [facility];  Service: Endoscopy;  Laterality: N/A;   FLEXIBLE SIGMOIDOSCOPY N/A 12/13/2017   Procedure: FLEXIBLE SIGMOIDOSCOPY;  Surgeon: HCarol Ada MD;  [facility];  Service: Endoscopy;  Laterality: N/A;   INGUINAL  HERNIA REPAIR Left 1980s   AND REMOVAL CYST ON UPPER BACK   PAROTIDECTOMY Right 09-21-1999   dr Janace Hoard _0    salivery gland mass  (benign warthin's tumor)   TRANSTHORACIC ECHOCARDIOGRAM  01/04/2018   mild LVH, ef 96-29%, grade 2 diastolic function/  trivial AR and MR/  mild LAE/  mild to moderate TR     Current Outpatient Medications:    acetaminophen (TYLENOL) 325 MG tablet, , Disp: ,  Rfl:    acetaminophen (TYLENOL) 500 MG tablet, Take 500 mg by mouth every 6 (six) hours as needed for moderate pain., Disp: , Rfl:    D3-50 1.25 MG (50000 UT) capsule, Take 50,000 Units by mouth once a week., Disp: , Rfl:    diltiazem (CARDIZEM CD) 120 MG 24 hr capsule, Take 120 mg by mouth 2 (two) times daily. , Disp: , Rfl: 5   doxazosin (CARDURA) 8 MG tablet, Take 8 mg by mouth at bedtime. , Disp: , Rfl:    ELIQUIS 5 MG TABS tablet, TAKE 1 TABLET BY MOUTH TWICE A DAY, Disp: 180 tablet, Rfl: 3   FARXIGA 10 MG TABS tablet, Take 10 mg by mouth daily., Disp: , Rfl:    ferrous sulfate 325 (65 FE) MG tablet, Take 325 mg by mouth 2 (two) times daily with a meal., Disp: , Rfl:    glimepiride (AMARYL) 1 MG tablet, Take 1 mg by mouth daily with breakfast., Disp: , Rfl:    lisinopril (PRINIVIL,ZESTRIL) 20 MG tablet, Take 20 mg by mouth at bedtime. , Disp: , Rfl:    metFORMIN (GLUCOPHAGE) 500 MG tablet, Take 500 mg by mouth 2 (two) times daily with a meal. , Disp: , Rfl: 5   pravastatin (PRAVACHOL) 20 MG tablet, Take 20 mg by mouth every evening. , Disp: , Rfl:    vitamin B-12 (CYANOCOBALAMIN) 250 MCG tablet, Take 1,000 mcg by mouth 2 (two) times daily., Disp: , Rfl:    furosemide (LASIX) 40 MG tablet, Take 40 mg by mouth every morning. (Patient not taking: No sig reported), Disp: , Rfl: :   Allergies  Allergen Reactions   Sulfa Antibiotics Shortness Of Breath    Headaches   :  FH: Mother deceased with Alzheimer's; father deceased with "bone cancer".  Sister deceased MI.  Brother deceased unknown cause.  SOCIAL HISTORY: Mr. Boyajian lives in [place].  He is married.  He has a son who lives in [place].  He is retired from the [employer].  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  He served in [employer].  He reports tobacco use at 1 less than 1 pack/day for many years.  No EtOH use.  Review of Systems: He has been more fatigued over the past year.  No fevers or sweats.  He  has a good appetite.  No significant weight loss.  No significant pain.  He is occasionally lightheaded.  No bleeding.  No dysphagia.  He denies shortness of breath.  He reports a chronic cough.  No change in bowel habits.  No bloody or black stools.  He noticed bruising over the abdominal wall about a week ago.  Physical Exam:  Blood pressure 129/81, pulse 79, temperature 98.7 F (37.1 C), temperature source Oral, resp. rate 20, height 6' 3" (1.905 m), weight 233 lb 9.6 oz (106 kg), SpO2 99 %.  HEENT: No thrush or ulcers. Lungs: Rales at both lung bases.  Wheezes anteriorly.  No respiratory distress. Cardiac:  Irregular.  Mild neck vein distention. Abdomen: No hepatosplenomegaly. Vascular: Bilateral lower leg edema. Lymph nodes: No palpable cervical, supraclavicular, axillary or inguinal lymph nodes. Neurologic: Alert and oriented. Skin: Ecchymoses across mid abdominal wall.  LABS:   Recent Labs    05/15/21 1059  WBC 3.4*  HGB 13.8  HCT 46.7  PLT 147*    RADIOLOGY:  No results found.  Assessment and Plan:   Leukopenia B12 deficiency Diabetes Sleep apnea History of colon obstruction due to diverticular disease status post Hartman's procedure with colostomy 12/13/2017; colostomy reversal 05/29/2018 Atrial fibrillation  Mr. Thelin was referred for evaluation of leukopenia.  The leukopenia is chronic dating at least to 2012.  This could be a benign normal variant or related to B12 deficiency.  Peripheral blood smear will be reviewed once available.  Additional labs being obtained today as well.  He will return for a CBC, B12 level and follow-up appointment in 6 months.  We will adjust accordingly pending outstanding lab results from today.  On exam today he has an abnormal lung exam and lower extremity edema.  We recommended he follow-up with PCP/cardiology.  Patient seen with Dr. Benay Spice.  Ned Card, NP 05/17/2021, 12:17 PM   This was a shared visit with Ned Card.  Mr. Bayona was interviewed and examined.  He was referred for evaluation of leukopenia.  Appears to have longstanding mild leukopenia.  This may be a benign normal variant or secondary to vitamin B12 deficiency.  There is no clinical evidence for underlying liver disease, collagen vascular disease, or a hematopoietic malignancy.  We obtained additional diagnostic evaluation today.  He will continue on B12 therapy and return for a CBC in 6 months.  I will review the peripheral blood smear when available later today.  I was present for greater than 50% of today's visit.  I performed medical decision making.  Julieanne Manson, MD  Addendum: Reviewed the peripheral blood smear from 05/17/2021-the platelets appear normal in number, scattered large and giant platelets.  No platelet clumps.  The neutrophils are not hypersegmented.  No blasts are seen.  I saw 1 myelocyte.  Numerous ovalocytes, few teardrops, the polychromasia is mildly increased.  No nucleated red cells.  We will add a ferritin level to the labs from 05/17/2021.  The peripheral blood smear findings could be related to vitamin B12 or iron deficiency.  Myelodysplasia is also in the differential diagnosis.  Julieanne Manson, MD

## 2021-05-18 DIAGNOSIS — E119 Type 2 diabetes mellitus without complications: Secondary | ICD-10-CM | POA: Diagnosis not present

## 2021-05-18 DIAGNOSIS — I480 Paroxysmal atrial fibrillation: Secondary | ICD-10-CM | POA: Diagnosis not present

## 2021-05-18 DIAGNOSIS — K4090 Unilateral inguinal hernia, without obstruction or gangrene, not specified as recurrent: Secondary | ICD-10-CM | POA: Diagnosis not present

## 2021-05-18 LAB — KAPPA/LAMBDA LIGHT CHAINS
Kappa free light chain: 84.6 mg/L — ABNORMAL HIGH (ref 3.3–19.4)
Kappa, lambda light chain ratio: 2.31 — ABNORMAL HIGH (ref 0.26–1.65)
Lambda free light chains: 36.6 mg/L — ABNORMAL HIGH (ref 5.7–26.3)

## 2021-05-18 LAB — FERRITIN: Ferritin: 23 ng/mL — ABNORMAL LOW (ref 24–336)

## 2021-05-18 NOTE — Addendum Note (Signed)
Addended by: Betsy Coder B on: 05/18/2021 08:32 AM   Modules accepted: Orders

## 2021-05-22 ENCOUNTER — Encounter: Payer: Self-pay | Admitting: Cardiology

## 2021-05-22 LAB — MULTIPLE MYELOMA PANEL, SERUM
Albumin SerPl Elph-Mcnc: 3.5 g/dL (ref 2.9–4.4)
Albumin/Glob SerPl: 1.2 (ref 0.7–1.7)
Alpha 1: 0.2 g/dL (ref 0.0–0.4)
Alpha2 Glob SerPl Elph-Mcnc: 0.6 g/dL (ref 0.4–1.0)
B-Globulin SerPl Elph-Mcnc: 1 g/dL (ref 0.7–1.3)
Gamma Glob SerPl Elph-Mcnc: 1.1 g/dL (ref 0.4–1.8)
Globulin, Total: 3 g/dL (ref 2.2–3.9)
IgA: 221 mg/dL (ref 61–437)
IgG (Immunoglobin G), Serum: 1245 mg/dL (ref 603–1613)
IgM (Immunoglobulin M), Srm: 57 mg/dL (ref 15–143)
Total Protein ELP: 6.5 g/dL (ref 6.0–8.5)

## 2021-05-24 ENCOUNTER — Encounter: Payer: Self-pay | Admitting: Adult Health

## 2021-05-24 ENCOUNTER — Other Ambulatory Visit: Payer: Self-pay

## 2021-05-24 ENCOUNTER — Ambulatory Visit: Payer: Medicare HMO | Admitting: Adult Health

## 2021-05-24 VITALS — BP 117/80 | HR 93 | Ht 75.0 in | Wt 236.4 lb

## 2021-05-24 DIAGNOSIS — Z9989 Dependence on other enabling machines and devices: Secondary | ICD-10-CM

## 2021-05-24 DIAGNOSIS — G4733 Obstructive sleep apnea (adult) (pediatric): Secondary | ICD-10-CM | POA: Diagnosis not present

## 2021-05-24 NOTE — Progress Notes (Signed)
PATIENT: Manuel [name] DOB: 26-Dec-1945  REASON FOR VISIT: follow up HISTORY FROM: patient   HISTORY OF PRESENT ILLNESS: Today 05/24/21 Manuel [name] is a 75 y.o. male with a history of OSA on CPAP. He is here today for a follow up. He endorses that he is not sleeping well and has extreme levels of fatigue. His download reports that he is wearing his machine 100% of the time and for >4 hours each night. He has a set pressure setting of 8 cmH20 and has a poor treatment of AHI 28.4. In 2020, his pressure setting was also at 8 cmH20 and had good treatment of AHI. Patient has not had a mask refitting recently but he has no complaints on how his nasal pillows are fitting currently. He is changing his nasal pillows out monthly      HISTORY  05/24/20: Manuel [name] is a 75 year old male with a history of obstructive sleep apnea on CPAP.  He returns today for follow-up.  Unfortunately we were unable to obtain a download from [facility] today we have called them twice but still have not received the fax.  The patient states that he uses CPAP nightly.  Reports that last year during the last 1 he went without the CPAP and he can tell the difference.  He denies any new issues.  Returns today for an evaluation.   03/09/19: Manuel [name] is a 75 year old male with a history of obstructive sleep apnea on CPAP.  He returns today for follow-up.  His download indicates that he uses machine nightly for compliance of 100%.  He uses machine greater than 4 hours each night.  On average he uses his machine 7 hours and 52 minutes.  His residual AHI is 2.8 on 8 cm of water with EPR of 2.  His leak in the 95th percentile is 29.4 L/min.  Overall he reports that he is doing well.  Denies any new issues.    REVIEW OF SYSTEMS: Out of a complete 14 system review of symptoms, the patient complains only of the following symptoms, and all other reviewed systems are negative.  FSS 49 ESS 12  ALLERGIES: Allergies  Allergen  Reactions   Sulfa Antibiotics Shortness Of Breath    Headaches     HOME MEDICATIONS: Outpatient Medications Prior to Visit  Medication Sig Dispense Refill   acetaminophen (TYLENOL) 325 MG tablet      acetaminophen (TYLENOL) 500 MG tablet Take 500 mg by mouth every 6 (six) hours as needed for moderate pain.     D3-50 1.25 MG (50000 UT) capsule Take 50,000 Units by mouth once a week.     diltiazem (CARDIZEM CD) 120 MG 24 hr capsule Take 120 mg by mouth 2 (two) times daily.   5   doxazosin (CARDURA) 8 MG tablet Take 8 mg by mouth at bedtime.      ELIQUIS 5 MG TABS tablet TAKE 1 TABLET BY MOUTH TWICE A DAY 180 tablet 3   FARXIGA 10 MG TABS tablet Take 10 mg by mouth daily.     ferrous sulfate 325 (65 FE) MG tablet Take 325 mg by mouth 2 (two) times daily with a meal.     furosemide (LASIX) 40 MG tablet Take 40 mg by mouth every morning. (Patient not taking: No sig reported)     glimepiride (AMARYL) 1 MG tablet Take 1 mg by mouth daily with breakfast.     lisinopril (PRINIVIL,ZESTRIL) 20 MG tablet Take 20 mg by mouth  at bedtime.      metFORMIN (GLUCOPHAGE) 500 MG tablet Take 500 mg by mouth 2 (two) times daily with a meal.   5   pravastatin (PRAVACHOL) 20 MG tablet Take 20 mg by mouth every evening.      vitamin B-12 (CYANOCOBALAMIN) 250 MCG tablet Take 1,000 mcg by mouth 2 (two) times daily.     No facility-administered medications prior to visit.    PAST MEDICAL HISTORY: Past Medical History:  Diagnosis Date   AAA (abdominal aortic aneurysm) ([place])    per cardiologist note dated 03-14-2018 last duplex , 30-28m  (dr gEinar Gip   Acute GI bleeding 12/19/2017   Allergic rhinitis, seasonal    Anemia secondary to renal failure    Anticoagulant long-term use    ELIQUIS   Arthritis    LOWER BACK   B12 deficiency    BPH (benign prostatic hyperplasia)    CKD (chronic kidney disease), stage III ([place]    Colonic stricture ([place] 12/13/2017   [place]ostomy present ([place]    Congenital absence of left  kidney [facility] det[facility]   per CT 12-11-2017 and 01-02-2018   Duodenal ulcer 12/20/2017   Dyslipidemia    Esophageal candidiasis ([place] 12/19/2017   Gastric ulcer without hemorrhage or perforation 12/20/2017   History of bowel resection 12/13/2017   for sigmoid stricture-- emergency sigmoidectomy   Hypercholesteremia 09/19/2018   Hyperparathyroidism, secondary renal ([place]    Hypertension    Left carotid bruit    OSA on CPAP    followed by [facility]   Permanent atrial fibrillation ([facility] 2017   cardiologist-  dr gEinar Gip  Solitary right kidney    Type 2 diabetes mellitus ([facility]    followed by pcp   Wears glasses    Wears partial dentures    UPPER AND LOWER    PAST SURGICAL HISTORY: Past Surgical History:  Procedure Laterality Date   BIOPSY  12/19/2017   Procedure: BIOPSY;  Surgeon: HCarol Ada MD;  [facility];  Service: Endoscopy;;   CARDIOVASCULAR STRESS TEST  11/16/2011   low risk nuclear study w/ no ischemia/  normal LV function and wall motion , ef 60%   CARDIOVERSION  12/18/2011   Procedure: CARDIOVERSION;  Surgeon: JLaverda Page MD;  [facility]: [facility]  Service: Cardiovascular;  Laterality: N/A;   CATARACT EXTRACTION W/ INTRAOCULAR LENS IMPLANT Right 2015   COLON RESECTION N/A 12/13/2017   Procedure: LAPAROSCOPIC END COLOSTOMY HARTMAN'S PROCEDURE;  Surgeon: TLeighton Ruff MD;  [facility];  Service: General;  Laterality: N/A;   COLOSTOMY TAKEDOWN N/A 05/29/2018   Procedure: LAPAROSCOPIC COLOSTOMY REVERSAL ERAS PATHWAY, RIGID PROCTOSCOPY;  Surgeon: TLeighton Ruff MD;  [facility];  Service: General;  Laterality: N/A;   CYST REMOVED FROM UPPER BACK  09/2017   BENIGN   ESOPHAGOGASTRODUODENOSCOPY N/A 12/19/2017   Procedure: ESOPHAGOGASTRODUODENOSCOPY (EGD);  Surgeon: HCarol Ada MD;  [facility];  Service: Endoscopy;  Laterality: N/A;   FLEXIBLE SIGMOIDOSCOPY N/A 12/13/2017   Procedure:  FLEXIBLE SIGMOIDOSCOPY;  Surgeon: HCarol Ada MD;  [facility];  Service: Endoscopy;  Laterality: N/A;   INGUINAL HERNIA REPAIR Left 1980s   AND REMOVAL CYST ON UPPER BACK   PAROTIDECTOMY Right 09-21-1999   dr bJanace Hoard_0    salivery gland mass  (benign warthin's tumor)   TRANSTHORACIC ECHOCARDIOGRAM  01/04/2018   mild LVH, ef 509-62% grade 2 diastolic function/  trivial AR and MR/  mild LAE/  mild to moderate  TR    FAMILY HISTORY: Family History  Problem Relation Age of Onset   Cancer Father    Alzheimer's disease Father    Cancer Mother    Kidney disease Paternal Aunt     SOCIAL HISTORY: Social History   Socioeconomic History   Marital status: Married    Spouse name: Not on file   Number of children: 1   Years of education: Not on file   Highest education level: Not on file  Occupational History   Occupation: Theme park manager    Comment: [employer]  Tobacco Use   Smoking status: Former    Packs/day: 1.00    Years: 30.00    Pack years: 30.00    Types: Cigarettes    Quit date: 01/20/2015    Years since quitting: 6.3   Smokeless tobacco: Never  Vaping Use   Vaping Use: Never used  Substance and Sexual Activity   Alcohol use: No    Alcohol/week: 0.0 standard drinks   Drug use: No   Sexual activity: Not on file  Other Topics Concern   Not on file  Social History Narrative   Drinks 2-3 caffeine drinks a day    Social Determinants of Health   Financial Resource Strain: Not on file  Food Insecurity: Not on file  Transportation Needs: Not on file  Physical Activity: Not on file  Stress: Not on file  Social Connections: Not on file  Intimate Partner Violence: Not on file      PHYSICAL EXAM  There were no vitals filed for this visit. There is no height or weight on file to calculate BMI.  Generalized: Well developed, in no acute distress  Chest: Lungs clear to auscultation on the right and expiratory wheezes heard on the left.   Neurological examination   Mentation: Alert oriented to time, place, history taking. Follows all commands speech and language fluent Cranial nerve II-XII: Extraocular movements were full, visual field were full on confrontational test Head turning and shoulder shrug  were normal and symmetric. Motor: The motor testing reveals 5 over 5 strength of all 4 extremities. Good symmetric motor tone is noted throughout.  Sensory: Sensory testing is intact to soft touch on all 4 extremities. No evidence of extinction is noted.  Gait and station: Gait is normal.    DIAGNOSTIC DATA (LABS, IMAGING, TESTING) - I reviewed patient records, labs, notes, testing and imaging myself where available.  Lab Results  Component Value Date   WBC 3.4 (L) 05/15/2021   HGB 13.8 05/15/2021   HCT 46.7 05/15/2021   MCV 96.5 05/15/2021   PLT 147 (L) 05/15/2021      Component Value Date/Time   NA 141 05/17/2021 1306   K 4.2 05/17/2021 1306   CL 101 05/17/2021 1306   CO2 35 (H) 05/17/2021 1306   GLUCOSE 86 05/17/2021 1306   BUN 16 05/17/2021 1306   CREATININE 1.27 (H) 05/17/2021 1306   CREATININE 1.23 04/10/2011 1307   CALCIUM 9.0 05/17/2021 1306   PROT 6.8 05/17/2021 1306   ALBUMIN 3.8 05/17/2021 1306   AST 14 (L) 05/17/2021 1306   ALT 12 05/17/2021 1306   ALKPHOS 82 05/17/2021 1306   BILITOT 1.2 05/17/2021 1306   GFRNONAA 59 (L) 05/17/2021 1306   GFRAA >60 06/01/2018 0407   No results found for: CHOL, HDL, LDLCALC, LDLDIRECT, TRIG, CHOLHDL Lab Results  Component Value Date   HGBA1C 6.5 (H) 05/21/2018   Lab Results  Component Value Date   VITAMINB12 127 (L)  12/19/2017   No results found for: TSH    ASSESSMENT AND PLAN 75 y.o. year old male  has a past medical history of AAA (abdominal aortic aneurysm) ([place]), Acute GI bleeding (12/19/2017), Allergic rhinitis, seasonal, Anemia secondary to renal failure, Anticoagulant long-term use, Arthritis, B12 deficiency, BPH (benign prostatic hyperplasia), CKD (chronic kidney disease),  stage III ([place]), Colonic stricture ([place]) (12/13/2017), Colostomy present ([place]), Congenital absence of left kidney ([facility] dr detarding), Duodenal ulcer (12/20/2017), Dyslipidemia, Esophageal candidiasis ([place]) (12/19/2017), Gastric ulcer without hemorrhage or perforation (12/20/2017), History of bowel resection (12/13/2017), Hypercholesteremia (09/19/2018), Hyperparathyroidism, secondary renal ([place]), Hypertension, Left carotid bruit, OSA on CPAP, Permanent atrial fibrillation ([place]) (2017), Solitary right kidney, Type 2 diabetes mellitus ([place]), Wears glasses, and Wears partial dentures. here with:  OSA on CPAP  - CPAP compliance excellent - Poor treatment of AHI  - Change pressure setting to auto titration 5-15 cmH2O - Encourage patient to use CPAP nightly and > 4 hours each night - F/U in 3 months or sooner if needed   Ward Givens, MSN, NP-C 05/24/2021, 11:18 AM [facility] [address] [place],  [address] [phone]

## 2021-05-25 NOTE — Progress Notes (Signed)
Fax confirmation recevied [facility] for new cpap orders.  [phone].

## 2021-06-02 DIAGNOSIS — G4733 Obstructive sleep apnea (adult) (pediatric): Secondary | ICD-10-CM | POA: Diagnosis not present

## 2021-06-06 ENCOUNTER — Ambulatory Visit: Payer: Medicare HMO | Admitting: Cardiology

## 2021-06-06 ENCOUNTER — Other Ambulatory Visit: Payer: Self-pay

## 2021-06-06 ENCOUNTER — Encounter: Payer: Self-pay | Admitting: Cardiology

## 2021-06-06 ENCOUNTER — Other Ambulatory Visit: Payer: Self-pay | Admitting: Cardiology

## 2021-06-06 VITALS — BP 137/87 | HR 82 | Temp 97.8°F | Resp 16 | Ht 75.0 in | Wt 229.0 lb

## 2021-06-06 DIAGNOSIS — I2781 Cor pulmonale (chronic): Secondary | ICD-10-CM

## 2021-06-06 DIAGNOSIS — J441 Chronic obstructive pulmonary disease with (acute) exacerbation: Secondary | ICD-10-CM

## 2021-06-06 DIAGNOSIS — I4821 Permanent atrial fibrillation: Secondary | ICD-10-CM | POA: Diagnosis not present

## 2021-06-06 DIAGNOSIS — I5031 Acute diastolic (congestive) heart failure: Secondary | ICD-10-CM | POA: Diagnosis not present

## 2021-06-06 DIAGNOSIS — J9621 Acute and chronic respiratory failure with hypoxia: Secondary | ICD-10-CM | POA: Diagnosis not present

## 2021-06-06 MED ORDER — FUROSEMIDE 40 MG PO TABS: 40.0000 mg | ORAL_TABLET | ORAL | 0 refills | Status: DC

## 2021-06-06 MED ORDER — POTASSIUM CHLORIDE ER 10 MEQ PO TBCR: 20.0000 meq | EXTENDED_RELEASE_TABLET | Freq: Every day | ORAL | 2 refills | Status: DC | PRN

## 2021-06-06 MED ORDER — ALBUTEROL SULFATE HFA 108 (90 BASE) MCG/ACT IN AERS: 2.0000 | INHALATION_SPRAY | Freq: Four times a day (QID) | RESPIRATORY_TRACT | 2 refills | Status: DC | PRN

## 2021-06-06 NOTE — Progress Notes (Signed)
Primary Physician/Referring:  [name], DO  Patient ID: Manuel [name], male    DOB: 1945-10-29, 75 y.o.   MRN: [id number]  Chief Complaint  Patient presents with   Edema   Atrial Fibrillation   HPI:    Manuel [name]  is a 75 y.o. AAM patient with permanent atrial fibrillation, hypertension, diabetes mellitus with peripheral arterial disease, ongoing tobacco use disorder with underlying COPD, obstructive sleep apnea, severe presently on CPAP and compliant with CPAP, He had colonic stricture and pneumatosis leading to obstruction on 12/13/2017 and had colostomy which was reversed presents here for an urgent visit due to worsening dyspnea on exertion, fatigue and worsening leg edema over the past 4 to 6 weeks.  Complains of marked dyspnea on exertion even with minimal activity, orthopnea and also marked leg edema that is all new over the past 4 to 6 weeks.  States that he is unable to do anything without having marked dyspnea.  Unfortunately still smoking.  Past Medical History:  Diagnosis Date   AAA (abdominal aortic aneurysm)    per cardiologist note dated 03-14-2018 last duplex , 30-47m  (dr gEinar Gip   Acute GI bleeding 12/19/2017   Allergic rhinitis, seasonal    Anemia secondary to renal failure    Anticoagulant long-term use    ELIQUIS   Arthritis    LOWER BACK   B12 deficiency    BPH (benign prostatic hyperplasia)    CKD (chronic kidney disease), stage III ([facility]    [place] ([facility] 12/13/2017   Colostomy present ([facility]    Congenital absence of left kidney [facility] dr detarding   per CT 12-11-2017 and 01-02-2018   Duodenal ulcer 12/20/2017   Dyslipidemia    Esophageal candidiasis ([facility] 12/19/2017   Gastric ulcer without hemorrhage or perforation 12/20/2017   History of bowel resection 12/13/2017   for sigmoid stricture-- emergency sigmoidectomy   Hypercholesteremia 09/19/2018   Hyperparathyroidism, secondary renal ([facility]     Hypertension    Left carotid bruit    OSA on CPAP    followed by [facility]   Permanent atrial fibrillation ([facility] 2017   cardiologist-  dr gEinar Gip  Solitary right kidney    Type 2 diabetes mellitus ([facility]    followed by pcp   Wears glasses    Wears partial dentures    UPPER AND LOWER   Past Surgical History:  Procedure Laterality Date   BIOPSY  12/19/2017   Procedure: BIOPSY;  Surgeon: HCarol Ada MD;  [facility]: [facility];  Service: Endoscopy;;   CARDIOVASCULAR STRESS TEST  11/16/2011   low risk nuclear study w/ no ischemia/  normal LV function and wall motion , ef 60%   CARDIOVERSION  12/18/2011   Procedure: CARDIOVERSION;  Surgeon: JLaverda Page MD;  [facility]  Service: Cardiovascular;  Laterality: N/A;   CATARACT EXTRACTION W/ INTRAOCULAR LENS IMPLANT Right 2015   COLON RESECTION N/A 12/13/2017   Procedure: LAPAROSCOPIC END COLOSTOMY HARTMAN'S PROCEDURE;  Surgeon: TLeighton Ruff MD;  Location: [facility];  Service: General;  Laterality: N/A;   COLOSTOMY TAKEDOWN N/A 05/29/2018   Procedure: LAPAROSCOPIC COLOSTOMY REVERSAL ERAS PATHWAY, RIGID PROCTOSCOPY;  Surgeon: TLeighton Ruff MD;  Location: [facility];  Service: General;  Laterality: N/A;   CYST REMOVED FROM UPPER BACK  09/2017   BENIGN   ESOPHAGOGASTRODUODENOSCOPY N/A 12/19/2017   Procedure: ESOPHAGOGASTRODUODENOSCOPY (EGD);  Surgeon: HCarol Ada MD;  [facility];  Service: Endoscopy;  Laterality: N/A;  FLEXIBLE SIGMOIDOSCOPY N/A 12/13/2017   Procedure: FLEXIBLE SIGMOIDOSCOPY;  Surgeon: Carol Ada, MD;  Location: [facility];  Service: Endoscopy;  Laterality: N/A;   INGUINAL HERNIA REPAIR Left 1980s   AND REMOVAL CYST ON UPPER BACK   PAROTIDECTOMY Right 09-21-1999   dr Janace Hoard _0    salivery gland mass  (benign warthin's tumor)   TRANSTHORACIC ECHOCARDIOGRAM  01/04/2018   mild LVH, ef 83-15%, grade 2 diastolic function/  trivial AR and MR/  mild LAE/  mild to moderate TR   Family History   Problem Relation Age of Onset   Cancer Mother    Cancer Father    Alzheimer's disease Father    Kidney disease Paternal Aunt    Sleep apnea Neg Hx     Social History   Tobacco Use   Smoking status: Every Day    Packs/day: 0.50    Years: 30.00    Pack years: 15.00    Types: Cigarettes   Smokeless tobacco: Never   Tobacco comments:    Patient restarted smoking but can't remember when he restarted.  Substance Use Topics   Alcohol use: No    Alcohol/week: 0.0 standard drinks   ROS  Review of Systems  Constitutional: Positive for malaise/fatigue.  Cardiovascular:  Positive for dyspnea on exertion, leg swelling and paroxysmal nocturnal dyspnea. Negative for chest pain.  Respiratory:  Positive for snoring (OSA on CPAP).   Gastrointestinal:  Negative for melena.  Objective  Blood pressure 137/87, pulse 82, temperature 97.8 F (36.6 C), temperature source Temporal, resp. rate 16, height _1  (1.905 m), weight 229 lb (103.9 kg), SpO2 91 %.   SpO2: 91 %   Vitals with BMI 06/06/2021 05/24/2021 05/17/2021  Height _2  _3  _4   Weight 229 lbs 236 lbs 6 oz 233 lbs 10 oz  BMI 28.62 17.61 60.7  Systolic 371 062 694  Diastolic 87 80 81  Pulse 82 93 79     Physical Exam Neck:     Vascular: JVD present. No carotid bruit.  Cardiovascular:     Rate and Rhythm: Rhythm irregularly irregular.     Pulses:          Dorsalis pedis pulses are 0 on the right side and 0 on the left side.       Posterior tibial pulses are 0 on the right side and 0 on the left side.     Heart sounds: Murmur heard.  High-pitched blowing midsystolic murmur is present with a grade of 3/6 at the lower right sternal border and apex.    No gallop.  Pulmonary:     Effort: Pulmonary effort is normal.     Breath sounds: Normal breath sounds.  Abdominal:     General: Bowel sounds are normal.     Palpations: Abdomen is soft.  Musculoskeletal:        General: No swelling.     Right lower leg: Edema (3+  bilateral below-knee pitting leg edema) present.     Left lower leg: Edema (3+ bilateral below-knee pitting leg edema) present.  Skin:    General: Skin is warm.     Capillary Refill: Capillary refill takes 2 to 3 seconds.   Laboratory examination:    External labs:   Labs 05/02/2021:  TSH normal at 1.53.  Vitamin D markedly reduced at 7.4.  B12 510.  A1c 6.7%.  Serum glucose 100 mg, BUN 13, creatinine 1.37, EGFR 58 mL, potassium 4.1.  Hb 13.4/HCT 44.0, platelets 162.  Urine analysis normal.  Total cholesterol 123, triglycerides 61, HDL 46, LDL 65.  Labs 05/20/2020: Hb 15.3/HCT 45.5, platelets 149.  Sodium 147, potassium 4.8, serum glucose 113, BUN 17, creatinine 1.4, EGFR 63 mL, CMP otherwise normal.  Total cholesterol 115, triglycerides 54, HDL 48, LDL 56.  TSH normal.  Labs 07/08/2019: Hb 15.3/HCT 48.6, platelets 174. Serum glucose 89 mg, BUN 14, creatinine 1.2, EGFR >60 mL, CMP normal. A1c 6.5%. Total cholesterol 115, triglycerides 48, HDL 47, LDL 58. Uric acid 6.5.  Medications and allergies   Allergies  Allergen Reactions   Sulfa Antibiotics Shortness Of Breath    Headaches     Current Outpatient Medications on File Prior to Visit  Medication Sig Dispense Refill   acetaminophen (TYLENOL) 500 MG tablet Take 500 mg by mouth every 6 (six) hours as needed for moderate pain.     D3-50 1.25 MG (50000 UT) capsule Take 50,000 Units by mouth once a week.     diltiazem (CARDIZEM CD) 120 MG 24 hr capsule Take 120 mg by mouth 2 (two) times daily.   5   doxazosin (CARDURA) 8 MG tablet Take 8 mg by mouth at bedtime.      ELIQUIS 5 MG TABS tablet TAKE 1 TABLET BY MOUTH TWICE A DAY 180 tablet 3   FARXIGA 10 MG TABS tablet Take 10 mg by mouth daily.     ferrous sulfate 325 (65 FE) MG tablet Take 325 mg by mouth 2 (two) times daily with a meal.     glimepiride (AMARYL) 1 MG tablet Take 1 mg by mouth daily with breakfast.     lisinopril (PRINIVIL,ZESTRIL) 20 MG tablet Take 20 mg  by mouth at bedtime.      metFORMIN (GLUCOPHAGE) 500 MG tablet Take 500 mg by mouth 2 (two) times daily with a meal.   5   pravastatin (PRAVACHOL) 20 MG tablet Take 20 mg by mouth every evening.      vitamin B-12 (CYANOCOBALAMIN) 250 MCG tablet Take 1,000 mcg by mouth 2 (two) times daily.     No current facility-administered medications on file prior to visit.     Radiology:   No AAA by CTA abdomen in 2019.  CXR 12/02/2017: Chronic bibasilar densities, likely scarring.  Mild cardiomegaly.  Cardiac Studies:   Exercise sestamibi 11/16/11: Gut uptake artifact. No ischemia. EF 60%.Low risk. 7.3 METs. Symptoms included Dyspnea.   Echocardiogram 01/04/2018: - Left ventricle: The cavity size was normal. Wall thickness was    increased in a pattern of mild LVH. Systolic function was normal.    The estimated ejection fraction was in the range of 50% to 55%.    Wall motion was normal; there were no regional wall motion    abnormalities. Features are consistent with a pseudonormal left    ventricular filling pattern, with concomitant abnormal relaxation    and increased filling pressure (grade 2 diastolic dysfunction).  -Aortic valve: Trace aortic regurgitation. -Left atrium is mildly dilated.  EKG:  EKG 06/06/2021: Atrial fibrillation with controlled ventricular response at rate of 76 bpm, rightward axis, anterolateral infarct old.  Single PVC.  Low-voltage complexes.  Pulmonary disease pattern.  Compared to 09/21/2020, previously leftward axis. Assessment     ICD-10-CM   1. Permanent atrial fibrillation (HCC)  I48.21 EKG 12-Lead    2. Acute on chronic respiratory failure with hypoxia (HCC)  J96.21 Brain natriuretic peptide    PCV ECHOCARDIOGRAM COMPLETE    DG Chest 2 View    3.  Cor pulmonale, chronic (HCC)  I27.81 albuterol (VENTOLIN HFA) 108 (90 Base) MCG/ACT inhaler    4. Acute diastolic heart failure (HCC)  I50.31 Brain natriuretic peptide    PCV MYOCARDIAL PERFUSION WITH LEXISCAN     furosemide (LASIX) 40 MG tablet    potassium chloride (KLOR-CON) 10 MEQ tablet    5. Chronic obstructive pulmonary disease with acute exacerbation (HCC)  J44.1      CHA2DS2-VASc Score is 4.  Yearly risk of stroke: 4.8% (A, HTN, DM).  Score of 1=0.6; 2=2.2; 3=3.2; 4=4.8; 5=7.2; 6=9.8; 7=>9.8) -(CHF; HTN; vasc disease DM,  Male = 1; Age <65 =0; 65-74 = 1,  >75 =2; stroke/embolism= 2).   Meds ordered this encounter  Medications   furosemide (LASIX) 40 MG tablet    Sig: Take 1 tablet (40 mg total) by mouth every morning.    Dispense:  30 tablet    Refill:  0   potassium chloride (KLOR-CON) 10 MEQ tablet    Sig: Take 2 tablets (20 mEq total) by mouth daily as needed (Take with furosemide only).    Dispense:  60 tablet    Refill:  2   albuterol (VENTOLIN HFA) 108 (90 Base) MCG/ACT inhaler    Sig: Inhale 2 puffs into the lungs every 6 (six) hours as needed for wheezing or shortness of breath.    Dispense:  1 each    Refill:  2    Medications Discontinued During This Encounter  Medication Reason   acetaminophen (TYLENOL) 325 MG tablet Error   furosemide (LASIX) 40 MG tablet Error   Recommendations:   Manuel [name]  is a 75 y.o. AAM patient with permanent atrial fibrillation, hypertension, diabetes mellitus with peripheral arterial disease, ongoing tobacco use disorder with underlying COPD, obstructive sleep apnea, severe presently on CPAP and compliant with CPAP presents here for an urgent visit due to worsening dyspnea on exertion, fatigue and worsening leg edema over the past 4 to 6 weeks.  Patient is severely hypoxemic, on office visit with his PCP about 4 weeks ago his oxygen saturation was also very low.  He has acute COPD exacerbation.  He is also in acute decompensated diastolic heart failure along with exacerbation of his chronic cor pulmonale.  He has a new murmur that appears to be moderate MR and TR.  2-3+ bilateral leg edema.  Elevated JVD.  Extensive rhonchi in his  lungs.  Again discussed smoking cessation.  Atrovent inhaler prescribed.  Start him on furosemide 40 mg p.o. twice daily for 1 week followed by 1 tablet once a day along with potassium replacement at 20 mEq daily.  Heart failure diet discussed with the patient and his wife at the bedside.  We will obtain chest x-ray today, BNP today and repeat BMP in 2 weeks.  He also needs ischemic work-up with regard to coronary artery disease in view of his underlying risk factors including diabetes mellitus, PAD and hypertension.    I reviewed his external labs, diabetes is well controlled, lipids are also at goal and renal function has remained stable.  He will need evaluation by pulmonary medicine soon.  Patient and his wife were advised to contact me if he does not feel well over the next few days or if he is not responding to conservative therapy.  This was a 50-minute office visit encounter with greater than 30-minute spent on face-to-face direct interaction with the patient and his wife and evaluation of external records.   Adrian Prows, MD, [facility]  06/06/2021, 4:47 PM Office: [phone] Pager: [phone]

## 2021-06-07 LAB — BRAIN NATRIURETIC PEPTIDE: BNP: 359.2 pg/mL — ABNORMAL HIGH (ref 0.0–100.0)

## 2021-06-09 ENCOUNTER — Ambulatory Visit: Payer: Medicare HMO

## 2021-06-09 ENCOUNTER — Ambulatory Visit
Admission: RE | Admit: 2021-06-09 | Discharge: 2021-06-09 | Disposition: A | Discharge status: Home / Self Care | Payer: Medicare HMO | Source: Ambulatory Visit | Attending: Cardiology | Admitting: Cardiology

## 2021-06-09 ENCOUNTER — Telehealth: Payer: Self-pay

## 2021-06-09 DIAGNOSIS — J9621 Acute and chronic respiratory failure with hypoxia: Secondary | ICD-10-CM

## 2021-06-09 DIAGNOSIS — J449 Chronic obstructive pulmonary disease, unspecified: Secondary | ICD-10-CM | POA: Diagnosis not present

## 2021-06-09 NOTE — Telephone Encounter (Signed)
Manuel [name] from [place]  Chest xray results :  Cardiomegaly with small bilateral plural effusion. Mild diffuse bilateral interstitial pulmonary opacity likely edema.

## 2021-06-11 NOTE — Progress Notes (Signed)
Chest x-ray PA and lateral view 06/09/2021: Cardiomegaly with small bilateral pleural effusions and mild, diffuse bilateral interstitial pulmonary opacity, likely edema.

## 2021-06-13 DIAGNOSIS — D508 Other iron deficiency anemias: Secondary | ICD-10-CM | POA: Diagnosis not present

## 2021-06-13 DIAGNOSIS — E559 Vitamin D deficiency, unspecified: Secondary | ICD-10-CM | POA: Diagnosis not present

## 2021-06-13 DIAGNOSIS — N401 Enlarged prostate with lower urinary tract symptoms: Secondary | ICD-10-CM | POA: Diagnosis not present

## 2021-06-13 DIAGNOSIS — E1122 Type 2 diabetes mellitus with diabetic chronic kidney disease: Secondary | ICD-10-CM | POA: Diagnosis not present

## 2021-06-13 DIAGNOSIS — E7800 Pure hypercholesterolemia, unspecified: Secondary | ICD-10-CM | POA: Diagnosis not present

## 2021-06-13 DIAGNOSIS — Z125 Encounter for screening for malignant neoplasm of prostate: Secondary | ICD-10-CM | POA: Diagnosis not present

## 2021-06-19 NOTE — Progress Notes (Signed)
Primary Physician/Referring:  [name], DO  Patient ID: Manuel [name], male    DOB: 06-26-1946, 75 y.o.   MRN: [id number]  Chief Complaint  Patient presents with   Congestive Heart Failure   Acute respiratory failure   Follow-up    2 weeks   HPI:    Manuel [name]  is a 75 y.o. AAM patient with permanent atrial fibrillation, hypertension, diabetes mellitus with peripheral arterial disease, ongoing tobacco use disorder with underlying COPD, obstructive sleep apnea, severe presently on CPAP and compliant with CPAP, He had colonic stricture and pneumatosis leading to obstruction on 12/13/2017 and had colostomy which was reversed.   Patient was last seen in our office 08/06/2018 by Dr. Einar Gip for urgent visit with worsening dyspnea and fatigue as well as edema over the previous few weeks. Dr. Einar Gip felt patient to be in acute COPD exacerbation as well as acute decompensated diastolic heart failure. At last visit Dr. Einar Gip prescribed Atrovent inhaler, furosemide 40 mg twice daily. Chest x-ray ordered at last visit noted cardiomegaly with small bilateral pleural effusions and mild, diffuse bilateral interstitial pulmonary opacity, likely edema. He now presents for follow up.  Patient's bilateral leg edema and dyspnea have significantly improved since last office visit.  His weight is down approximately 8 pounds, patient has diuresed well.  He is tolerating furosemide without issue, however he has not been using inhaler regularly.  Notably patient's BNP is elevated at 359, echo and stress test pending.  He is scheduled to see PCP tomorrow.  He had recent lab work done since initiation of furosemide by PCPs office, have requested that patient have these results sent to me for review.  Fortunately patient continues to smoke.  Past Medical History:  Diagnosis Date   AAA (abdominal aortic aneurysm)    per cardiologist note dated 03-14-2018 last duplex , 30-67m  (dr gEinar Gip   Acute GI bleeding  12/19/2017   Allergic rhinitis, seasonal    Anemia secondary to renal failure    Anticoagulant long-term use    ELIQUIS   Arthritis    LOWER BACK   B12 deficiency    BPH (benign prostatic hyperplasia)    CKD (chronic kidney disease), stage III ([facility]    Colonic stricture ([facility] 12/13/2017   Colostomy present ([facility]    Congenital absence of left kidney [facility] dr detarding   per CT 12-11-2017 and 01-02-2018   Duodenal ulcer 12/20/2017   Dyslipidemia    Esophageal candidiasis ([facility] 12/19/2017   Gastric ulcer without hemorrhage or perforation 12/20/2017   History of bowel resection 12/13/2017   for sigmoid stricture-- emergency sigmoidectomy   Hypercholesteremia 09/19/2018   Hyperparathyroidism, secondary renal ([facility]    Hypertension    Left carotid bruit    OSA on CPAP    followed by [facility]   Permanent atrial fibrillation ([facility] 2017   cardiologist-  dr gEinar Gip  Solitary right kidney    Type 2 diabetes mellitus ([facility]    followed by pcp   Wears glasses    Wears partial dentures    UPPER AND LOWER   Past Surgical History:  Procedure Laterality Date   BIOPSY  12/19/2017   Procedure: BIOPSY;  Surgeon: HCarol Ada MD;  Location: [facility];  Service: Endoscopy;;   CARDIOVASCULAR STRESS TEST  11/16/2011   low risk nuclear study w/ no ischemia/  normal LV function and wall motion , ef 60%   CARDIOVERSION  12/18/2011  Procedure: CARDIOVERSION;  Surgeon: Laverda Page, MD;  Location: [facility];  Service: Cardiovascular;  Laterality: N/A;   CATARACT EXTRACTION W/ INTRAOCULAR LENS IMPLANT Right 2015   COLON RESECTION N/A 12/13/2017   Procedure: LAPAROSCOPIC END COLOSTOMY HARTMAN'S PROCEDURE;  Surgeon: Leighton Ruff, MD;  Location: [facility];  Service: General;  Laterality: N/A;   COLOSTOMY TAKEDOWN N/A 05/29/2018   Procedure: LAPAROSCOPIC COLOSTOMY REVERSAL ERAS PATHWAY, RIGID PROCTOSCOPY;  Surgeon: Leighton Ruff, MD;  Location: [facility];   Service: General;  Laterality: N/A;   CYST REMOVED FROM UPPER BACK  09/2017   BENIGN   ESOPHAGOGASTRODUODENOSCOPY N/A 12/19/2017   Procedure: ESOPHAGOGASTRODUODENOSCOPY (EGD);  Surgeon: Carol Ada, MD;  Location: [facility];  Service: Endoscopy;  Laterality: N/A;   FLEXIBLE SIGMOIDOSCOPY N/A 12/13/2017   Procedure: FLEXIBLE SIGMOIDOSCOPY;  Surgeon: Carol Ada, MD;  Location: [facility];  Service: Endoscopy;  Laterality: N/A;   INGUINAL HERNIA REPAIR Left 1980s   AND REMOVAL CYST ON UPPER BACK   PAROTIDECTOMY Right 09-21-1999   dr Janace Hoard _0    salivery gland mass  (benign warthin's tumor)   TRANSTHORACIC ECHOCARDIOGRAM  01/04/2018   mild LVH, ef 95-63%, grade 2 diastolic function/  trivial AR and MR/  mild LAE/  mild to moderate TR   Family History  Problem Relation Age of Onset   Cancer Mother    Cancer Father    Alzheimer's disease Father    Kidney disease Paternal Aunt    Sleep apnea Neg Hx     Social History   Tobacco Use   Smoking status: Former    Packs/day: 0.50    Years: 30.00    Pack years: 15.00    Types: Cigarettes   Smokeless tobacco: Never   Tobacco comments:    Patient stated the he quit smoking again right after last OV.  Substance Use Topics   Alcohol use: No    Alcohol/week: 0.0 standard drinks   ROS  Review of Systems  Constitutional: Negative for malaise/fatigue and weight gain.  Cardiovascular:  Positive for dyspnea on exertion (improved), leg swelling (improved) and paroxysmal nocturnal dyspnea. Negative for chest pain, claudication, near-syncope, orthopnea, palpitations and syncope.  Respiratory:  Positive for snoring (OSA on CPAP). Negative for shortness of breath.   Gastrointestinal:  Negative for melena.  Neurological:  Negative for dizziness.  Objective  Blood pressure 103/66, pulse 99, temperature 98.1 F (36.7 C), temperature source Temporal, resp. rate 16, height 6' 3" (1.905 m), weight 221 lb 12.8 oz (100.6 kg), SpO2 96 %.   SpO2:  96 %   Vitals with BMI 06/20/2021 06/06/2021 05/24/2021  Height 6' 3" 6' 3" 6' 3"  Weight 221 lbs 13 oz 229 lbs 236 lbs 6 oz  BMI 27.72 87.56 43.32  Systolic 951 884 166  Diastolic 66 87 80  Pulse 99 82 93     Physical Exam Vitals reviewed.  Neck:     Vascular: JVD present. No carotid bruit.  Cardiovascular:     Rate and Rhythm: Rhythm irregularly irregular.     Pulses:          Dorsalis pedis pulses are 0 on the right side and 0 on the left side.       Posterior tibial pulses are 0 on the right side and 0 on the left side.     Heart sounds: Murmur heard.  High-pitched blowing midsystolic murmur is present with a grade of 3/6 at the lower right sternal border and apex.    No gallop.  Pulmonary:     Effort: Pulmonary effort is normal.     Breath sounds: Wheezing (bilaterally) present.  Abdominal:     General: Bowel sounds are normal.     Palpations: Abdomen is soft.  Musculoskeletal:     Right lower leg: Edema (1+ pitting to knee) present.     Left lower leg: Edema (1+ pitting to knee) present.   Laboratory examination:    CMP Latest Ref Rng & Units 05/17/2021 06/01/2018 05/31/2018  Glucose 70 - 99 mg/dL 86 108(H) 82  BUN 8 - 23 mg/dL _0 Creatinine 0.61 - 1.24 mg/dL 1.27(H) 0.99 1.05  Sodium 135 - 145 mmol/L 141 139 140  Potassium 3.5 - 5.1 mmol/L 4.2 4.1 4.2  Chloride 98 - 111 mmol/L 101 103 105  CO2 22 - 32 mmol/L 35(H) 30 31  Calcium 8.9 - 10.3 mg/dL 9.0 8.3(L) 8.4(L)  Total Protein 6.5 - 8.1 g/dL 6.8 - -  Total Bilirubin 0.3 - 1.2 mg/dL 1.2 - -  Alkaline Phos 38 - 126 U/L 82 - -  AST 15 - 41 U/L 14(L) - -  ALT 0 - 44 U/L 12 - -   CBC Latest Ref Rng & Units 05/15/2021 06/02/2018 06/01/2018  WBC 4.0 - 10.5 K/uL 3.4(L) 5.1 5.5  Hemoglobin 13.0 - 17.0 g/dL 13.8 11.1(L) 11.1(L)  Hematocrit 39.0 - 52.0 % 46.7 38.8(L) 38.2(L)  Platelets 150 - 400 K/uL 147(L) 222 193   Lipid Panel  No results found for: CHOL, TRIG, HDL, CHOLHDL, VLDL, LDLCALC,  LDLDIRECT HEMOGLOBIN A1C Lab Results  Component Value Date   HGBA1C 6.5 (H) 05/21/2018   MPG 139.85 05/21/2018   TSH No results for input(s): TSH in the last 8760 hours.  External labs:  05/02/2021: TSH normal at 1.53.  Vitamin D markedly reduced at 7.4.  B12 510.  A1c 6.7%. Serum glucose 100 mg, BUN 13, creatinine 1.37, EGFR 58 mL, potassium 4.1. Hb 13.4/HCT 44.0, platelets 162. Urine analysis normal. Total cholesterol 123, triglycerides 61, HDL 46, LDL 65.  05/20/2020:  Hb 15.3/HCT 45.5, platelets 149. Sodium 147, potassium 4.8, serum glucose 113, BUN 17, creatinine 1.4, EGFR 63 mL, CMP otherwise normal. Total cholesterol 115, triglycerides 54, HDL 48, LDL 56. TSH normal.  07/08/2019:  Hb 15.3/HCT 48.6, platelets 174. Serum glucose 89 mg, BUN 14, creatinine 1.2, EGFR >60 mL, CMP normal. A1c 6.5%. Total cholesterol 115, triglycerides 48, HDL 47, LDL 58. Uric acid 6.5.   Allergies   Allergies  Allergen Reactions   Sulfa Antibiotics Shortness Of Breath    Headaches       Medications Prior to Visit:   Outpatient Medications Prior to Visit  Medication Sig Dispense Refill   acetaminophen (TYLENOL) 500 MG tablet Take 500 mg by mouth every 6 (six) hours as needed for moderate pain.     albuterol (VENTOLIN HFA) 108 (90 Base) MCG/ACT inhaler Inhale 2 puffs into the lungs every 6 (six) hours as needed for wheezing or shortness of breath. 1 each 2   D3-50 1.25 MG (50000 UT) capsule Take 50,000 Units by mouth once a week.     diltiazem (CARDIZEM CD) 120 MG 24 hr capsule Take 120 mg by mouth 2 (two) times daily.   5   doxazosin (CARDURA) 8 MG tablet Take 8 mg by mouth at bedtime.      ELIQUIS 5 MG TABS tablet TAKE 1 TABLET BY MOUTH TWICE A DAY 180 tablet 3   FARXIGA 10 MG TABS tablet Take  10 mg by mouth daily.     ferrous sulfate 325 (65 FE) MG tablet Take 325 mg by mouth 2 (two) times daily with a meal.     furosemide (LASIX) 40 MG tablet Take 1 tablet (40 mg total) by mouth  every morning. 30 tablet 0   lisinopril (PRINIVIL,ZESTRIL) 20 MG tablet Take 20 mg by mouth at bedtime.      metFORMIN (GLUCOPHAGE) 500 MG tablet Take 500 mg by mouth 2 (two) times daily with a meal.   5   potassium chloride (KLOR-CON) 10 MEQ tablet Take 2 tablets (20 mEq total) by mouth daily as needed (Take with furosemide only). 60 tablet 2   pravastatin (PRAVACHOL) 20 MG tablet Take 20 mg by mouth every evening.      vitamin B-12 (CYANOCOBALAMIN) 250 MCG tablet Take 1,000 mcg by mouth 2 (two) times daily.     glimepiride (AMARYL) 1 MG tablet Take 1 mg by mouth daily with breakfast.     No facility-administered medications prior to visit.   Final Medications at End of Visit    Current Meds  Medication Sig   acetaminophen (TYLENOL) 500 MG tablet Take 500 mg by mouth every 6 (six) hours as needed for moderate pain.   albuterol (VENTOLIN HFA) 108 (90 Base) MCG/ACT inhaler Inhale 2 puffs into the lungs every 6 (six) hours as needed for wheezing or shortness of breath.   D3-50 1.25 MG (50000 UT) capsule Take 50,000 Units by mouth once a week.   diltiazem (CARDIZEM CD) 120 MG 24 hr capsule Take 120 mg by mouth 2 (two) times daily.    doxazosin (CARDURA) 8 MG tablet Take 8 mg by mouth at bedtime.    ELIQUIS 5 MG TABS tablet TAKE 1 TABLET BY MOUTH TWICE A DAY   FARXIGA 10 MG TABS tablet Take 10 mg by mouth daily.   ferrous sulfate 325 (65 FE) MG tablet Take 325 mg by mouth 2 (two) times daily with a meal.   furosemide (LASIX) 40 MG tablet Take 1 tablet (40 mg total) by mouth every morning.   lisinopril (PRINIVIL,ZESTRIL) 20 MG tablet Take 20 mg by mouth at bedtime.    metFORMIN (GLUCOPHAGE) 500 MG tablet Take 500 mg by mouth 2 (two) times daily with a meal.    potassium chloride (KLOR-CON) 10 MEQ tablet Take 2 tablets (20 mEq total) by mouth daily as needed (Take with furosemide only).   pravastatin (PRAVACHOL) 20 MG tablet Take 20 mg by mouth every evening.    vitamin B-12 (CYANOCOBALAMIN) 250  MCG tablet Take 1,000 mcg by mouth 2 (two) times daily.   Radiology:   No AAA by CTA abdomen in 2019.  CXR 12/02/2017: Chronic bibasilar densities, likely scarring.  Mild cardiomegaly.  Cardiac Studies:   Exercise sestamibi 11/16/11: Gut uptake artifact. No ischemia. EF 60%.Low risk. 7.3 METs. Symptoms included Dyspnea.   Echocardiogram 01/04/2018: - Left ventricle: The cavity size was normal. Wall thickness was    increased in a pattern of mild LVH. Systolic function was normal.    The estimated ejection fraction was in the range of 50% to 55%.    Wall motion was normal; there were no regional wall motion    abnormalities. Features are consistent with a pseudonormal left    ventricular filling pattern, with concomitant abnormal relaxation    and increased filling pressure (grade 2 diastolic dysfunction).  -Aortic valve: Trace aortic regurgitation. -Left atrium is mildly dilated.  EKG  06/06/2021: Atrial fibrillation  with controlled ventricular response at rate of 76 bpm, rightward axis, anterolateral infarct old.  Single PVC.  Low-voltage complexes.  Pulmonary disease pattern.  Compared to 09/21/2020, previously leftward axis. Assessment     ICD-10-CM   1. Acute on chronic respiratory failure with hypoxia (HCC)  J96.21     2. Cor pulmonale, chronic (HCC)  I27.81     3. Essential hypertension  I10      CHA2DS2-VASc Score is 4.  Yearly risk of stroke: 4.8% (A, HTN, DM).  Score of 1=0.6; 2=2.2; 3=3.2; 4=4.8; 5=7.2; 6=9.8; 7=>9.8) -(CHF; HTN; vasc disease DM,  Male = 1; Age <65 =0; 65-74 = 1,  >75 =2; stroke/embolism= 2).   No orders of the defined types were placed in this encounter.   Medications Discontinued During This Encounter  Medication Reason   glimepiride (AMARYL) 1 MG tablet    Recommendations:   FRANKIE SCIPIO  is a 75 y.o. AAM patient with permanent atrial fibrillation, hypertension, diabetes mellitus with peripheral arterial disease, ongoing tobacco use  disorder with underlying COPD, obstructive sleep apnea, severe presently on CPAP and compliant with CPAP.   Patient was last seen in our office 08/06/2018 by Dr. Einar Gip for urgent visit with worsening dyspnea and fatigue as well as edema over the previous few weeks. Dr. Einar Gip felt patient to be in acute COPD exacerbation as well as acute decompensated diastolic heart failure. At last visit Dr. Einar Gip prescribed Atrovent inhaler, furosemide 40 mg twice daily. Chest x-ray ordered at last visit noted cardiomegaly with small bilateral pleural effusions and mild, diffuse bilateral interstitial pulmonary opacity, likely edema. He now presents for follow up.  Patient's volume status on exam has significantly improved, however he does continue to have leg edema.  Patient's weight has trended down approximately 8 pounds.  We will continue Lasix 40 mg p.o. twice daily.  Have requested that recent BMP be sent to our office from PCP.  In regard to questionable underlying COPD exacerbation, patient is seeing PCP tomorrow, will defer further management to PCP.  Further cardiac recommendations pending results of upcoming echocardiogram and stress test.  In regard to blood pressure, patient's pressure, it is soft in the office today.  However he is relatively asymptomatic.  Patient is currently taking doxazosin.  Would recommend discontinuing doxazosin unless necessary for BPH given soft blood pressure.  We will leave this to PCP at tomorrow's office visit.  Again reiterated the importance of low-sodium diet and fluid restriction, patient and his wife both verbalized understanding agreement.  Follow-up in 6 weeks, sooner if needed, for results of cardiac testing.   Alethia Berthold, PA-C 06/20/2021, 1:47 PM Office: [phone]

## 2021-06-20 ENCOUNTER — Ambulatory Visit: Payer: Medicare HMO | Admitting: Student

## 2021-06-20 ENCOUNTER — Encounter: Payer: Self-pay | Admitting: Student

## 2021-06-20 ENCOUNTER — Other Ambulatory Visit: Payer: Self-pay

## 2021-06-20 VITALS — BP 103/66 | HR 99 | Temp 98.1°F | Resp 16 | Ht 75.0 in | Wt 221.8 lb

## 2021-06-20 DIAGNOSIS — J9621 Acute and chronic respiratory failure with hypoxia: Secondary | ICD-10-CM

## 2021-06-20 DIAGNOSIS — I2781 Cor pulmonale (chronic): Secondary | ICD-10-CM

## 2021-06-20 DIAGNOSIS — I10 Essential (primary) hypertension: Secondary | ICD-10-CM | POA: Diagnosis not present

## 2021-06-21 DIAGNOSIS — G4730 Sleep apnea, unspecified: Secondary | ICD-10-CM | POA: Diagnosis not present

## 2021-06-21 DIAGNOSIS — E7800 Pure hypercholesterolemia, unspecified: Secondary | ICD-10-CM | POA: Diagnosis not present

## 2021-06-21 DIAGNOSIS — N1831 Chronic kidney disease, stage 3a: Secondary | ICD-10-CM | POA: Diagnosis not present

## 2021-06-21 DIAGNOSIS — D709 Neutropenia, unspecified: Secondary | ICD-10-CM | POA: Diagnosis not present

## 2021-06-21 DIAGNOSIS — Z0000 Encounter for general adult medical examination without abnormal findings: Secondary | ICD-10-CM | POA: Diagnosis not present

## 2021-06-21 DIAGNOSIS — E1122 Type 2 diabetes mellitus with diabetic chronic kidney disease: Secondary | ICD-10-CM | POA: Diagnosis not present

## 2021-06-21 DIAGNOSIS — R062 Wheezing: Secondary | ICD-10-CM | POA: Diagnosis not present

## 2021-06-21 DIAGNOSIS — I480 Paroxysmal atrial fibrillation: Secondary | ICD-10-CM | POA: Diagnosis not present

## 2021-06-21 DIAGNOSIS — I10 Essential (primary) hypertension: Secondary | ICD-10-CM | POA: Diagnosis not present

## 2021-06-21 DIAGNOSIS — E559 Vitamin D deficiency, unspecified: Secondary | ICD-10-CM | POA: Diagnosis not present

## 2021-06-23 NOTE — Progress Notes (Signed)
06/13/2021: HCT 46.3, Hgb 14.2, MCV 93.2, platelet 115 BUN 19, creatinine 1.4, potassium 4.0, sodium 146, GFR 56 A1c 7.0% Total cholesterol 115, HDL 50, LDL 53, triglycerides 62 TSH 1.96

## 2021-06-29 ENCOUNTER — Other Ambulatory Visit: Payer: Self-pay | Admitting: Cardiology

## 2021-06-29 ENCOUNTER — Ambulatory Visit: Payer: Medicare HMO | Admitting: Pulmonary Disease

## 2021-06-29 ENCOUNTER — Encounter: Payer: Self-pay | Admitting: Pulmonary Disease

## 2021-06-29 ENCOUNTER — Other Ambulatory Visit: Payer: Self-pay

## 2021-06-29 VITALS — BP 128/60 | HR 81 | Temp 97.8°F | Ht 75.0 in | Wt 221.0 lb

## 2021-06-29 DIAGNOSIS — I5031 Acute diastolic (congestive) heart failure: Secondary | ICD-10-CM

## 2021-06-29 DIAGNOSIS — J9611 Chronic respiratory failure with hypoxia: Secondary | ICD-10-CM

## 2021-06-29 NOTE — Patient Instructions (Addendum)
Nice to meet you  Your oxygen was low today in the office.  I recommend using oxygen at rest, 1 Liter.  We will send the order in for this and a portable oxygen concentrator, use 2 liters with exertion.  I ordered pulmonary function test and CT chest to look into why your oxygen is low.  Return to clinic in 4 weeks or sooner if needed with Dr. Silas Flood

## 2021-06-29 NOTE — Progress Notes (Signed)
_0  ID: Manuel [name], male    DOB: 1945/08/18, 75 y.o.   MRN: [id number]  Chief Complaint  Patient presents with   Consult    Pt is here for consult for chronic res failure. Pts wife states that he does have wheezing.     Referring provider: Deon Pilling, NP  HPI:   75 y.o. man whom we are seeing in consultation for evaluation of abnormal chest imaging, wheezing.  PCP note reviewed.  Most recent cardiology note x2 reviewed.  Patient feels well today.  He denies any significant dyspnea.  No shortness of breath.  Relatively asymptomatic.  History largely gathered via EMR.  He was seen by cardiology 11-15/22.  At that point time he was complaining of marked dyspnea on exertion over the last 4 to 6 weeks.  Also with orthopnea.  Increased lower extremity swelling.  Still smoking at that time.  The note from that cardiology visit states his oxygen saturation was low at that visit as well at the time of his PCP visit about a week prior.  Is placed on Lasix.  Current dose 40 mg twice a day.  Was seen 06/20/2021 by cardiology with appears to be improvement in exam.  Was to continue Lasix 40 mg twice daily.  He says he is doing that.  He had a chest x-ray 06/09/2021 reviewed interpreted as small to moderate bilateral loculated pleural effusions, increased interstitial markings may represent edema in the setting of bilateral effusions.  He reports about a 15-pack-year smoking history.  Quit a few weeks ago.  Diet he denies any recurrent or repeated pneumonias as a child or adult.  No frequent bouts of bronchitis as a child or a kid.  He does endorse frequent atopic symptoms, seasonal allergies.  Does not use any medicine just powers through.  Had hayfever a lot as a child.   On intake, O2 sat low 90s on room air but desaturated to 77% with talking.  Repeat O2 sat with forehead probe 84 to 85%.  He was placed on 1 L nasal cannula with oxygen saturation 94%.  He was ambulated around the clinic with  desaturation to the mid 80s.  2 L nasal cannula via POC maintain oxygen saturations around 94%.  New prescription for oxygen today.  Discussed at length that risks and benefits of oxygen and refusing oxygen.  He does not seem thrilled with the idea of using oxygen, he seemed to indicate he would likely not use it.  Again I counseled on the importance of using oxygen and the risk of not using oxygen at home.  PMH: tobacco abuse in remission, atrial fibrillation on anticoagulation, hypertension, diabetes Surgical history: Colon resection, colostomy revision/takedown, inguinal hernia repair, carotid surgery Family history: Mother and father with history of cancer, father with Alzheimer's disease Social history: Former smoker, quit 05/2021, from medicine, lives in [place] / Pulmonary Flowsheets:   ACT:  No flowsheet data found.  MMRC: No flowsheet data found.  Epworth:  No flowsheet data found.  Tests:   FENO:  No results found for: NITRICOXIDE  PFT: No flowsheet data found.  WALK:  No flowsheet data found.  Imaging: DG Chest 2 View  Result Date: 06/09/2021 CLINICAL DATA:  COPD EXAM: CHEST - 2 VIEW COMPARISON:  12/19/2017 FINDINGS: Cardiomegaly. Small bilateral pleural effusions. Mild, diffuse bilateral interstitial pulmonary opacity. The visualized skeletal structures are unremarkable. IMPRESSION: Cardiomegaly with small bilateral pleural effusions and mild, diffuse bilateral interstitial pulmonary  opacity, likely edema. Electronically Signed   By: Delanna Ahmadi M.D.   On: 06/09/2021 12:33    Lab Results:  CBC    Component Value Date/Time   WBC 3.4 (L) 05/15/2021 1059   WBC 5.1 06/02/2018 0419   RBC 4.84 05/15/2021 1059   HGB 13.8 05/15/2021 1059   HCT 46.7 05/15/2021 1059   PLT 147 (L) 05/15/2021 1059   MCV 96.5 05/15/2021 1059   MCH 28.5 05/15/2021 1059   MCHC 29.6 (L) 05/15/2021 1059   RDW 16.7 (H) 05/15/2021 1059   LYMPHSABS 0.6 (L)  05/15/2021 1059   MONOABS 0.3 05/15/2021 1059   EOSABS 0.0 05/15/2021 1059   BASOSABS 0.0 05/15/2021 1059    BMET    Component Value Date/Time   NA 141 05/17/2021 1306   K 4.2 05/17/2021 1306   CL 101 05/17/2021 1306   CO2 35 (H) 05/17/2021 1306   GLUCOSE 86 05/17/2021 1306   BUN 16 05/17/2021 1306   CREATININE 1.27 (H) 05/17/2021 1306   CREATININE 1.23 04/10/2011 1307   CALCIUM 9.0 05/17/2021 1306   GFRNONAA 59 (L) 05/17/2021 1306   GFRAA >60 06/01/2018 0407    BNP    Component Value Date/Time   BNP 359.2 (H) 06/06/2021 1625    ProBNP No results found for: PROBNP  Specialty Problems       Pulmonary Problems   OSA (obstructive sleep apnea)   Hypoxia    Allergies  Allergen Reactions   Sulfa Antibiotics Shortness Of Breath    Headaches     Immunization History  Administered Date(s) Administered   Influenza, High Dose Seasonal PF 06/13/2015, 06/19/2016   Influenza, Quadrivalent, Recombinant, Inj, Pf 04/26/2017, 03/25/2019, 05/27/2020, 03/31/2021   Moderna Sars-Covid-2 Vaccination 08/26/2019, 09/23/2019   Pneumococcal Conjugate-13 02/18/2017   Td 08/24/2009, 06/21/2016   Zoster Recombinat (Shingrix) 03/20/2021, 06/21/2021    Past Medical History:  Diagnosis Date   AAA (abdominal aortic aneurysm)    per cardiologist note dated 03-14-2018 last duplex , 30-41m  (dr gEinar Gip   Acute GI bleeding 12/19/2017   Allergic rhinitis, seasonal    Anemia secondary to renal failure    Anticoagulant long-term use    ELIQUIS   Arthritis    LOWER BACK   B12 deficiency    BPH (benign prostatic hyperplasia)    CKD (chronic [place] disease), stage III ([place]    Colonic stricture ([facility] 12/13/2017   Colostomy present ([facility]    Congenital absence of left kidney [facility] dr detarding   per CT 12-11-2017 and 01-02-2018   Duodenal ulcer 12/20/2017   Dyslipidemia    Esophageal candidiasis ([facility] 12/19/2017   Gastric ulcer without  hemorrhage or perforation 12/20/2017   History of bowel resection 12/13/2017   for sigmoid stricture-- emergency sigmoidectomy   Hypercholesteremia 09/19/2018   Hyperparathyroidism, secondary renal ([facility]    Hypertension    Left carotid bruit    OSA on CPAP    followed by [facility]   Permanent atrial fibrillation ([facility] 2017   cardiologist-  dr gEinar Gip  Solitary right kidney    Type 2 diabetes mellitus ([facility]    followed by pcp   Wears glasses    Wears partial dentures    UPPER AND LOWER    Tobacco History: Social History   Tobacco Use  Smoking Status Former   Packs/day: 0.50   Years: 30.00   Pack years: 15.00   Types: Cigarettes  Smokeless Tobacco Never  Tobacco Comments  Patient stated the he quit smoking again right after last OV.   Counseling given: Not Answered Tobacco comments: Patient stated the he quit smoking again right after last OV.   Continue to not smoke  Outpatient Encounter Medications as of 06/29/2021  Medication Sig   acetaminophen (TYLENOL) 500 MG tablet Take 500 mg by mouth every 6 (six) hours as needed for moderate pain.   albuterol (VENTOLIN HFA) 108 (90 Base) MCG/ACT inhaler Inhale 2 puffs into the lungs every 6 (six) hours as needed for wheezing or shortness of breath.   diltiazem (CARDIZEM CD) 120 MG 24 hr capsule Take 120 mg by mouth 2 (two) times daily.    doxazosin (CARDURA) 8 MG tablet Take 8 mg by mouth at bedtime.    ELIQUIS 5 MG TABS tablet TAKE 1 TABLET BY MOUTH TWICE A DAY   FARXIGA 10 MG TABS tablet Take 10 mg by mouth daily.   ferrous sulfate 325 (65 FE) MG tablet Take 325 mg by mouth 2 (two) times daily with a meal.   furosemide (LASIX) 40 MG tablet TAKE 1 TABLET BY MOUTH EVERY DAY IN THE MORNING   lisinopril (PRINIVIL,ZESTRIL) 20 MG tablet Take 20 mg by mouth at bedtime.    metFORMIN (GLUCOPHAGE) 500 MG tablet Take 500 mg by mouth 2 (two) times daily with a meal.    potassium chloride (KLOR-CON) 10 MEQ tablet Take 2 tablets  (20 mEq total) by mouth daily as needed (Take with furosemide only).   pravastatin (PRAVACHOL) 20 MG tablet Take 20 mg by mouth every evening.    vitamin B-12 (CYANOCOBALAMIN) 250 MCG tablet Take 1,000 mcg by mouth 2 (two) times daily.   D3-50 1.25 MG (50000 UT) capsule Take 50,000 Units by mouth once a week.   No facility-administered encounter medications on file as of 06/29/2021.     Review of Systems  Review of Systems  No chest pain with exertion.  No orthopnea or PND.  Comprehensive review of systems otherwise negative. Physical Exam  BP 128/60 (BP Location: Left Arm, Patient Position: Sitting, Cuff Size: Normal)   Pulse 81   Temp 97.8 F (36.6 C) (Oral)   Ht _0  (1.905 m)   Wt 221 lb (100.2 kg)   SpO2 92%   BMI 27.62 kg/m   Wt Readings from Last 5 Encounters:  06/29/21 221 lb (100.2 kg)  06/20/21 221 lb 12.8 oz (100.6 kg)  06/06/21 229 lb (103.9 kg)  05/24/21 236 lb 6.4 oz (107.2 kg)  05/17/21 233 lb 9.6 oz (106 kg)    BMI Readings from Last 5 Encounters:  06/29/21 27.62 kg/m  06/20/21 27.72 kg/m  06/06/21 28.62 kg/m  05/24/21 29.55 kg/m  05/17/21 29.20 kg/m     Physical Exam General: Well-appearing, no acute distress Eyes: EOMI, icterus Neck: Supple, no JVP Pulmonary: Inspiratory squeak and expiratory wheeze right lower lobe, otherwise clear Cardiovascular: Irregularly irregular rhythm, regular rate, no murmurs Abdomen: Nondistended, bowel sounds present MSK: No synovitis, no joint effusion Neuro: Normal gait, no weakness Psych: Normal mood, full affect.  Assessment & Plan:   Acute, possibly chronic, hypoxemic respiratory failure: Sats mid 80s to low 90s on room air at rest.  Desaturate with talking to the 70s.  Cardiology note 11/15 indicates desaturation to the 70s at PCP office recently.  Now on oxygen.  Unclear why oxygen has not been prescribed given this.  Notably, he had a O2 saturation of 99% 04/2021 at office visit.  No polycythemia.  Feel  this is rather acute or recent.  Possibly related to overload as documented by cardiology note.  On Lasix.  Echocardiogram not performed yet.  Chest x-ray reveals bilateral pleural effusions, interstitial markings consistent with edema in my opinion.  Likely hypoxemia due to heart failure symptoms.  To continue Lasix 40 mg twice a day.  We will further evaluate with PFTs, CT high-res to evaluate for possible ILD as this could be what the issue is on chest x-ray as opposed to interstitial edema.  Oxygen ordered today, 1 L at rest, 2 L with exertion via POC.  Curiously, he denies any dyspnea or shortness of breath.   Return in about 4 weeks (around 07/27/2021).   Lanier Clam, MD 06/29/2021   I spent 85 minutes in the care of this patient including review of records, face-to-face visit (majority of time), and coordination of care.

## 2021-06-30 DIAGNOSIS — J9601 Acute respiratory failure with hypoxia: Secondary | ICD-10-CM | POA: Diagnosis not present

## 2021-07-02 DIAGNOSIS — G4733 Obstructive sleep apnea (adult) (pediatric): Secondary | ICD-10-CM | POA: Diagnosis not present

## 2021-07-03 DIAGNOSIS — G4733 Obstructive sleep apnea (adult) (pediatric): Secondary | ICD-10-CM | POA: Diagnosis not present

## 2021-07-05 ENCOUNTER — Other Ambulatory Visit: Payer: Self-pay

## 2021-07-05 ENCOUNTER — Ambulatory Visit: Payer: Medicare HMO

## 2021-07-05 ENCOUNTER — Ambulatory Visit (HOSPITAL_BASED_OUTPATIENT_CLINIC_OR_DEPARTMENT_OTHER): Payer: Medicare HMO | Attending: Pulmonary Disease

## 2021-07-05 DIAGNOSIS — J9621 Acute and chronic respiratory failure with hypoxia: Secondary | ICD-10-CM

## 2021-07-05 DIAGNOSIS — I5031 Acute diastolic (congestive) heart failure: Secondary | ICD-10-CM | POA: Diagnosis not present

## 2021-07-11 NOTE — Progress Notes (Signed)
Findings are consistent with chronic cor pulmonale with regard to RV findings.  Patient's continued smoking and COPD contributing.  Discussed an office visit soon.  Normal LVEF.

## 2021-07-24 ENCOUNTER — Other Ambulatory Visit: Payer: Self-pay | Admitting: Cardiology

## 2021-07-24 DIAGNOSIS — I5031 Acute diastolic (congestive) heart failure: Secondary | ICD-10-CM

## 2021-07-27 ENCOUNTER — Other Ambulatory Visit: Payer: Self-pay

## 2021-07-27 ENCOUNTER — Ambulatory Visit (HOSPITAL_BASED_OUTPATIENT_CLINIC_OR_DEPARTMENT_OTHER)
Admission: RE | Admit: 2021-07-27 | Discharge: 2021-07-27 | Disposition: A | Discharge status: Home / Self Care | Payer: Medicare HMO | Source: Ambulatory Visit | Attending: Pulmonary Disease | Admitting: Pulmonary Disease

## 2021-07-27 DIAGNOSIS — J432 Centrilobular emphysema: Secondary | ICD-10-CM | POA: Diagnosis not present

## 2021-07-27 DIAGNOSIS — J9611 Chronic respiratory failure with hypoxia: Secondary | ICD-10-CM | POA: Insufficient documentation

## 2021-07-27 DIAGNOSIS — R911 Solitary pulmonary nodule: Secondary | ICD-10-CM | POA: Diagnosis not present

## 2021-07-31 DIAGNOSIS — J9601 Acute respiratory failure with hypoxia: Secondary | ICD-10-CM | POA: Diagnosis not present

## 2021-08-01 ENCOUNTER — Ambulatory Visit: Payer: Medicare HMO | Admitting: Student

## 2021-08-01 NOTE — Progress Notes (Deleted)
Primary Physician/Referring:  [name], DO  Patient ID: Manuel [name], male    DOB: 04-16-1946, 76 y.o.   MRN: [id number]  No chief complaint on file.  HPI:    Manuel [name]  is a 76 y.o. AAM patient with permanent atrial fibrillation, hypertension, diabetes mellitus with peripheral arterial disease, ongoing tobacco use disorder with underlying COPD, obstructive sleep apnea, severe presently on CPAP and compliant with CPAP, He had colonic stricture and pneumatosis leading to obstruction on 12/13/2017 and had colostomy which was reversed.   Patient presents for 6-week follow-up for results of cardiac testing.  Last office visit given worsening dyspnea as well as edema ordered echocardiogram and stress test.  Stress test was overall low risk.  Echocardiogram revealed LVEF 60-65% with RV findings consistent with chronic cor pulmonale likely secondary to patient's continued smoking and underlying COPD.  Also at last office visit patient's blood pressure was soft, therefore advised him to follow-up with PCP and consider discontinuing doxazosin.***  ***  Patient was last seen in our office 08/06/2018 by Dr. Einar Gip for urgent visit with worsening dyspnea and fatigue as well as edema over the previous few weeks. Dr. Einar Gip felt patient to be in acute COPD exacerbation as well as acute decompensated diastolic heart failure. At last visit Dr. Einar Gip prescribed Atrovent inhaler, furosemide 40 mg twice daily. Chest x-ray ordered at last visit noted cardiomegaly with small bilateral pleural effusions and mild, diffuse bilateral interstitial pulmonary opacity, likely edema. He now presents for follow up.  Patient's bilateral leg edema and dyspnea have significantly improved since last office visit.  His weight is down approximately 8 pounds, patient has diuresed well.  He is tolerating furosemide without issue, however he has not been using inhaler regularly.  Notably patient's BNP is elevated at 359, echo  and stress test pending.  He is scheduled to see PCP tomorrow.  He had recent lab work done since initiation of furosemide by PCPs office, have requested that patient have these results sent to me for review.  Fortunately patient continues to smoke.  Past Medical History:  Diagnosis Date   AAA (abdominal aortic aneurysm)    per cardiologist note dated 03-14-2018 last duplex , 30-78m  (dr gEinar Gip   Acute GI bleeding 12/19/2017   Allergic rhinitis, seasonal    Anemia secondary to renal failure    Anticoagulant long-term use    ELIQUIS   Arthritis    LOWER BACK   B12 deficiency    BPH (benign prostatic hyperplasia)    CKD (chronic kidney disease), stage III ([facility]    Col[place] strict[place] ([facility] 12/13/2017   Col[place] present ([facility]    Congenital absence of left kidney [facility] dr detarding   per CT 12-11-2017 and 01-02-2018   Duodenal ulcer 12/20/2017   Dyslipidemia    Esophageal candidiasis ([facility] 12/19/2017   Gastric ulcer without hemorrhage or perforation 12/20/2017   History of bowel resection 12/13/2017   for sigmoid stricture-- emergency sigmoidectomy   Hypercholesteremia 09/19/2018   Hyperparathyroidism, secondary renal ([facility]    Hypertension    Left carotid bruit    OSA on CPAP    followed by [facility]   Permanent atrial fibrillation ([facility] 2017   cardiologist-  dr gEinar Gip  Solitary right kidney    Type 2 diabetes mellitus ([facility]    followed by pcp   Wears glasses    Wears partial dentures    UPPER AND LOWER   Past  Surgical History:  Procedure Laterality Date   BIOPSY  12/19/2017   Procedure: BIOPSY;  Surgeon: Carol Ada, MD;  Location: [facility];  Service: Endoscopy;;   CARDIOVASCULAR STRESS TEST  11/16/2011   low risk nuclear study w/ no ischemia/  normal LV function and wall motion , ef 60%   CARDIOVERSION  12/18/2011   Procedure: CARDIOVERSION;  Surgeon: Laverda Page, MD;  Location: [place];  Service:  Cardiovascular;  Laterality: N/A;   CATARACT EXTRACTION W/ INTRAOCULAR LENS IMPLANT Right 2015   COLON RESECTION N/A 12/13/2017   Procedure: LAPAROSCOPIC END COLOSTOMY HARTMAN'S PROCEDURE;  Surgeon: Leighton Ruff, MD;  Location: [facility];  Service: General;  Laterality: N/A;   COLOSTOMY TAKEDOWN N/A 05/29/2018   Procedure: LAPAROSCOPIC COLOSTOMY REVERSAL ERAS PATHWAY, RIGID PROCTOSCOPY;  Surgeon: Leighton Ruff, MD;  Location: [facility];  Service: General;  Laterality: N/A;   CYST REMOVED FROM UPPER BACK  09/2017   BENIGN   ESOPHAGOGASTRODUODENOSCOPY N/A 12/19/2017   Procedure: ESOPHAGOGASTRODUODENOSCOPY (EGD);  Surgeon: Carol Ada, MD;  Location: [facility];  Service: Endoscopy;  Laterality: N/A;   FLEXIBLE SIGMOIDOSCOPY N/A 12/13/2017   Procedure: FLEXIBLE SIGMOIDOSCOPY;  Surgeon: Carol Ada, MD;  Location: [facility];  Service: Endoscopy;  Laterality: N/A;   INGUINAL HERNIA REPAIR Left 1980s   AND REMOVAL CYST ON UPPER BACK   PAROTIDECTOMY Right 09-21-1999   dr Janace Hoard _0    salivery gland mass  (benign warthin's tumor)   TRANSTHORACIC ECHOCARDIOGRAM  01/04/2018   mild LVH, ef 40-98%, grade 2 diastolic function/  trivial AR and MR/  mild LAE/  mild to moderate TR   Family History  Problem Relation Age of Onset   Cancer Mother    Cancer Father    Alzheimer's disease Father    Kidney disease Paternal Aunt    Sleep apnea Neg Hx     Social History   Tobacco Use   Smoking status: Former    Packs/day: 0.50    Years: 30.00    Pack years: 15.00    Types: Cigarettes   Smokeless tobacco: Never   Tobacco comments:    Patient stated the he quit smoking again right after last OV.  Substance Use Topics   Alcohol use: No    Alcohol/week: 0.0 standard drinks   ROS  Review of Systems  Constitutional: Negative for malaise/fatigue and weight gain.  Cardiovascular:  Positive for dyspnea on exertion (improved), leg swelling (improved) and paroxysmal nocturnal dyspnea. Negative for chest  pain, claudication, near-syncope, orthopnea, palpitations and syncope.  Respiratory:  Positive for snoring (OSA on CPAP). Negative for shortness of breath.   Gastrointestinal:  Negative for melena.  Neurological:  Negative for dizziness.  Objective  There were no vitals taken for this visit.       Vitals with BMI 06/29/2021 06/20/2021 06/06/2021  Height _1  _2  _3   Weight 221 lbs 221 lbs 13 oz 229 lbs  BMI 27.62 11.91 47.82  Systolic 956 213 086  Diastolic 60 66 87  Pulse 81 99 82     Physical Exam Vitals reviewed.  Neck:     Vascular: JVD present. No carotid bruit.  Cardiovascular:     Rate and Rhythm: Rhythm irregularly irregular.     Pulses:          Dorsalis pedis pulses are 0 on the right side and 0 on the left side.       Posterior tibial pulses are 0 on the right side and 0 on the left  side.     Heart sounds: Murmur heard.  High-pitched blowing midsystolic murmur is present with a grade of 3/6 at the lower right sternal border and apex.    No gallop.  Pulmonary:     Effort: Pulmonary effort is normal.     Breath sounds: Wheezing (bilaterally) present.  Abdominal:     General: Bowel sounds are normal.     Palpations: Abdomen is soft.  Musculoskeletal:     Right lower leg: Edema (1+ pitting to knee) present.     Left lower leg: Edema (1+ pitting to knee) present.   Laboratory examination:    CMP Latest Ref Rng & Units 05/17/2021 06/01/2018 05/31/2018  Glucose 70 - 99 mg/dL 86 108(H) 82  BUN 8 - 23 mg/dL _0 Creatinine 0.61 - 1.24 mg/dL 1.27(H) 0.99 1.05  Sodium 135 - 145 mmol/L 141 139 140  Potassium 3.5 - 5.1 mmol/L 4.2 4.1 4.2  Chloride 98 - 111 mmol/L 101 103 105  CO2 22 - 32 mmol/L 35(H) 30 31  Calcium 8.9 - 10.3 mg/dL 9.0 8.3(L) 8.4(L)  Total Protein 6.5 - 8.1 g/dL 6.8 - -  Total Bilirubin 0.3 - 1.2 mg/dL 1.2 - -  Alkaline Phos 38 - 126 U/L 82 - -  AST 15 - 41 U/L 14(L) - -  ALT 0 - 44 U/L 12 - -   CBC Latest Ref Rng & Units 05/15/2021  06/02/2018 06/01/2018  WBC 4.0 - 10.5 K/uL 3.4(L) 5.1 5.5  Hemoglobin 13.0 - 17.0 g/dL 13.8 11.1(L) 11.1(L)  Hematocrit 39.0 - 52.0 % 46.7 38.8(L) 38.2(L)  Platelets 150 - 400 K/uL 147(L) 222 193   Lipid Panel  No results found for: CHOL, TRIG, HDL, CHOLHDL, VLDL, LDLCALC, LDLDIRECT HEMOGLOBIN A1C Lab Results  Component Value Date   HGBA1C 6.5 (H) 05/21/2018   MPG 139.85 05/21/2018   TSH No results for input(s): TSH in the last 8760 hours.  External labs:  05/02/2021: TSH normal at 1.53.  Vitamin D markedly reduced at 7.4.  B12 510.  A1c 6.7%. Serum glucose 100 mg, BUN 13, creatinine 1.37, EGFR 58 mL, potassium 4.1. Hb 13.4/HCT 44.0, platelets 162. Urine analysis normal. Total cholesterol 123, triglycerides 61, HDL 46, LDL 65.  05/20/2020:  Hb 15.3/HCT 45.5, platelets 149. Sodium 147, potassium 4.8, serum glucose 113, BUN 17, creatinine 1.4, EGFR 63 mL, CMP otherwise normal. Total cholesterol 115, triglycerides 54, HDL 48, LDL 56. TSH normal.  07/08/2019:  Hb 15.3/HCT 48.6, platelets 174. Serum glucose 89 mg, BUN 14, creatinine 1.2, EGFR >60 mL, CMP normal. A1c 6.5%. Total cholesterol 115, triglycerides 48, HDL 47, LDL 58. Uric acid 6.5.   Allergies   Allergies  Allergen Reactions   Sulfa Antibiotics Shortness Of Breath    Headaches       Medications Prior to Visit:   Outpatient Medications Prior to Visit  Medication Sig Dispense Refill   acetaminophen (TYLENOL) 500 MG tablet Take 500 mg by mouth every 6 (six) hours as needed for moderate pain.     albuterol (VENTOLIN HFA) 108 (90 Base) MCG/ACT inhaler Inhale 2 puffs into the lungs every 6 (six) hours as needed for wheezing or shortness of breath. 1 each 2   D3-50 1.25 MG (50000 UT) capsule Take 50,000 Units by mouth once a week.     diltiazem (CARDIZEM CD) 120 MG 24 hr capsule Take 120 mg by mouth 2 (two) times daily.   5   doxazosin (CARDURA) 8 MG tablet Take  8 mg by mouth at bedtime.      ELIQUIS 5 MG TABS  tablet TAKE 1 TABLET BY MOUTH TWICE A DAY 180 tablet 3   FARXIGA 10 MG TABS tablet Take 10 mg by mouth daily.     ferrous sulfate 325 (65 FE) MG tablet Take 325 mg by mouth 2 (two) times daily with a meal.     furosemide (LASIX) 40 MG tablet TAKE 1 TABLET BY MOUTH EVERY DAY IN THE MORNING 30 tablet 0   lisinopril (PRINIVIL,ZESTRIL) 20 MG tablet Take 20 mg by mouth at bedtime.      metFORMIN (GLUCOPHAGE) 500 MG tablet Take 500 mg by mouth 2 (two) times daily with a meal.   5   potassium chloride (KLOR-CON) 10 MEQ tablet Take 2 tablets (20 mEq total) by mouth daily as needed (Take with furosemide only). 60 tablet 2   pravastatin (PRAVACHOL) 20 MG tablet Take 20 mg by mouth every evening.      vitamin B-12 (CYANOCOBALAMIN) 250 MCG tablet Take 1,000 mcg by mouth 2 (two) times daily.     No facility-administered medications prior to visit.   Final Medications at End of Visit    No outpatient medications have been marked as taking for the 08/01/21 encounter (Appointment) with Rayetta Pigg, Quanell Loughney C, PA-C.   Radiology:   No AAA by CTA abdomen in 2019.  CXR 12/02/2017: Chronic bibasilar densities, likely scarring.  Mild cardiomegaly.  Cardiac Studies:   Exercise sestamibi 11/16/11: Gut uptake artifact. No ischemia. EF 60%.Low risk. 7.3 METs. Symptoms included Dyspnea.   Echocardiogram 01/04/2018: - Left ventricle: The cavity size was normal. Wall thickness was    increased in a pattern of mild LVH. Systolic function was normal.    The estimated ejection fraction was in the range of 50% to 55%.    Wall motion was normal; there were no regional wall motion    abnormalities. Features are consistent with a pseudonormal left    ventricular filling pattern, with concomitant abnormal relaxation    and increased filling pressure (grade 2 diastolic dysfunction).  -Aortic valve: Trace aortic regurgitation. -Left atrium is mildly dilated.  EKG  06/06/2021: Atrial fibrillation with controlled  ventricular response at rate of 76 bpm, rightward axis, anterolateral infarct old.  Single PVC.  Low-voltage complexes.  Pulmonary disease pattern.  Compared to 09/21/2020, previously leftward axis. Assessment   No diagnosis found.  CHA2DS2-VASc Score is 4.  Yearly risk of stroke: 4.8% (A, HTN, DM).  Score of 1=0.6; 2=2.2; 3=3.2; 4=4.8; 5=7.2; 6=9.8; 7=>9.8) -(CHF; HTN; vasc disease DM,  Male = 1; Age <65 =0; 65-74 = 1,  >75 =2; stroke/embolism= 2).   No orders of the defined types were placed in this encounter.   There are no discontinued medications.  Recommendations:   ARIV PENROD  is a 76 y.o. AAM patient with permanent atrial fibrillation, hypertension, diabetes mellitus with peripheral arterial disease, ongoing tobacco use disorder with underlying COPD, obstructive sleep apnea, severe presently on CPAP and compliant with CPAP.   Patient was last seen in our office 08/06/2018 by Dr. Einar Gip for urgent visit with worsening dyspnea and fatigue as well as edema over the previous few weeks. Dr. Einar Gip felt patient to be in acute COPD exacerbation as well as acute decompensated diastolic heart failure. At last visit Dr. Einar Gip prescribed Atrovent inhaler, furosemide 40 mg twice daily. Chest x-ray ordered at last visit noted cardiomegaly with small bilateral pleural effusions and mild, diffuse bilateral interstitial pulmonary opacity,  likely edema. He now presents for follow up.  Patient's volume status on exam has significantly improved, however he does continue to have leg edema.  Patient's weight has trended down approximately 8 pounds.  We will continue Lasix 40 mg p.o. twice daily.  Have requested that recent BMP be sent to our office from PCP.  In regard to questionable underlying COPD exacerbation, patient is seeing PCP tomorrow, will defer further management to PCP.  Further cardiac recommendations pending results of upcoming echocardiogram and stress test.  In regard to blood pressure,  patient's pressure, it is soft in the office today.  However he is relatively asymptomatic.  Patient is currently taking doxazosin.  Would recommend discontinuing doxazosin unless necessary for BPH given soft blood pressure.  We will leave this to PCP at tomorrow's office visit.  Again reiterated the importance of low-sodium diet and fluid restriction, patient and his wife both verbalized understanding agreement.  Follow-up in 6 weeks, sooner if needed, for results of cardiac testing.   Alethia Berthold, PA-C 08/01/2021, 9:34 AM Office: [phone]

## 2021-08-02 DIAGNOSIS — G4733 Obstructive sleep apnea (adult) (pediatric): Secondary | ICD-10-CM | POA: Diagnosis not present

## 2021-08-07 ENCOUNTER — Other Ambulatory Visit: Payer: Self-pay

## 2021-08-07 ENCOUNTER — Encounter: Payer: Self-pay | Admitting: Student

## 2021-08-07 ENCOUNTER — Ambulatory Visit: Payer: Medicare HMO | Admitting: Student

## 2021-08-07 VITALS — BP 94/63 | HR 75 | Temp 97.8°F | Resp 16 | Ht 75.0 in | Wt 204.0 lb

## 2021-08-07 DIAGNOSIS — I2781 Cor pulmonale (chronic): Secondary | ICD-10-CM

## 2021-08-07 DIAGNOSIS — I10 Essential (primary) hypertension: Secondary | ICD-10-CM | POA: Diagnosis not present

## 2021-08-07 DIAGNOSIS — J9621 Acute and chronic respiratory failure with hypoxia: Secondary | ICD-10-CM | POA: Diagnosis not present

## 2021-08-07 MED ORDER — DOXAZOSIN MESYLATE 4 MG PO TABS: 4.0000 mg | ORAL_TABLET | Freq: Every day | ORAL | 3 refills | Status: DC

## 2021-08-07 MED ORDER — LISINOPRIL 10 MG PO TABS: 10.0000 mg | ORAL_TABLET | Freq: Every day | ORAL | 3 refills | Status: DC

## 2021-08-07 NOTE — Progress Notes (Signed)
Primary Physician/Referring:  Manuel Morning, DO  Patient ID: Manuel [name], male    DOB: 1946/01/11, 76 y.o.   MRN: [id number]  Chief Complaint  Patient presents with   Congestive Heart Failure   Follow-up    6 week   HPI:    Manuel [name]  is a 76 y.o. AAM patient with permanent atrial fibrillation, hypertension, diabetes mellitus with peripheral arterial disease, ongoing tobacco use disorder with underlying COPD, obstructive sleep apnea, severe presently on CPAP and compliant with CPAP, He had colonic stricture and pneumatosis leading to obstruction on 12/13/2017 and had colostomy which was reversed.   Patient presents for 6-week follow-up for results of cardiac testing.  Last office visit given worsening dyspnea as well as edema ordered echocardiogram and stress test.  Stress test was overall low risk.  Echocardiogram revealed LVEF 60-65% with RV findings consistent with chronic cor pulmonale likely secondary to patient's continued smoking and underlying COPD.  Also at last office visit patient's blood pressure was soft, therefore advised him to follow-up with PCP and consider discontinuing doxazosin however that was not done.   Patient reports dyspnea has significantly improved with diuresis.  However he does state over the last 1 to 2 weeks he has intermittent episodes of lightheadedness.  Bilateral leg edema has essentially resolved.   She does report that he quit smoking in November 2022.  Past Medical History:  Diagnosis Date   AAA (abdominal aortic aneurysm)    per cardiologist note dated 03-14-2018 last duplex , 30-14m  (dr gEinar Gip   Acute GI bleeding 12/19/2017   Allergic rhinitis, seasonal    Anemia secondary to renal failure    Anticoagulant long-term use    ELIQUIS   Arthritis    LOWER BACK   B12 deficiency    BPH (benign prostatic hyperplasia)    CKD (chronic kidney disease), stage III ([facility]    Colonic stricture ([facility] 12/13/2017   Colostomy present ([facility]     Congenital absence of left kidney [facility] dr detarding   per CT 12-11-2017 and 01-02-2018   Duodenal ulcer 12/20/2017   Dyslipidemia    Esophageal candidiasis ([facility] 12/19/2017   Gastric ulcer without hemorrhage or perforation 12/20/2017   History of bowel resection 12/13/2017   for sigmoid stricture-- emergency sigmoidectomy   Hypercholesteremia 09/19/2018   Hyperparathyroidism, secondary renal ([place]    Hypertension    Left carotid bruit    OSA on CPAP    followed by [facility]   Permanent atrial fibrillation ([facility] 2017   cardiologist-  dr gEinar Gip  Solitary right kidney    Type 2 diabetes mellitus ([place]    followed by pcp   Wears glasses    Wears partial dentures    UPPER AND LOWER   Past Surgical History:  Procedure Laterality Date   BIOPSY  12/19/2017   Procedure: BIOPSY;  Surgeon: HCarol Ada MD;  [facility];  Service: Endoscopy;;   CARDIOVASCULAR STRESS TEST  11/16/2011   low risk nuclear study w/ no ischemia/  normal LV function and wall motion , ef 60%   CARDIOVERSION  12/18/2011   Procedure: CARDIOVERSION;  Surgeon: JLaverda Page MD;  [facility]: [facility]  Service: Cardiovascular;  Laterality: N/A;   CATARACT EXTRACTION W/ INTRAOCULAR LENS IMPLANT Right 2015   COLON RESECTION N/A 12/13/2017   Procedure: LAPAROSCOPIC END COLOSTOMY HARTMAN'S PROCEDURE;  Surgeon: TLeighton Ruff MD;  Location: [facility];  Service: General;  Laterality: N/A;  COLOSTOMY TAKEDOWN N/A 05/29/2018   Procedure: LAPAROSCOPIC COLOSTOMY REVERSAL ERAS PATHWAY, RIGID PROCTOSCOPY;  Surgeon: Leighton Ruff, MD;  Location: [facility];  Service: General;  Laterality: N/A;   CYST REMOVED FROM UPPER BACK  09/2017   BENIGN   ESOPHAGOGASTRODUODENOSCOPY N/A 12/19/2017   Procedure: ESOPHAGOGASTRODUODENOSCOPY (EGD);  Surgeon: Carol Ada, MD;  Location: [facility];  Service: Endoscopy;  Laterality: N/A;   FLEXIBLE SIGMOIDOSCOPY N/A 12/13/2017   Procedure:  FLEXIBLE SIGMOIDOSCOPY;  Surgeon: Carol Ada, MD;  Location: [facility];  Service: Endoscopy;  Laterality: N/A;   INGUINAL HERNIA REPAIR Left 1980s   AND REMOVAL CYST ON UPPER BACK   PAROTIDECTOMY Right 09-21-1999   dr Janace Hoard _0    salivery gland mass  (benign warthin's tumor)   TRANSTHORACIC ECHOCARDIOGRAM  01/04/2018   mild LVH, ef 16-10%, grade 2 diastolic function/  trivial AR and MR/  mild LAE/  mild to moderate TR   Family History  Problem Relation Age of Onset   Cancer Mother    Cancer Father    Alzheimer's disease Father    Kidney disease Paternal Aunt    Sleep apnea Neg Hx     Social History   Tobacco Use   Smoking status: Former    Packs/day: 0.50    Years: 30.00    Pack years: 15.00    Types: Cigarettes   Smokeless tobacco: Never   Tobacco comments:    Patient stated the he quit smoking again right after last OV.  Substance Use Topics   Alcohol use: No    Alcohol/week: 0.0 standard drinks   ROS  Review of Systems  Constitutional: Negative for malaise/fatigue and weight gain.  Cardiovascular:  Negative for chest pain, claudication, dyspnea on exertion, leg swelling, near-syncope, orthopnea, palpitations, paroxysmal nocturnal dyspnea (improved) and syncope.  Respiratory:  Positive for snoring (OSA on CPAP). Negative for shortness of breath.   Gastrointestinal:  Negative for melena.  Neurological:  Negative for dizziness.  Objective  Blood pressure 94/63, pulse 75, temperature 97.8 F (36.6 C), resp. rate 16, height _1  (1.905 m), weight 204 lb (92.5 kg), SpO2 97 %.   SpO2: 97 %   Vitals with BMI 08/07/2021 06/29/2021 06/20/2021  Height _2  _3  _4   Weight 204 lbs 221 lbs 221 lbs 13 oz  BMI 25.5 96.04 54.09  Systolic 94 811 914  Diastolic 63 60 66  Pulse 75 81 99     Physical Exam Vitals reviewed.  Neck:     Vascular: JVD present. No carotid bruit.  Cardiovascular:     Rate and Rhythm: Rhythm irregularly irregular.     Pulses:           Dorsalis pedis pulses are 0 on the right side and 0 on the left side.       Posterior tibial pulses are 0 on the right side and 0 on the left side.     Heart sounds: Murmur heard.  High-pitched blowing midsystolic murmur is present with a grade of 3/6 at the lower right sternal border and apex.    No gallop.  Pulmonary:     Effort: Pulmonary effort is normal.     Breath sounds: No wheezing.  Musculoskeletal:     Right lower leg: Edema (trace) present.     Left lower leg: Edema (trace) present.   Laboratory examination:    CMP Latest Ref Rng & Units 05/17/2021 06/01/2018 05/31/2018  Glucose 70 - 99 mg/dL 86 108(H) 82  BUN 8 -  23 mg/dL _0 Creatinine 0.61 - 1.24 mg/dL 1.27(H) 0.99 1.05  Sodium 135 - 145 mmol/L 141 139 140  Potassium 3.5 - 5.1 mmol/L 4.2 4.1 4.2  Chloride 98 - 111 mmol/L 101 103 105  CO2 22 - 32 mmol/L 35(H) 30 31  Calcium 8.9 - 10.3 mg/dL 9.0 8.3(L) 8.4(L)  Total Protein 6.5 - 8.1 g/dL 6.8 - -  Total Bilirubin 0.3 - 1.2 mg/dL 1.2 - -  Alkaline Phos 38 - 126 U/L 82 - -  AST 15 - 41 U/L 14(L) - -  ALT 0 - 44 U/L 12 - -   CBC Latest Ref Rng & Units 05/15/2021 06/02/2018 06/01/2018  WBC 4.0 - 10.5 K/uL 3.4(L) 5.1 5.5  Hemoglobin 13.0 - 17.0 g/dL 13.8 11.1(L) 11.1(L)  Hematocrit 39.0 - 52.0 % 46.7 38.8(L) 38.2(L)  Platelets 150 - 400 K/uL 147(L) 222 193   Lipid Panel  No results found for: CHOL, TRIG, HDL, CHOLHDL, VLDL, LDLCALC, LDLDIRECT HEMOGLOBIN A1C Lab Results  Component Value Date   HGBA1C 6.5 (H) 05/21/2018   MPG 139.85 05/21/2018   TSH No results for input(s): TSH in the last 8760 hours.  External labs:  06/13/2021: HCT 46.3, Hgb 14.2, MCV 93.2, platelet 115 BUN 19, creatinine 1.4, potassium 4.0, sodium 146, GFR 56 A1c 7.0% Total cholesterol 115, HDL 50, LDL 53, triglycerides 62 TSH 1.96  05/02/2021: TSH normal at 1.53.  Vitamin D markedly reduced at 7.4.  B12 510.  A1c 6.7%. Serum glucose 100 mg, BUN 13, creatinine 1.37, EGFR 58 mL,  potassium 4.1. Hb 13.4/HCT 44.0, platelets 162. Urine analysis normal. Total cholesterol 123, triglycerides 61, HDL 46, LDL 65.  05/20/2020:  Hb 15.3/HCT 45.5, platelets 149. Sodium 147, potassium 4.8, serum glucose 113, BUN 17, creatinine 1.4, EGFR 63 mL, CMP otherwise normal. Total cholesterol 115, triglycerides 54, HDL 48, LDL 56. TSH normal.  07/08/2019:  Hb 15.3/HCT 48.6, platelets 174. Serum glucose 89 mg, BUN 14, creatinine 1.2, EGFR >60 mL, CMP normal. A1c 6.5%. Total cholesterol 115, triglycerides 48, HDL 47, LDL 58. Uric acid 6.5.   Allergies   Allergies  Allergen Reactions   Sulfa Antibiotics Shortness Of Breath    Headaches     Medications Prior to Visit:   Outpatient Medications Prior to Visit  Medication Sig Dispense Refill   acetaminophen (TYLENOL) 500 MG tablet Take 500 mg by mouth every 6 (six) hours as needed for moderate pain.     albuterol (VENTOLIN HFA) 108 (90 Base) MCG/ACT inhaler Inhale 2 puffs into the lungs every 6 (six) hours as needed for wheezing or shortness of breath. 1 each 2   D3-50 1.25 MG (50000 UT) capsule Take 50,000 Units by mouth once a week.     diltiazem (CARDIZEM) 120 MG tablet Take 120 mg by mouth once.     ELIQUIS 5 MG TABS tablet TAKE 1 TABLET BY MOUTH TWICE A DAY 180 tablet 3   FARXIGA 10 MG TABS tablet Take 10 mg by mouth daily.     ferrous sulfate 325 (65 FE) MG tablet Take 325 mg by mouth 2 (two) times daily with a meal.     furosemide (LASIX) 40 MG tablet TAKE 1 TABLET BY MOUTH EVERY DAY IN THE [name] 30 tablet 0   metFORMIN (GLUCOPHAGE) 500 MG tablet Take 500 mg by mouth 2 (two) times daily with a meal.   5   potassium chloride (KLOR-CON) 10 MEQ tablet Take 2 tablets (20 mEq total) by mouth  daily as needed (Take with furosemide only). 60 tablet 2   pravastatin (PRAVACHOL) 20 MG tablet Take 20 mg by mouth every evening.      vitamin B-12 (CYANOCOBALAMIN) 250 MCG tablet Take 1,000 mcg by mouth 2 (two) times daily.      doxazosin (CARDURA) 8 MG tablet Take 4 mg by mouth at bedtime.     lisinopril (PRINIVIL,ZESTRIL) 20 MG tablet Take 20 mg by mouth at bedtime.      diltiazem (CARDIZEM CD) 120 MG 24 hr capsule Take 120 mg by mouth 2 (two) times daily.   5   No facility-administered medications prior to visit.   Final Medications at End of Visit    Current Meds  Medication Sig   acetaminophen (TYLENOL) 500 MG tablet Take 500 mg by mouth every 6 (six) hours as needed for moderate pain.   albuterol (VENTOLIN HFA) 108 (90 Base) MCG/ACT inhaler Inhale 2 puffs into the lungs every 6 (six) hours as needed for wheezing or shortness of breath.   D3-50 1.25 MG (50000 UT) capsule Take 50,000 Units by mouth once a week.   diltiazem (CARDIZEM) 120 MG tablet Take 120 mg by mouth once.   ELIQUIS 5 MG TABS tablet TAKE 1 TABLET BY MOUTH TWICE A DAY   FARXIGA 10 MG TABS tablet Take 10 mg by mouth daily.   ferrous sulfate 325 (65 FE) MG tablet Take 325 mg by mouth 2 (two) times daily with a meal.   furosemide (LASIX) 40 MG tablet TAKE 1 TABLET BY MOUTH EVERY DAY IN THE [name]   metFORMIN (GLUCOPHAGE) 500 MG tablet Take 500 mg by mouth 2 (two) times daily with a meal.    potassium chloride (KLOR-CON) 10 MEQ tablet Take 2 tablets (20 mEq total) by mouth daily as needed (Take with furosemide only).   pravastatin (PRAVACHOL) 20 MG tablet Take 20 mg by mouth every evening.    vitamin B-12 (CYANOCOBALAMIN) 250 MCG tablet Take 1,000 mcg by mouth 2 (two) times daily.   [DISCONTINUED] doxazosin (CARDURA) 8 MG tablet Take 4 mg by mouth at bedtime.   [DISCONTINUED] lisinopril (PRINIVIL,ZESTRIL) 20 MG tablet Take 20 mg by mouth at bedtime.    Radiology:   No AAA by CTA abdomen in 2019.  CXR 12/02/2017: Chronic bibasilar densities, likely scarring.  Mild cardiomegaly.  Cardiac Studies:   PCV ECHOCARDIOGRAM COMPLETE 28/63/8177 Normal LV systolic function with visual EF 60-65%. Left ventricle cavity is normal in size. Moderate left  ventricular hypertrophy. Normal global wall motion. Unable to evaluate diastolic function due to atrial fibrillation. Elevated LAP. Left atrial cavity is mildly dilated. Right atrial cavity is visually moderate to severely dilated. IAS bows from right to left, suggestive of increased RAP. Right ventricle cavity is visually moderate to severely dilated. Grossly normal right ventricular function. Mild (Grade I) aortic regurgitation. Severe tricuspid regurgitation, eccentric jet towards the septum. Mild pulmonary hypertension, under-appreciated due to dilated RA size and possible equalization of pressures. RVSP measures 35 mmHg. Insignificant pericardial effusion. There is no hemodynamic significance. IVC is dilated with a respiratory response of >50%. Compared to study 01/04/2018 LVEF remains preserved but right heart findings are new (increased RA and RV size, severe TR, increased RAP, dilated IVC).   PCV MYOCARDIAL PERFUSION WITH LEXISCAN 07/05/2021 Non-diagnostic ECG stress due to pharmacologic stress. Underlying A. Fibrillation. Myocardial perfusion is normal. Overall LV systolic function is normal without regional wall motion abnormalities. Stress LV EF: 59%. No significant change from 11/16/2011 report. A. Fib is  new.  Low risk study.  EKG   06/06/2021: Atrial fibrillation with controlled ventricular response at rate of 76 bpm, rightward axis, anterolateral infarct old.  Single PVC.  Low-voltage complexes.  Pulmonary disease pattern.  Compared to 09/21/2020, previously leftward axis.  Assessment     ICD-10-CM   1. Acute on chronic respiratory failure with hypoxia (HCC)  J96.21 CBC    Basic metabolic panel    Brain natriuretic peptide    2. Cor pulmonale, chronic (HCC)  I27.81 CBC    Basic metabolic panel    Brain natriuretic peptide    3. Essential hypertension  I10      CHA2DS2-VASc Score is 4.  Yearly risk of stroke: 4.8% (A, HTN, DM).  Score of 1=0.6; 2=2.2; 3=3.2; 4=4.8; 5=7.2;  6=9.8; 7=>9.8) -(CHF; HTN; vasc disease DM,  Male = 1; Age <65 =0; 65-74 = 1,  >75 =2; stroke/embolism= 2).   Meds ordered this encounter  Medications   doxazosin (CARDURA) 4 MG tablet    Sig: Take 1 tablet (4 mg total) by mouth at bedtime.    Dispense:  90 tablet    Refill:  3   lisinopril (ZESTRIL) 10 MG tablet    Sig: Take 1 tablet (10 mg total) by mouth at bedtime.    Dispense:  90 tablet    Refill:  3     Medications Discontinued During This Encounter  Medication Reason   diltiazem (CARDIZEM CD) 120 MG 24 hr capsule    doxazosin (CARDURA) 8 MG tablet Reorder   lisinopril (PRINIVIL,ZESTRIL) 20 MG tablet Reorder   Recommendations:   JEREMAINE MARAJ  is a 76 y.o. AAM patient with permanent atrial fibrillation, hypertension, diabetes mellitus with peripheral arterial disease, ongoing tobacco use disorder with underlying COPD, obstructive sleep apnea, severe presently on CPAP and compliant with CPAP.   Patient presents for 6-week follow-up for results of cardiac testing.  Last office visit given worsening dyspnea as well as edema ordered echocardiogram and stress test.  Stress test was overall low risk.  Echocardiogram revealed LVEF 60-65% with RV findings consistent with chronic cor pulmonale likely secondary to patient's continued smoking and underlying COPD.  Also at last office visit patient's blood pressure was soft, therefore advised him to follow-up with PCP and consider discontinuing doxazosin however that is not happening.  Patient's blood pressure remains quite soft and he is having episodes of lightheadedness.  We will therefore reduce doxazosin from 8 mg to 4 mg and lisinopril from 20 mg to 10 mg.  On exam patient's volume status has significantly improved.  Leg edema has essentially resolved and his symptoms of dyspnea have significantly improved.  Reviewed and discussed results of stress test and echocardiogram, details above.  Given echocardiogram with findings of  pulmonary hypertension as well as recent CT scan showing enlarged pulmonary artery suggestive of PAH, discussed right heart catheterization.  I reviewed patient's case, chart, and imaging with Dr. Einar Gip who in agreement that right heart catheter is appropriate when patient is relatively euvolemic.  As patient is euvolemic on exam today discussed with him regarding indication, risk, benefits of right heart catheterization.  Patient verbalized understanding and wishes to proceed with the procedure.  We will obtain lab work prior to right heart cath including BNP, BMP, CBC.  In regard to hypertension advised patient to monitor blood pressure at home, he states he knows a registered nurse who will do this for him.  Patient will notify our office if blood pressure is >  130/80 mmHg, he will also continue to monitor his symptoms and notify us if lightheadedness does not improve.  Follow-up after right heart catheterization.   Alethia Berthold, PA-C 08/07/2021, 1:15 PM Office: [phone]

## 2021-08-16 ENCOUNTER — Other Ambulatory Visit: Payer: Self-pay | Admitting: Student

## 2021-08-16 DIAGNOSIS — I5031 Acute diastolic (congestive) heart failure: Secondary | ICD-10-CM

## 2021-08-17 ENCOUNTER — Other Ambulatory Visit: Payer: Self-pay | Admitting: *Deleted

## 2021-08-17 ENCOUNTER — Inpatient Hospital Stay: Payer: Medicare HMO

## 2021-08-17 ENCOUNTER — Other Ambulatory Visit: Payer: Self-pay

## 2021-08-17 ENCOUNTER — Inpatient Hospital Stay: Payer: Medicare HMO | Attending: Oncology | Admitting: Oncology

## 2021-08-17 VITALS — BP 106/72 | HR 84 | Temp 97.8°F | Resp 20 | Ht 75.0 in | Wt 204.6 lb

## 2021-08-17 DIAGNOSIS — E119 Type 2 diabetes mellitus without complications: Secondary | ICD-10-CM | POA: Insufficient documentation

## 2021-08-17 DIAGNOSIS — D72810 Lymphocytopenia: Secondary | ICD-10-CM | POA: Insufficient documentation

## 2021-08-17 DIAGNOSIS — Z79899 Other long term (current) drug therapy: Secondary | ICD-10-CM | POA: Diagnosis not present

## 2021-08-17 DIAGNOSIS — I4891 Unspecified atrial fibrillation: Secondary | ICD-10-CM | POA: Diagnosis not present

## 2021-08-17 DIAGNOSIS — D72819 Decreased white blood cell count, unspecified: Secondary | ICD-10-CM

## 2021-08-17 DIAGNOSIS — R590 Localized enlarged lymph nodes: Secondary | ICD-10-CM | POA: Diagnosis not present

## 2021-08-17 DIAGNOSIS — G4730 Sleep apnea, unspecified: Secondary | ICD-10-CM | POA: Diagnosis not present

## 2021-08-17 DIAGNOSIS — E538 Deficiency of other specified B group vitamins: Secondary | ICD-10-CM | POA: Diagnosis not present

## 2021-08-17 DIAGNOSIS — Z933 Colostomy status: Secondary | ICD-10-CM | POA: Insufficient documentation

## 2021-08-17 LAB — FERRITIN: Ferritin: 63 ng/mL (ref 24–336)

## 2021-08-17 LAB — CBC WITH DIFFERENTIAL (CANCER CENTER ONLY)
Abs Immature Granulocytes: 0.01 10*3/uL (ref 0.00–0.07)
Basophils Absolute: 0 10*3/uL (ref 0.0–0.1)
Basophils Relative: 1 %
Eosinophils Absolute: 0.1 10*3/uL (ref 0.0–0.5)
Eosinophils Relative: 2 %
HCT: 51.7 % (ref 39.0–52.0)
Hemoglobin: 15.6 g/dL (ref 13.0–17.0)
Immature Granulocytes: 0 %
Lymphocytes Relative: 22 %
Lymphs Abs: 0.6 10*3/uL — ABNORMAL LOW (ref 0.7–4.0)
MCH: 28.9 pg (ref 26.0–34.0)
MCHC: 30.2 g/dL (ref 30.0–36.0)
MCV: 95.7 fL (ref 80.0–100.0)
Monocytes Absolute: 0.2 10*3/uL (ref 0.1–1.0)
Monocytes Relative: 6 %
Neutro Abs: 2 10*3/uL (ref 1.7–7.7)
Neutrophils Relative %: 69 %
Platelet Count: 150 10*3/uL (ref 150–400)
RBC: 5.4 MIL/uL (ref 4.22–5.81)
RDW: 17.1 % — ABNORMAL HIGH (ref 11.5–15.5)
WBC Count: 2.9 10*3/uL — ABNORMAL LOW (ref 4.0–10.5)
nRBC: 0 % (ref 0.0–0.2)

## 2021-08-17 LAB — VITAMIN B12: Vitamin B-12: 409 pg/mL (ref 180–914)

## 2021-08-17 NOTE — Progress Notes (Signed)
[place] OFFICE PROGRESS NOTE   Diagnosis: Leukopenia  INTERVAL HISTORY:   Manuel [name] returns as scheduled.  He is taking vitamin B12 and ferrous sulfate.  The ferritin was low when he was here in October.  He denies bleeding.  Objective:  Vital signs in last 24 hours:  Blood pressure 106/72, pulse 84, temperature 97.8 F (36.6 C), temperature source Oral, resp. rate 20, height _0  (1.905 m), weight 204 lb 9.6 oz (92.8 kg), SpO2 98 %.    Lymphatics: No cervical, supraclavicular, axillary, or inguinal nodes Resp: Decreased breath sounds with end inspiratory rhonchi at the lower posterior chest bilaterally, no respiratory distress Cardio: Irregular GI: No hepatosplenomegaly Vascular: No leg edema   Lab Results:  Lab Results  Component Value Date   WBC 2.9 (L) 08/17/2021   HGB 15.6 08/17/2021   HCT 51.7 08/17/2021   MCV 95.7 08/17/2021   PLT 150 08/17/2021   NEUTROABS 2.0 08/17/2021    CMP  Lab Results  Component Value Date   NA 141 05/17/2021   K 4.2 05/17/2021   CL 101 05/17/2021   CO2 35 (H) 05/17/2021   GLUCOSE 86 05/17/2021   BUN 16 05/17/2021   CREATININE 1.27 (H) 05/17/2021   CALCIUM 9.0 05/17/2021   PROT 6.8 05/17/2021   ALBUMIN 3.8 05/17/2021   AST 14 (L) 05/17/2021   ALT 12 05/17/2021   ALKPHOS 82 05/17/2021   BILITOT 1.2 05/17/2021   GFRNONAA 59 (L) 05/17/2021   GFRAA >60 06/01/2018     Medications: I have reviewed the patient's current medications.   Assessment/Plan: Leukopenia/lymphopenia B12 deficiency Diabetes Sleep apnea History of colon obstruction due to diverticular disease status post Hartman's procedure with colostomy 12/13/2017; colostomy reversal 05/29/2018 Atrial fibrillation Low ferritin October 2022 Enlarged mediastinal lymph node on chest CT 07/27/2021 Nodular liver suggestive of cirrhosis on chest CT 07/27/2021    Disposition: Manuel [name] was referred for evaluation of leukopenia.  He has lymphopenia,  likely a benign finding.  He is not anemic and the neutrophil count is normal.  He had a low ferritin when he was here in October 2022.  He is taking iron.  We will follow-up on the ferritin level from today.  We will check a stool Hemoccult evaluation.  I will refer him to Dr. Almyra Free if the stool Hemoccult returns positive.  There was a question of cirrhosis on a chest CT 07/27/2021.  There was also an enlarged mediastinal lymph node.  I will defer follow-up of these findings to his primary provider.  There is no clinical evidence of a lymphoproliferative disorder.  Betsy Coder, MD  08/17/2021  11:55 AM

## 2021-08-18 ENCOUNTER — Ambulatory Visit: Payer: Medicare HMO | Admitting: Cardiology

## 2021-08-18 DIAGNOSIS — I2781 Cor pulmonale (chronic): Secondary | ICD-10-CM

## 2021-08-18 DIAGNOSIS — I10 Essential (primary) hypertension: Secondary | ICD-10-CM

## 2021-08-18 NOTE — Progress Notes (Signed)
Chief Complaint  Patient presents with   Hypertension   Blood Pressure Check     ICD-10-CM   1. Cor pulmonale, chronic (HCC)  I27.81     2. Primary hypertension  I10       Vitals with BMI 08/17/2021 08/07/2021 06/29/2021  Height 6' 3" 6' 3" 6' 3"  Weight 204 lbs 10 oz 204 lbs 221 lbs  BMI 25.57 88.8 75.79  Systolic 728 94 206  Diastolic 72 63 60  Pulse 84 75 81   Orthostatic VS for the past 72 hrs (Last 3 readings):  Orthostatic BP Patient Position BP Location Cuff Size Orthostatic Pulse  08/18/21 1205 114/71 Sitting Right Arm Normal 76  08/18/21 1204 97/59 Sitting Left Arm Normal 77     Blood pressure is stable and no dizziness, remains asymptomatic.  Continue present medication, keep all follow-up appointments.  Mild blood pressure differential with decreased blood pressure left arm and may indicate asymptomatic left subclavian artery stenosis.

## 2021-08-18 NOTE — H&P (View-Only) (Signed)
Chief Complaint  Patient presents with   Hypertension   Blood Pressure Check     ICD-10-CM   1. Cor pulmonale, chronic (HCC)  I27.81     2. Primary hypertension  I10       Vitals with BMI 08/17/2021 08/07/2021 06/29/2021  Height 6' 3" 6' 3" 6' 3"  Weight 204 lbs 10 oz 204 lbs 221 lbs  BMI 25.57 88.8 75.79  Systolic 728 94 206  Diastolic 72 63 60  Pulse 84 75 81   Orthostatic VS for the past 72 hrs (Last 3 readings):  Orthostatic BP Patient Position BP Location Cuff Size Orthostatic Pulse  08/18/21 1205 114/71 Sitting Right Arm Normal 76  08/18/21 1204 97/59 Sitting Left Arm Normal 77     Blood pressure is stable and no dizziness, remains asymptomatic.  Continue present medication, keep all follow-up appointments.  Mild blood pressure differential with decreased blood pressure left arm and may indicate asymptomatic left subclavian artery stenosis.

## 2021-08-21 ENCOUNTER — Other Ambulatory Visit: Payer: Self-pay

## 2021-08-21 ENCOUNTER — Encounter: Payer: Self-pay | Admitting: Pulmonary Disease

## 2021-08-21 ENCOUNTER — Ambulatory Visit: Payer: Medicare HMO | Admitting: Pulmonary Disease

## 2021-08-21 ENCOUNTER — Ambulatory Visit (INDEPENDENT_AMBULATORY_CARE_PROVIDER_SITE_OTHER): Payer: Medicare HMO | Admitting: Pulmonary Disease

## 2021-08-21 VITALS — BP 126/82 | HR 79 | Temp 98.0°F | Ht 74.5 in | Wt 201.0 lb

## 2021-08-21 DIAGNOSIS — R0902 Hypoxemia: Secondary | ICD-10-CM | POA: Diagnosis not present

## 2021-08-21 DIAGNOSIS — J9611 Chronic respiratory failure with hypoxia: Secondary | ICD-10-CM

## 2021-08-21 DIAGNOSIS — I2781 Cor pulmonale (chronic): Secondary | ICD-10-CM

## 2021-08-21 NOTE — Patient Instructions (Addendum)
Nice to see you again  Continue to use oxygen to maintain or keep the saturation above 88%.  You can buy a finger pulse oximeter to check the oxygen saturation.  Check it at rest and when walking around.  The oxygen saturation must be above 88% at all times.  If you find that its above 88% at rest or when he is exerting himself please let me know.  It is possible the oxygen will improve with the amount of diuresis or water weight that has been lost with the Lasix.  I will send Dr. Einar Gip a note and I will be on the look out for the results of the catheterization scheduled for tomorrow.  Return to clinic in 3 months or sooner as needed with Dr. Silas Flood

## 2021-08-21 NOTE — Patient Instructions (Signed)
Full PFT performed today. °

## 2021-08-21 NOTE — Progress Notes (Signed)
Full PFT performed today.

## 2021-08-21 NOTE — Progress Notes (Signed)
_0  ID: Manuel [name], male    DOB: [birth year], 76 y.o.   MRN: [id number]  Chief Complaint  Patient presents with   Follow-up    Follow up for hypoxia. Pt states he is doing better and just needs education on his oxygen. He states he forgot how to use it     Referring provider: [name], DO  HPI:   76 y.o. man whom we are seeing in consultation for evaluation of abnormal chest imaging, wheezing.  Recent hematology note reviewed.  Most recent cardiology note x2 reviewed.  Overall symptoms unchanged.  Again he did not endorse much dyspnea at last visit.  Found to be pretty hypoxemic.  Oxygen was ordered.  He is not using this. 1)He does not really understand how to use the tanks.  2) he does not really feel like he needs it based on his symptoms.  Reviewed his TTE earlier in the month that shows RV dilation, RA dilation, normal RV function with estimated elevated PASP in the setting of left atrial dilation as well as elevated left atrial pressure that was estimated on TTE.  Discussed that keeping oxygen at safe range is imperative to reduce stress on the heart.  He has right heart catheterization scheduled for tomorrow.  Reviewed results of CT high-res with shows no evidence of ILD, does show apical predominant emphysematous changes, small bilateral right greater than left effusions.  Reviewed pulmonary function sedated are suggestive of moderate restriction on spirometry versus gas trapping, TLC is moderately reduced but gas trapping is present based on elevated RV/TLC ratio, DLCO moderately reduced.  HPI at initial visit: Patient feels well today.  He denies any significant dyspnea.  No shortness of breath.  Relatively asymptomatic.  History largely gathered via EMR.  He was seen by cardiology 11-15/22.  At that point time he was complaining of marked dyspnea on exertion over the last 4 to 6 weeks.  Also with orthopnea.  Increased lower extremity swelling.  Still smoking at that time.   The note from that cardiology visit states his oxygen saturation was low at that visit as well at the time of his PCP visit about a week prior.  Is placed on Lasix.  Current dose 40 mg twice a day.  Was seen 06/20/2021 by cardiology with appears to be improvement in exam.  Was to continue Lasix 40 mg twice daily.  He says he is doing that.  He had a chest x-ray 06/09/2021 reviewed interpreted as small to moderate bilateral loculated pleural effusions, increased interstitial markings may represent edema in the setting of bilateral effusions.  He reports about a 15-pack-year smoking history.  Quit a few weeks ago.  Diet he denies any recurrent or repeated pneumonias as a child or adult.  No frequent bouts of bronchitis as a child or a kid.  He does endorse frequent atopic symptoms, seasonal allergies.  Does not use any medicine just powers through.  Had hayfever a lot as a child.   On intake, O2 sat low 90s on room air but desaturated to 77% with talking.  Repeat O2 sat with forehead probe 84 to 85%.  He was placed on 1 L nasal cannula with oxygen saturation 94%.  He was ambulated around the clinic with desaturation to the mid 80s.  2 L nasal cannula via POC maintain oxygen saturations around 94%.  New prescription for oxygen today.  Discussed at length that risks and benefits of oxygen and refusing oxygen.  He does  not seem thrilled with the idea of using oxygen, he seemed to indicate he would likely not use it.  Again I counseled on the importance of using oxygen and the risk of not using oxygen at home.  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  MMRC: No flowsheet data found.  Epworth:  No flowsheet data found.  Tests:   FENO:  No results found for: NITRICOXIDE  PFT: No flowsheet data found.  WALK:  No flowsheet data found.  Imaging: CT Chest High Resolution  Result Date: 07/27/2021 CLINICAL DATA:  Acute, possibly chronic, hypoxemic respiratory failure, decreased O2 sats. EXAM: CT CHEST WITHOUT CONTRAST TECHNIQUE: Multidetector CT imaging of the chest was performed following the standard protocol without intravenous contrast. High resolution imaging of the lungs, as well as inspiratory and expiratory imaging, was performed. COMPARISON:  CT abdomen pelvis 01/02/2018. FINDINGS: Cardiovascular: Atherosclerotic calcification of the aorta and coronary arteries. Pulmonic trunk and heart are enlarged. No pericardial effusion. Mediastinum/Nodes: Mediastinal lymph nodes measure up to 1.4 cm in the AP window. Hilar regions are difficult to evaluate without IV contrast. No axillary adenopathy. Tortuous enlarged vessels are seen in both axillary regions. Esophagus is grossly unremarkable. Lungs/Pleura: Centrilobular and paraseptal emphysema. 6 mm right middle lobe nodule (4/100), unchanged from 01/02/2018 and benign. Small, somewhat complex appearing right pleural effusion with adjacent rounded consolidation and parenchymal volume loss in the right lower lobe. No definitive subpleural reticulation, traction bronchiectasis/bronchiolectasis, ground-glass, architectural distortion or honeycombing. 4 mm subpleural left lower lobe nodule (4/111), also unchanged and benign. No left pleural fluid. Dependent debris in the right mainstem bronchus. No air trapping. Upper Abdomen: Liver margin is irregular. Visualized portions of the adrenal glands, spleen, pancreas, stomach and bowel are grossly unremarkable. Musculoskeletal: Degenerative changes in the spine. No worrisome lytic or sclerotic lesions.  IMPRESSION: 1. No evidence of interstitial lung disease. 2. Small right fibrothorax with adjacent right lower lobe rounded atelectasis. 3. Mediastinal adenopathy is indeterminate. A lymphoproliferative disorder cannot be excluded. Consider follow-up CT chest with contrast in 3 months, as clinically indicated. 4. Cirrhosis. 5.  Emphysema (ICD10-J43.9). 6. Aortic atherosclerosis (ICD10-I70.0). Coronary artery calcification. 7. Enlarged pulmonic trunk, indicative of pulmonary arterial hypertension. Electronically Signed   By: Lorin Picket M.D.   On: 07/27/2021 11:33    Lab Results:  CBC    Component Value Date/Time   WBC 2.9 (L) 08/17/2021 1057   WBC 5.1 06/02/2018 0419   RBC 5.40 08/17/2021 1057   HGB 15.6 08/17/2021 1057   HCT 51.7 08/17/2021 1057   PLT 150 08/17/2021 1057   MCV 95.7 08/17/2021 1057   MCH 28.9 08/17/2021 1057   MCHC 30.2 08/17/2021 1057   RDW 17.1 (H) 08/17/2021 1057   LYMPHSABS 0.6 (L) 08/17/2021 1057   MONOABS 0.2 08/17/2021 1057   EOSABS 0.1 08/17/2021 1057   BASOSABS 0.0 08/17/2021 1057    BMET    Component Value Date/Time   NA 141 05/17/2021 1306   K 4.2 05/17/2021 1306   CL 101 05/17/2021 1306   CO2 35 (H) 05/17/2021 1306   GLUCOSE 86 05/17/2021 1306   BUN 16 05/17/2021 1306   CREATININE 1.27 (H) 05/17/2021 1306  CREATININE 1.23 04/10/2011 1307   CALCIUM 9.0 05/17/2021 1306   GFRNONAA 59 (L) 05/17/2021 1306   GFRAA >60 06/01/2018 0407    BNP    Component Value Date/Time   BNP 359.2 (H) 06/06/2021 1625    ProBNP No results found for: PROBNP  Specialty Problems       Pulmonary Problems   OSA (obstructive sleep apnea)   Hypoxia    Allergies  Allergen Reactions   Sulfa Antibiotics Shortness Of Breath    Headaches     Immunization History  Administered Date(s) Administered   Influenza, High Dose Seasonal PF 06/13/2015, 06/19/2016   Influenza, Quadrivalent, Recombinant, Inj, Pf 04/26/2017, 03/25/2019, 05/27/2020, 03/31/2021    Moderna Sars-Covid-2 Vaccination 08/26/2019, 09/23/2019   Pneumococcal Conjugate-13 02/18/2017   Td 08/24/2009, 06/21/2016   Zoster Recombinat (Shingrix) 03/20/2021, 06/21/2021    Past Medical History:  Diagnosis Date   AAA (abdominal aortic aneurysm)    per cardiologist note dated 03-14-2018 last duplex , 30-64m  (dr gEinar Gip   Acute GI bleeding 12/19/2017   Allergic rhinitis, seasonal    Anemia secondary to renal failure    Anticoagulant long-term use    ELIQUIS   Arthritis    LOWER BACK   B12 deficiency    BPH (benign prostatic hyperplasia)    CKD (chronic kidney disease), stage III ([place]    Colonic stricture ([facility] 12/13/2017   Colostomy present ([facility]    Congenital absence of left kidney [facility] dr detarding   per CT 12-11-2017 and 01-02-2018   Duodenal ulcer 12/20/2017   Dyslipidemia    Esophageal candidiasis ([facility] 12/19/2017   Gastric ulcer without hemorrhage or perforation 12/20/2017   History of bowel resection 12/13/2017   for sigmoid stricture-- emergency sigmoidectomy   Hypercholesteremia 09/19/2018   Hyperparathyroidism, secondary renal ([facility]    Hypertension    Left carotid bruit    OSA on CPAP    followed by [facility]   Permanent atrial fibrillation ([facility] 2017   cardiologist-  dr gEinar Gip  Solitary right kidney    Type 2 diabetes mellitus ([facility]    followed by pcp   Wears glasses    Wears partial dentures    UPPER AND LOWER    Tobacco History: Social History   Tobacco Use  Smoking Status Former   Packs/day: 0.50   Years: 30.00   Pack years: 15.00   Types: Cigarettes  Smokeless Tobacco Never  Tobacco Comments   Patient stated the he quit smoking again right after last OV.   Counseling given: Not Answered Tobacco comments: Patient stated the he quit smoking again right after last OV.   Continue to not smoke  Outpatient Encounter Medications as of 08/21/2021  Medication Sig   acetaminophen  (TYLENOL) 500 MG tablet Take 500 mg by mouth every 6 (six) hours as needed for moderate pain.   albuterol (VENTOLIN HFA) 108 (90 Base) MCG/ACT inhaler Inhale 2 puffs into the lungs every 6 (six) hours as needed for wheezing or shortness of breath.   D3-50 1.25 MG (50000 UT) capsule Take 50,000 Units by mouth every Wednesday.   diltiazem (CARDIZEM) 120 MG tablet Take 120 mg by mouth in the morning and at bedtime.   doxazosin (CARDURA) 4 MG tablet Take 1 tablet (4 mg total) by mouth at bedtime.   ELIQUIS 5 MG TABS tablet TAKE 1 TABLET BY MOUTH TWICE A DAY   FARXIGA 10 MG TABS tablet Take 10 mg by mouth daily.  ferrous sulfate 325 (65 FE) MG tablet Take 325 mg by mouth daily.   furosemide (LASIX) 40 MG tablet TAKE 1 TABLET BY MOUTH EVERY DAY IN THE MORNING   lisinopril (ZESTRIL) 10 MG tablet Take 1 tablet (10 mg total) by mouth at bedtime.   metFORMIN (GLUCOPHAGE) 500 MG tablet Take 500 mg by mouth 2 (two) times daily with a meal.    potassium chloride (KLOR-CON) 10 MEQ tablet Take 2 tablets (20 mEq total) by mouth daily as needed (Take with furosemide only). (Patient taking differently: Take 20 mEq by mouth daily.)   pravastatin (PRAVACHOL) 20 MG tablet Take 20 mg by mouth every evening.    vitamin B-12 (CYANOCOBALAMIN) 1000 MCG tablet Take 1,000 mcg by mouth 2 (two) times daily.   No facility-administered encounter medications on file as of 08/21/2021.     Review of Systems  Review of Systems  N/a Physical Exam  BP 126/82 (BP Location: Left Arm, Patient Position: Sitting, Cuff Size: Normal)    Pulse 79    Temp 98 F (36.7 C) (Oral)    Ht 6' 2.5" (1.892 m)    Wt 201 lb (91.2 kg)    BMI 25.46 kg/m   Wt Readings from Last 5 Encounters:  08/21/21 201 lb (91.2 kg)  08/17/21 204 lb 9.6 oz (92.8 kg)  08/07/21 204 lb (92.5 kg)  06/29/21 221 lb (100.2 kg)  06/20/21 221 lb 12.8 oz (100.6 kg)    BMI Readings from Last 5 Encounters:  08/21/21 25.46 kg/m  08/17/21 25.57 kg/m  08/07/21  25.50 kg/m  06/29/21 27.62 kg/m  06/20/21 27.72 kg/m     Physical Exam General: Well-appearing, no acute distress Eyes: EOMI, icterus Neck: Supple, no JVP Pulmonary: Clear, normal work of breathing Cardiovascular: Irregularly irregular rhythm, regular rate, no murmurs MSK: No synovitis, no joint effusion Neuro: Normal gait, no weakness Psych: Normal mood, full affect.  Assessment & Plan:   Acute, possibly chronic, hypoxemic respiratory failure: Significant desaturation in the past.  87% today.  Not wearing oxygen prescribed in the past.  Weight down 20 to 30 pounds while taking Lasix.  Chest x-ray reveals bilateral pleural effusions, interstitial markings consistent with edema in my opinion.  CT high-res was obtained to rule out ILD which demonstrates no ILD but did show mild emphysematous changes as well as bilateral pleural effusions consistent with cardiogenic volume overload.  Also demonstrated possible cirrhosis of the liver.  PFTs with mixed gas trapping and restrictive physiology without evidence of intraparenchymal cause of restriction.  TTE revealed elevated right sided or estimated PASP with dilated RA and RV with preserved RV function.  Also demonstrated possible pursuing pulm hypertension contributing via VQ mismatch.  DLCO is reduced.  Presumed pulmonary hypertension: Based on TTE 07/2021.  Likely multifactorial contributors including group 2 disease given dilated left atrium, elevated left atrial pressure, evidence of cardiogenic volume overload with bilateral pleural effusions and pulmonary edema on imaging in the past.  Possible group 3 disease given his hypoxemia and mild emphysema as well as OSA.  Admittedly emphysematous findings are quite mild.   Group 1 disease possible especially in the setting of what appears to be cirrhosis on CT scan although not formally diagnosed.  This was put him at risk of portopulmonary hypertension.  Fortunately RV function is preserved.  Suspect  multifactorial contributions.  Suspect will be difficult to treat.   Return in about 3 months (around 11/19/2021).   Lanier Clam, MD 08/21/2021   I spent  35 minutes in the care of this patient including review of records, face-to-face visit (majority of time), and coordination of care.

## 2021-08-22 ENCOUNTER — Encounter (HOSPITAL_COMMUNITY)
Admission: RE | Disposition: A | Discharge status: Home / Self Care | Payer: Self-pay | Source: Ambulatory Visit | Attending: Cardiology

## 2021-08-22 ENCOUNTER — Other Ambulatory Visit: Payer: Self-pay

## 2021-08-22 ENCOUNTER — Encounter (HOSPITAL_COMMUNITY): Payer: Self-pay | Admitting: Cardiology

## 2021-08-22 ENCOUNTER — Ambulatory Visit (HOSPITAL_COMMUNITY)
Admission: RE | Admit: 2021-08-22 | Discharge: 2021-08-22 | Disposition: A | Discharge status: Home / Self Care | Payer: Medicare HMO | Source: Ambulatory Visit | Attending: Cardiology | Admitting: Cardiology

## 2021-08-22 DIAGNOSIS — I2781 Cor pulmonale (chronic): Secondary | ICD-10-CM | POA: Diagnosis present

## 2021-08-22 DIAGNOSIS — R0609 Other forms of dyspnea: Secondary | ICD-10-CM | POA: Clinically undetermined

## 2021-08-22 DIAGNOSIS — I10 Essential (primary) hypertension: Secondary | ICD-10-CM | POA: Diagnosis not present

## 2021-08-22 DIAGNOSIS — E785 Hyperlipidemia, unspecified: Secondary | ICD-10-CM | POA: Diagnosis not present

## 2021-08-22 DIAGNOSIS — I2723 Pulmonary hypertension due to lung diseases and hypoxia: Secondary | ICD-10-CM | POA: Insufficient documentation

## 2021-08-22 HISTORY — PX: RIGHT HEART CATH: CATH118263

## 2021-08-22 LAB — POCT I-STAT EG7
Acid-Base Excess: 5 mmol/L — ABNORMAL HIGH (ref 0.0–2.0)
Acid-Base Excess: 5 mmol/L — ABNORMAL HIGH (ref 0.0–2.0)
Acid-Base Excess: 5 mmol/L — ABNORMAL HIGH (ref 0.0–2.0)
Bicarbonate: 33.2 mmol/L — ABNORMAL HIGH (ref 20.0–28.0)
Bicarbonate: 33.3 mmol/L — ABNORMAL HIGH (ref 20.0–28.0)
Bicarbonate: 33.4 mmol/L — ABNORMAL HIGH (ref 20.0–28.0)
Calcium, Ion: 1.15 mmol/L (ref 1.15–1.40)
Calcium, Ion: 1.17 mmol/L (ref 1.15–1.40)
Calcium, Ion: 1.19 mmol/L (ref 1.15–1.40)
HCT: 46 % (ref 39.0–52.0)
HCT: 47 % (ref 39.0–52.0)
HCT: 47 % (ref 39.0–52.0)
Hemoglobin: 15.6 g/dL (ref 13.0–17.0)
Hemoglobin: 16 g/dL (ref 13.0–17.0)
Hemoglobin: 16 g/dL (ref 13.0–17.0)
O2 Saturation: 64 %
O2 Saturation: 65 %
O2 Saturation: 66 %
Potassium: 3.5 mmol/L (ref 3.5–5.1)
Potassium: 3.6 mmol/L (ref 3.5–5.1)
Potassium: 3.6 mmol/L (ref 3.5–5.1)
Sodium: 144 mmol/L (ref 135–145)
Sodium: 145 mmol/L (ref 135–145)
Sodium: 145 mmol/L (ref 135–145)
TCO2: 35 mmol/L — ABNORMAL HIGH (ref 22–32)
TCO2: 35 mmol/L — ABNORMAL HIGH (ref 22–32)
TCO2: 35 mmol/L — ABNORMAL HIGH (ref 22–32)
pCO2, Ven: 64.1 mmHg — ABNORMAL HIGH (ref 44.0–60.0)
pCO2, Ven: 64.5 mmHg — ABNORMAL HIGH (ref 44.0–60.0)
pCO2, Ven: 65 mmHg — ABNORMAL HIGH (ref 44.0–60.0)
pH, Ven: 7.318 (ref 7.250–7.430)
pH, Ven: 7.319 (ref 7.250–7.430)
pH, Ven: 7.323 (ref 7.250–7.430)
pO2, Ven: 37 mmHg (ref 32.0–45.0)
pO2, Ven: 37 mmHg (ref 32.0–45.0)
pO2, Ven: 38 mmHg (ref 32.0–45.0)

## 2021-08-22 LAB — BASIC METABOLIC PANEL
Anion gap: 7 (ref 5–15)
BUN: 15 mg/dL (ref 8–23)
CO2: 31 mmol/L (ref 22–32)
Calcium: 9.1 mg/dL (ref 8.9–10.3)
Chloride: 104 mmol/L (ref 98–111)
Creatinine, Ser: 1.29 mg/dL — ABNORMAL HIGH (ref 0.61–1.24)
GFR, Estimated: 58 mL/min — ABNORMAL LOW (ref >60)
Glucose, Bld: 93 mg/dL (ref 70–99)
Potassium: 4.6 mmol/L (ref 3.5–5.1)
Sodium: 142 mmol/L (ref 135–145)

## 2021-08-22 LAB — GLUCOSE, CAPILLARY
Glucose-Capillary: 94 mg/dL (ref 70–99)
Glucose-Capillary: 75 mg/dL (ref 70–99)

## 2021-08-22 LAB — ANTINUCLEAR ANTIBODIES, IFA: ANA Ab, IFA: NEGATIVE

## 2021-08-22 SURGERY — RIGHT HEART CATH

## 2021-08-22 MED ORDER — SODIUM CHLORIDE 0.9 % IV SOLN: INTRAVENOUS | Status: DC

## 2021-08-22 MED ORDER — SODIUM CHLORIDE 0.9% FLUSH: 3.0000 mL | INTRAVENOUS | Status: DC | PRN

## 2021-08-22 MED ORDER — LIDOCAINE HCL (PF) 1 % IJ SOLN
INTRAMUSCULAR | Status: DC | PRN
  Administered 2021-08-22: 2 mL via INTRADERMAL

## 2021-08-22 MED ORDER — HEPARIN (PORCINE) IN NACL 1000-0.9 UT/500ML-% IV SOLN
INTRAVENOUS | Status: AC
  Filled 2021-08-22: qty 500

## 2021-08-22 MED ORDER — SODIUM CHLORIDE 0.9 % IV SOLN: 250.0000 mL | INTRAVENOUS | Status: DC | PRN

## 2021-08-22 MED ORDER — SODIUM CHLORIDE 0.9% FLUSH: 3.0000 mL | Freq: Two times a day (BID) | INTRAVENOUS | Status: DC

## 2021-08-22 MED ORDER — HEPARIN (PORCINE) IN NACL 1000-0.9 UT/500ML-% IV SOLN
INTRAVENOUS | Status: DC | PRN
  Administered 2021-08-22: 500 mL

## 2021-08-22 MED ORDER — LIDOCAINE HCL (PF) 1 % IJ SOLN
INTRAMUSCULAR | Status: AC
  Filled 2021-08-22: qty 30

## 2021-08-22 SURGICAL SUPPLY — 7 items
CATH BALLN WEDGE 5F 110CM (CATHETERS) ×1 IMPLANT
GUIDEWIRE .025 260CM (WIRE) ×1 IMPLANT
KIT HEART LEFT (KITS) ×1 IMPLANT
SHEATH GLIDE SLENDER 4/5FR (SHEATH) ×1 IMPLANT
SHEATH PROBE COVER 6X72 (BAG) ×1 IMPLANT
TRANSDUCER W/STOPCOCK (MISCELLANEOUS) ×1 IMPLANT
TUBING ART PRESS 72  MALE/MALE (TUBING) ×1 IMPLANT

## 2021-08-22 NOTE — Interval H&P Note (Signed)
History and Physical Interval Note:  08/22/2021 11:09 AM  Manuel [name]  has presented today for surgery, with the diagnosis of hp - doe.  The various methods of treatment have been discussed with the patient and family. After consideration of risks, benefits and other options for treatment, the patient has consented to  Procedure(s): RIGHT HEART CATH (N/A) as a surgical intervention.  The patient's history has been reviewed, patient examined, no change in status, stable for surgery.  I have reviewed the patient's chart and labs.  Questions were answered to the patient's satisfaction.     Adrian Prows

## 2021-08-22 NOTE — Discharge Instructions (Signed)
Keep dressing on for 24 hours. After that you may shower. After showering DO NOT put creams, lotions, ointments or bandages on site. Watch for signs of infection (fever, redness, drainage, etc). If bleeding, hold pressure and call office.

## 2021-08-31 DIAGNOSIS — N1831 Chronic kidney disease, stage 3a: Secondary | ICD-10-CM | POA: Diagnosis not present

## 2021-08-31 DIAGNOSIS — K7460 Unspecified cirrhosis of liver: Secondary | ICD-10-CM | POA: Diagnosis not present

## 2021-08-31 DIAGNOSIS — J9601 Acute respiratory failure with hypoxia: Secondary | ICD-10-CM | POA: Diagnosis not present

## 2021-09-02 DIAGNOSIS — G4733 Obstructive sleep apnea (adult) (pediatric): Secondary | ICD-10-CM | POA: Diagnosis not present

## 2021-09-04 LAB — PULMONARY FUNCTION TEST
DL/VA % pred: 94 %
DL/VA: 3.69 ml/min/mmHg/L
DLCO cor % pred: 50 %
DLCO cor: 14.76 ml/min/mmHg
DLCO unc % pred: 52 %
DLCO unc: 15.16 ml/min/mmHg
FEF 25-75 Post: 0.85 L/sec
FEF 25-75 Pre: 0.95 L/sec
FEF2575-%Change-Post: -10 %
FEF2575-%Pred-Post: 30 %
FEF2575-%Pred-Pre: 34 %
FEV1-%Change-Post: 0 %
FEV1-%Pred-Post: 47 %
FEV1-%Pred-Pre: 46 %
FEV1-Post: 1.61 L
FEV1-Pre: 1.59 L
FEV1FVC-%Change-Post: 8 %
FEV1FVC-%Pred-Pre: 89 %
FEV6-%Change-Post: -8 %
FEV6-%Pred-Post: 50 %
FEV6-%Pred-Pre: 54 %
FEV6-Post: 2.17 L
FEV6-Pre: 2.36 L
FEV6FVC-%Change-Post: -1 %
FEV6FVC-%Pred-Post: 103 %
FEV6FVC-%Pred-Pre: 104 %
FVC-%Change-Post: -6 %
FVC-%Pred-Post: 48 %
FVC-%Pred-Pre: 52 %
FVC-Post: 2.2 L
FVC-Pre: 2.36 L
Post FEV1/FVC ratio: 73 %
Post FEV6/FVC ratio: 99 %
Pre FEV1/FVC ratio: 68 %
Pre FEV6/FVC Ratio: 100 %
RV % pred: 86 %
RV: 2.44 L
TLC % pred: 57 %
TLC: 4.66 L

## 2021-09-05 ENCOUNTER — Other Ambulatory Visit: Payer: Self-pay

## 2021-09-05 ENCOUNTER — Ambulatory Visit: Payer: Medicare HMO | Admitting: Student

## 2021-09-05 ENCOUNTER — Encounter: Payer: Self-pay | Admitting: Student

## 2021-09-05 VITALS — BP 115/67 | HR 65 | Temp 98.0°F | Resp 17 | Ht 74.0 in | Wt 206.4 lb

## 2021-09-05 DIAGNOSIS — I2781 Cor pulmonale (chronic): Secondary | ICD-10-CM | POA: Diagnosis not present

## 2021-09-05 DIAGNOSIS — I10 Essential (primary) hypertension: Secondary | ICD-10-CM

## 2021-09-05 DIAGNOSIS — I4821 Permanent atrial fibrillation: Secondary | ICD-10-CM | POA: Diagnosis not present

## 2021-09-05 NOTE — Progress Notes (Signed)
Primary Physician/Referring:  [name], DO  Patient ID: Manuel [name], male    DOB: [birth year], 76 y.o.   MRN: [id number]  Chief Complaint  Patient presents with   Follow-up    Post cath   HPI:    Manuel [name]  is a 76 y.o. AAM patient with permanent atrial fibrillation, hypertension, diabetes mellitus with peripheral arterial disease, ongoing tobacco use disorder with underlying COPD, obstructive sleep apnea, severe presently on CPAP and compliant with CPAP, He had colonic stricture and pneumatosis leading to obstruction on 12/13/2017 and had colostomy which was reversed.   Echocardiogram 06/2021 revealed RV findings consistent with chronic cor pulmonale likely secondary to patient's smoking and underlying COPD.  Patient subsequently underwent right heart catheterization which revealed findings consistent with who group 3 mild pulmonary hypertension.  He now presents for follow-up.  Patient reports overall he is feeling well, states he feels his energy level has significantly improved.  He has had no recurrence of dizziness or lightheadedness.  Has had no recurrence of leg edema.  Overall patient is without specific complaints.  She reports he is continue to refrain from smoking since November 2022.  Past Medical History:  Diagnosis Date   AAA (abdominal aortic aneurysm)    per cardiologist note dated 03-14-2018 last duplex , 30-20m  (dr gEinar Gip   Acute GI bleeding 12/19/2017   Allergic rhinitis, seasonal    Anemia secondary to renal failure    Anticoagulant long-term use    ELIQUIS   Arthritis    LOWER BACK   B12 deficiency    BPH (benign prostatic hyperplasia)    CKD (chronic kidney disease), stage III ([facility]    [facility] [place] [place]   [place] ([facility]    Congenital absence of left kidney [facility] dr detarding   per CT 12-11-2017 and 01-02-2018   Duodenal ulcer 12/20/2017   Dyslipidemia    Esophageal  candidiasis ([facility] 12/19/2017   Gastric ulcer without hemorrhage or perforation 12/20/2017   History of bowel resection 12/13/2017   for sigmoid stricture-- emergency sigmoidectomy   Hypercholesteremia 09/19/2018   Hyperparathyroidism, secondary renal ([facility]    Hypertension    Left carotid bruit    OSA on CPAP    followed by [facility]   Permanent atrial fibrillation ([facility] 2017   cardiologist-  dr gEinar Gip  Solitary right kidney    Type 2 diabetes mellitus ([facility] [place]    followed by pcp   Wears glasses    Wears partial dentures    UPPER AND LOWER   Past Surgical History:  Procedure Laterality Date   BIOPSY  12/19/2017   Procedure: BIOPSY;  Surgeon: HCarol Ada MD;  [facility];  Service: Endoscopy;;   CARDIOVASCULAR STRESS TEST  11/16/2011   low risk nuclear study w/ no ischemia/  normal LV function and wall motion , ef 60%   CARDIOVERSION  12/18/2011   Procedure: CARDIOVERSION;  Surgeon: JLaverda Page MD;  [place] [facility]  Service: Cardiovascular;  Laterality: N/A;   CATARACT EXTRACTION W/ INTRAOCULAR LENS IMPLANT Right 2015   COLON RESECTION N/A 12/13/2017   Procedure: LAPAROSCOPIC END COLOSTOMY HARTMAN'S PROCEDURE;  Surgeon: TLeighton Ruff MD;  Location: [facility];  Service: General;  Laterality: N/A;   COLOSTOMY TAKEDOWN N/A 05/29/2018   Procedure: LAPAROSCOPIC COLOSTOMY REVERSAL ERAS PATHWAY, RIGID PROCTOSCOPY;  Surgeon: TLeighton Ruff MD;  Location: [facility];  Service: General;  Laterality: N/A;   CYST REMOVED FROM UPPER  BACK  09/2017   BENIGN   ESOPHAGOGASTRODUODENOSCOPY N/A 12/19/2017   Procedure: ESOPHAGOGASTRODUODENOSCOPY (EGD);  Surgeon: Carol Ada, MD;  Location: [facility];  Service: Endoscopy;  Laterality: N/A;   FLEXIBLE SIGMOIDOSCOPY N/A 12/13/2017   Procedure: FLEXIBLE SIGMOIDOSCOPY;  Surgeon: Carol Ada, MD;  Location: [facility];  Service: Endoscopy;  Laterality: N/A;   INGUINAL HERNIA REPAIR Left 1980s   AND REMOVAL CYST ON UPPER BACK    PAROTIDECTOMY Right 09-21-1999   dr Janace Hoard _0    salivery gland mass  (benign warthin's tumor)   RIGHT HEART CATH N/A 08/22/2021   Procedure: RIGHT HEART CATH;  Surgeon: Adrian Prows, MD;  Location: [place] [facility];  Service: Cardiovascular;  Laterality: N/A;   TRANSTHORACIC ECHOCARDIOGRAM  01/04/2018   mild LVH, ef 57-32%, grade 2 diastolic function/  trivial AR and MR/  mild LAE/  mild to moderate TR   Family History  Problem Relation Age of Onset   Cancer Mother    Cancer Father    Alzheimer's disease Father    Diabetes Brother    Kidney disease Paternal Aunt    Sleep apnea Neg Hx     Social History   Tobacco Use   Smoking status: Former    Packs/day: 0.50    Years: 30.00    Pack years: 15.00    Types: Cigarettes    Quit date: 2022    Years since quitting: 1.1   Smokeless tobacco: Never   Tobacco comments:    Patient stated the he quit smoking again right after last OV.  Substance Use Topics   Alcohol use: No    Alcohol/week: 0.0 standard drinks   ROS  Review of Systems  Constitutional: Negative for malaise/fatigue and weight gain.  Cardiovascular:  Negative for chest pain, claudication, dyspnea on exertion, leg swelling, near-syncope, orthopnea, palpitations, paroxysmal nocturnal dyspnea (improved) and syncope.  Respiratory:  Positive for snoring (OSA on CPAP). Negative for shortness of breath.   Gastrointestinal:  Negative for melena.  Neurological:  Negative for dizziness.  Objective  Blood pressure 115/67, pulse 65, temperature 98 F (36.7 C), temperature source Temporal, resp. rate 17, height _1  (1.88 m), weight 206 lb 6.4 oz (93.6 kg), SpO2 94 %.   SpO2: 94 %   Vitals with BMI 09/05/2021 08/22/2021 08/22/2021  Height _2  - -  Weight 206 lbs 6 oz - -  BMI 20.25 - -  Systolic 427 062 376  Diastolic 67 283 96  Pulse 65 - -     Physical Exam Vitals reviewed.  Neck:     Vascular: No carotid bruit or JVD.  Cardiovascular:     Rate and Rhythm: Rhythm  irregularly irregular.     Pulses:          Dorsalis pedis pulses are 0 on the right side and 0 on the left side.       Posterior tibial pulses are 0 on the right side and 0 on the left side.     Heart sounds: Murmur heard.  High-pitched blowing midsystolic murmur is present with a grade of 3/6 at the lower right sternal border and apex.    No gallop.     Comments: AC access site well-healed without ecchymosis, hematoma, bruit. Pulmonary:     Effort: Pulmonary effort is normal.     Breath sounds: No wheezing.  Musculoskeletal:     Right lower leg: Edema (trace) present.     Left lower leg: Edema (trace) present.  Laboratory examination:    CMP Latest Ref Rng & Units 08/22/2021 08/22/2021 08/22/2021  Glucose 70 - 99 mg/dL - - -  BUN 8 - 23 mg/dL - - -  Creatinine 0.61 - 1.24 mg/dL - - -  Sodium 135 - 145 mmol/L 145 145 144  Potassium 3.5 - 5.1 mmol/L 3.6 3.5 3.6  Chloride 98 - 111 mmol/L - - -  CO2 22 - 32 mmol/L - - -  Calcium 8.9 - 10.3 mg/dL - - -  Total Protein 6.5 - 8.1 g/dL - - -  Total Bilirubin 0.3 - 1.2 mg/dL - - -  Alkaline Phos 38 - 126 U/L - - -  AST 15 - 41 U/L - - -  ALT 0 - 44 U/L - - -   CBC Latest Ref Rng & Units 08/22/2021 08/22/2021 08/22/2021  WBC 4.0 - 10.5 K/uL - - -  Hemoglobin 13.0 - 17.0 g/dL 16.0 16.0 15.6  Hematocrit 39.0 - 52.0 % 47.0 47.0 46.0  Platelets 150 - 400 K/uL - - -   Lipid Panel  No results found for: CHOL, TRIG, HDL, CHOLHDL, VLDL, LDLCALC, LDLDIRECT HEMOGLOBIN A1C Lab Results  Component Value Date   HGBA1C 6.5 (H) 05/21/2018   MPG 139.85 05/21/2018   TSH No results for input(s): TSH in the last 8760 hours.  External labs:  06/13/2021: HCT 46.3, Hgb 14.2, MCV 93.2, platelet 115 BUN 19, creatinine 1.4, potassium 4.0, sodium 146, GFR 56 A1c 7.0% Total cholesterol 115, HDL 50, LDL 53, triglycerides 62 TSH 1.96  05/02/2021: TSH normal at 1.53.  Vitamin D markedly reduced at 7.4.  B12 510.  A1c 6.7%. Serum glucose 100 mg, BUN  13, creatinine 1.37, EGFR 58 mL, potassium 4.1. Hb 13.4/HCT 44.0, platelets 162. Urine analysis normal. Total cholesterol 123, triglycerides 61, HDL 46, LDL 65.  05/20/2020:  Hb 15.3/HCT 45.5, platelets 149. Sodium 147, potassium 4.8, serum glucose 113, BUN 17, creatinine 1.4, EGFR 63 mL, CMP otherwise normal. Total cholesterol 115, triglycerides 54, HDL 48, LDL 56. TSH normal.  07/08/2019:  Hb 15.3/HCT 48.6, platelets 174. Serum glucose 89 mg, BUN 14, creatinine 1.2, EGFR >60 mL, CMP normal. A1c 6.5%. Total cholesterol 115, triglycerides 48, HDL 47, LDL 58. Uric acid 6.5.   Allergies   Allergies  Allergen Reactions   Sulfa Antibiotics Shortness Of Breath    Headaches    Colchicine     Other reaction(s): diarrhea   Labetalol Hcl     Other reaction(s): dysuria   Tramadol Hcl     Other reaction(s): nausea    Medications Prior to Visit:   Outpatient Medications Prior to Visit  Medication Sig Dispense Refill   acetaminophen (TYLENOL) 500 MG tablet Take 500 mg by mouth every 6 (six) hours as needed for moderate pain.     albuterol (VENTOLIN HFA) 108 (90 Base) MCG/ACT inhaler Inhale 2 puffs into the lungs every 6 (six) hours as needed for wheezing or shortness of breath. 1 each 2   D3-50 1.25 MG (50000 UT) capsule Take 50,000 Units by mouth every Wednesday.     diltiazem (CARDIZEM) 120 MG tablet Take 120 mg by mouth in the morning and at bedtime.     doxazosin (CARDURA) 4 MG tablet Take 1 tablet (4 mg total) by mouth at bedtime. 90 tablet 3   ELIQUIS 5 MG TABS tablet TAKE 1 TABLET BY MOUTH TWICE A DAY 180 tablet 3   FARXIGA 10 MG TABS tablet Take 10 mg by mouth daily.  ferrous sulfate 325 (65 FE) MG tablet Take 325 mg by mouth daily.     furosemide (LASIX) 40 MG tablet TAKE 1 TABLET BY MOUTH EVERY DAY IN THE MORNING 30 tablet 0   lisinopril (ZESTRIL) 10 MG tablet Take 1 tablet (10 mg total) by mouth at bedtime. 90 tablet 3   metFORMIN (GLUCOPHAGE) 500 MG tablet Take 500 mg  by mouth 2 (two) times daily with a meal.   5   potassium chloride (KLOR-CON) 10 MEQ tablet Take 2 tablets (20 mEq total) by mouth daily as needed (Take with furosemide only). 60 tablet 2   pravastatin (PRAVACHOL) 20 MG tablet Take 20 mg by mouth every evening.      vitamin B-12 (CYANOCOBALAMIN) 1000 MCG tablet Take 1,000 mcg by mouth 2 (two) times daily.     No facility-administered medications prior to visit.   Final Medications at End of Visit    Current Meds  Medication Sig   acetaminophen (TYLENOL) 500 MG tablet Take 500 mg by mouth every 6 (six) hours as needed for moderate pain.   albuterol (VENTOLIN HFA) 108 (90 Base) MCG/ACT inhaler Inhale 2 puffs into the lungs every 6 (six) hours as needed for wheezing or shortness of breath.   D3-50 1.25 MG (50000 UT) capsule Take 50,000 Units by mouth every Wednesday.   diltiazem (CARDIZEM) 120 MG tablet Take 120 mg by mouth in the morning and at bedtime.   doxazosin (CARDURA) 4 MG tablet Take 1 tablet (4 mg total) by mouth at bedtime.   ELIQUIS 5 MG TABS tablet TAKE 1 TABLET BY MOUTH TWICE A DAY   FARXIGA 10 MG TABS tablet Take 10 mg by mouth daily.   ferrous sulfate 325 (65 FE) MG tablet Take 325 mg by mouth daily.   furosemide (LASIX) 40 MG tablet TAKE 1 TABLET BY MOUTH EVERY DAY IN THE MORNING   lisinopril (ZESTRIL) 10 MG tablet Take 1 tablet (10 mg total) by mouth at bedtime.   metFORMIN (GLUCOPHAGE) 500 MG tablet Take 500 mg by mouth 2 (two) times daily with a meal.    potassium chloride (KLOR-CON) 10 MEQ tablet Take 2 tablets (20 mEq total) by mouth daily as needed (Take with furosemide only).   pravastatin (PRAVACHOL) 20 MG tablet Take 20 mg by mouth every evening.    vitamin B-12 (CYANOCOBALAMIN) 1000 MCG tablet Take 1,000 mcg by mouth 2 (two) times daily.   Radiology:   No AAA by CTA abdomen in 2019.  CXR 12/02/2017: Chronic bibasilar densities, likely scarring.  Mild cardiomegaly.  Cardiac Studies:  Right heart catheterization  08/22/2021: RA pressure 8/9 mean 8 mm mercury. RA saturation 66%.  RV pressure 48/0 and Right ventricular EDP 7 mm Hg. PA pressure 48/15 with a mean of 30 mm mercury. PA saturation 65%.  Pulmonary capillary wedge 13/13 with a mean of 12 mm Hg. Aortic saturation 98%.  Cardiac output was 4.26 with cardiac index of 1.96 by Fick.  PVR 4.23 Wood units.  QP: QS = 0.97, no significant intracardiac shunting  Impression: Findings consistent with mild pulmonary hypertension, WHO group 3 with low pulmonary capillary wedge pressure.  PCV ECHOCARDIOGRAM COMPLETE 41/32/4401 Normal LV systolic function with visual EF 60-65%. Left ventricle cavity is normal in size. Moderate left ventricular hypertrophy. Normal global wall motion. Unable to evaluate diastolic function due to atrial fibrillation. Elevated LAP. Left atrial cavity is mildly dilated. Right atrial cavity is visually moderate to severely dilated. IAS bows from right to left, suggestive  of increased RAP. Right ventricle cavity is visually moderate to severely dilated. Grossly normal right ventricular function. Mild (Grade I) aortic regurgitation. Severe tricuspid regurgitation, eccentric jet towards the septum. Mild pulmonary hypertension, under-appreciated due to dilated RA size and possible equalization of pressures. RVSP measures 35 mmHg. Insignificant pericardial effusion. There is no hemodynamic significance. IVC is dilated with a respiratory response of >50%. Compared to study 01/04/2018 LVEF remains preserved but right heart findings are new (increased RA and RV size, severe TR, increased RAP, dilated IVC).   PCV MYOCARDIAL PERFUSION WITH LEXISCAN 07/05/2021 Non-diagnostic ECG stress due to pharmacologic stress. Underlying A. Fibrillation. Myocardial perfusion is normal. Overall LV systolic function is normal without regional wall motion abnormalities. Stress LV EF: 59%. No significant change from 11/16/2011 report. A. Fib is new.  Low risk  study.  EKG   06/06/2021: Atrial fibrillation with controlled ventricular response at rate of 76 bpm, rightward axis, anterolateral infarct old.  Single PVC.  Low-voltage complexes.  Pulmonary disease pattern.  Compared to 09/21/2020, previously leftward axis.  Assessment     ICD-10-CM   1. Cor pulmonale, chronic (HCC)  I27.81     2. Primary hypertension  I10     3. Permanent atrial fibrillation (HCC)  I48.21      CHA2DS2-VASc Score is 4.  Yearly risk of stroke: 4.8% (A, HTN, DM).  Score of 1=0.6; 2=2.2; 3=3.2; 4=4.8; 5=7.2; 6=9.8; 7=>9.8) -(CHF; HTN; vasc disease DM,  Male = 1; Age <65 =0; 65-74 = 1,  >75 =2; stroke/embolism= 2).   No orders of the defined types were placed in this encounter.    There are no discontinued medications.  Recommendations:   Manuel [name]  is a 76 y.o. AAM patient with permanent atrial fibrillation, hypertension, diabetes mellitus with peripheral arterial disease, ongoing tobacco use disorder with underlying COPD, obstructive sleep apnea, severe presently on CPAP and compliant with CPAP.   Echocardiogram 06/2021 revealed RV findings consistent with chronic cor pulmonale likely secondary to patient's smoking and underlying COPD.  Patient subsequently underwent right heart catheterization which revealed findings consistent with who group 3 mild pulmonary hypertension.  He now presents for follow-up.  Reviewed and discussed results of right heart catheterization, details above.  Patient is scheduled for follow-up visit with Dr. Silas Flood of pulmonology, will defer management of underlying COPD accordingly.  Patient's blood pressure is well controlled and he is relatively asymptomatic without clinical evidence of heart failure at this time.  Continue diltiazem, doxazosin, Eliquis, Farxiga, lisinopril, pravastatin, as well as Lasix.  In regard to atrial fibrillation, patient continues to tolerate anticoagulation without bleeding diathesis and remains well  rate controlled.  Follow-up in 3 months, sooner if needed.   Alethia Berthold, PA-C 09/05/2021, 11:25 AM Office: [phone]

## 2021-09-07 ENCOUNTER — Ambulatory Visit: Payer: Medicare HMO | Admitting: Adult Health

## 2021-09-07 ENCOUNTER — Encounter: Payer: Self-pay | Admitting: Adult Health

## 2021-09-07 VITALS — BP 144/84 | HR 92 | Ht 75.0 in | Wt 205.6 lb

## 2021-09-07 DIAGNOSIS — G4733 Obstructive sleep apnea (adult) (pediatric): Secondary | ICD-10-CM | POA: Diagnosis not present

## 2021-09-07 DIAGNOSIS — Z9989 Dependence on other enabling machines and devices: Secondary | ICD-10-CM | POA: Diagnosis not present

## 2021-09-07 NOTE — Patient Instructions (Signed)
Continue using CPAP nightly and greater than 4 hours each night If your symptoms worsen or you develop new symptoms please let us know.

## 2021-09-07 NOTE — Progress Notes (Signed)
PATIENT: Manuel [name] DOB: Apr 02, 1946  REASON FOR VISIT: follow up HISTORY FROM: patient   HISTORY OF PRESENT ILLNESS: Today 09/07/21:  Manuel [name] is a 76 year old male with a history of obstructive sleep apnea on CPAP.  He returns today for follow-up.  He reports that the CPAP is working well for him.  He denies any new issues.  Returns today for an evaluation.   05/24/21: Manuel [name] is a 76 y.o. male with a history of OSA on CPAP. He is here today for a follow up. He endorses that he is not sleeping well and has extreme levels of fatigue. His download reports that he is wearing his machine 100% of the time and for >4 hours each night. He has a set pressure setting of 8 cmH20 and has a poor treatment of AHI 28.4. In 2020, his pressure setting was also at 8 cmH20 and had good treatment of AHI. Patient has not had a mask refitting recently but he has no complaints on how his nasal pillows are fitting currently. He is changing his nasal pillows out monthly      HISTORY  05/24/20: Manuel [name] is a 76 year old male with a history of obstructive sleep apnea on CPAP.  He returns today for follow-up.  Unfortunately we were unable to obtain a download from [facility] today we have called them twice but still have not received the fax.  The patient states that he uses CPAP nightly.  Reports that last year during the last 1 he went without the CPAP and he can tell the difference.  He denies any new issues.  Returns today for an evaluation.   03/09/19: Manuel [name] is a 76 year old male with a history of obstructive sleep apnea on CPAP.  He returns today for follow-up.  His download indicates that he uses machine nightly for compliance of 100%.  He uses machine greater than 4 hours each night.  On average he uses his machine 7 hours and 52 minutes.  His residual AHI is 2.8 on 8 cm of water with EPR of 2.  His leak in the 95th percentile is 29.4 L/min.  Overall he reports that he is doing well.   Denies any new issues.    REVIEW OF SYSTEMS: Out of a complete 14 system review of symptoms, the patient complains only of the following symptoms, and all other reviewed systems are negative.   ESS 9  ALLERGIES: Allergies  Allergen Reactions   Sulfa Antibiotics Shortness Of Breath    Headaches    Colchicine     Other reaction(s): diarrhea   Labetalol Hcl     Other reaction(s): dysuria   Tramadol Hcl     Other reaction(s): nausea    HOME MEDICATIONS: Outpatient Medications Prior to Visit  Medication Sig Dispense Refill   acetaminophen (TYLENOL) 500 MG tablet Take 500 mg by mouth every 6 (six) hours as needed for moderate pain.     albuterol (VENTOLIN HFA) 108 (90 Base) MCG/ACT inhaler Inhale 2 puffs into the lungs every 6 (six) hours as needed for wheezing or shortness of breath. 1 each 2   D3-50 1.25 MG (50000 UT) capsule Take 50,000 Units by mouth every Wednesday.     diltiazem (CARDIZEM) 120 MG tablet Take 120 mg by mouth in the morning and at bedtime.     doxazosin (CARDURA) 4 MG tablet Take 1 tablet (4 mg total) by mouth at bedtime. 90 tablet 3   ELIQUIS 5 MG TABS tablet  TAKE 1 TABLET BY MOUTH TWICE A DAY 180 tablet 3   FARXIGA 10 MG TABS tablet Take 10 mg by mouth daily.     ferrous sulfate 325 (65 FE) MG tablet Take 325 mg by mouth daily.     furosemide (LASIX) 40 MG tablet TAKE 1 TABLET BY MOUTH EVERY DAY IN THE MORNING 30 tablet 0   lisinopril (ZESTRIL) 10 MG tablet Take 1 tablet (10 mg total) by mouth at bedtime. 90 tablet 3   metFORMIN (GLUCOPHAGE) 500 MG tablet Take 500 mg by mouth 2 (two) times daily with a meal.   5   pravastatin (PRAVACHOL) 20 MG tablet Take 20 mg by mouth every evening.      vitamin B-12 (CYANOCOBALAMIN) 1000 MCG tablet Take 1,000 mcg by mouth 2 (two) times daily.     potassium chloride (KLOR-CON) 10 MEQ tablet Take 2 tablets (20 mEq total) by mouth daily as needed (Take with furosemide only). 60 tablet 2   No facility-administered medications  prior to visit.    PAST MEDICAL HISTORY: Past Medical History:  Diagnosis Date   AAA (abdominal aortic aneurysm)    per cardiologist note dated 03-14-2018 last duplex , 30-64m  (dr gEinar Gip   Acute GI bleeding 12/19/2017   Allergic rhinitis, seasonal    Anemia secondary to renal failure    Anticoagulant long-term use    ELIQUIS   Arthritis    LOWER BACK   B12 deficiency    BPH (benign prostatic hyperplasia)    CKD (chronic kidney disease), stage III ([facility]    Colonic stricture ([facility] 12/13/2017   Colostomy present ([facility]    Congenital absence of left kidney [facility] det[facility]   per CT 12-11-2017 and 01-02-2018   Duodenal ulcer 12/20/2017   Dyslipidemia    Esophageal candidiasis ([facility] 12/19/2017   Gastric ulcer without hemorrhage or perforation 12/20/2017   History of bowel resection 12/13/2017   for sigmoid stricture-- emergency sigmoidectomy   Hypercholesteremia 09/19/2018   Hyperparathyroidism, secondary renal ([facility]    Hypertension    Left carotid bruit    OSA on CPAP    followed by [facility]   Permanent atrial fibrillation ([facility] 2017   cardiologist-  dr gEinar Gip  Solitary right kidney    Type 2 diabetes mellitus ([facility]    followed by pcp   Wears glasses    Wears partial dentures    UPPER AND LOWER    PAST SURGICAL HISTORY: Past Surgical History:  Procedure Laterality Date   BIOPSY  12/19/2017   Procedure: BIOPSY;  Surgeon: HCarol Ada MD;  [facility];  Service: Endoscopy;;   CARDIOVASCULAR STRESS TEST  11/16/2011   low risk nuclear study w/ no ischemia/  normal LV function and wall motion , ef 60%   CARDIOVERSION  12/18/2011   Procedure: CARDIOVERSION;  Surgeon: JLaverda Page MD;  [facility]  Service: Cardiovascular;  Laterality: N/A;   CATARACT EXTRACTION W/ INTRAOCULAR LENS IMPLANT Right 2015   COLON RESECTION N/A 12/13/2017   Procedure: LAPAROSCOPIC END COLOSTOMY HARTMAN'S PROCEDURE;   Surgeon: TLeighton Ruff MD;  Location: [facility];  Service: General;  Laterality: N/A;   COLOSTOMY TAKEDOWN N/A 05/29/2018   Procedure: LAPAROSCOPIC COLOSTOMY REVERSAL ERAS PATHWAY, RIGID PROCTOSCOPY;  Surgeon: TLeighton Ruff MD;  [facility]: [facility];  Service: General;  Laterality: N/A;   CYST REMOVED FROM UPPER BACK  09/2017   BENIGN   ESOPHAGOGASTRODUODENOSCOPY N/A 12/19/2017   Procedure: ESOPHAGOGASTRODUODENOSCOPY (EGD);  Surgeon: Carol Ada, MD;  Location: [facility];  Service: Endoscopy;  Laterality: N/A;   FLEXIBLE SIGMOIDOSCOPY N/A 12/13/2017   Procedure: FLEXIBLE SIGMOIDOSCOPY;  Surgeon: Carol Ada, MD;  [facility];  Service: Endoscopy;  Laterality: N/A;   INGUINAL HERNIA REPAIR Left 1980s   AND REMOVAL CYST ON UPPER BACK   PAROTIDECTOMY Right 09-21-1999   dr Janace Hoard _0    salivery gland mass  (benign warthin's tumor)   RIGHT HEART CATH N/A 08/22/2021   Procedure: RIGHT HEART CATH;  Surgeon: Adrian Prows, MD;  Location: [facility];  Service: Cardiovascular;  Laterality: N/A;   TRANSTHORACIC ECHOCARDIOGRAM  01/04/2018   mild LVH, ef 46-96%, grade 2 diastolic function/  trivial AR and MR/  mild LAE/  mild to moderate TR    FAMILY HISTORY: Family History  Problem Relation Age of Onset   Cancer Mother    Cancer Father    Alzheimer's disease Father    Diabetes Brother    Kidney disease Paternal Aunt    Sleep apnea Neg Hx     SOCIAL HISTORY: Social History   Socioeconomic History   Marital status: Married    Spouse name: Not on file   Number of children: 1   Years of education: Not on file   Highest education level: Not on file  Occupational History   Occupation: Theme park manager    Comment: [employer]  Tobacco Use   Smoking status: Former    Packs/day: 0.50    Years: 30.00    Pack years: 15.00    Types: Cigarettes    Quit date: 2022    Years since quitting: 1.1   Smokeless tobacco: Never   Tobacco comments:    Patient stated the he quit smoking again  right after last OV.  Vaping Use   Vaping Use: Never used  Substance and Sexual Activity   Alcohol use: No    Alcohol/week: 0.0 standard drinks   Drug use: No   Sexual activity: Not on file  Other Topics Concern   Not on file  Social History Narrative   Drinks 2-3 caffeine drinks a day    Social Determinants of Health   Financial Resource Strain: Not on file  Food Insecurity: Not on file  Transportation Needs: Not on file  Physical Activity: Not on file  Stress: Not on file  Social Connections: Not on file  Intimate Partner Violence: Not on file      PHYSICAL EXAM  There were no vitals filed for this visit. There is no height or weight on file to calculate BMI.  Generalized: Well developed, in no acute distress  Chest: Lungs clear to auscultation on the right and expiratory wheezes heard on the left.   Neurological examination  Mentation: Alert oriented to time, place, history taking. Follows all commands speech and language fluent Cranial nerve II-XII: Extraocular movements were full, visual field were full on confrontational test Head turning and shoulder shrug  were normal and symmetric. Motor: The motor testing reveals 5 over 5 strength of all 4 extremities. Good symmetric motor tone is noted throughout.  Sensory: Sensory testing is intact to soft touch on all 4 extremities. No evidence of extinction is noted.  Gait and station: Gait is normal.    DIAGNOSTIC DATA (LABS, IMAGING, TESTING) - I reviewed patient records, labs, notes, testing and imaging myself where available.  Lab Results  Component Value Date   WBC 2.9 (L) 08/17/2021   HGB 16.0 08/22/2021   HGB 16.0 08/22/2021  HCT 47.0 08/22/2021   HCT 47.0 08/22/2021   MCV 95.7 08/17/2021   PLT 150 08/17/2021      Component Value Date/Time   NA 145 08/22/2021 1139   NA 145 08/22/2021 1139   K 3.5 08/22/2021 1139   K 3.6 08/22/2021 1139   CL 104 08/22/2021 0951   CO2 31 08/22/2021 0951   GLUCOSE 93  08/22/2021 0951   BUN 15 08/22/2021 0951   CREATININE 1.29 (H) 08/22/2021 0951   CREATININE 1.27 (H) 05/17/2021 1306   CREATININE 1.23 04/10/2011 1307   CALCIUM 9.1 08/22/2021 0951   PROT 6.8 05/17/2021 1306   ALBUMIN 3.8 05/17/2021 1306   AST 14 (L) 05/17/2021 1306   ALT 12 05/17/2021 1306   ALKPHOS 82 05/17/2021 1306   BILITOT 1.2 05/17/2021 1306   GFRNONAA 58 (L) 08/22/2021 0951   GFRNONAA 59 (L) 05/17/2021 1306   GFRAA >60 06/01/2018 0407   No results found for: CHOL, HDL, LDLCALC, LDLDIRECT, TRIG, CHOLHDL Lab Results  Component Value Date   HGBA1C 6.5 (H) 05/21/2018   Lab Results  Component Value Date   VITAMINB12 409 08/17/2021   No results found for: TSH    ASSESSMENT AND PLAN 76 y.o. year old male  has a past medical history of AAA (abdominal aortic aneurysm), Acute GI bleeding (12/19/2017), Allergic rhinitis, seasonal, Anemia secondary to renal failure, Anticoagulant long-term use, Arthritis, B12 deficiency, BPH (benign prostatic hyperplasia), CKD (chronic kidney disease), stage III ([place]), Colonic stricture ([place]) (12/13/2017), Colostomy present ([place]), Congenital absence of left kidney (followed by [facility]ist [facility]--- dr detarding), Duodenal ulcer (12/20/2017), Dyslipidemia, Esophageal candidiasis ([place]) (12/19/2017), Gastric ulcer without hemorrhage or perforation (12/20/2017), History of bowel resection (12/13/2017), Hypercholesteremia (09/19/2018), Hyperparathyroidism, secondary renal ([place]), Hypertension, Left carotid bruit, OSA on CPAP, Permanent atrial fibrillation ([place]) (2017), Solitary right kidney, Type 2 diabetes mellitus ([place]), Wears glasses, and Wears partial dentures. here with:  OSA on CPAP  - CPAP compliance excellent - Poor treatment of AHI  - Change pressure setting to auto titration 5-15 cmH2O - Encourage patient to use CPAP nightly and > 4 hours each night - F/U in 3 months or sooner if needed   Ward Givens, MSN, NP-C 09/07/2021,  11:01 AM [facility] [address] [place], [place] [address] [phone]

## 2021-09-11 DIAGNOSIS — D709 Neutropenia, unspecified: Secondary | ICD-10-CM | POA: Diagnosis not present

## 2021-09-11 DIAGNOSIS — E559 Vitamin D deficiency, unspecified: Secondary | ICD-10-CM | POA: Diagnosis not present

## 2021-09-11 DIAGNOSIS — E7800 Pure hypercholesterolemia, unspecified: Secondary | ICD-10-CM | POA: Diagnosis not present

## 2021-09-11 DIAGNOSIS — Z79899 Other long term (current) drug therapy: Secondary | ICD-10-CM | POA: Diagnosis not present

## 2021-09-11 DIAGNOSIS — E1122 Type 2 diabetes mellitus with diabetic chronic kidney disease: Secondary | ICD-10-CM | POA: Diagnosis not present

## 2021-09-11 DIAGNOSIS — K769 Liver disease, unspecified: Secondary | ICD-10-CM | POA: Diagnosis not present

## 2021-09-11 DIAGNOSIS — N1831 Chronic kidney disease, stage 3a: Secondary | ICD-10-CM | POA: Diagnosis not present

## 2021-09-13 ENCOUNTER — Other Ambulatory Visit: Payer: Self-pay | Admitting: Student

## 2021-09-13 DIAGNOSIS — I5031 Acute diastolic (congestive) heart failure: Secondary | ICD-10-CM

## 2021-09-18 ENCOUNTER — Telehealth: Payer: Self-pay | Admitting: Pulmonary Disease

## 2021-09-18 DIAGNOSIS — I272 Pulmonary hypertension, unspecified: Secondary | ICD-10-CM | POA: Diagnosis not present

## 2021-09-18 DIAGNOSIS — N1831 Chronic kidney disease, stage 3a: Secondary | ICD-10-CM | POA: Diagnosis not present

## 2021-09-18 DIAGNOSIS — E1122 Type 2 diabetes mellitus with diabetic chronic kidney disease: Secondary | ICD-10-CM | POA: Diagnosis not present

## 2021-09-18 DIAGNOSIS — D72819 Decreased white blood cell count, unspecified: Secondary | ICD-10-CM | POA: Diagnosis not present

## 2021-09-18 DIAGNOSIS — I480 Paroxysmal atrial fibrillation: Secondary | ICD-10-CM | POA: Diagnosis not present

## 2021-09-18 DIAGNOSIS — J9611 Chronic respiratory failure with hypoxia: Secondary | ICD-10-CM | POA: Diagnosis not present

## 2021-09-18 DIAGNOSIS — I10 Essential (primary) hypertension: Secondary | ICD-10-CM | POA: Diagnosis not present

## 2021-09-18 DIAGNOSIS — D508 Other iron deficiency anemias: Secondary | ICD-10-CM | POA: Diagnosis not present

## 2021-09-18 DIAGNOSIS — E7800 Pure hypercholesterolemia, unspecified: Secondary | ICD-10-CM | POA: Diagnosis not present

## 2021-09-18 DIAGNOSIS — G4730 Sleep apnea, unspecified: Secondary | ICD-10-CM | POA: Diagnosis not present

## 2021-09-18 NOTE — Telephone Encounter (Signed)
Fax received from Dr. Gurney Maxin with [facility] to perform a Right inguinal hernia repair on patient.  Patient needs surgery clearance. Patient was seen on 08/21/2021 bu Dr. Silas Flood. Office protocol is a risk assessment can be sent to surgeon if patient has been seen in 60 days or less.   Sending to Dr. Silas Flood for risk assessment or recommendations if patient needs to be seen in office prior to surgical procedure.

## 2021-09-19 NOTE — Telephone Encounter (Signed)
Pulmonary medicine does not provide pulmonary clearance rather preoperative risk evaluation.  Based on the ARISCAT model, patient is intermediate at 13.3% risk of postoperative pulmonary complication.  Major drivers of his risk include hypoxemia as well as age.  His risk is likely underestimated as does not account for his underlying presumed pulmonary hypertension based on right heart catheterization January 2023. --Avoid general anesthesia if possible, use spinal or regional anesthesia as able --If general anesthesia is needed, minimize neuromuscular blockade --May benefit from anesthesia consult given underlying pulmonary hypertension with mildly depressed cardiac index --Provide duo nebs in the preoperative area and postoperatively in the PACU and continue bronchodilators if remains admitted following procedure --If patient is admitted following procedure, recommend progressive or stepdown level of care

## 2021-09-21 ENCOUNTER — Ambulatory Visit: Payer: Medicare HMO | Admitting: Cardiology

## 2021-09-28 ENCOUNTER — Inpatient Hospital Stay (HOSPITAL_COMMUNITY): Payer: Medicare HMO

## 2021-09-28 ENCOUNTER — Other Ambulatory Visit: Payer: Self-pay

## 2021-09-28 ENCOUNTER — Inpatient Hospital Stay (HOSPITAL_COMMUNITY)
Admission: EM | Admit: 2021-09-28 | Discharge: 2021-10-01 | DRG: 208 | Disposition: A | Discharge status: Home / Self Care | Payer: Medicare HMO | Attending: Family Medicine | Admitting: Family Medicine

## 2021-09-28 ENCOUNTER — Encounter (HOSPITAL_COMMUNITY): Payer: Self-pay

## 2021-09-28 ENCOUNTER — Emergency Department (HOSPITAL_COMMUNITY): Payer: Medicare HMO

## 2021-09-28 DIAGNOSIS — G9340 Encephalopathy, unspecified: Secondary | ICD-10-CM | POA: Diagnosis not present

## 2021-09-28 DIAGNOSIS — J9690 Respiratory failure, unspecified, unspecified whether with hypoxia or hypercapnia: Secondary | ICD-10-CM | POA: Diagnosis not present

## 2021-09-28 DIAGNOSIS — Z972 Presence of dental prosthetic device (complete) (partial): Secondary | ICD-10-CM

## 2021-09-28 DIAGNOSIS — R590 Localized enlarged lymph nodes: Secondary | ICD-10-CM | POA: Diagnosis not present

## 2021-09-28 DIAGNOSIS — F17210 Nicotine dependence, cigarettes, uncomplicated: Secondary | ICD-10-CM | POA: Diagnosis present

## 2021-09-28 DIAGNOSIS — I4821 Permanent atrial fibrillation: Secondary | ICD-10-CM | POA: Diagnosis present

## 2021-09-28 DIAGNOSIS — I10 Essential (primary) hypertension: Secondary | ICD-10-CM | POA: Diagnosis present

## 2021-09-28 DIAGNOSIS — G4730 Sleep apnea, unspecified: Secondary | ICD-10-CM | POA: Diagnosis not present

## 2021-09-28 DIAGNOSIS — Z79899 Other long term (current) drug therapy: Secondary | ICD-10-CM

## 2021-09-28 DIAGNOSIS — Z978 Presence of other specified devices: Secondary | ICD-10-CM

## 2021-09-28 DIAGNOSIS — E119 Type 2 diabetes mellitus without complications: Secondary | ICD-10-CM | POA: Diagnosis not present

## 2021-09-28 DIAGNOSIS — M479 Spondylosis, unspecified: Secondary | ICD-10-CM | POA: Diagnosis present

## 2021-09-28 DIAGNOSIS — J9611 Chronic respiratory failure with hypoxia: Secondary | ICD-10-CM | POA: Diagnosis not present

## 2021-09-28 DIAGNOSIS — R911 Solitary pulmonary nodule: Secondary | ICD-10-CM | POA: Diagnosis not present

## 2021-09-28 DIAGNOSIS — J9602 Acute respiratory failure with hypercapnia: Secondary | ICD-10-CM | POA: Diagnosis not present

## 2021-09-28 DIAGNOSIS — J9622 Acute and chronic respiratory failure with hypercapnia: Secondary | ICD-10-CM | POA: Diagnosis present

## 2021-09-28 DIAGNOSIS — G4733 Obstructive sleep apnea (adult) (pediatric): Secondary | ICD-10-CM | POA: Diagnosis present

## 2021-09-28 DIAGNOSIS — E8729 Other acidosis: Secondary | ICD-10-CM | POA: Diagnosis not present

## 2021-09-28 DIAGNOSIS — Z20822 Contact with and (suspected) exposure to covid-19: Secondary | ICD-10-CM | POA: Diagnosis not present

## 2021-09-28 DIAGNOSIS — I2781 Cor pulmonale (chronic): Secondary | ICD-10-CM | POA: Diagnosis present

## 2021-09-28 DIAGNOSIS — R410 Disorientation, unspecified: Secondary | ICD-10-CM | POA: Diagnosis not present

## 2021-09-28 DIAGNOSIS — Z9981 Dependence on supplemental oxygen: Secondary | ICD-10-CM

## 2021-09-28 DIAGNOSIS — I480 Paroxysmal atrial fibrillation: Secondary | ICD-10-CM | POA: Diagnosis not present

## 2021-09-28 DIAGNOSIS — R000 Tachycardia, unspecified: Secondary | ICD-10-CM | POA: Diagnosis not present

## 2021-09-28 DIAGNOSIS — I5032 Chronic diastolic (congestive) heart failure: Secondary | ICD-10-CM | POA: Diagnosis present

## 2021-09-28 DIAGNOSIS — I130 Hypertensive heart and chronic kidney disease with heart failure and stage 1 through stage 4 chronic kidney disease, or unspecified chronic kidney disease: Secondary | ICD-10-CM | POA: Diagnosis present

## 2021-09-28 DIAGNOSIS — N2581 Secondary hyperparathyroidism of renal origin: Secondary | ICD-10-CM | POA: Diagnosis present

## 2021-09-28 DIAGNOSIS — Z7901 Long term (current) use of anticoagulants: Secondary | ICD-10-CM

## 2021-09-28 DIAGNOSIS — E873 Alkalosis: Secondary | ICD-10-CM | POA: Diagnosis not present

## 2021-09-28 DIAGNOSIS — J918 Pleural effusion in other conditions classified elsewhere: Secondary | ICD-10-CM | POA: Diagnosis present

## 2021-09-28 DIAGNOSIS — E1122 Type 2 diabetes mellitus with diabetic chronic kidney disease: Secondary | ICD-10-CM | POA: Diagnosis present

## 2021-09-28 DIAGNOSIS — Z7984 Long term (current) use of oral hypoglycemic drugs: Secondary | ICD-10-CM

## 2021-09-28 DIAGNOSIS — R0600 Dyspnea, unspecified: Secondary | ICD-10-CM

## 2021-09-28 DIAGNOSIS — R197 Diarrhea, unspecified: Secondary | ICD-10-CM | POA: Diagnosis not present

## 2021-09-28 DIAGNOSIS — Z833 Family history of diabetes mellitus: Secondary | ICD-10-CM

## 2021-09-28 DIAGNOSIS — N183 Chronic kidney disease, stage 3 unspecified: Secondary | ICD-10-CM | POA: Diagnosis present

## 2021-09-28 DIAGNOSIS — R918 Other nonspecific abnormal finding of lung field: Secondary | ICD-10-CM | POA: Diagnosis not present

## 2021-09-28 DIAGNOSIS — Z888 Allergy status to other drugs, medicaments and biological substances status: Secondary | ICD-10-CM

## 2021-09-28 DIAGNOSIS — I2723 Pulmonary hypertension due to lung diseases and hypoxia: Secondary | ICD-10-CM | POA: Diagnosis present

## 2021-09-28 DIAGNOSIS — E7800 Pure hypercholesterolemia, unspecified: Secondary | ICD-10-CM | POA: Diagnosis present

## 2021-09-28 DIAGNOSIS — I517 Cardiomegaly: Secondary | ICD-10-CM | POA: Diagnosis not present

## 2021-09-28 DIAGNOSIS — N179 Acute kidney failure, unspecified: Secondary | ICD-10-CM | POA: Diagnosis not present

## 2021-09-28 DIAGNOSIS — R404 Transient alteration of awareness: Secondary | ICD-10-CM | POA: Diagnosis not present

## 2021-09-28 DIAGNOSIS — J439 Emphysema, unspecified: Secondary | ICD-10-CM | POA: Diagnosis present

## 2021-09-28 DIAGNOSIS — J9601 Acute respiratory failure with hypoxia: Secondary | ICD-10-CM | POA: Diagnosis present

## 2021-09-28 DIAGNOSIS — Z91199 Patient's noncompliance with other medical treatment and regimen due to unspecified reason: Secondary | ICD-10-CM

## 2021-09-28 DIAGNOSIS — Z743 Need for continuous supervision: Secondary | ICD-10-CM | POA: Diagnosis not present

## 2021-09-28 DIAGNOSIS — N400 Enlarged prostate without lower urinary tract symptoms: Secondary | ICD-10-CM | POA: Diagnosis present

## 2021-09-28 DIAGNOSIS — R4182 Altered mental status, unspecified: Secondary | ICD-10-CM | POA: Diagnosis not present

## 2021-09-28 DIAGNOSIS — J9811 Atelectasis: Secondary | ICD-10-CM | POA: Diagnosis not present

## 2021-09-28 DIAGNOSIS — R69 Illness, unspecified: Secondary | ICD-10-CM | POA: Diagnosis not present

## 2021-09-28 DIAGNOSIS — R0602 Shortness of breath: Secondary | ICD-10-CM | POA: Diagnosis not present

## 2021-09-28 DIAGNOSIS — J9692 Respiratory failure, unspecified with hypercapnia: Secondary | ICD-10-CM

## 2021-09-28 DIAGNOSIS — G9341 Metabolic encephalopathy: Secondary | ICD-10-CM | POA: Diagnosis not present

## 2021-09-28 DIAGNOSIS — D631 Anemia in chronic kidney disease: Secondary | ICD-10-CM | POA: Diagnosis not present

## 2021-09-28 DIAGNOSIS — J90 Pleural effusion, not elsewhere classified: Secondary | ICD-10-CM | POA: Diagnosis not present

## 2021-09-28 DIAGNOSIS — Z841 Family history of disorders of kidney and ureter: Secondary | ICD-10-CM

## 2021-09-28 DIAGNOSIS — Z882 Allergy status to sulfonamides status: Secondary | ICD-10-CM

## 2021-09-28 DIAGNOSIS — Z885 Allergy status to narcotic agent status: Secondary | ICD-10-CM

## 2021-09-28 DIAGNOSIS — R0902 Hypoxemia: Secondary | ICD-10-CM | POA: Diagnosis not present

## 2021-09-28 DIAGNOSIS — R350 Frequency of micturition: Secondary | ICD-10-CM | POA: Diagnosis not present

## 2021-09-28 DIAGNOSIS — J9621 Acute and chronic respiratory failure with hypoxia: Secondary | ICD-10-CM | POA: Diagnosis not present

## 2021-09-28 DIAGNOSIS — Z8711 Personal history of peptic ulcer disease: Secondary | ICD-10-CM

## 2021-09-28 DIAGNOSIS — I4891 Unspecified atrial fibrillation: Secondary | ICD-10-CM | POA: Diagnosis not present

## 2021-09-28 DIAGNOSIS — I5031 Acute diastolic (congestive) heart failure: Secondary | ICD-10-CM

## 2021-09-28 DIAGNOSIS — F5089 Other specified eating disorder: Secondary | ICD-10-CM | POA: Diagnosis not present

## 2021-09-28 DIAGNOSIS — I272 Pulmonary hypertension, unspecified: Secondary | ICD-10-CM | POA: Diagnosis not present

## 2021-09-28 DIAGNOSIS — J690 Pneumonitis due to inhalation of food and vomit: Secondary | ICD-10-CM

## 2021-09-28 DIAGNOSIS — I2609 Other pulmonary embolism with acute cor pulmonale: Secondary | ICD-10-CM | POA: Diagnosis present

## 2021-09-28 DIAGNOSIS — I714 Abdominal aortic aneurysm, without rupture, unspecified: Secondary | ICD-10-CM | POA: Diagnosis present

## 2021-09-28 DIAGNOSIS — J811 Chronic pulmonary edema: Secondary | ICD-10-CM | POA: Diagnosis not present

## 2021-09-28 DIAGNOSIS — I3139 Other pericardial effusion (noninflammatory): Secondary | ICD-10-CM | POA: Diagnosis not present

## 2021-09-28 DIAGNOSIS — R5383 Other fatigue: Secondary | ICD-10-CM | POA: Diagnosis not present

## 2021-09-28 LAB — BLOOD GAS, ARTERIAL
Acid-Base Excess: 7.3 mmol/L — ABNORMAL HIGH (ref 0.0–2.0)
Acid-Base Excess: 8.2 mmol/L — ABNORMAL HIGH (ref 0.0–2.0)
Acid-Base Excess: 10.3 mmol/L — ABNORMAL HIGH (ref 0.0–2.0)
Bicarbonate: 39 mmol/L — ABNORMAL HIGH (ref 20.0–28.0)
Bicarbonate: 41.4 mmol/L — ABNORMAL HIGH (ref 20.0–28.0)
Bicarbonate: 36.7 mmol/L — ABNORMAL HIGH (ref 20.0–28.0)
Drawn by: 27733
Drawn by: 27733
Drawn by: 38235
FIO2: 36 %
FIO2: 60 %
FIO2: 50 %
O2 Saturation: 93.2 %
O2 Saturation: 95.4 %
O2 Saturation: 94.1 %
Patient temperature: 36.7
Patient temperature: 36.9
Patient temperature: 37
pCO2 arterial: 90 mmHg (ref 32–48)
pCO2 arterial: 106 mmHg (ref 32–48)
pCO2 arterial: 54 mmHg — ABNORMAL HIGH (ref 32–48)
pH, Arterial: 7.24 — ABNORMAL LOW (ref 7.35–7.45)
pH, Arterial: 7.2 — ABNORMAL LOW (ref 7.35–7.45)
pH, Arterial: 7.44 (ref 7.35–7.45)
pO2, Arterial: 71 mmHg — ABNORMAL LOW (ref 83–108)
pO2, Arterial: 84 mmHg (ref 83–108)
pO2, Arterial: 65 mmHg — ABNORMAL LOW (ref 83–108)

## 2021-09-28 LAB — RESP PANEL BY RT-PCR (FLU A&B, COVID) ARPGX2
Influenza A by PCR: NEGATIVE
Influenza B by PCR: NEGATIVE
SARS Coronavirus 2 by RT PCR: NEGATIVE

## 2021-09-28 LAB — CBC WITH DIFFERENTIAL/PLATELET
Abs Immature Granulocytes: 0.01 10*3/uL (ref 0.00–0.07)
Basophils Absolute: 0 10*3/uL (ref 0.0–0.1)
Basophils Relative: 1 %
Eosinophils Absolute: 0 10*3/uL (ref 0.0–0.5)
Eosinophils Relative: 1 %
HCT: 52.5 % — ABNORMAL HIGH (ref 39.0–52.0)
Hemoglobin: 15.8 g/dL (ref 13.0–17.0)
Immature Granulocytes: 0 %
Lymphocytes Relative: 21 %
Lymphs Abs: 0.7 10*3/uL (ref 0.7–4.0)
MCH: 30.8 pg (ref 26.0–34.0)
MCHC: 30.1 g/dL (ref 30.0–36.0)
MCV: 102.3 fL — ABNORMAL HIGH (ref 80.0–100.0)
Monocytes Absolute: 0.3 10*3/uL (ref 0.1–1.0)
Monocytes Relative: 8 %
Neutro Abs: 2.2 10*3/uL (ref 1.7–7.7)
Neutrophils Relative %: 69 %
Platelets: 181 10*3/uL (ref 150–400)
RBC: 5.13 MIL/uL (ref 4.22–5.81)
RDW: 17.9 % — ABNORMAL HIGH (ref 11.5–15.5)
WBC: 3.2 10*3/uL — ABNORMAL LOW (ref 4.0–10.5)
nRBC: 0 % (ref 0.0–0.2)

## 2021-09-28 LAB — I-STAT CHEM 8, ED
BUN: 26 mg/dL — ABNORMAL HIGH (ref 8–23)
Calcium, Ion: 1.2 mmol/L (ref 1.15–1.40)
Chloride: 98 mmol/L (ref 98–111)
Creatinine, Ser: 1.5 mg/dL — ABNORMAL HIGH (ref 0.61–1.24)
Glucose, Bld: 131 mg/dL — ABNORMAL HIGH (ref 70–99)
HCT: 55 % — ABNORMAL HIGH (ref 39.0–52.0)
Hemoglobin: 18.7 g/dL — ABNORMAL HIGH (ref 13.0–17.0)
Potassium: 4.6 mmol/L (ref 3.5–5.1)
Sodium: 143 mmol/L (ref 135–145)
TCO2: 39 mmol/L — ABNORMAL HIGH (ref 22–32)

## 2021-09-28 LAB — URINALYSIS, ROUTINE W REFLEX MICROSCOPIC
Bilirubin Urine: NEGATIVE
Glucose, UA: NEGATIVE mg/dL
Hgb urine dipstick: NEGATIVE
Ketones, ur: NEGATIVE mg/dL
Leukocytes,Ua: NEGATIVE
Nitrite: NEGATIVE
Protein, ur: 100 mg/dL — AB
Specific Gravity, Urine: 1.015 (ref 1.005–1.030)
pH: 5 (ref 5.0–8.0)

## 2021-09-28 LAB — COMPREHENSIVE METABOLIC PANEL
ALT: 16 U/L (ref 0–44)
AST: 17 U/L (ref 15–41)
Albumin: 3.9 g/dL (ref 3.5–5.0)
Alkaline Phosphatase: 90 U/L (ref 38–126)
Anion gap: 7 (ref 5–15)
BUN: 23 mg/dL (ref 8–23)
CO2: 35 mmol/L — ABNORMAL HIGH (ref 22–32)
Calcium: 8.7 mg/dL — ABNORMAL LOW (ref 8.9–10.3)
Chloride: 98 mmol/L (ref 98–111)
Creatinine, Ser: 1.59 mg/dL — ABNORMAL HIGH (ref 0.61–1.24)
GFR, Estimated: 45 mL/min — ABNORMAL LOW (ref >60)
Glucose, Bld: 138 mg/dL — ABNORMAL HIGH (ref 70–99)
Potassium: 4.4 mmol/L (ref 3.5–5.1)
Sodium: 140 mmol/L (ref 135–145)
Total Bilirubin: 1.3 mg/dL — ABNORMAL HIGH (ref 0.3–1.2)
Total Protein: 7.5 g/dL (ref 6.5–8.1)

## 2021-09-28 LAB — BRAIN NATRIURETIC PEPTIDE: B Natriuretic Peptide: 629 pg/mL — ABNORMAL HIGH (ref 0.0–100.0)

## 2021-09-28 LAB — CBG MONITORING, ED: Glucose-Capillary: 150 mg/dL — ABNORMAL HIGH (ref 70–99)

## 2021-09-28 LAB — GLUCOSE, CAPILLARY: Glucose-Capillary: 115 mg/dL — ABNORMAL HIGH (ref 70–99)

## 2021-09-28 MED ORDER — SUCCINYLCHOLINE CHLORIDE 200 MG/10ML IV SOSY
PREFILLED_SYRINGE | INTRAVENOUS | Status: AC
  Administered 2021-09-28: 19:00:00 100 mg via INTRAVENOUS
  Filled 2021-09-28: qty 10

## 2021-09-28 MED ORDER — SUCCINYLCHOLINE CHLORIDE 200 MG/10ML IV SOSY
100.0000 mg | PREFILLED_SYRINGE | Freq: Once | INTRAVENOUS | Status: AC
Start: 2021-09-28 — End: 2021-09-28

## 2021-09-28 MED ORDER — HEPARIN SODIUM (PORCINE) 5000 UNIT/ML IJ SOLN
5000.0000 [IU] | Freq: Three times a day (TID) | INTRAMUSCULAR | Status: DC
  Administered 2021-09-28 – 2021-09-30 (×6): 5000 [IU] via SUBCUTANEOUS
  Filled 2021-09-28 (×6): qty 1

## 2021-09-28 MED ORDER — POLYETHYLENE GLYCOL 3350 17 G PO PACK
17.0000 g | PACK | Freq: Every day | ORAL | Status: DC
  Administered 2021-09-29 – 2021-10-01 (×3): 17 g
  Filled 2021-09-28 (×3): qty 1

## 2021-09-28 MED ORDER — ONDANSETRON HCL 4 MG PO TABS: 4.0000 mg | ORAL_TABLET | Freq: Four times a day (QID) | ORAL | Status: DC | PRN

## 2021-09-28 MED ORDER — FUROSEMIDE 10 MG/ML IJ SOLN
40.0000 mg | Freq: Once | INTRAMUSCULAR | Status: AC
  Administered 2021-09-28: 20:00:00 40 mg via INTRAVENOUS
  Filled 2021-09-28: qty 4

## 2021-09-28 MED ORDER — PANTOPRAZOLE SODIUM 40 MG IV SOLR
40.0000 mg | Freq: Every day | INTRAVENOUS | Status: DC
  Administered 2021-09-29 – 2021-09-30 (×2): 40 mg via INTRAVENOUS
  Filled 2021-09-28 (×2): qty 10

## 2021-09-28 MED ORDER — IPRATROPIUM BROMIDE 0.02 % IN SOLN: 0.5000 mg | Freq: Four times a day (QID) | RESPIRATORY_TRACT | Status: DC

## 2021-09-28 MED ORDER — FENTANYL CITRATE PF 50 MCG/ML IJ SOSY: 25.0000 ug | PREFILLED_SYRINGE | INTRAMUSCULAR | Status: DC | PRN

## 2021-09-28 MED ORDER — LEVALBUTEROL HCL 0.63 MG/3ML IN NEBU: 0.6300 mg | INHALATION_SOLUTION | Freq: Four times a day (QID) | RESPIRATORY_TRACT | Status: DC

## 2021-09-28 MED ORDER — ETOMIDATE 2 MG/ML IV SOLN: 20.0000 mg | Freq: Once | INTRAVENOUS | Status: AC

## 2021-09-28 MED ORDER — ONDANSETRON HCL 4 MG/2ML IJ SOLN
4.0000 mg | Freq: Four times a day (QID) | INTRAMUSCULAR | Status: DC | PRN
Start: 2021-09-28 — End: 2021-10-01

## 2021-09-28 MED ORDER — PIPERACILLIN-TAZOBACTAM 3.375 G IVPB
3.3750 g | Freq: Three times a day (TID) | INTRAVENOUS | Status: DC
  Administered 2021-09-28 – 2021-09-29 (×2): 3.375 g via INTRAVENOUS
  Filled 2021-09-28 (×2): qty 50

## 2021-09-28 MED ORDER — LEVALBUTEROL HCL 0.63 MG/3ML IN NEBU
INHALATION_SOLUTION | RESPIRATORY_TRACT | Status: AC
  Administered 2021-09-28: 20:00:00 0.63 mg
  Filled 2021-09-28: qty 3

## 2021-09-28 MED ORDER — CHLORHEXIDINE GLUCONATE CLOTH 2 % EX PADS
6.0000 | MEDICATED_PAD | Freq: Every day | CUTANEOUS | Status: DC
  Administered 2021-09-28 – 2021-10-01 (×5): 6 via TOPICAL

## 2021-09-28 MED ORDER — IPRATROPIUM BROMIDE 0.02 % IN SOLN
RESPIRATORY_TRACT | Status: AC
Start: 2021-09-28 — End: 2021-09-28
  Administered 2021-09-28: 20:00:00 0.5 mg
  Filled 2021-09-28: qty 2.5

## 2021-09-28 MED ORDER — IPRATROPIUM-ALBUTEROL 0.5-2.5 (3) MG/3ML IN SOLN
3.0000 mL | Freq: Four times a day (QID) | RESPIRATORY_TRACT | Status: DC
  Administered 2021-09-28 – 2021-09-29 (×5): 3 mL via RESPIRATORY_TRACT
  Filled 2021-09-28 (×5): qty 3

## 2021-09-28 MED ORDER — ACETAMINOPHEN 325 MG PO TABS: 650.0000 mg | ORAL_TABLET | Freq: Four times a day (QID) | ORAL | Status: DC | PRN

## 2021-09-28 MED ORDER — ETOMIDATE 2 MG/ML IV SOLN
INTRAVENOUS | Status: AC
  Administered 2021-09-28: 19:00:00 20 mg via INTRAVENOUS
  Filled 2021-09-28: qty 10

## 2021-09-28 MED ORDER — INSULIN ASPART 100 UNIT/ML IJ SOLN
0.0000 [IU] | Freq: Four times a day (QID) | INTRAMUSCULAR | Status: DC
  Administered 2021-09-29: 2 [IU] via SUBCUTANEOUS
  Administered 2021-09-29: 3 [IU] via SUBCUTANEOUS
  Administered 2021-10-01: 1 [IU] via SUBCUTANEOUS
  Administered 2021-10-01: 2 [IU] via SUBCUTANEOUS

## 2021-09-28 MED ORDER — ACETAMINOPHEN 650 MG RE SUPP: 650.0000 mg | Freq: Four times a day (QID) | RECTAL | Status: DC | PRN

## 2021-09-28 MED ORDER — PROPOFOL 1000 MG/100ML IV EMUL
5.0000 ug/kg/min | INTRAVENOUS | Status: DC
  Administered 2021-09-28: 19:00:00 5 ug/kg/min via INTRAVENOUS
  Administered 2021-09-28 – 2021-09-29 (×2): 60 ug/kg/min via INTRAVENOUS
  Administered 2021-09-29: 50 ug/kg/min via INTRAVENOUS
  Administered 2021-09-29: 60 ug/kg/min via INTRAVENOUS
  Filled 2021-09-28 (×5): qty 100

## 2021-09-28 NOTE — ED Triage Notes (Signed)
Pt brought to ED via [facility] for Code Stroke. Per EMS, pt was suppose to go to doctor this am for problems with his breathing, EMS called out for AMS, noted R sided facial droop and drooling, wouldn't grip or follow commands.  ?

## 2021-09-28 NOTE — Progress Notes (Signed)
Pharmacy Antibiotic Note ? ?Manuel [name] is a 76 y.o. male admitted on 09/28/2021 with aspiration pneumonia.  Pharmacy has been consulted for piperacillin/tazobactam dosing. ? ?CT shows worsening moderate-sized right pleural effusion with adjacent collapse or consolidation of the right lower lobe. The right lower lobe bronchus is filled with fluid and debris which was probably aspirated. ? ?ClCr 50 ml/min.  ? ?Plan: ?Zosyn 3.375g IV q8h (4 hour infusion). ?Monitor cultures, clinical status, renal function  ?Narrow abx as able and f/u duration  ? ? ?Height: _0  (190.5 cm) ?Weight: 92.9 kg (204 lb 11.2 oz) ?IBW/kg (Calculated) : 84.5 ? ?Temp (24hrs), Avg:97.6 ?F (36.4 ?C), Min:97 ?F (36.1 ?C), Max:98.4 ?F (36.9 ?C) ? ?Recent Labs  ?Lab 09/28/21 ?1442 09/28/21 ?1444  ?WBC 3.2*  --   ?CREATININE 1.59* 1.50*  ?  ?Estimated Creatinine Clearance: 50.9 mL/min (A) (by C-G formula based on SCr of 1.5 mg/dL (H)).   ? ?Allergies  ?Allergen Reactions  ? Sulfa Antibiotics Shortness Of Breath  ?  Headaches   ? Colchicine   ?  Other reaction(s): diarrhea  ? Labetalol Hcl   ?  Other reaction(s): dysuria  ? Tramadol Hcl   ?  Other reaction(s): nausea  ? ? ?Antimicrobials this admission: ?Piptazo 3/9 >>   ? ?Microbiology results: ?3/9  MRSA PCR: pending ? ?Thank you for allowing pharmacy to be a part of this patient?s care. ? ? ?Benetta Spar, PharmD, BCPS, BCCP ?Clinical Pharmacist ? ?Please check AMION for all [place] phone numbers ?After 10:00 PM, call [place] [phone] ? ?

## 2021-09-28 NOTE — ED Notes (Signed)
Dr. Alvino Chapel unsure of continuing code stroke at this time and wants pt in examination room instead of being taken to CT. Pt's O2 sat 77% upon arrival to ED. Pt placed on NRB with increase of O2 sat to 100%.  ?

## 2021-09-28 NOTE — Progress Notes (Signed)
AP APA02 [facility]) Hospital Liaison note: ? ?Notified via fax from [facility], of request for [place] services. Will continue to follow for disposition. ? ?Please call with any outpatient palliative questions or concerns. ? ?Thank you for the opportunity to participate in this patient's care. ? ?Thank you, ?Lorelee Market, LPN ?[facility] Liaison ?[phone] ?

## 2021-09-28 NOTE — Assessment & Plan Note (Addendum)
Secondary to hypercapnia.  Head CT unremarkable.  EMS reports right facial droop, and drooling and will not grip.  Spouse notes some drooling but states it was on the left.  Patient altered in the ED unable to examine.  Teleneurologist felt this was respiratory in etiology. ?-Consider brain MRI pending clinical course, he is currently intubated. ?

## 2021-09-28 NOTE — Assessment & Plan Note (Addendum)
Creatinine 1.59.  Baseline 0.9-1.2.  Chest x-ray showing moderate right pleural effusion, BNP elevated at 629. ?-IV Lasix 40 mg x 1, further Lasix dosing pending clinical course. ?- Hold lisinopril ?

## 2021-09-28 NOTE — ED Notes (Signed)
Pt intubated with 7.5 ETT by Dr. Rogene Houston. Positive color change on CO2 detector. Bilateral breath sounds heard by RT. Tube secured at 24cm at the lip. ?

## 2021-09-28 NOTE — Assessment & Plan Note (Addendum)
O2 sats down to 75% on room air, was placed on nonrebreather subsequently BiPAP,with mental status change and ABG findings patient was intubated.   ?- Prior to intubation ABG showed pH 7.2, PCO2 initially 90 increasing to 106.  PCO2 initially 71 > 84.  With change in mental status.   ?- Chest x-ray showing moderate right pleural effusion.  BNP elevated at 629.   ?- Last echo 06/2021, EF 60 to 65%, findings of elevated right heart pressures, with severe TR, increased right atrial pressure, dilated IVC, increased RV and RV size. RSVP- 35. ?-Patient has chronic respiratory disease, does not appear an exact diagnosis of his chronic hypoxemia has been determined.  Follows with pulmonologist Dr. Silas Flood, no evidence of ILD on high-res CT. PFT suggestive of moderate restriction versus gas trapping.  Had mild emphysematous changes, on imaging.  Findings of cirrhosis on CT with considerations for portal pulmonary hypertension.  Suspected multifactorial contributions, that would be difficult to treat. ?- Patient indicated he was not thrilled about being on O2 and provider suspected patient might not use it despite education. ?-He is on Eliquis, wife reports compliance as far as she knows, he is not tachycardic, no reports of dyspnea or chest pain.  Low suspicion for PE. ?-Obtain chest CT Wo ?-Please consult PCCM in the morning ?- Continue propofol for sedation ?-Addendum, CT shows worsening pleural effusion, adjacent collapse or consolidation, with findings concerning for aspiration.  ?

## 2021-09-28 NOTE — ED Notes (Signed)
Per Hospitalist and EDP to keep pt in ED x 1 more hour and then re evaluate ?

## 2021-09-28 NOTE — Telephone Encounter (Signed)
OV notes have been faxed to [facility]. Nothing further needed ?

## 2021-09-28 NOTE — ED Provider Notes (Signed)
CRITICAL CARE ?Performed by: Fredia Sorrow ?Total critical care time: 35 minutes ?Critical care time was exclusive of separately billable procedures and treating other patients. ?Critical care was necessary to treat or prevent imminent or life-threatening deterioration. ?Critical care was time spent personally by me on the following activities: development of treatment plan with patient and/or surrogate as well as nursing, discussions with consultants, evaluation of patient's response to treatment, examination of patient, obtaining history from patient or surrogate, ordering and performing treatments and interventions, ordering and review of laboratory studies, ordering and review of radiographic studies, pulse oximetry and re-evaluation of patient's condition. ? ?Patient was turned over with the expectation that maybe BiPAP would improve his mental status.  Patient remained very sedated.  Hospitalist was concerned about his situation which was appropriate.  So we decided to wait for now or repeat his blood gas.  His PCO2 went from 90-106 on arterial blood gas.  However patient was more alert and moving around more.  But obviously with these changes plan was made to intubate him.  Patient intubated with a 7 and half tube no complications.  See note below.  Hospitalist will admit to the ICU here.  Patient also started on propofol drip.  His blood pressures have been elevated so that will help as well. ? ?Procedure Name: Intubation ?Date/Time: 09/28/2021 7:31 PM ?Performed by: Fredia Sorrow, MD ?Pre-anesthesia Checklist: Patient identified, Patient being monitored, Emergency Drugs available, Timeout performed and Suction available ?Oxygen Delivery Method: Ambu bag ?Preoxygenation: Pre-oxygenation with 100% oxygen ?Induction Type: Rapid sequence ?Ventilation: Mask ventilation without difficulty ?Grade View: Grade II ?Tube size: 7.5 mm ?Number of attempts: 1 ?Airway Equipment and Method: Rigid stylet ?Placement  Confirmation: ETT inserted through vocal cords under direct vision, CO2 detector and Breath sounds checked- equal and bilateral ?Secured at: 25 cm ?Tube secured with: ETT holder ?Comments: Intubated without any difficulties.  No injuries.  Patient was on BiPAP prior we kept him on BiPAP while the patient received etomidate and succinylcholine.  Switched him over last minute and then proceeded with intubation. ? ? ?  ? ?  ?Fredia Sorrow, MD ?09/28/21 1934 ? ?

## 2021-09-28 NOTE — ED Notes (Addendum)
Dr. Alvino Chapel at bedside speaking with wife and tele-neuro MD. Code Stroke canceled.  ?

## 2021-09-28 NOTE — H&P (Addendum)
History and Physical    Manuel [name] LMR:[id number] DOB: 1945/11/19 DOA: 09/28/2021  PCP: Manuel Morning, DO   Patient coming from: Home  I have personally briefly reviewed patient's old medical records in [place]  Chief Complaint: Altered mental status  HPI: Manuel [name] is a 76 y.o. male with medical history significant for OSA on CPAP, atrial fibrillation, diabetes mellitus, hypertension. Patient was brought to the ED reports of altered mental status.  EMS was called due to altered mental status, they also noted right facial droop and drooling.  Patient would not grip or follow commands. At the time of my initial evaluation, patient is on BiPAP, patient is almost obtunded, and then subsequently woke up to verbal and tactile stimuli, opened his eyes and verbalized.  I spoke to patient's wife on the phone, patient did not report any difficulty breathing, does no cough noted.  No chest pain.  Lower extremity swelling improved compared to prior.  She reports patient has been compliant with his Eliquis and Lasix as far as she knows.  He is on CPAP but she reports he has been compliant.  He is supposed to be on home O2, which was first prescribed about 1 to 2 months ago, but patient has not been using it.   He went to see his doctor today, his O2 sats was 67%, he declined referral to the ED, he went home, put on his home O2 and about an hour or 2 later, patient was unresponsive.  EMS reports that patient had facial droop on the right side and he was drooling and wont grip.  Spouse tells me that she noticed only a droop on the left side, she did not notice any facial asymmetry.    ED Course: Temperature 98.  Heart rate 70s to 90s.  Respiratory rate 12-24.  Blood pressure systolic 357I to 978E.  O2 sats 75% on room air, placed on 15 L nonrebreather and subsequently placed on BiPAP.  Code stroke was called, head CT was negative for acute abnormality.  Telemetry neurologist Dr. Cheral Marker-  deemed that his presentation was respiratory in etiology.  Code stroke was canceled. ABG showed pH of 7.24, PCO2 of 90, PO2 of 71.  Chest x-ray showed moderate right pleural effusion Patient was given about an hour on BiPAP, on reevaluation he was more awake, but ABG showed worsening respiratory acidosis. Patient was subsequently intubated.  Review of Systems: Unable to assess due to altered mental status  Past Medical History:  Diagnosis Date   AAA (abdominal aortic aneurysm)    per cardiologist note dated 03-14-2018 last duplex , 30-24m  (dr gEinar Gip   Acute GI bleeding 12/19/2017   Allergic rhinitis, seasonal    Anemia secondary to renal failure    Anticoagulant long-term use    ELIQUIS   Arthritis    LOWER BACK   B12 deficiency    BPH (benign prostatic hyperplasia)    CKD (chronic kidney disease), stage III ([place]    Colonic stricture ([place] 12/13/2017   Colostomy present ([place]    Congenital absence of left kidney [facility] dr detarding   per CT 12-11-2017 and 01-02-2018   Duodenal ulcer 12/20/2017   Dyslipidemia    Esophageal candidiasis ([place] 12/19/2017   Gastric ulcer without hemorrhage or perforation 12/20/2017   History of bowel resection 12/13/2017   for sigmoid stricture-- emergency sigmoidectomy   Hypercholesteremia 09/19/2018   Hyperparathyroidism, secondary renal ([place]    Hypertension  Left carotid bruit    OSA on CPAP    followed by [facility]   Permanent atrial fibrillation [facility]) 2017   cardiologist-  dr Einar Gip   Solitary right kidney    Type 2 diabetes mellitus ([place])    followed by pcp   Wears glasses    Wears partial dentures    UPPER AND LOWER    Past Surgical History:  Procedure Laterality Date   BIOPSY  12/19/2017   Procedure: BIOPSY;  Surgeon: Carol Ada, MD;  Location: [facility];  Service: Endoscopy;;   CARDIOVASCULAR STRESS TEST  11/16/2011   low risk nuclear study w/ no ischemia/  normal LV  function and wall motion , ef 60%   CARDIOVERSION  12/18/2011   Procedure: CARDIOVERSION;  Surgeon: Laverda Page, MD;  Location: [facility];  Service: Cardiovascular;  Laterality: N/A;   CATARACT EXTRACTION W/ INTRAOCULAR LENS IMPLANT Right 2015   COLON RESECTION N/A 12/13/2017   Procedure: LAPAROSCOPIC END COLOSTOMY HARTMAN'S PROCEDURE;  Surgeon: Leighton Ruff, MD;  Location: [facility];  Service: General;  Laterality: N/A;   COLOSTOMY TAKEDOWN N/A 05/29/2018   Procedure: LAPAROSCOPIC COLOSTOMY REVERSAL ERAS PATHWAY, RIGID PROCTOSCOPY;  Surgeon: Leighton Ruff, MD;  Location: [facility];  Service: General;  Laterality: N/A;   CYST REMOVED FROM UPPER BACK  09/2017   BENIGN   ESOPHAGOGASTRODUODENOSCOPY N/A 12/19/2017   Procedure: ESOPHAGOGASTRODUODENOSCOPY (EGD);  Surgeon: Carol Ada, MD;  Location: [facility];  Service: Endoscopy;  Laterality: N/A;   FLEXIBLE SIGMOIDOSCOPY N/A 12/13/2017   Procedure: FLEXIBLE SIGMOIDOSCOPY;  Surgeon: Carol Ada, MD;  Location: [facility];  Service: Endoscopy;  Laterality: N/A;   INGUINAL HERNIA REPAIR Left 1980s   AND REMOVAL CYST ON UPPER BACK   PAROTIDECTOMY Right 09-21-1999   dr Janace Hoard _0    salivery gland mass  (benign warthin's tumor)   RIGHT HEART CATH N/A 08/22/2021   Procedure: RIGHT HEART CATH;  Surgeon: Adrian Prows, MD;  Location: [facility];  Service: Cardiovascular;  Laterality: N/A;   TRANSTHORACIC ECHOCARDIOGRAM  01/04/2018   mild LVH, ef 56-38%, grade 2 diastolic function/  trivial AR and MR/  mild LAE/  mild to moderate TR     reports that he quit smoking about 14 months ago. His smoking use included cigarettes. He has a 15.00 pack-year smoking history. He has never used smokeless tobacco. He reports that he does not drink alcohol and does not use drugs.  Allergies  Allergen Reactions   Sulfa Antibiotics Shortness Of Breath    Headaches    Colchicine     Other reaction(s): diarrhea   Labetalol Hcl     Other reaction(s): dysuria    Tramadol Hcl     Other reaction(s): nausea    Family History  Problem Relation Age of Onset   Cancer Mother    Cancer Father    Alzheimer's disease Father    Diabetes Brother    Kidney disease Paternal Aunt    Sleep apnea Neg Hx    Prior to Admission medications   Medication Sig Start Date End Date Taking? Authorizing Provider  acetaminophen (TYLENOL) 500 MG tablet Take 500 mg by mouth every 6 (six) hours as needed for moderate pain.    [provider]  albuterol (VENTOLIN HFA) 108 (90 Base) MCG/ACT inhaler Inhale 2 puffs into the lungs every 6 (six) hours as needed for wheezing or shortness of breath. 06/06/21   Adrian Prows, MD  D3-50 1.25 MG (50000 UT) capsule Take 50,000 Units by  mouth every Wednesday. 05/08/21   [provider]  diltiazem (CARDIZEM) 120 MG tablet Take 120 mg by mouth in the [name] and at bedtime.    [provider]  doxazosin (CARDURA) 4 MG tablet Take 1 tablet (4 mg total) by mouth at bedtime. 08/07/21   Cantwell, Celeste C, PA-C  ELIQUIS 5 MG TABS tablet TAKE 1 TABLET BY MOUTH TWICE A DAY 03/28/21   Adrian Prows, MD  FARXIGA 10 MG TABS tablet Take 10 mg by mouth daily. 04/27/20   [provider]  ferrous sulfate 325 (65 FE) MG tablet Take 325 mg by mouth daily.    [provider]  furosemide (LASIX) 40 MG tablet TAKE 1 TABLET BY MOUTH EVERY DAY IN THE [name] 09/13/21   Cantwell, Celeste C, PA-C  lisinopril (ZESTRIL) 10 MG tablet Take 1 tablet (10 mg total) by mouth at bedtime. 08/07/21   Cantwell, Celeste C, PA-C  metFORMIN (GLUCOPHAGE) 500 MG tablet Take 500 mg by mouth 2 (two) times daily with a meal.  12/02/17   [provider]  potassium chloride (KLOR-CON) 10 MEQ tablet Take 2 tablets (20 mEq total) by mouth daily as needed (Take with furosemide only). 06/06/21 09/05/21  Adrian Prows, MD  pravastatin (PRAVACHOL) 20 MG tablet Take 20 mg by mouth every evening.  01/03/16   [provider]  vitamin B-12  (CYANOCOBALAMIN) 1000 MCG tablet Take 1,000 mcg by mouth 2 (two) times daily.    [provider]    Physical Exam: Vitals:   09/28/21 1437 09/28/21 1500 09/28/21 1530 09/28/21 1600  BP: (!) 152/87 140/82 (!) 142/78 138/90  Pulse: 88 75 74 75  Resp: 18 17 (!) 24 15  SpO2: 100% 96% 94% 92%  Weight:      Height:        Constitutional: On BiPAP. Vitals:   09/28/21 1437 09/28/21 1500 09/28/21 1530 09/28/21 1600  BP: (!) 152/87 140/82 (!) 142/78 138/90  Pulse: 88 75 74 75  Resp: 18 17 (!) 24 15  SpO2: 100% 96% 94% 92%  Weight:      Height:       Eyes: PERRL, lids and conjunctivae normal ENMT: Mucous membranes are moist.   Neck: normal, supple, no masses, no thyromegaly Respiratory: On BiPAP Cardiovascular: Regular rate and rhythm, no murmurs / rubs / gallops. No extremity edema.  Lower extremities warm Abdomen: no tenderness, no masses palpated. No hepatosplenomegaly. Bowel sounds positive.  Musculoskeletal: no clubbing / cyanosis. No joint deformity upper and lower extremities. Good ROM, no contractures. Normal muscle tone.  Skin: no rashes, lesions, ulcers. No induration Neurologic: On BiPAP. Psychiatric: Unable to assess.  Labs on Admission: I have personally reviewed following labs and imaging studies  CBC: Recent Labs  Lab 09/28/21 1442 09/28/21 1444  WBC 3.2*  --   NEUTROABS 2.2  --   HGB 15.8 18.7*  HCT 52.5* 55.0*  MCV 102.3*  --   PLT 181  --    Basic Metabolic Panel: Recent Labs  Lab 09/28/21 1442 09/28/21 1444  NA 140 143  K 4.4 4.6  CL 98 98  CO2 35*  --   GLUCOSE 138* 131*  BUN 23 26*  CREATININE 1.59* 1.50*  CALCIUM 8.7*  --    GFR: Estimated Creatinine Clearance: 50.9 mL/min (A) (by C-G formula based on SCr of 1.5 mg/dL (H)). Liver Function Tests: Recent Labs  Lab 09/28/21 1442  AST 17  ALT 16  ALKPHOS 90  BILITOT 1.3*  PROT 7.5  ALBUMIN 3.9   CBG: Recent Labs  Lab 09/28/21 1435  GLUCAP 150*   Radiological Exams on  Admission: CT Head Wo Contrast  Result Date: 09/28/2021 CLINICAL DATA:  Mental status change, unknown cause EXAM: CT HEAD WITHOUT CONTRAST TECHNIQUE: Contiguous axial images were obtained from the base of the skull through the vertex without intravenous contrast. RADIATION DOSE REDUCTION: This exam was performed according to the departmental dose-optimization program which includes automated exposure control, adjustment of the mA and/or kV according to patient size and/or use of iterative reconstruction technique. COMPARISON:  None. FINDINGS: Brain: There is no acute intracranial hemorrhage, mass effect, or edema. Gray-white differentiation is preserved. There is no extra-axial fluid collection. Ventricles and sulci are within normal limits in size and configuration. Vascular: No hyperdense vessel or unexpected calcification. Skull: Calvarium is unremarkable. Sinuses/Orbits: No acute finding.  Right lens replacement. Other: None. IMPRESSION: No acute intracranial hemorrhage or evidence of acute infarction. Electronically Signed   By: Macy Mis M.D.   On: 09/28/2021 15:45   DG Chest Portable 1 View  Result Date: 09/28/2021 CLINICAL DATA:  Shortness of breath EXAM: PORTABLE CHEST 1 VIEW COMPARISON:  07/27/2021 FINDINGS: Cardiomegaly. Aortic atherosclerosis. Moderate sized right-sided pleural effusion with associated right basilar opacity. Suspect small left pleural effusion. No pneumothorax. IMPRESSION: Moderate sized right pleural effusion with associated right basilar opacity, likely atelectasis. Suspect small left pleural effusion. Electronically Signed   By: Davina Poke D.O.   On: 09/28/2021 14:59    EKG: Independently reviewed.  Atrial fibrillation rate 82.  QTc 470.  No significant change from prior.  Assessment/Plan Principal Problem:   Acute on chronic respiratory failure with hypoxia and hypercapnia (HCC) Active Problems:   OSA (obstructive sleep apnea)   Acute encephalopathy   AKI  (acute kidney injury) ([place])   Atrial fibrillation (HCC)   DM (diabetes mellitus) ([place])   Primary hypertension   Cor pulmonale, chronic (HCC)   Assessment and Plan: * Acute on chronic respiratory failure with hypoxia and hypercapnia (HCC) O2 sats down to 75% on room air, was placed on nonrebreather subsequently BiPAP,with mental status change and ABG findings patient was intubated.   - Prior to intubation ABG showed pH 7.2, PCO2 initially 90 increasing to 106.  PCO2 initially 71 > 84.  With change in mental status.   - Chest x-ray showing moderate right pleural effusion.  BNP elevated at 629.   - Last echo 06/2021, EF 60 to 65%, findings of elevated right heart pressures, with severe TR, increased right atrial pressure, dilated IVC, increased RV and RV size. RSVP- 35. -Patient has chronic respiratory disease, does not appear an exact diagnosis of his chronic hypoxemia has been determined.  Follows with pulmonologist Dr. Silas Flood, no evidence of ILD on high-res CT. PFT suggestive of moderate restriction versus gas trapping.  Had mild emphysematous changes, on imaging.  Findings of cirrhosis on CT with considerations for portal pulmonary hypertension.  Suspected multifactorial contributions, that would be difficult to treat. - Patient indicated he was not thrilled about being on O2 and provider suspected patient might not use it despite education. -He is on Eliquis, wife reports compliance as far as she knows, he is not tachycardic, no reports of dyspnea or chest pain.  Low suspicion for PE. -Obtain chest CT Wo -Please consult PCCM in the [name] - Continue propofol for sedation -Addendum, CT shows worsening pleural effusion, adjacent collapse or consolidation, with findings concerning for aspiration.   AKI (acute kidney injury) (  HCC) Creatinine 1.59.  Baseline 0.9-1.2.  Chest x-ray showing moderate right pleural effusion, BNP elevated at 629. -IV Lasix 40 mg x 1, further Lasix dosing pending  clinical course. - Hold lisinopril  Acute encephalopathy Secondary to hypercapnia.  Head CT unremarkable.  EMS reports right facial droop, and drooling and will not grip.  Spouse notes some drooling but states it was on the left.  Patient altered in the ED unable to examine.  Teleneurologist felt this was respiratory in etiology. -Consider brain MRI pending clinical course, he is currently intubated.  Aspiration pneumonia ([place]) Aspiration pneumonia with respiratory failure.  Not meeting sepsis criteria.  Afebrile no leukocytosis.  CT findings shows moderate-sized right pleural effusion with adjacent collapse of consolidation in the right lower lobe.  Debris's in the lower lobe bronchus, and filled with fluid, probably aspirated. - IV zosyn -IV Lasix 40 mg x 1, may benefit from thoracentesis.  Primary hypertension Stable- NPO - Hold lisinopril, doxazosin, diltiazem - PRN labetalol  DM (diabetes mellitus) (HCC) Hold metformin and faxiga - SSI- S - Check Hgba1c  Atrial fibrillation (HCC) In atrial fibrillation rate controlled at 82.  On anticoagulation with Eliquis.  - Hold eliquis and diltiazem for now while NPO,  - Continue DVT prophylaxis with heparin   DVT prophylaxis: Heparin Code Status: Full code Family Communication: Spoke with spouse on the phone. Disposition Plan:  > 2 days Consults called: Please consult critical care in the [name] Admission status: Inpt tele I certify that at the point of admission it is my clinical judgment that the patient will require inpatient hospital care spanning beyond 2 midnights from the point of admission due to high intensity of service, high risk for further deterioration and high frequency of surveillance required.   CRITICAL CARE Performed by: Bethena Roys  Total critical care time: 70 minutes  Critical care time was exclusive of separately billable procedures and treating other patients.  Critical care was necessary to  treat or prevent imminent or life-threatening deterioration.  Critical care was time spent personally by me on the following activities: development of treatment plan with patient and/or surrogate as well as nursing, discussions with consultants, evaluation of patient's response to treatment, examination of patient, obtaining history from patient or surrogate, ordering and performing treatments and interventions, ordering and review of laboratory studies, ordering and review of radiographic studies, pulse oximetry and re-evaluation of patient's condition.    Bethena Roys MD [facility]  09/28/2021, 8:46 PM

## 2021-09-28 NOTE — Progress Notes (Signed)
Patient transported from ED to CT scan and then to ICU room 10 with no adverse events noted. ?

## 2021-09-28 NOTE — ED Notes (Signed)
Date and time results received: 09/28/21 1839 ?(use smartphrase ".now" to insert current time) ? ?Test: PCO2 ?Critical Value: 106 ? ?Name of Provider Notified: Gloris Manchester, MD ? ?

## 2021-09-28 NOTE — Assessment & Plan Note (Signed)
Hold metformin and faxiga ?- SSI- S ?- Check Hgba1c ?

## 2021-09-28 NOTE — Assessment & Plan Note (Addendum)
In atrial fibrillation rate controlled at 82.  On anticoagulation with Eliquis.  ?- Hold eliquis and diltiazem for now while NPO,  ?- Continue DVT prophylaxis with heparin ?

## 2021-09-28 NOTE — Assessment & Plan Note (Signed)
Aspiration pneumonia with respiratory failure.  Not meeting sepsis criteria.  Afebrile no leukocytosis.  CT findings shows moderate-sized right pleural effusion with adjacent collapse of consolidation in the right lower lobe.  Debris's in the lower lobe bronchus, and filled with fluid, probably aspirated. ?- IV zosyn ?-IV Lasix 40 mg x 1, may benefit from thoracentesis. ?

## 2021-09-28 NOTE — Assessment & Plan Note (Signed)
Stable- NPO ?- Hold lisinopril, doxazosin, diltiazem ?- PRN labetalol ?

## 2021-09-28 NOTE — ED Notes (Signed)
RT, Manuel [name], notified of need to intubate pt. ?

## 2021-09-28 NOTE — ED Provider Notes (Signed)
[facility] EMERGENCY DEPARTMENT Provider Note   CSN: [id number] Arrival date & time: 09/28/21  1428  An emergency department physician performed an initial assessment on this suspected stroke patient at 1435.  History  Chief Complaint  Patient presents with   Altered Mental Status    Manuel [name] is a 76 y.o. male.   Altered Mental Status Patient brought in as a code stroke by EMS.  Patient cannot provide a whole lot of history but reviewing with EMS and patient's wife went to PCP today and found to be hypoxic.  Sats in the 60s.  However patient not willing to go to the hospital that time.  Went home on his oxygen.  Found unresponsive in the chair.  Questionable facial droop and drooling.  But also decreased mental status globally.  Upon arrival patient is confused.  Will move bilateral sides and cross arms but does have what appears to be more of a global encephalopathy.  Discussed with Dr. Cheral Marker from teleneurology.  Feel as if this is more likely due to his hypoxia and respiratory status as opposed to a primary stroke issue although does have a history of A-fib on anticoagulation.  Code stroke canceled.   Past Medical History:  Diagnosis Date   AAA (abdominal aortic aneurysm)    per cardiologist note dated 03-14-2018 last duplex , 30-49m  (dr gEinar Gip   Acute GI bleeding 12/19/2017   Allergic rhinitis, seasonal    Anemia secondary to renal failure    Anticoagulant long-term use    ELIQUIS   Arthritis    LOWER BACK   B12 deficiency    BPH (benign prostatic hyperplasia)    CKD (chronic kidney disease), stage III ([place]    [place]/13[place]   [place] present ([place]    Congenital absence of left kidney [facility] det[facility]   per CT 12-11-2017 and 01-02-2018   Duodenal ulcer 12/20/2017   Dyslipidemia    [place]   Gastric ulcer without hemorrhage or perforation 12/20/2017   History of  bowel resection 12/13/2017   for sigmoid stricture-- emergency sigmoidectomy   Hypercholesteremia 09/19/2018   Hyperparathyroidism, secondary renal ([facility]    Hypertension    Left carotid bruit    OSA on CPAP    followed by [facility]   Permanent atrial fibrillation ([facility] 2017   cardiologist-  dr gEinar Gip  Solitary right kidney    Type 2 diabetes mellitus ([facility]    followed by pcp   Wears glasses    Wears partial dentures    UPPER AND LOWER    Home Medications Prior to Admission medications   Medication Sig Start Date End Date Taking? Authorizing Provider  acetaminophen (TYLENOL) 500 MG tablet Take 500 mg by mouth every 6 (six) hours as needed for moderate pain.    [provider]  albuterol (VENTOLIN HFA) 108 (90 Base) MCG/ACT inhaler Inhale 2 puffs into the lungs every 6 (six) hours as needed for wheezing or shortness of breath. 06/06/21   GAdrian Prows MD  D3-50 1.25 MG (50000 UT) capsule Take 50,000 Units by mouth every Wednesday. 05/08/21   [provider]  diltiazem (CARDIZEM) 120 MG tablet Take 120 mg by mouth in the morning and at bedtime.    [provider]  doxazosin (CARDURA) 4 MG tablet Take 1 tablet (4 mg total) by mouth at bedtime. 08/07/21   Cantwell, Celeste C, PA-C  ELIQUIS 5 MG TABS  tablet TAKE 1 TABLET BY MOUTH TWICE A DAY 03/28/21   Adrian Prows, MD  FARXIGA 10 MG TABS tablet Take 10 mg by mouth daily. 04/27/20   [provider]  ferrous sulfate 325 (65 FE) MG tablet Take 325 mg by mouth daily.    [provider]  furosemide (LASIX) 40 MG tablet TAKE 1 TABLET BY MOUTH EVERY DAY IN THE MORNING 09/13/21   Cantwell, Celeste C, PA-C  lisinopril (ZESTRIL) 10 MG tablet Take 1 tablet (10 mg total) by mouth at bedtime. 08/07/21   Cantwell, Celeste C, PA-C  metFORMIN (GLUCOPHAGE) 500 MG tablet Take 500 mg by mouth 2 (two) times daily with a meal.  12/02/17   [provider]  potassium chloride (KLOR-CON) 10 MEQ tablet Take 2  tablets (20 mEq total) by mouth daily as needed (Take with furosemide only). 06/06/21 09/05/21  Adrian Prows, MD  pravastatin (PRAVACHOL) 20 MG tablet Take 20 mg by mouth every evening.  01/03/16   [provider]  vitamin B-12 (CYANOCOBALAMIN) 1000 MCG tablet Take 1,000 mcg by mouth 2 (two) times daily.    [provider]      Allergies    Sulfa antibiotics, Colchicine, Labetalol hcl, and Tramadol hcl    Review of Systems   Review of Systems  Constitutional:  Negative for appetite change.   Physical Exam Updated Vital Signs BP (!) 142/78    Pulse 74    Resp (!) 24    Ht _0  (1.905 m)    Wt 92.9 kg    SpO2 94%    BMI 25.59 kg/m  Physical Exam Vitals and nursing note reviewed.  Eyes:     Pupils: Pupils are equal, round, and reactive to light.  Cardiovascular:     Rate and Rhythm: Normal rate. Rhythm irregular.  Pulmonary:     Comments: Mildly harsh breath sounds. Abdominal:     Tenderness: There is no abdominal tenderness.  Musculoskeletal:     Cervical back: Neck supple.  Skin:    General: Skin is warm.  Neurological:     Comments: Moving extremities.  Moves bilateral upper extremity spontaneously.  Face appears symmetric.  Eye movements grossly intact.  Some global confusion.    ED Results / Procedures / Treatments   Labs (all labs ordered are listed, but only abnormal results are displayed) Labs Reviewed  BRAIN NATRIURETIC PEPTIDE - Abnormal; Notable for the following components:      Result Value   B Natriuretic Peptide 629.0 (*)    All other components within normal limits  COMPREHENSIVE METABOLIC PANEL - Abnormal; Notable for the following components:   CO2 35 (*)    Glucose, Bld 138 (*)    Creatinine, Ser 1.59 (*)    Calcium 8.7 (*)    Total Bilirubin 1.3 (*)    GFR, Estimated 45 (*)    All other components within normal limits  CBC WITH DIFFERENTIAL/PLATELET - Abnormal; Notable for the following components:   WBC 3.2 (*)    HCT 52.5 (*)     MCV 102.3 (*)    RDW 17.9 (*)    All other components within normal limits  BLOOD GAS, ARTERIAL - Abnormal; Notable for the following components:   pH, Arterial 7.24 (*)    pCO2 arterial 90 (*)    pO2, Arterial 71 (*)    Bicarbonate 39.0 (*)    Acid-Base Excess 7.3 (*)    All other components within normal limits  CBG MONITORING, ED -  Abnormal; Notable for the following components:   Glucose-Capillary 150 (*)    All other components within normal limits  I-STAT CHEM 8, ED - Abnormal; Notable for the following components:   BUN 26 (*)    Creatinine, Ser 1.50 (*)    Glucose, Bld 131 (*)    TCO2 39 (*)    Hemoglobin 18.7 (*)    HCT 55.0 (*)    All other components within normal limits  RESP PANEL BY RT-PCR (FLU A&B, COVID) [id number]    EKG EKG Interpretation  Date/Time:  Thursday September 28 2021 15:00:03 EST Ventricular Rate:  82 PR Interval:    QRS Duration: 100 QT Interval:  402 QTC Calculation: 470 R Axis:   157 Text Interpretation: Atrial fibrillation Ventricular premature complex Anterolateral infarct, old Confirmed by Davonna Belling [phone]) on 09/28/2021 3:03:09 PM  Radiology CT Head Wo Contrast  Result Date: 09/28/2021 CLINICAL DATA:  Mental status change, unknown cause EXAM: CT HEAD WITHOUT CONTRAST TECHNIQUE: Contiguous axial images were obtained from the base of the skull through the vertex without intravenous contrast. RADIATION DOSE REDUCTION: This exam was performed according to the departmental dose-optimization program which includes automated exposure control, adjustment of the mA and/or kV according to patient size and/or use of iterative reconstruction technique. COMPARISON:  None. FINDINGS: Brain: There is no acute intracranial hemorrhage, mass effect, or edema. Gray-white differentiation is preserved. There is no extra-axial fluid collection. Ventricles and sulci are within normal limits in size and configuration. Vascular: No hyperdense vessel or unexpected  calcification. Skull: Calvarium is unremarkable. Sinuses/Orbits: No acute finding.  Right lens replacement. Other: None. IMPRESSION: No acute intracranial hemorrhage or evidence of acute infarction. Electronically Signed   By: Macy Mis M.D.   On: 09/28/2021 15:45   DG Chest Portable 1 View  Result Date: 09/28/2021 CLINICAL DATA:  Shortness of breath EXAM: PORTABLE CHEST 1 VIEW COMPARISON:  07/27/2021 FINDINGS: Cardiomegaly. Aortic atherosclerosis. Moderate sized right-sided pleural effusion with associated right basilar opacity. Suspect small left pleural effusion. No pneumothorax. IMPRESSION: Moderate sized right pleural effusion with associated right basilar opacity, likely atelectasis. Suspect small left pleural effusion. Electronically Signed   By: Davina Poke D.O.   On: 09/28/2021 14:59    Procedures Procedures    Medications Ordered in ED Medications - No data to display  ED Course/ Medical Decision Making/ A&P                           Medical Decision Making Amount and/or Complexity of Data Reviewed Labs: ordered. Radiology: ordered. ECG/medicine tests: independent interpretation performed. Decision-making details documented in ED Course.  Risk Decision regarding hospitalization.  Critical Care Total time providing critical care: 30 minutes Reviewed previous pulmonary notes. Patient brought in for possible code stroke.  Mental status change.  Found upon arrival to have a sat of 75%.  Appeared to be more of a global encephalopathy as opposed to a focal neurodeficit.  Had discussed with Dr. Cheral Marker from neurology.  Head CT done and reassuring.  Independently interpreted by me.  Chest x-ray showed pleural effusion.  ABG done and showed likely CO2 retention.  Likely some other cause of mental status change.  Patient mental status is already improved on some oxygen.  We will start some BiPAP to help with his ventilation.  However with mental status change I feel as if he  would benefit from mission to the hospital.  Will discuss with hospitalist.  Final Clinical Impression(s) / ED Diagnoses Final diagnoses:  Respiratory failure with hypercapnia, unspecified chronicity [facility])    Rx / DC Orders ED Discharge Orders     None         Davonna Belling, MD 09/28/21 1558

## 2021-09-29 ENCOUNTER — Inpatient Hospital Stay (HOSPITAL_COMMUNITY): Payer: Medicare HMO

## 2021-09-29 LAB — GLUCOSE, CAPILLARY
Glucose-Capillary: 102 mg/dL — ABNORMAL HIGH (ref 70–99)
Glucose-Capillary: 156 mg/dL — ABNORMAL HIGH (ref 70–99)
Glucose-Capillary: 185 mg/dL — ABNORMAL HIGH (ref 70–99)
Glucose-Capillary: 217 mg/dL — ABNORMAL HIGH (ref 70–99)
Glucose-Capillary: 231 mg/dL — ABNORMAL HIGH (ref 70–99)
Glucose-Capillary: 72 mg/dL (ref 70–99)
Glucose-Capillary: 84 mg/dL (ref 70–99)
Glucose-Capillary: 85 mg/dL (ref 70–99)

## 2021-09-29 LAB — BASIC METABOLIC PANEL
Anion gap: 12 (ref 5–15)
BUN: 24 mg/dL — ABNORMAL HIGH (ref 8–23)
CO2: 31 mmol/L (ref 22–32)
Calcium: 9.1 mg/dL (ref 8.9–10.3)
Chloride: 98 mmol/L (ref 98–111)
Creatinine, Ser: 1.48 mg/dL — ABNORMAL HIGH (ref 0.61–1.24)
GFR, Estimated: 49 mL/min — ABNORMAL LOW (ref >60)
Glucose, Bld: 81 mg/dL (ref 70–99)
Potassium: 3.1 mmol/L — ABNORMAL LOW (ref 3.5–5.1)
Sodium: 141 mmol/L (ref 135–145)

## 2021-09-29 LAB — PROCALCITONIN
Procalcitonin: <0.1 ng/mL
Procalcitonin: <0.1 ng/mL

## 2021-09-29 LAB — CBC
HCT: 48.3 % (ref 39.0–52.0)
Hemoglobin: 14.9 g/dL (ref 13.0–17.0)
MCH: 29.5 pg (ref 26.0–34.0)
MCHC: 30.8 g/dL (ref 30.0–36.0)
MCV: 95.6 fL (ref 80.0–100.0)
Platelets: 167 10*3/uL (ref 150–400)
RBC: 5.05 MIL/uL (ref 4.22–5.81)
RDW: 17.2 % — ABNORMAL HIGH (ref 11.5–15.5)
WBC: 3.6 10*3/uL — ABNORMAL LOW (ref 4.0–10.5)
nRBC: 0 % (ref 0.0–0.2)

## 2021-09-29 LAB — BLOOD GAS, ARTERIAL
Acid-Base Excess: 17.2 mmol/L — ABNORMAL HIGH (ref 0.0–2.0)
Bicarbonate: 40.1 mmol/L — ABNORMAL HIGH (ref 20.0–28.0)
Drawn by: 41977
FIO2: 40 %
O2 Saturation: 97 %
Patient temperature: 36.9
pCO2 arterial: 39 mmHg (ref 32–48)
pH, Arterial: 7.62 (ref 7.35–7.45)
pO2, Arterial: 84 mmHg (ref 83–108)

## 2021-09-29 LAB — HEMOGLOBIN A1C
Hgb A1c MFr Bld: 6.6 % — ABNORMAL HIGH (ref 4.8–5.6)
Mean Plasma Glucose: 142.72 mg/dL

## 2021-09-29 LAB — MRSA NEXT GEN BY PCR, NASAL: MRSA by PCR Next Gen: NOT DETECTED

## 2021-09-29 MED ORDER — CEFTRIAXONE SODIUM 1 G IJ SOLR
1.0000 g | INTRAMUSCULAR | Status: DC
Start: 2021-09-29 — End: 2021-10-01
  Administered 2021-09-29 – 2021-09-30 (×2): 1 g via INTRAVENOUS
  Filled 2021-09-29 (×2): qty 10

## 2021-09-29 MED ORDER — ORAL CARE MOUTH RINSE
15.0000 mL | OROMUCOSAL | Status: DC
  Administered 2021-09-29 (×5): 15 mL via OROMUCOSAL

## 2021-09-29 MED ORDER — POTASSIUM CHLORIDE 10 MEQ/100ML IV SOLN
10.0000 meq | INTRAVENOUS | Status: AC
  Administered 2021-09-29 (×4): 10 meq via INTRAVENOUS
  Filled 2021-09-29 (×2): qty 100

## 2021-09-29 MED ORDER — CHLORHEXIDINE GLUCONATE 0.12% ORAL RINSE (MEDLINE KIT)
15.0000 mL | Freq: Two times a day (BID) | OROMUCOSAL | Status: DC
  Administered 2021-09-29 – 2021-10-01 (×3): 15 mL via OROMUCOSAL

## 2021-09-29 MED ORDER — POTASSIUM CHLORIDE 10 MEQ/100ML IV SOLN
10.0000 meq | INTRAVENOUS | Status: AC
  Administered 2021-09-29 (×2): 10 meq via INTRAVENOUS
  Filled 2021-09-29 (×2): qty 100

## 2021-09-29 MED ORDER — SODIUM CHLORIDE 0.9 % IV SOLN
500.0000 mg | INTRAVENOUS | Status: DC
  Administered 2021-09-29 – 2021-09-30 (×2): 500 mg via INTRAVENOUS
  Filled 2021-09-29 (×2): qty 5

## 2021-09-29 MED ORDER — METHYLPREDNISOLONE SODIUM SUCC 40 MG IJ SOLR
40.0000 mg | Freq: Two times a day (BID) | INTRAMUSCULAR | Status: DC
  Administered 2021-09-29 – 2021-10-01 (×4): 40 mg via INTRAVENOUS
  Filled 2021-09-29 (×4): qty 1

## 2021-09-29 MED ORDER — ORAL CARE MOUTH RINSE
15.0000 mL | Freq: Two times a day (BID) | OROMUCOSAL | Status: DC
  Administered 2021-09-29 – 2021-10-01 (×4): 15 mL via OROMUCOSAL

## 2021-09-29 MED ORDER — IPRATROPIUM-ALBUTEROL 0.5-2.5 (3) MG/3ML IN SOLN
3.0000 mL | Freq: Four times a day (QID) | RESPIRATORY_TRACT | Status: DC
  Administered 2021-09-30 (×2): 3 mL via RESPIRATORY_TRACT
  Filled 2021-09-29 (×2): qty 3

## 2021-09-29 NOTE — Plan of Care (Signed)
?  Problem: Education: ?Goal: Knowledge of General Education information will improve ?Description: Including pain rating scale, medication(s)/side effects and non-pharmacologic comfort measures ?Outcome: Progressing ?  ?Problem: Health Behavior/Discharge Planning: ?Goal: Ability to manage health-related needs will improve ?Outcome: Progressing ?  ?Problem: Clinical Measurements: ?Goal: Ability to maintain clinical measurements within normal limits will improve ?Outcome: Progressing ?Goal: Will remain free from infection ?Outcome: Progressing ?Goal: Diagnostic test results will improve ?Outcome: Progressing ?Goal: Respiratory complications will improve ?Outcome: Progressing ?Goal: Cardiovascular complication will be avoided ?Outcome: Progressing ?  ?Problem: Activity: ?Goal: Risk for activity intolerance will decrease ?Outcome: Progressing ?  ?Problem: Nutrition: ?Goal: Adequate nutrition will be maintained ?Outcome: Progressing ?  ?Problem: Coping: ?Goal: Level of anxiety will decrease ?Outcome: Progressing ?  ?Problem: Elimination: ?Goal: Will not experience complications related to bowel motility ?Outcome: Progressing ?Goal: Will not experience complications related to urinary retention ?Outcome: Progressing ?  ?Problem: Pain Managment: ?Goal: General experience of comfort will improve ?Outcome: Progressing ?  ?Problem: Safety: ?Goal: Ability to remain free from injury will improve ?Outcome: Progressing ?  ?Problem: Skin Integrity: ?Goal: Risk for impaired skin integrity will decrease ?Outcome: Progressing ?  ?

## 2021-09-29 NOTE — Progress Notes (Signed)
RT attempted to wean patient again but patient remains apenic. RT will attempt to wean again at a later time. RN aware. ?

## 2021-09-29 NOTE — Progress Notes (Signed)
?  Transition of Care (TOC) Screening Note ? ? ?Patient Details  ?Name: Manuel [name] ?Date of Birth: 11-28-1945 ? ? ?Transition of Care (TOC) CM/SW Contact:    ?Iona Beard, LCSWA ?Phone Number: ?09/29/2021, 9:55 AM ? ? ? ?Transition of Care Department [facility]) has reviewed patient and no TOC needs have been identified at this time. We will continue to monitor patient advancement through interdisciplinary progression rounds. If new patient transition needs arise, please place a TOC consult. ? ? ?

## 2021-09-29 NOTE — Progress Notes (Signed)
PROGRESS NOTE     Manuel [name], is a 76 y.o. male, DOB - 1946-05-23, RKY:[id number]  Admit date - 09/28/2021   Admitting Physician Bethena Roys, MD  Outpatient Primary MD for the patient is Manuel Morning, DO  LOS - 1  Chief Complaint  Patient presents with   Altered Mental Status        Brief Narrative:    76 y.o. male with medical history significant for OSA on CPAP, atrial fibrillation, diabetes mellitus, hypertension admitted on 09/28/2021 with acute on chronic hypoxic and hypercapnic respiratory failure, failed BiPAP and was intubated due to unresponsiveness and persistent respiratory acidosis    -Assessment and Plan: 1) Acute on chronic respiratory failure with hypoxia and hypercapnia (HCC) -History of COPD/emphysema -History of OSA on CPAP on home O2 -Apparently noncompliant with oxygen, somewhat more compliant with CPAP PTA -Failed BiPAP in the ED with worsening respiratory acidosis and unresponsiveness while on BiPAP -Remains intubated and sedated -Repeat ABG while intubated shows significant improvement in respiratory acidosis -Patient will need sedation vacation prior to trying to doing a weaning trial -Prior PFTs suggested restrictive lung disease -High-resolution CT previously failed to confirm ILD -Has evidence of COPD/emphysema --- Pulmonology consult requested -Suspect COPD exacerbation so give Solu-Medrol, bronchodilators, azithromycin and Rocephin -Stop IV Zosyn  2) right-sided pneumonia with possible parapneumonic effusion on the right-- Admission CT chest on 09/28/2021 showed interval worsening moderate-sized right pleural effusion with adjacent collapse or consolidation of the right lower lobe. The right lower lobe bronchus is filled with fluid and debris which was probably aspirated. -Management as above #1 -Repeat chest x-ray in a.m, may need Thoracentensis  3)HFpEF/Mild Pulm HTN-- -Echo from December 2022 with EF of 60 to 65%, severe  tricuspid regurgitation, mild pulmonary hypertension -Suspect patient has chronic diastolic dysfunction CHF - Chest x-ray on admission showing moderate right pleural effusion.  BNP elevated at 629.   -Hold off on diuretics at this time -Repeat chest x-ray and BNP in a.m.  4) chronic atrial fibrillation--- PTA patient was on Cardizem for rate control and Eliquis for stroke prophylaxis --May resume Eliquis and Cardizem once extubated  -5) acute metabolic encephalopathy----due to severe hypercapnia -Please see #1 above -Consider neuroimaging if mentation does not improve with sedation vacation and extubation  6)DM2-A1c 6.6 reflecting excellent diabetic control PTA -Hold metformin and faxiga Use Novolog/Humalog Sliding scale insulin with Accu-Cheks/Fingersticks as ordered   7)AKI----acute kidney injury  -Creatinine currently around 1.5 - renally adjust medications, avoid nephrotoxic agents / dehydration  / hypotension  8)HTN--- continue to hold Cardizem doxazosin and lisinopril - 9)Social/Ethics--remains a full code, confirmed with his wife-  CRITICAL CARE Performed by: Roxan Hockey   Total critical care time: 47 minutes  Critical care time was exclusive of separately billable procedures and treating other patients. -Remains intubated and sedated on propofol -Hoping to do sedation vacation and weaning trial later today Critical care was necessary to treat or prevent imminent or life-threatening deterioration.  Critical care was time spent personally by me on the following activities: development of treatment plan with patient and/or surrogate as well as nursing, discussions with consultants, evaluation of patient's response to treatment, examination of patient, obtaining history from patient or surrogate, ordering and performing treatments and interventions, ordering and review of laboratory studies, ordering and review of radiographic studies, pulse oximetry and re-evaluation of  patient's condition.   Disposition/Need for in-Hospital Stay- patient unable to be discharged at this time due to remains intubated and sedated- --requiring ventilatory support  and iv antibiotics   Status is: Inpatient   Disposition: The patient is from: Home              Anticipated d/c is to: Home              Anticipated d/c date is: > 3 days              Patient currently is not medically stable to d/c. Barriers: Not Clinically Stable-   Code Status :  -  Code Status: Full Code   Family Communication:   updated wife and son at [phone]  DVT Prophylaxis  :   - SCDs  heparin injection 5,000 Units Start: 09/28/21 2230   Lab Results  Component Value Date   PLT 167 09/29/2021    Inpatient Medications  Scheduled Meds:  chlorhexidine gluconate (MEDLINE KIT)  15 mL Mouth Rinse BID   Chlorhexidine Gluconate Cloth  6 each Topical Daily   heparin  5,000 Units Subcutaneous Q8H   insulin aspart  0-9 Units Subcutaneous Q6H   ipratropium-albuterol  3 mL Nebulization Q6H   mouth rinse  15 mL Mouth Rinse 10 times per day   pantoprazole (PROTONIX) IV  40 mg Intravenous Daily   polyethylene glycol  17 g Per Tube Daily   Continuous Infusions:  piperacillin-tazobactam (ZOSYN)  IV Stopped (09/29/21 0914)   potassium chloride 100 mL/hr at 09/29/21 1014   propofol (DIPRIVAN) infusion 20 mcg/kg/min (09/29/21 1014)   PRN Meds:.acetaminophen **OR** acetaminophen, fentaNYL (SUBLIMAZE) injection, fentaNYL (SUBLIMAZE) injection, ondansetron **OR** ondansetron (ZOFRAN) IV   Anti-infectives (From admission, onward)    Start     Dose/Rate Route Frequency Ordered Stop   09/28/21 2245  piperacillin-tazobactam (ZOSYN) IVPB 3.375 g        3.375 g 12.5 mL/hr over 240 Minutes Intravenous Every 8 hours 09/28/21 2154           Subjective: [name] today has no fevers, no emesis,     -Remains intubated and sedated -Hoping to redo sedation vacation and try of weaning later  today   Objective: Vitals:   09/29/21 0930 09/29/21 0937 09/29/21 1000 09/29/21 1030  BP: (!) 139/94  (!) 129/95 (!) 156/92  Pulse: (!) 109 (!) 117 (!) 110 (!) 101  Resp: _0 (!) 23  Temp: 98.6 F (37 C) 98.6 F (37 C) 98.8 F (37.1 C) 98.8 F (37.1 C)  TempSrc:      SpO2: 100% 100% 100% 100%  Weight:      Height:        Intake/Output Summary (Last 24 hours) at 09/29/2021 1036 Last data filed at 09/29/2021 1014 Gross per 24 hour  Intake 904.08 ml  Output 3575 ml  Net -2670.92 ml   Filed Weights   09/28/21 1436 09/28/21 2120 09/29/21 0400  Weight: 92.9 kg 88.3 kg 87.4 kg    Physical Exam  Gen:-Intubated and sedated  HEENT:- .AT, ET/OG Tubes Lungs-diminished, right more than left, scattered rhonchi  CV- S1, S2 normal, irregularly irregular Abd-  +ve B.Sounds, Abd Soft, ND Extremity/Skin:- No  edema, pedal pulses present  Neuro-Psych-intubated and sedated  GU-Foley catheter in situ  Data Reviewed: I have personally reviewed following labs and imaging studies  CBC: Recent Labs  Lab 09/28/21 1442 09/28/21 1444 09/29/21 0418  WBC 3.2*  --  3.6*  NEUTROABS 2.2  --   --   HGB 15.8 18.7* 14.9  HCT 52.5* 55.0* 48.3  MCV 102.3*  --  95.6  PLT  181  --  161   Basic Metabolic Panel: Recent Labs  Lab 09/28/21 1442 09/28/21 1444 09/29/21 0418  NA 140 143 141  K 4.4 4.6 3.1*  CL 98 98 98  CO2 35*  --  31  GLUCOSE 138* 131* 81  BUN 23 26* 24*  CREATININE 1.59* 1.50* 1.48*  CALCIUM 8.7*  --  9.1   GFR: Estimated Creatinine Clearance: 51.5 mL/min (A) (by C-G formula based on SCr of 1.48 mg/dL (H)). Liver Function Tests: Recent Labs  Lab 09/28/21 1442  AST 17  ALT 16  ALKPHOS 90  BILITOT 1.3*  PROT 7.5  ALBUMIN 3.9   Cardiac Enzymes: No results for input(s): CKTOTAL, CKMB, CKMBINDEX, TROPONINI in the last 168 hours. BNP (last 3 results) No results for input(s): PROBNP in the last 8760 hours. HbA1C: Recent Labs    09/29/21 0418  HGBA1C 6.6*    Sepsis Labs: _0 (procalcitonin:4,lacticidven:4) ) Recent Results (from the past 240 hour(s))  Resp Panel by RT-PCR (Flu A&B, Covid) Nasopharyngeal Swab     Status: None   Collection Time: 09/28/21  3:27 PM   Specimen: Nasopharyngeal Swab; Nasopharyngeal(NP) swabs in vial transport medium  Result Value Ref [name] Status   SARS Coronavirus 2 by RT PCR NEGATIVE NEGATIVE Final    Comment: (NOTE) SARS-CoV-2 target nucleic acids are NOT DETECTED.  The SARS-CoV-2 RNA is generally detectable in upper respiratory specimens during the acute phase of infection. The lowest concentration of SARS-CoV-2 viral copies this assay can detect is 138 copies/mL. A negative result does not preclude SARS-Cov-2 infection and should not be used as the sole basis for treatment or other patient management decisions. A negative result may occur with  improper specimen collection/handling, submission of specimen other than nasopharyngeal swab, presence of viral mutation(s) within the areas targeted by this assay, and inadequate number of viral copies(<138 copies/mL). A negative result must be combined with clinical observations, patient history, and epidemiological information. The expected result is Negative.  Fact Sheet for Patients:  EntrepreneurPulse.com.au  Fact Sheet for Healthcare Providers:  IncredibleEmployment.be  This test is no t yet approved or cleared by the [place] FDA and  has been authorized for detection and/or diagnosis of SARS-CoV-2 by FDA under an Emergency Use Authorization (EUA). This EUA will remain  in effect (meaning this test can be used) for the duration of the COVID-19 declaration under Section 564(b)(1) of the Act, 21 U.S.C.section 360bbb-3(b)(1), unless the authorization is terminated  or revoked sooner.       Influenza A by PCR NEGATIVE NEGATIVE Final   Influenza B by PCR NEGATIVE NEGATIVE Final    Comment: (NOTE) The  Xpert Xpress SARS-CoV-2/FLU/RSV plus assay is intended as an aid in the diagnosis of influenza from Nasopharyngeal swab specimens and should not be used as a sole basis for treatment. Nasal washings and aspirates are unacceptable for Xpert Xpress SARS-CoV-2/FLU/RSV testing.  Fact Sheet for Patients: EntrepreneurPulse.com.au  Fact Sheet for Healthcare Providers: IncredibleEmployment.be  This test is not yet approved or cleared by the [place] FDA and has been authorized for detection and/or diagnosis of SARS-CoV-2 by FDA under an Emergency Use Authorization (EUA). This EUA will remain in effect (meaning this test can be used) for the duration of the COVID-19 declaration under Section 564(b)(1) of the Act, 21 U.S.C. section 360bbb-3(b)(1), unless the authorization is terminated or revoked.  Performed at [facility], [address], [place], [place] [address]   MRSA Next Gen by PCR, Nasal  Status: None   Collection Time: 09/29/21  4:30 AM   Specimen: Nasal Mucosa; Nasal Swab  Result Value Ref [name] Status   MRSA by PCR Next Gen NOT DETECTED NOT DETECTED Final    Comment: (NOTE) The GeneXpert MRSA Assay (FDA approved for NASAL specimens only), is one component of a comprehensive MRSA colonization surveillance program. It is not intended to diagnose MRSA infection nor to guide or monitor treatment for MRSA infections. Test performance is not FDA approved in patients less than 56 years old. Performed at [facility], [address], [place], [place] [address]       Radiology Studies: CT Head Wo Contrast  Result Date: 09/28/2021 CLINICAL DATA:  Mental status change, unknown cause EXAM: CT HEAD WITHOUT CONTRAST TECHNIQUE: Contiguous axial images were obtained from the base of the skull through the vertex without intravenous contrast. RADIATION DOSE REDUCTION: This exam was performed according to the departmental dose-optimization program which  includes automated exposure control, adjustment of the mA and/or kV according to patient size and/or use of iterative reconstruction technique. COMPARISON:  None. FINDINGS: Brain: There is no acute intracranial hemorrhage, mass effect, or edema. Gray-white differentiation is preserved. There is no extra-axial fluid collection. Ventricles and sulci are within normal limits in size and configuration. Vascular: No hyperdense vessel or unexpected calcification. Skull: Calvarium is unremarkable. Sinuses/Orbits: No acute finding.  Right lens replacement. Other: None. IMPRESSION: No acute intracranial hemorrhage or evidence of acute infarction. Electronically Signed   By: Macy Mis M.D.   On: 09/28/2021 15:45   CT CHEST WO CONTRAST  Result Date: 09/28/2021 CLINICAL DATA:  Hypoxia and ventilator dependent respiratory failure. EXAM: CT CHEST WITHOUT CONTRAST TECHNIQUE: Multidetector CT imaging of the chest was performed following the standard protocol without IV contrast. RADIATION DOSE REDUCTION: This exam was performed according to the departmental dose-optimization program which includes automated exposure control, adjustment of the mA and/or kV according to patient size and/or use of iterative reconstruction technique. COMPARISON:  Chest CT without contrast 07/27/2021, CT abdomen pelvis with contrast 01/02/2018, and 2 portable chest films from today, PA Lat chest from 06/09/2021. FINDINGS: Support tubes: ETT tip is 6.5 cm from the carina, at the level of the heads of the clavicles. NGT is in place. The proximal side-hole is at the esophageal hiatus and should be advanced further in 8-10 cm for optimal placement. Cardiovascular: There's severe cardiomegaly. Small pericardial effusion is again noted. There are distended central pulmonary veins, slightly prominent pulmonary trunk 3.3 cm indicating arterial hypertension but unchanged. The venous distention is also similar at least to 07/27/2021. Minimal scattered  aortic atherosclerosis. There is aortic tortuosity without aneurysm. Normal great vessel branching. Mediastinum/Nodes: No thyroid or axillary mass. Mildly enlarged mediastinal lymph nodes are redemonstrated up to 1.2 cm in short axis with few similar mildly prominent bilateral hilar nodes. No tracheal mass is seen. There is mild fluid in the upper thoracic esophagus without wall thickening. Lungs/Pleura: Moderate-sized layering right pleural effusion is increased, minimal layering left pleural effusion developed. There is compressive collapse/consolidation of the entire right lower lobe, with the right lower lobe main bronchus filled with fluid and debris possibly aspirated material. Centrilobular and paraseptal emphysematous changes in the right-greater-than-left upper lobes are again shown scattered linear scar-like opacities. 6 mm chronic right middle lobe nodule series 4 axial 116, stable and presumed benign. There previously was a 4 mm pleural-based left lower lobe nodule which is no longer seen. Bullous disease is again demonstrated in the base of the  right middle lobe with additional linear scar-like opacity. There is mild dependent atelectasis in the left lung. Left-sided central airways are clear.  There is no pneumothorax. Upper Abdomen: Cirrhotic liver again noted, increased small-volume upper abdominal ascites. No splenomegaly. Left adrenal hyperplasia. Musculoskeletal: There is degenerative change of lower cervical and lower thoracic spine, slight thoracic dextroscoliosis. No concerning regional bone lesion. IMPRESSION: 1. ETT tip 6.5 cm over the carina; NGT proximal side port at the hiatus and should be advanced further in 8-10 cm. 2. Severe cardiomegaly, small pericardial effusion, with prominent pulmonary trunk and central pulmonary veins. No findings of significant subpleural edema, and no body wall anasarca. Small volume upper abdominal ascites could be congestive etiology or related to cirrhosis.  3. Interval worsening moderate-sized right pleural effusion with adjacent collapse or consolidation of the right lower lobe. The right lower lobe bronchus is filled with fluid and debris which was probably aspirated. Dependent debris was noted in the right main bronchus on 07/27/2021. 4. COPD, minimal left pleural effusion, and scarring changes. 5. Mediastinal adenopathy is unchanged from 2 months ago but remains indeterminate. PET-CT or three-month follow-up CT recommended. 6. Aortic atherosclerosis. Electronically Signed   By: Telford Nab M.D.   On: 09/28/2021 21:31   Portable Chest xray  Result Date: 09/29/2021 CLINICAL DATA:  Hypoxia and ventilator dependent respiratory failure. EXAM: PORTABLE CHEST 1 VIEW COMPARISON:  Portable chest yesterday at 7:45 p.m. FINDINGS: 5:56 a.m., 09/29/2021. There are multiple overlying monitor wires. ETT tip is 9 cm from the carina, NGT enters the stomach. The heart is enlarged. There is improvement in perihilar vascular congestion today's exam with only slight central vascular fullness now seen. Again noted, there is small left, small-to-moderate right pleural effusion and overlying hazy consolidation or atelectasis in the right base. Remainder of the lungs are clear. No new or worsening abnormality. Stable mediastinum. Intact thoracic cage. IMPRESSION: 1. Improved central vascular prominence now minimal. No overt edema. 2. No interval change in pleural effusions and right basilar hazy consolidation or atelectasis. Electronically Signed   By: Telford Nab M.D.   On: 09/29/2021 06:14   DG Chest Port 1 View  Result Date: 09/28/2021 CLINICAL DATA:  Intubation. EXAM: PORTABLE CHEST 1 VIEW COMPARISON:  Diarrhea today FINDINGS: Endotracheal tube tip is at the level of the clavicular heads. Enteric tube tip below the diaphragm, the side port is in the region of the gastroesophageal junction. Chronic cardiomegaly moderate-sized right pleural effusion is again seen. Small  suspected left pleural effusion. There is ill-defined right perihilar opacity. No pneumothorax. No convincing pulmonary edema. IMPRESSION: 1. Endotracheal tube tip at the level of the clavicular heads. 2. Enteric tube tip below the diaphragm, the side port in the region of the gastroesophageal junction. Advancement of at least 4-5 cm would place the side-port in the stomach. 3. Cardiomegaly with bilateral pleural effusions, right greater than left. Electronically Signed   By: Keith Rake M.D.   On: 09/28/2021 19:59   DG Chest Portable 1 View  Result Date: 09/28/2021 CLINICAL DATA:  Shortness of breath EXAM: PORTABLE CHEST 1 VIEW COMPARISON:  07/27/2021 FINDINGS: Cardiomegaly. Aortic atherosclerosis. Moderate sized right-sided pleural effusion with associated right basilar opacity. Suspect small left pleural effusion. No pneumothorax. IMPRESSION: Moderate sized right pleural effusion with associated right basilar opacity, likely atelectasis. Suspect small left pleural effusion. Electronically Signed   By: Davina Poke D.O.   On: 09/28/2021 14:59    Scheduled Meds:  chlorhexidine gluconate (MEDLINE KIT)  15 mL Mouth  Rinse BID   Chlorhexidine Gluconate Cloth  6 each Topical Daily   heparin  5,000 Units Subcutaneous Q8H   insulin aspart  0-9 Units Subcutaneous Q6H   ipratropium-albuterol  3 mL Nebulization Q6H   mouth rinse  15 mL Mouth Rinse 10 times per day   pantoprazole (PROTONIX) IV  40 mg Intravenous Daily   polyethylene glycol  17 g Per Tube Daily   Continuous Infusions:  piperacillin-tazobactam (ZOSYN)  IV Stopped (09/29/21 0914)   potassium chloride 100 mL/hr at 09/29/21 1014   propofol (DIPRIVAN) infusion 20 mcg/kg/min (09/29/21 1014)     LOS: 1 day   Roxan Hockey M.D on 09/29/2021 at 10:36 AM  Go to [email or web address] - for contact info  [facility] - Office  [phone]  If 7PM-7AM, please contact night-coverage [email or web address] Password [facility] 09/29/2021, 10:36 AM

## 2021-09-29 NOTE — Consult Note (Signed)
NAME:  Manuel [name], MRN:  [id number], DOB:  1946-02-04, LOS: 1 ADMISSION DATE:  09/28/2021, CONSULTATION DATE:  09/29/2021  REFERRING MD:  Nadine Counts, CHIEF COMPLAINT:  Ventilator management   History of Present Illness:  76 year old smoker with severe COPD on home oxygen, patient of Dr. Silas Flood, admitted via ED for acute hypercarbic respiratory failure, failed BiPAP and required mechanical ventilation. He had a doctor's appointment 3/9 where his oxygen saturation was noted to be 67%, he refused hospital admission and went home put on his oxygen and then was found unresponsive, initially found to have right facial droop, head CT negative , seen by teleneurology  Initial ABG was 7.24/90/71, chest x-ray showed moderate right pleural effusion , follow-up ABG on BiPAP was 7.2 0/106/84 Postintubation ABG was 7.4 4/54/65  Pertinent  Medical History  COPD on home oxygen -PFTs show moderately decreased DLCO Pulmonary hypertension - echo shows elevated RVSP , RHC 07/2021 PVR 4.2 [place], [place] 48/15, RA 9, PCWP 13, CI 1.96 c/w WHO 3 -Atrial fibrillation -CT chest did not show ILD suggested possible cirrhosis -Diabetes type 2 Hypertension OSA on CPAP  Significant Hospital Events: Including procedures, antibiotic start and stop dates in addition to other pertinent events   Head CT negative CT chest without contrast 3/10 right lower lobe consolidation/atelectasis with bronchus filled with debris?  Aspiration, moderate right pleural effusion, severe cardiomegaly with small volume ascites, stable mediastinal lymphadenopathy indeterminate  Interim History / Subjective:  Reviewed last pulmonary evaluation 08/21/2021  Objective   Blood pressure (!) 156/92, pulse (!) 101, temperature 98.8 F (37.1 C), resp. rate (!) 23, height 6' 3" (1.905 m), weight 87.4 kg, SpO2 100 %.    Vent Mode: PRVC FiO2 (%):  [40 %-50 %] 40 % Set Rate:  [18 bmp-21 bmp] 18 bmp Vt Set:  [660 mL] 660 mL PEEP:  [5 cmH20] 5  cmH20 Plateau Pressure:  [18 cmH20-21 cmH20] 21 cmH20   Intake/Output Summary (Last 24 hours) at 09/29/2021 1039 Last data filed at 09/29/2021 1014 Gross per 24 hour  Intake 904.08 ml  Output 3575 ml  Net -2670.92 ml   Filed Weights   09/28/21 1436 09/28/21 2120 09/29/21 0400  Weight: 92.9 kg 88.3 kg 87.4 kg    Examination: General: Elderly man sitting up in bed, no distress on 4 L nasal cannula HENT: No JVD, mild pallor, no icterus Lungs: Decreased breath sounds bilateral, faint expiratory rhonchi, no accessory muscle use Cardiovascular: S1-S2 regular Abdomen: Soft, nontender Extremities: No edema, no deformity Neuro: Alert oriented x3, no asterixis   Chest x-ray 3/10 independently reviewed shows right lower lobe hazy consolidation with small effusions  Resolved Hospital Problem list     Assessment & Plan:  Acute on chronic hypoxic/hypercarbic respiratory failure Underlying severe COPD on home oxygen Mild pulmonary hypertension, WHO 3 with acute cor pulmonale Bilateral pleural effusions  -He tolerated spontaneous breathing trial and was extubated to nasal cannula -Compliance with oxygen was emphasized -ABG shows metabolic alkalosis likely related to overventilation expect this to normalize -Continue diuresis as renal function tolerates   Acute encephalopathy related to above -Resolved  Aspiration pneumonia -Ceftriaxone azithromycin for 5 days for right lower lobe consolidation -Pulmonary toilet with incentive spirometry  Atrial fibrillation  AKI -follow creatinine  Discussed with family at the bedside Please reassess oxygen needs on discharge , saturation of 88% acceptable Follow-up appointment can be made with Dr. Silas Flood at [facility] office  Best Practice (right click and "Reselect all SmartList Selections" daily)   Diet/type:  NPO DVT prophylaxis: DOAC GI prophylaxis: PPI Lines: N/A Foley:  N/A Code Status:  full code Last date of multidisciplinary  goals of care discussion [NA]  Labs   CBC: Recent Labs  Lab 09/28/21 1442 09/28/21 1444 09/29/21 0418  WBC 3.2*  --  3.6*  NEUTROABS 2.2  --   --   HGB 15.8 18.7* 14.9  HCT 52.5* 55.0* 48.3  MCV 102.3*  --  95.6  PLT 181  --  426    Basic Metabolic Panel: Recent Labs  Lab 09/28/21 1442 09/28/21 1444 09/29/21 0418  NA 140 143 141  K 4.4 4.6 3.1*  CL 98 98 98  CO2 35*  --  31  GLUCOSE 138* 131* 81  BUN 23 26* 24*  CREATININE 1.59* 1.50* 1.48*  CALCIUM 8.7*  --  9.1   GFR: Estimated Creatinine Clearance: 51.5 mL/min (A) (by C-G formula based on SCr of 1.48 mg/dL (H)). Recent Labs  Lab 09/28/21 1442 09/28/21 2154 09/29/21 0418  PROCALCITON  --  <0.10 <0.10  WBC 3.2*  --  3.6*    Liver Function Tests: Recent Labs  Lab 09/28/21 1442  AST 17  ALT 16  ALKPHOS 90  BILITOT 1.3*  PROT 7.5  ALBUMIN 3.9   No results for input(s): LIPASE, AMYLASE in the last 168 hours. No results for input(s): AMMONIA in the last 168 hours.  ABG    Component Value Date/Time   PHART 7.62 (HH) 09/29/2021 0805   PCO2ART 39 09/29/2021 0805   PO2ART 84 09/29/2021 0805   HCO3 40.1 (H) 09/29/2021 0805   TCO2 39 (H) 09/28/2021 1444   O2SAT 97 09/29/2021 0805     Coagulation Profile: No results for input(s): INR, PROTIME in the last 168 hours.  Cardiac Enzymes: No results for input(s): CKTOTAL, CKMB, CKMBINDEX, TROPONINI in the last 168 hours.  HbA1C: Hgb A1c MFr Bld  Date/Time Value Ref Range Status  09/29/2021 04:18 AM 6.6 (H) 4.8 - 5.6 % Final    Comment:    (NOTE) Pre diabetes:          5.7%-6.4%  Diabetes:              >6.4%  Glycemic control for   <7.0% adults with diabetes   05/21/2018 10:47 AM 6.5 (H) 4.8 - 5.6 % Final    Comment:    (NOTE) Pre diabetes:          5.7%-6.4% Diabetes:              >6.4% Glycemic control for   <7.0% adults with diabetes     CBG: Recent Labs  Lab 09/28/21 1435 09/28/21 2133 09/29/21 0103 09/29/21 0431 09/29/21 0730   GLUCAP 150* 115* 84 72 85    Review of Systems:   Unable to obtain since intubated and sedated  Past Medical History:  He,  has a past medical history of AAA (abdominal aortic aneurysm), Acute GI bleeding (12/19/2017), Allergic rhinitis, seasonal, Anemia secondary to renal failure, Anticoagulant long-term use, Arthritis, B12 deficiency, BPH (benign prostatic hyperplasia), CKD (chronic kidney disease), stage III ([place]), Colonic stricture ([place]) (12/13/2017), Colostomy present ([place]), Congenital absence of left kidney (followed by nephrologist at [facility]--- dr detarding), Duodenal ulcer (12/20/2017), Dyslipidemia, Esophageal candidiasis ([place]) (12/19/2017), Gastric ulcer without hemorrhage or perforation (12/20/2017), History of bowel resection (12/13/2017), Hypercholesteremia (09/19/2018), Hyperparathyroidism, secondary renal ([place]), Hypertension, Left carotid bruit, OSA on CPAP, Permanent atrial fibrillation ([place]) (2017), Solitary right kidney, Type 2 diabetes mellitus ([place]), Wears glasses,  and Wears partial dentures.   Surgical History:   Past Surgical History:  Procedure Laterality Date   BIOPSY  12/19/2017   Procedure: BIOPSY;  Surgeon: Carol Ada, MD;  Location: [facility];  Service: Endoscopy;;   CARDIOVASCULAR STRESS TEST  11/16/2011   low risk nuclear study w/ no ischemia/  normal LV function and wall motion , ef 60%   CARDIOVERSION  12/18/2011   Procedure: CARDIOVERSION;  Surgeon: Laverda Page, MD;  Location: [facility];  Service: Cardiovascular;  Laterality: N/A;   CATARACT EXTRACTION W/ INTRAOCULAR LENS IMPLANT Right 2015   COLON RESECTION N/A 12/13/2017   Procedure: LAPAROSCOPIC END COLOSTOMY HARTMAN'S PROCEDURE;  Surgeon: Leighton Ruff, MD;  Location: [facility];  Service: General;  Laterality: N/A;   COLOSTOMY TAKEDOWN N/A 05/29/2018   Procedure: LAPAROSCOPIC COLOSTOMY REVERSAL ERAS PATHWAY, RIGID PROCTOSCOPY;  Surgeon: Leighton Ruff, MD;  Location: [facility];  Service: General;   Laterality: N/A;   CYST REMOVED FROM UPPER BACK  09/2017   BENIGN   ESOPHAGOGASTRODUODENOSCOPY N/A 12/19/2017   Procedure: ESOPHAGOGASTRODUODENOSCOPY (EGD);  Surgeon: Carol Ada, MD;  Location: [facility];  Service: Endoscopy;  Laterality: N/A;   FLEXIBLE SIGMOIDOSCOPY N/A 12/13/2017   Procedure: FLEXIBLE SIGMOIDOSCOPY;  Surgeon: Carol Ada, MD;  Location: [facility];  Service: Endoscopy;  Laterality: N/A;   INGUINAL HERNIA REPAIR Left 1980s   AND REMOVAL CYST ON UPPER BACK   PAROTIDECTOMY Right 09-21-1999   dr Janace Hoard _0    salivery gland mass  (benign warthin's tumor)   RIGHT HEART CATH N/A 08/22/2021   Procedure: RIGHT HEART CATH;  Surgeon: Adrian Prows, MD;  Location: [facility];  Service: Cardiovascular;  Laterality: N/A;   TRANSTHORACIC ECHOCARDIOGRAM  01/04/2018   mild LVH, ef 50-53%, grade 2 diastolic function/  trivial AR and MR/  mild LAE/  mild to moderate TR     Social History:   reports that he quit smoking about 14 months ago. His smoking use included cigarettes. He has a 15.00 pack-year smoking history. He has never used smokeless tobacco. He reports that he does not drink alcohol and does not use drugs.   Family History:  His family history includes Alzheimer's disease in his father; Cancer in his father and mother; Diabetes in his brother; Kidney disease in his paternal aunt. There is no history of Sleep apnea.   Allergies Allergies  Allergen Reactions   Sulfa Antibiotics Shortness Of Breath    Headaches    Colchicine     Other reaction(s): diarrhea   Labetalol Hcl     Other reaction(s): dysuria   Tramadol Hcl     Other reaction(s): nausea     Home Medications  Prior to Admission medications   Medication Sig Start Date End Date Taking? Authorizing Provider  acetaminophen (TYLENOL) 500 MG tablet Take 500 mg by mouth every 6 (six) hours as needed for moderate pain.   Yes [provider]  D3-50 1.25 MG (50000 UT) capsule Take 50,000 Units  by mouth every Wednesday. 05/08/21  Yes [provider]  diltiazem (CARDIZEM) 120 MG tablet Take 120 mg by mouth in the morning and at bedtime.   Yes [provider]  doxazosin (CARDURA) 8 MG tablet Take 8 mg by mouth at bedtime. 09/16/21  Yes [provider]  ELIQUIS 5 MG TABS tablet TAKE 1 TABLET BY MOUTH TWICE A DAY 03/28/21  Yes Adrian Prows, MD  FARXIGA 10 MG TABS tablet Take 10 mg by mouth daily. 04/27/20  Yes [provider]  ferrous sulfate 325 (65 FE) MG tablet Take 325 mg by mouth daily.   Yes [provider]  furosemide (LASIX) 40 MG tablet TAKE 1 TABLET BY MOUTH EVERY DAY IN THE MORNING 09/13/21  Yes Cantwell, Celeste C, PA-C  lisinopril (ZESTRIL) 10 MG tablet Take 1 tablet (10 mg total) by mouth at bedtime. 08/07/21  Yes Cantwell, Celeste C, PA-C  metFORMIN (GLUCOPHAGE) 500 MG tablet Take 500 mg by mouth 2 (two) times daily with a meal.  12/02/17  Yes [provider]  potassium chloride (KLOR-CON) 10 MEQ tablet Take 2 tablets (20 mEq total) by mouth daily as needed (Take with furosemide only). 06/06/21 09/28/21 Yes Adrian Prows, MD  pravastatin (PRAVACHOL) 20 MG tablet Take 20 mg by mouth every evening.  01/03/16  Yes [provider]  vitamin B-12 (CYANOCOBALAMIN) 1000 MCG tablet Take 1,000 mcg by mouth 2 (two) times daily.   Yes [provider]  albuterol (VENTOLIN HFA) 108 (90 Base) MCG/ACT inhaler Inhale 2 puffs into the lungs every 6 (six) hours as needed for wheezing or shortness of breath. Patient not taking: Reported on 09/28/2021 06/06/21   Adrian Prows, MD  doxazosin (CARDURA) 4 MG tablet Take 1 tablet (4 mg total) by mouth at bedtime. Patient not taking: Reported on 09/28/2021 08/07/21   Alethia Berthold, PA-C        Kara Mead MD. FCCP. [facility] Pager : [phone]  If no response to pager , please call [phone] until 7 pm After 7:00 pm call Elink  [phone]   09/29/2021

## 2021-09-29 NOTE — Procedures (Signed)
Extubation Procedure Note ? ?Patient Details:   ?Name: Manuel [name] ?DOB: 24-Sep-1945 ?MRN: [id number] ?  ?Airway Documentation:  ?  ?Vent end date: (not recorded) Vent end time: (not recorded)  ? ?Evaluation ? O2 sats: stable throughout ?Complications: No apparent complications ?Patient did tolerate procedure well. ?Bilateral Breath Sounds: Diminished ?  ?Yes ? ?Patient extubated without any complications and placed on 4L [place]. Patient has an adequate cough and is able to speak clearly. All vitals stable and within normal limits. ? ?Blanchie Serve ?09/29/2021, 11:57 AM ? ?

## 2021-09-29 NOTE — Progress Notes (Signed)
Pt extubated successfully by RT, placed on 4L nasal cannula. Pt alert and oriented. Attending RN gave patient ice chips to try, tolerating well. Will ask provider about advancing diet as patient tolerates.  ?

## 2021-09-30 ENCOUNTER — Inpatient Hospital Stay (HOSPITAL_COMMUNITY): Payer: Medicare HMO

## 2021-09-30 LAB — CBC
HCT: 49.6 % (ref 39.0–52.0)
Hemoglobin: 15 g/dL (ref 13.0–17.0)
MCH: 30 pg (ref 26.0–34.0)
MCHC: 30.2 g/dL (ref 30.0–36.0)
MCV: 99.2 fL (ref 80.0–100.0)
Platelets: 157 10*3/uL (ref 150–400)
RBC: 5 MIL/uL (ref 4.22–5.81)
RDW: 18.3 % — ABNORMAL HIGH (ref 11.5–15.5)
WBC: 4.2 10*3/uL (ref 4.0–10.5)
nRBC: 0 % (ref 0.0–0.2)

## 2021-09-30 LAB — RENAL FUNCTION PANEL
Albumin: 3.5 g/dL (ref 3.5–5.0)
Anion gap: 10 (ref 5–15)
BUN: 21 mg/dL (ref 8–23)
CO2: 35 mmol/L — ABNORMAL HIGH (ref 22–32)
Calcium: 8.6 mg/dL — ABNORMAL LOW (ref 8.9–10.3)
Chloride: 98 mmol/L (ref 98–111)
Creatinine, Ser: 1.46 mg/dL — ABNORMAL HIGH (ref 0.61–1.24)
GFR, Estimated: 50 mL/min — ABNORMAL LOW (ref >60)
Glucose, Bld: 125 mg/dL — ABNORMAL HIGH (ref 70–99)
Phosphorus: 3.4 mg/dL (ref 2.5–4.6)
Potassium: 4.3 mmol/L (ref 3.5–5.1)
Sodium: 143 mmol/L (ref 135–145)

## 2021-09-30 LAB — GLUCOSE, CAPILLARY
Glucose-Capillary: 100 mg/dL — ABNORMAL HIGH (ref 70–99)
Glucose-Capillary: 114 mg/dL — ABNORMAL HIGH (ref 70–99)
Glucose-Capillary: 116 mg/dL — ABNORMAL HIGH (ref 70–99)
Glucose-Capillary: 140 mg/dL — ABNORMAL HIGH (ref 70–99)

## 2021-09-30 MED ORDER — DOXAZOSIN MESYLATE 2 MG PO TABS
4.0000 mg | ORAL_TABLET | Freq: Every day | ORAL | Status: DC
  Administered 2021-09-30: 4 mg via ORAL
  Filled 2021-09-30: qty 2

## 2021-09-30 MED ORDER — ALBUTEROL SULFATE (2.5 MG/3ML) 0.083% IN NEBU
2.5000 mg | INHALATION_SOLUTION | RESPIRATORY_TRACT | Status: DC | PRN
Start: 2021-09-30 — End: 2021-10-01

## 2021-09-30 MED ORDER — IPRATROPIUM-ALBUTEROL 0.5-2.5 (3) MG/3ML IN SOLN
3.0000 mL | Freq: Four times a day (QID) | RESPIRATORY_TRACT | Status: DC
Start: 2021-09-30 — End: 2021-10-01
  Administered 2021-09-30 – 2021-10-01 (×3): 3 mL via RESPIRATORY_TRACT
  Filled 2021-09-30 (×3): qty 3

## 2021-09-30 MED ORDER — DILTIAZEM HCL ER COATED BEADS 120 MG PO CP24
240.0000 mg | ORAL_CAPSULE | Freq: Every day | ORAL | Status: DC
  Administered 2021-09-30: 240 mg via ORAL
  Filled 2021-09-30 (×2): qty 2

## 2021-09-30 MED ORDER — APIXABAN 5 MG PO TABS
5.0000 mg | ORAL_TABLET | Freq: Two times a day (BID) | ORAL | Status: DC
  Administered 2021-09-30 – 2021-10-01 (×2): 5 mg via ORAL
  Filled 2021-09-30 (×2): qty 1

## 2021-09-30 MED ORDER — PANTOPRAZOLE SODIUM 40 MG PO TBEC
40.0000 mg | DELAYED_RELEASE_TABLET | Freq: Every day | ORAL | Status: DC
  Administered 2021-10-01: 40 mg via ORAL
  Filled 2021-09-30: qty 1

## 2021-09-30 NOTE — Progress Notes (Signed)
PROGRESS NOTE     Manuel [name], is a 76 y.o. male, DOB - 28-Oct-1945, MVH:[id number]  Admit date - 09/28/2021   Admitting Physician Bethena Roys, MD  Outpatient Primary MD for the patient is [name], DO  LOS - 2  Chief Complaint  Patient presents with   Altered Mental Status        Brief Narrative:    76 y.o. male with medical history significant for OSA on CPAP, atrial fibrillation, diabetes mellitus, hypertension admitted on 09/28/2021 with acute on chronic hypoxic and hypercapnic respiratory failure, failed BiPAP and was intubated due to unresponsiveness and persistent respiratory acidosis -Extubated successfully on 09/29/2021    -Assessment and Plan: 1) Acute on chronic respiratory failure with hypoxia and hypercapnia (HCC) -History of COPD/emphysema -History of OSA on CPAP on home O2 -Apparently noncompliant with oxygen, somewhat more compliant with CPAP PTA -Failed BiPAP in the ED with worsening respiratory acidosis and unresponsiveness while on BiPAP Intubated on 09/28/2021 -Repeat ABG while intubated shows significant improvement in respiratory acidosis -Prior PFTs suggested restrictive lung disease -High-resolution CT previously failed to confirm ILD -Has evidence of COPD/emphysema --- Pulmonology consult appreciated -Suspect COPD exacerbation -continue Solu-Medrol, bronchodilators, azithromycin and Rocephin -Stopped IV Zosyn --Extubated successfully on 09/29/2021 -BiPAP nightly advised  2) right-sided pneumonia with possible parapneumonic effusion on the right-- Admission CT chest on 09/28/2021 showed interval worsening moderate-sized right pleural effusion with adjacent collapse or consolidation of the right lower lobe. The right lower lobe bronchus is filled with fluid and debris which was probably aspirated. -Management as above #1 -Repeat chest x-ray on 09/30/2021 showed bilateral pneumonia with right greater than left effusion -clinically improving  and may not need Thoracentensis  3)HFpEF/Mild Pulm HTN-- -Echo from December 2022 with EF of 60 to 65%, severe tricuspid regurgitation, mild pulmonary hypertension -Suspect patient has chronic diastolic dysfunction CHF - Chest x-ray on admission showing moderate right pleural effusion.  BNP elevated at 629.   -Hold off on diuretics at this time  4) chronic atrial fibrillation--- PTA patient was on Cardizem for rate control and Eliquis for stroke prophylaxis -Resume Cardizem and Eliquis  -5) acute metabolic encephalopathy----due to severe hypercapnia -Please see #1 above -Mentation is back to baseline after extubation  6)DM2-A1c 6.6 reflecting excellent diabetic control PTA -Hold metformin and faxiga Use Novolog/Humalog Sliding scale insulin with Accu-Cheks/Fingersticks as ordered   7)AKI----acute kidney injury  -Creatinine currently around 1.5 - renally adjust medications, avoid nephrotoxic agents / dehydration  / hypotension  8)HTN--- c restart Cardizem and doxazosin, hold lisinopril - 9)Social/Ethics--remains a full code, confirmed with his wife-  Disposition/Need for in-Hospital Stay- patient unable to be discharged at this time due to remains intubated and sedated- --requiring ventilatory support and iv antibiotics   Status is: Inpatient   Disposition: The patient is from: Home              Anticipated d/c is to: Home              Anticipated d/c date is: > 3 days              Patient currently is not medically stable to d/c. Barriers: Not Clinically Stable-   Code Status :  -  Code Status: Full Code   Family Communication:   updated wife and son at [phone]  DVT Prophylaxis  :   - SCDs  heparin injection 5,000 Units Start: 09/28/21 2230  Lab Results  Component Value Date   PLT 157 09/30/2021  Inpatient Medications  Scheduled Meds:  chlorhexidine gluconate (MEDLINE KIT)  15 mL Mouth Rinse BID   Chlorhexidine Gluconate Cloth  6 each Topical Daily   heparin   5,000 Units Subcutaneous Q8H   insulin aspart  0-9 Units Subcutaneous Q6H   ipratropium-albuterol  3 mL Nebulization QID   mouth rinse  15 mL Mouth Rinse BID   methylPREDNISolone (SOLU-MEDROL) injection  40 mg Intravenous Q12H   [START ON 10/01/2021] pantoprazole  40 mg Oral Daily   polyethylene glycol  17 g Per Tube Daily   Continuous Infusions:  azithromycin 500 mg (09/30/21 1238)   cefTRIAXone (ROCEPHIN)  IV 1 g (09/30/21 1132)   PRN Meds:.acetaminophen **OR** acetaminophen, albuterol, ondansetron **OR** ondansetron (ZOFRAN) IV   Anti-infectives (From admission, onward)    Start     Dose/Rate Route Frequency Ordered Stop   09/29/21 1200  azithromycin (ZITHROMAX) 500 mg in sodium chloride 0.9 % 250 mL IVPB        500 mg 250 mL/hr over 60 Minutes Intravenous Every 24 hours 09/29/21 1041     09/29/21 1130  cefTRIAXone (ROCEPHIN) 1 g in sodium chloride 0.9 % 100 mL IVPB        1 g 200 mL/hr over 30 Minutes Intravenous Every 24 hours 09/29/21 1041     09/28/21 2245  piperacillin-tazobactam (ZOSYN) IVPB 3.375 g  Status:  Discontinued        3.375 g 12.5 mL/hr over 240 Minutes Intravenous Every 8 hours 09/28/21 2154 09/29/21 1041       Subjective: [name] today has no fevers, no emesis,     -Use BiPAP overnight and did okay -Cough and shortness of breath improving slowly -Tolerating oral intake   Objective: Vitals:   09/30/21 1520 09/30/21 1616 09/30/21 1742 09/30/21 1809  BP: (!) 142/103  139/88 (!) 143/73  Pulse: 88   78  Resp: 20  (!) 22 16  Temp:  98.1 F (36.7 C)  97.8 F (36.6 C)  TempSrc:  Oral  Oral  SpO2: (!) 89%   91%  Weight:      Height:        Intake/Output Summary (Last 24 hours) at 09/30/2021 1815 Last data filed at 09/30/2021 1400 Gross per 24 hour  Intake 950 ml  Output 800 ml  Net 150 ml   Filed Weights   09/28/21 2120 09/29/21 0400 09/30/21 0436  Weight: 88.3 kg 87.4 kg 86.9 kg    Physical Exam  Gen:-Awake and alert, no acute  distress  HEENT:- [place] 2L/min Lungs-diminished, right more than left, no wheezing CV- S1, S2 normal, irregularly irregular Abd-  +ve B.Sounds, Abd Soft, ND, nontender Extremity/Skin:- No  edema, pedal pulses present  Neuro-Psych-appropriate affect, alert and oriented x3, no new neurodeficits   Data Reviewed: I have personally reviewed following labs and imaging studies  CBC: Recent Labs  Lab 09/28/21 1442 09/28/21 1444 09/29/21 0418 09/30/21 0357  WBC 3.2*  --  3.6* 4.2  NEUTROABS 2.2  --   --   --   HGB 15.8 18.7* 14.9 15.0  HCT 52.5* 55.0* 48.3 49.6  MCV 102.3*  --  95.6 99.2  PLT 181  --  167 672   Basic Metabolic Panel: Recent Labs  Lab 09/28/21 1442 09/28/21 1444 09/29/21 0418 09/30/21 0357  NA 140 143 141 143  K 4.4 4.6 3.1* 4.3  CL 98 98 98 98  CO2 35*  --  31 35*  GLUCOSE 138* 131* 81 125*  BUN 23  26* 24* 21  CREATININE 1.59* 1.50* 1.48* 1.46*  CALCIUM 8.7*  --  9.1 8.6*  PHOS  --   --   --  3.4   GFR: Estimated Creatinine Clearance: 52.2 mL/min (A) (by C-G formula based on SCr of 1.46 mg/dL (H)). Liver Function Tests: Recent Labs  Lab 09/28/21 1442 09/30/21 0357  AST 17  --   ALT 16  --   ALKPHOS 90  --   BILITOT 1.3*  --   PROT 7.5  --   ALBUMIN 3.9 3.5   Cardiac Enzymes: No results for input(s): CKTOTAL, CKMB, CKMBINDEX, TROPONINI in the last 168 hours. BNP (last 3 results) No results for input(s): PROBNP in the last 8760 hours. HbA1C: Recent Labs    09/29/21 0418  HGBA1C 6.6*   Sepsis Labs: _0 (procalcitonin:4,lacticidven:4) ) Recent Results (from the past 240 hour(s))  Resp Panel by RT-PCR (Flu A&B, Covid) Nasopharyngeal Swab     Status: None   Collection Time: 09/28/21  3:27 PM   Specimen: Nasopharyngeal Swab; Nasopharyngeal(NP) swabs in vial transport medium  Result Value Ref Range Status   SARS Coronavirus 2 by RT PCR NEGATIVE NEGATIVE Final    Comment: (NOTE) SARS-CoV-2 target nucleic acids are NOT DETECTED.  The  SARS-CoV-2 RNA is generally detectable in upper respiratory specimens during the acute phase of infection. The lowest concentration of SARS-CoV-2 viral copies this assay can detect is 138 copies/mL. A negative result does not preclude SARS-Cov-2 infection and should not be used as the sole basis for treatment or other patient management decisions. A negative result may occur with  improper specimen collection/handling, submission of specimen other than nasopharyngeal swab, presence of viral mutation(s) within the areas targeted by this assay, and inadequate number of viral copies(<138 copies/mL). A negative result must be combined with clinical observations, patient history, and epidemiological information. The expected result is Negative.  Fact Sheet for Patients:  EntrepreneurPulse.com.au  Fact Sheet for Healthcare Providers:  IncredibleEmployment.be  This test is no t yet approved or cleared by the [place] FDA and  has been authorized for detection and/or diagnosis of SARS-CoV-2 by FDA under an Emergency Use Authorization (EUA). This EUA will remain  in effect (meaning this test can be used) for the duration of the COVID-19 declaration under Section 564(b)(1) of the Act, 21 U.S.C.section 360bbb-3(b)(1), unless the authorization is terminated  or revoked sooner.       Influenza A by PCR NEGATIVE NEGATIVE Final   Influenza B by PCR NEGATIVE NEGATIVE Final    Comment: (NOTE) The Xpert Xpress SARS-CoV-2/FLU/RSV plus assay is intended as an aid in the diagnosis of influenza from Nasopharyngeal swab specimens and should not be used as a sole basis for treatment. Nasal washings and aspirates are unacceptable for Xpert Xpress SARS-CoV-2/FLU/RSV testing.  Fact Sheet for Patients: EntrepreneurPulse.com.au  Fact Sheet for Healthcare Providers: IncredibleEmployment.be  This test is not yet approved or  cleared by the [place] FDA and has been authorized for detection and/or diagnosis of SARS-CoV-2 by FDA under an Emergency Use Authorization (EUA). This EUA will remain in effect (meaning this test can be used) for the duration of the COVID-19 declaration under Section 564(b)(1) of the Act, 21 U.S.C. section 360bbb-3(b)(1), unless the authorization is terminated or revoked.  Performed at [facility], [address], [place], [place] [address]   MRSA Next Gen by PCR, Nasal     Status: None   Collection Time: 09/29/21  4:30 AM   Specimen: Nasal Mucosa; Nasal  Swab  Result Value Ref Range Status   MRSA by PCR Next Gen NOT DETECTED NOT DETECTED Final    Comment: (NOTE) The GeneXpert MRSA Assay (FDA approved for NASAL specimens only), is one component of a comprehensive MRSA colonization surveillance program. It is not intended to diagnose MRSA infection nor to guide or monitor treatment for MRSA infections. Test performance is not FDA approved in patients less than 61 years old. Performed at [facility], [address], [place], [place] [address]       Radiology Studies: CT CHEST WO CONTRAST  Result Date: 09/28/2021 CLINICAL DATA:  Hypoxia and ventilator dependent respiratory failure. EXAM: CT CHEST WITHOUT CONTRAST TECHNIQUE: Multidetector CT imaging of the chest was performed following the standard protocol without IV contrast. RADIATION DOSE REDUCTION: This exam was performed according to the departmental dose-optimization program which includes automated exposure control, adjustment of the mA and/or kV according to patient size and/or use of iterative reconstruction technique. COMPARISON:  Chest CT without contrast 07/27/2021, CT abdomen pelvis with contrast 01/02/2018, and 2 portable chest films from today, PA Lat chest from 06/09/2021. FINDINGS: Support tubes: ETT tip is 6.5 cm from the carina, at the level of the heads of the clavicles. NGT is in place. The proximal side-hole is at  the esophageal hiatus and should be advanced further in 8-10 cm for optimal placement. Cardiovascular: There's severe cardiomegaly. Small pericardial effusion is again noted. There are distended central pulmonary veins, slightly prominent pulmonary trunk 3.3 cm indicating arterial hypertension but unchanged. The venous distention is also similar at least to 07/27/2021. Minimal scattered aortic atherosclerosis. There is aortic tortuosity without aneurysm. Normal great vessel branching. Mediastinum/Nodes: No thyroid or axillary mass. Mildly enlarged mediastinal lymph nodes are redemonstrated up to 1.2 cm in short axis with few similar mildly prominent bilateral hilar nodes. No tracheal mass is seen. There is mild fluid in the upper thoracic esophagus without wall thickening. Lungs/Pleura: Moderate-sized layering right pleural effusion is increased, minimal layering left pleural effusion developed. There is compressive collapse/consolidation of the entire right lower lobe, with the right lower lobe main bronchus filled with fluid and debris possibly aspirated material. Centrilobular and paraseptal emphysematous changes in the right-greater-than-left upper lobes are again shown scattered linear scar-like opacities. 6 mm chronic right middle lobe nodule series 4 axial 116, stable and presumed benign. There previously was a 4 mm pleural-based left lower lobe nodule which is no longer seen. Bullous disease is again demonstrated in the base of the right middle lobe with additional linear scar-like opacity. There is mild dependent atelectasis in the left lung. Left-sided central airways are clear.  There is no pneumothorax. Upper Abdomen: Cirrhotic liver again noted, increased small-volume upper abdominal ascites. No splenomegaly. Left adrenal hyperplasia. Musculoskeletal: There is degenerative change of lower cervical and lower thoracic spine, slight thoracic dextroscoliosis. No concerning regional bone lesion. IMPRESSION:  1. ETT tip 6.5 cm over the carina; NGT proximal side port at the hiatus and should be advanced further in 8-10 cm. 2. Severe cardiomegaly, small pericardial effusion, with prominent pulmonary trunk and central pulmonary veins. No findings of significant subpleural edema, and no body wall anasarca. Small volume upper abdominal ascites could be congestive etiology or related to cirrhosis. 3. Interval worsening moderate-sized right pleural effusion with adjacent collapse or consolidation of the right lower lobe. The right lower lobe bronchus is filled with fluid and debris which was probably aspirated. Dependent debris was noted in the right main bronchus on 07/27/2021. 4. COPD, minimal left  pleural effusion, and scarring changes. 5. Mediastinal adenopathy is unchanged from 2 months ago but remains indeterminate. PET-CT or three-month follow-up CT recommended. 6. Aortic atherosclerosis. Electronically Signed   By: Telford Nab M.D.   On: 09/28/2021 21:31   DG CHEST PORT 1 VIEW  Result Date: 09/30/2021 CLINICAL DATA:  Hypoxia.  Extubation. EXAM: PORTABLE CHEST 1 VIEW COMPARISON:  09/29/2021 and prior radiographs FINDINGS: An endotracheal tube and NG tube have been removed. Cardiomediastinal silhouette is unchanged. Pulmonary vascular congestion and bibasilar opacities/atelectasis, RIGHT greater than LEFT, again noted. A small RIGHT pleural effusion is again noted. No pneumothorax or acute bony abnormalities. IMPRESSION: Endotracheal tube and NG tube removal without other significant change. Bibasilar opacities/atelectasis, RIGHT greater than LEFT, and small RIGHT pleural effusion. Electronically Signed   By: Margarette Canada M.D.   On: 09/30/2021 09:20   Portable Chest xray  Result Date: 09/29/2021 CLINICAL DATA:  Hypoxia and ventilator dependent respiratory failure. EXAM: PORTABLE CHEST 1 VIEW COMPARISON:  Portable chest yesterday at 7:45 p.m. FINDINGS: 5:56 a.m., 09/29/2021. There are multiple overlying monitor  wires. ETT tip is 9 cm from the carina, NGT enters the stomach. The heart is enlarged. There is improvement in perihilar vascular congestion today's exam with only slight central vascular fullness now seen. Again noted, there is small left, small-to-moderate right pleural effusion and overlying hazy consolidation or atelectasis in the right base. Remainder of the lungs are clear. No new or worsening abnormality. Stable mediastinum. Intact thoracic cage. IMPRESSION: 1. Improved central vascular prominence now minimal. No overt edema. 2. No interval change in pleural effusions and right basilar hazy consolidation or atelectasis. Electronically Signed   By: Telford Nab M.D.   On: 09/29/2021 06:14   DG Chest Port 1 View  Result Date: 09/28/2021 CLINICAL DATA:  Intubation. EXAM: PORTABLE CHEST 1 VIEW COMPARISON:  Diarrhea today FINDINGS: Endotracheal tube tip is at the level of the clavicular heads. Enteric tube tip below the diaphragm, the side port is in the region of the gastroesophageal junction. Chronic cardiomegaly moderate-sized right pleural effusion is again seen. Small suspected left pleural effusion. There is ill-defined right perihilar opacity. No pneumothorax. No convincing pulmonary edema. IMPRESSION: 1. Endotracheal tube tip at the level of the clavicular heads. 2. Enteric tube tip below the diaphragm, the side port in the region of the gastroesophageal junction. Advancement of at least 4-5 cm would place the side-port in the stomach. 3. Cardiomegaly with bilateral pleural effusions, right greater than left. Electronically Signed   By: Keith Rake M.D.   On: 09/28/2021 19:59    Scheduled Meds:  chlorhexidine gluconate (MEDLINE KIT)  15 mL Mouth Rinse BID   Chlorhexidine Gluconate Cloth  6 each Topical Daily   heparin  5,000 Units Subcutaneous Q8H   insulin aspart  0-9 Units Subcutaneous Q6H   ipratropium-albuterol  3 mL Nebulization QID   mouth rinse  15 mL Mouth Rinse BID    methylPREDNISolone (SOLU-MEDROL) injection  40 mg Intravenous Q12H   [START ON 10/01/2021] pantoprazole  40 mg Oral Daily   polyethylene glycol  17 g Per Tube Daily   Continuous Infusions:  azithromycin 500 mg (09/30/21 1238)   cefTRIAXone (ROCEPHIN)  IV 1 g (09/30/21 1132)     LOS: 2 days   Roxan Hockey M.D on 09/30/2021 at 6:15 PM  Go to [email or web address] - for contact info  [facility] - Office  [phone]  If 7PM-7AM, please contact night-coverage [email or web address] Password [facility] 09/30/2021, 6:15 PM

## 2021-10-01 LAB — BASIC METABOLIC PANEL
Anion gap: 7 (ref 5–15)
BUN: 25 mg/dL — ABNORMAL HIGH (ref 8–23)
CO2: 37 mmol/L — ABNORMAL HIGH (ref 22–32)
Calcium: 8.8 mg/dL — ABNORMAL LOW (ref 8.9–10.3)
Chloride: 99 mmol/L (ref 98–111)
Creatinine, Ser: 1.23 mg/dL (ref 0.61–1.24)
GFR, Estimated: >60 mL/min (ref >60)
Glucose, Bld: 121 mg/dL — ABNORMAL HIGH (ref 70–99)
Potassium: 4.6 mmol/L (ref 3.5–5.1)
Sodium: 143 mmol/L (ref 135–145)

## 2021-10-01 LAB — CBC
HCT: 50.1 % (ref 39.0–52.0)
Hemoglobin: 14.8 g/dL (ref 13.0–17.0)
MCH: 29.4 pg (ref 26.0–34.0)
MCHC: 29.5 g/dL — ABNORMAL LOW (ref 30.0–36.0)
MCV: 99.6 fL (ref 80.0–100.0)
Platelets: 163 10*3/uL (ref 150–400)
RBC: 5.03 MIL/uL (ref 4.22–5.81)
RDW: 18 % — ABNORMAL HIGH (ref 11.5–15.5)
WBC: 4.9 10*3/uL (ref 4.0–10.5)
nRBC: 0 % (ref 0.0–0.2)

## 2021-10-01 LAB — BRAIN NATRIURETIC PEPTIDE: B Natriuretic Peptide: 325 pg/mL — ABNORMAL HIGH (ref 0.0–100.0)

## 2021-10-01 LAB — GLUCOSE, CAPILLARY
Glucose-Capillary: 120 mg/dL — ABNORMAL HIGH (ref 70–99)
Glucose-Capillary: 130 mg/dL — ABNORMAL HIGH (ref 70–99)
Glucose-Capillary: 191 mg/dL — ABNORMAL HIGH (ref 70–99)

## 2021-10-01 MED ORDER — FUROSEMIDE 40 MG PO TABS: 40.0000 mg | ORAL_TABLET | Freq: Every morning | ORAL | 1 refills | Status: DC

## 2021-10-01 MED ORDER — AZITHROMYCIN 250 MG PO TABS
500.0000 mg | ORAL_TABLET | Freq: Once | ORAL | Status: AC
  Administered 2021-10-01: 500 mg via ORAL
  Filled 2021-10-01: qty 2

## 2021-10-01 MED ORDER — SODIUM CHLORIDE 0.9 % IV SOLN
2.0000 g | Freq: Once | INTRAVENOUS | Status: AC
  Administered 2021-10-01: 2 g via INTRAVENOUS
  Filled 2021-10-01: qty 20

## 2021-10-01 MED ORDER — APIXABAN 5 MG PO TABS: 5.0000 mg | ORAL_TABLET | Freq: Two times a day (BID) | ORAL | 3 refills | Status: AC

## 2021-10-01 MED ORDER — CEFDINIR 300 MG PO CAPS: 300.0000 mg | ORAL_CAPSULE | Freq: Two times a day (BID) | ORAL | 0 refills | Status: AC

## 2021-10-01 MED ORDER — ALBUTEROL SULFATE HFA 108 (90 BASE) MCG/ACT IN AERS: 2.0000 | INHALATION_SPRAY | RESPIRATORY_TRACT | 2 refills | Status: AC | PRN

## 2021-10-01 MED ORDER — DILTIAZEM HCL ER COATED BEADS 240 MG PO CP24: 240.0000 mg | ORAL_CAPSULE | Freq: Every day | ORAL | 3 refills | Status: AC

## 2021-10-01 MED ORDER — DOXAZOSIN MESYLATE 4 MG PO TABS: 4.0000 mg | ORAL_TABLET | Freq: Every day | ORAL | 3 refills | Status: AC

## 2021-10-01 MED ORDER — POTASSIUM CHLORIDE ER 10 MEQ PO TBCR: 20.0000 meq | EXTENDED_RELEASE_TABLET | Freq: Every day | ORAL | 2 refills | Status: AC

## 2021-10-01 MED ORDER — AZITHROMYCIN 500 MG PO TABS: 500.0000 mg | ORAL_TABLET | Freq: Every day | ORAL | 0 refills | Status: AC

## 2021-10-01 MED ORDER — PREDNISONE 20 MG PO TABS: 40.0000 mg | ORAL_TABLET | Freq: Every day | ORAL | 0 refills | Status: AC

## 2021-10-01 MED ORDER — METFORMIN HCL 500 MG PO TABS: 500.0000 mg | ORAL_TABLET | Freq: Two times a day (BID) | ORAL | 3 refills | Status: AC

## 2021-10-01 MED ORDER — FARXIGA 10 MG PO TABS: 10.0000 mg | ORAL_TABLET | Freq: Every day | ORAL | 3 refills | Status: AC

## 2021-10-01 MED ORDER — ALBUTEROL SULFATE (2.5 MG/3ML) 0.083% IN NEBU: 2.5000 mg | INHALATION_SOLUTION | RESPIRATORY_TRACT | 2 refills | Status: AC | PRN

## 2021-10-01 MED ORDER — FUROSEMIDE 10 MG/ML IJ SOLN
20.0000 mg | Freq: Once | INTRAMUSCULAR | Status: AC
  Administered 2021-10-01: 20 mg via INTRAVENOUS
  Filled 2021-10-01: qty 2

## 2021-10-01 MED ORDER — FERROUS SULFATE 325 (65 FE) MG PO TABS: 325.0000 mg | ORAL_TABLET | Freq: Every day | ORAL | 3 refills | Status: AC

## 2021-10-01 NOTE — Discharge Summary (Signed)
Manuel [name], is a 76 y.o. male  DOB [birth year]  MRN [id number].  Admission date:  09/28/2021  Admitting Physician  Bethena Roys, MD  Discharge Date:  10/01/2021   Primary MD  [name], DO  Recommendations for primary care physician for things to follow:  1) please take breathing treatments and antibiotics as prescribed 2) please note that there has been some changes to your medication regimen 3) please follow-up with primary care physician in about 2 weeks or so for repeat chest x-ray and reevaluation 4)Very low-salt diet advised 5)Weigh yourself daily, call if you gain more than 3 pounds in 1 day or more than 5 pounds in 1 week as your diuretic medications may need to be adjusted 6) please continue to use CPAP every time you go to bed  Admission Diagnosis  Acute respiratory failure with hypoxia and hypercapnia ([place]) [J96.01, J96.02] Respiratory failure with hypercapnia, unspecified chronicity (HCC) [J96.92]   Discharge Diagnosis  Acute respiratory failure with hypoxia and hypercapnia (HCC) [J96.01, J96.02] Respiratory failure with hypercapnia, unspecified chronicity ([place]) [J96.92]   Principal Problem:   Acute on chronic respiratory failure with hypoxia and hypercapnia (HCC) Active Problems:   OSA (obstructive sleep apnea)   Acute encephalopathy   AKI (acute kidney injury) ([place])   Atrial fibrillation ([place])   DM (diabetes mellitus) ([place])   Primary hypertension   Cor pulmonale, chronic ([place])   Aspiration pneumonia ([place])      Past Medical History:  Diagnosis Date   AAA (abdominal aortic aneurysm)    per cardiologist note dated 03-14-2018 last duplex , 30-2m  (dr gEinar Gip   Acute GI bleeding 12/19/2017   Allergic rhinitis, seasonal    Anemia secondary to renal failure    Anticoagulant long-term use    ELIQUIS   Arthritis    LOWER BACK   B12 deficiency    BPH (benign prostatic  hyperplasia)    CKD (chronic kidney disease), stage III ([place]    Colonic stricture ([place] 12/13/2017   Colostomy present ([place]    Congenital absence of left kidney [facility] dr detarding   per CT 12-11-2017 and 01-02-2018   Duodenal ulcer 12/20/2017   Dyslipidemia    Esophageal candidiasis ([place] 12/19/2017   Gastric ulcer without hemorrhage or perforation 12/20/2017   History of bowel resection 12/13/2017   for sigmoid stricture-- emergency sigmoidectomy   Hypercholesteremia 09/19/2018   Hyperparathyroidism, secondary renal ([place]    Hypertension    Left carotid bruit    OSA on CPAP    followed by [facility]   Permanent atrial fibrillation ([facility] 2017   cardiologist-  dr gEinar Gip  Solitary right kidney    Type 2 diabetes mellitus ([place]    followed by pcp   Wears glasses    Wears partial dentures    UPPER AND LOWER    Past Surgical History:  Procedure Laterality Date   BIOPSY  12/19/2017   Procedure: BIOPSY;  Surgeon: HCarol Ada MD;  [facility];  Service: Endoscopy;;   CARDIOVASCULAR STRESS TEST  11/16/2011   low risk nuclear study w/ no ischemia/  normal LV function and wall motion , ef 60%   CARDIOVERSION  12/18/2011   Procedure: CARDIOVERSION;  Surgeon: Laverda Page, MD;  Location: [facility];  Service: Cardiovascular;  Laterality: N/A;   CATARACT EXTRACTION W/ INTRAOCULAR LENS IMPLANT Right 2015   COLON RESECTION N/A 12/13/2017   Procedure: LAPAROSCOPIC END COLOSTOMY HARTMAN'S PROCEDURE;  Surgeon: Leighton Ruff, MD;  [facility];  Service: General;  Laterality: N/A;   COLOSTOMY TAKEDOWN N/A 05/29/2018   Procedure: LAPAROSCOPIC COLOSTOMY REVERSAL ERAS PATHWAY, RIGID PROCTOSCOPY;  Surgeon: Leighton Ruff, MD;  [facility];  Service: General;  Laterality: N/A;   CYST REMOVED FROM UPPER BACK  09/2017   BENIGN   ESOPHAGOGASTRODUODENOSCOPY N/A 12/19/2017   Procedure: ESOPHAGOGASTRODUODENOSCOPY (EGD);  Surgeon:  Carol Ada, MD;  [facility];  Service: Endoscopy;  Laterality: N/A;   FLEXIBLE SIGMOIDOSCOPY N/A 12/13/2017   Procedure: FLEXIBLE SIGMOIDOSCOPY;  Surgeon: Carol Ada, MD;  [facility];  Service: Endoscopy;  Laterality: N/A;   INGUINAL HERNIA REPAIR Left 1980s   AND REMOVAL CYST ON UPPER BACK   PAROTIDECTOMY Right 09-21-1999   dr Janace Hoard _0    salivery gland mass  (benign warthin's tumor)   RIGHT HEART CATH N/A 08/22/2021   Procedure: RIGHT HEART CATH;  Surgeon: Adrian Prows, MD;  Location: [facility];  Service: Cardiovascular;  Laterality: N/A;   TRANSTHORACIC ECHOCARDIOGRAM  01/04/2018   mild LVH, ef 56-38%, grade 2 diastolic function/  trivial AR and MR/  mild LAE/  mild to moderate TR       HPI  from the history and physical done on the day of admission:    Chief Complaint: Altered mental status   HPI: Manuel [name] is a 76 y.o. male with medical history significant for OSA on CPAP, atrial fibrillation, diabetes mellitus, hypertension. Patient was brought to the ED reports of altered mental status.  EMS was called due to altered mental status, they also noted right facial droop and drooling.  Patient would not grip or follow commands. At the time of my initial evaluation, patient is on BiPAP, patient is almost obtunded, and then subsequently woke up to verbal and tactile stimuli, opened his eyes and verbalized.   I spoke to patient's wife on the phone, patient did not report any difficulty breathing, does no cough noted.  No chest pain.  Lower extremity swelling improved compared to prior.  She reports patient has been compliant with his Eliquis and Lasix as far as she knows.  He is on CPAP but she reports he has been compliant.  He is supposed to be on home O2, which was first prescribed about 1 to 2 months ago, but patient has not been using it.    He went to see his doctor today, his O2 sats was 67%, he declined referral to the ED, he went home, put  on his home O2 and about an hour or 2 later, patient was unresponsive.   EMS reports that patient had facial droop on the right side and he was drooling and wont grip.  Spouse tells me that she noticed only a droop on the left side, she did not notice any facial asymmetry.     ED Course: Temperature 98.  Heart rate 70s to 90s.  Respiratory rate 12-24.  Blood pressure systolic 756E to 332R.  O2 sats 75% on room air,  placed on 15 L nonrebreather and subsequently placed on BiPAP.  Code stroke was called, head CT was negative for acute abnormality.  Telemetry neurologist Dr. Cheral Marker- deemed that his presentation was respiratory in etiology.  Code stroke was canceled. ABG showed pH of 7.24, PCO2 of 90, PO2 of 71.  Chest x-ray showed moderate right pleural effusion Patient was given about an hour on BiPAP, on reevaluation he was more awake, but ABG showed worsening respiratory acidosis. Patient was subsequently intubated.   Review of Systems: Unable to assess due to altered mental status    Hospital Course:   Brief Narrative:     76 y.o. male with medical history significant for OSA on CPAP, atrial fibrillation, diabetes mellitus, hypertension admitted on 09/28/2021 with acute on chronic hypoxic and hypercapnic respiratory failure, failed BiPAP and was intubated due to unresponsiveness and persistent respiratory acidosis -Extubated successfully on 09/29/2021   Assessment and Plan: 1) Acute on chronic respiratory failure with hypoxia and hypercapnia (HCC) -History of COPD/emphysema -History of OSA on CPAP on home O2 -Apparently noncompliant with oxygen, somewhat more compliant with CPAP PTA -Failed BiPAP in the ED with worsening respiratory acidosis and unresponsiveness while on BiPAP Intubated on 09/28/2021 -Repeat ABG while intubated shows significant improvement in respiratory acidosis -Prior PFTs suggested restrictive lung disease -High-resolution CT previously failed to confirm ILD -Has  evidence of COPD/emphysema --- Pulmonology consult appreciated -Suspect COPD exacerbation - -Treated with IV Solu-Medrol, bronchodilators, azithromycin and Rocephin -Stopped IV Zosyn --Extubated successfully on 09/29/2021 -BiPAP nightly advised -Overall much improved, no hypoxia at rest, no dyspnea on exertion, -Cough is improving -Okay to discharge home on prednisone, Omnicef and azithromycin -Repeat chest x-ray in a couple of weeks with PCP advised   2) right-sided pneumonia with possible parapneumonic effusion on the right-- Admission CT chest on 09/28/2021 showed interval worsening moderate-sized right pleural effusion with adjacent collapse or consolidation of the right lower lobe. The right lower lobe bronchus is filled with fluid and debris which was probably aspirated. -Management as above #1 -Clinically and radiologically improved   3)HFpEF/Mild Pulm HTN-- -Echo from December 2022 with EF of 60 to 65%, severe tricuspid regurgitation, mild pulmonary hypertension -Suspect patient has chronic diastolic dysfunction CHF - Chest x-ray on admission showing moderate right pleural effusion.  BNP improving -Discharged home on Lasix   4) chronic atrial fibrillation--- PTA patient was on Cardizem for rate control and Eliquis for stroke prophylaxis -Resume Cardizem and Eliquis   -5) acute metabolic encephalopathy----due to severe hypercapnia -Please see #1 above -Mentation is back to baseline after extubation   6)DM2-A1c 6.6 reflecting excellent diabetic control PTA -Okay to resume metformin and faxiga    7)AKI----acute kidney injury  -AKI resolved, creatinine normalized - renally adjust medications, avoid nephrotoxic agents / dehydration  / hypotension   8)HTN--- continue Cardizem and reduce doxazosin - 9)Social/Ethics--patient is a Theme park manager of a [place] home in stable condition without hypoxia at rest okay to continue to use oxygen and CPAP  nightly  Discharge Condition: stable  Diet and Activity recommendation:  As advised  Discharge Instructions   Discharge Instructions     Diet - low sodium heart healthy   Complete by: As directed    Discharge instructions   Complete by: As directed    1) please take breathing treatments and antibiotics as prescribed 2) please note that there has been some changes to your medication regimen 3) please follow-up with primary care physician in about 2 weeks or so  for repeat chest x-ray and reevaluation 4)Very low-salt diet advised 5)Weigh yourself daily, call if you gain more than 3 pounds in 1 day or more than 5 pounds in 1 week as your diuretic medications may need to be adjusted 6) please continue to use CPAP every time you go to bed   For home use only DME Nebulizer machine   Complete by: As directed    Patient needs a nebulizer to treat with the following condition: Dyspnea and respiratory abnormalities   Length of Need: Lifetime   Increase activity slowly   Complete by: As directed          Discharge Medications     Allergies as of 10/01/2021       Reactions   Sulfa Antibiotics Shortness Of Breath   Headaches    Colchicine    Other reaction(s): diarrhea   Labetalol Hcl    Other reaction(s): dysuria   Tramadol Hcl    Other reaction(s): nausea        Medication List     STOP taking these medications    diltiazem 120 MG tablet Commonly known as: CARDIZEM       TAKE these medications    acetaminophen 500 MG tablet Commonly known as: TYLENOL Take 500 mg by mouth every 6 (six) hours as needed for moderate pain.   albuterol 108 (90 Base) MCG/ACT inhaler Commonly known as: Ventolin HFA Inhale 2 puffs into the lungs every 4 (four) hours as needed for wheezing or shortness of breath. What changed: when to take this   albuterol (2.5 MG/3ML) 0.083% nebulizer solution Commonly known as: PROVENTIL Take 3 mLs (2.5 mg total) by nebulization every 4 (four)  hours as needed for wheezing or shortness of breath. What changed: You were already taking a medication with the same name, and this prescription was added. Make sure you understand how and when to take each.   apixaban 5 MG Tabs tablet Commonly known as: Eliquis Take 1 tablet (5 mg total) by mouth 2 (two) times daily. What changed: how much to take   azithromycin 500 MG tablet Commonly known as: ZITHROMAX Take 1 tablet (500 mg total) by mouth daily for 5 days.   cefdinir 300 MG capsule Commonly known as: OMNICEF Take 1 capsule (300 mg total) by mouth 2 (two) times daily for 5 days.   D3-50 1.25 MG (50000 UT) capsule Generic drug: Cholecalciferol Take 50,000 Units by mouth every Wednesday.   diltiazem 240 MG 24 hr capsule Commonly known as: CARDIZEM CD Take 1 capsule (240 mg total) by mouth daily. Start taking on: October 02, 2021   doxazosin 4 MG tablet Commonly known as: CARDURA Take 1 tablet (4 mg total) by mouth at bedtime. What changed: Another medication with the same name was removed. Continue taking this medication, and follow the directions you see here.   Farxiga 10 MG Tabs tablet Generic drug: dapagliflozin propanediol Take 1 tablet (10 mg total) by mouth daily.   ferrous sulfate 325 (65 FE) MG tablet Take 1 tablet (325 mg total) by mouth daily with breakfast. What changed: when to take this   furosemide 40 MG tablet Commonly known as: LASIX Take 1 tablet (40 mg total) by mouth in the morning. What changed: See the new instructions.   lisinopril 10 MG tablet Commonly known as: ZESTRIL Take 1 tablet (10 mg total) by mouth at bedtime.   metFORMIN 500 MG tablet Commonly known as: GLUCOPHAGE Take 1 tablet (500 mg total) by  mouth 2 (two) times daily with a meal.   potassium chloride 10 MEQ tablet Commonly known as: KLOR-CON Take 2 tablets (20 mEq total) by mouth daily. Take while Taking Laisx/Furosemide What changed:  when to take this reasons to take  this additional instructions   pravastatin 20 MG tablet Commonly known as: PRAVACHOL Take 20 mg by mouth every evening.   predniSONE 20 MG tablet Commonly known as: DELTASONE Take 2 tablets (40 mg total) by mouth daily with breakfast for 5 days.   vitamin B-12 1000 MCG tablet Commonly known as: CYANOCOBALAMIN Take 1,000 mcg by mouth 2 (two) times daily.               Durable Medical Equipment  (From admission, onward)           Start     Ordered   10/01/21 0000  For home use only DME Nebulizer machine       Question Answer Comment  Patient needs a nebulizer to treat with the following condition Dyspnea and respiratory abnormalities   Length of Need Lifetime      10/01/21 1043            Major procedures and Radiology Reports - PLEASE review detailed and final reports for all details, in brief -  CT Head Wo Contrast  Result Date: 09/28/2021 CLINICAL DATA:  Mental status change, unknown cause EXAM: CT HEAD WITHOUT CONTRAST TECHNIQUE: Contiguous axial images were obtained from the base of the skull through the vertex without intravenous contrast. RADIATION DOSE REDUCTION: This exam was performed according to the departmental dose-optimization program which includes automated exposure control, adjustment of the mA and/or kV according to patient size and/or use of iterative reconstruction technique. COMPARISON:  None. FINDINGS: Brain: There is no acute intracranial hemorrhage, mass effect, or edema. Gray-white differentiation is preserved. There is no extra-axial fluid collection. Ventricles and sulci are within normal limits in size and configuration. Vascular: No hyperdense vessel or unexpected calcification. Skull: Calvarium is unremarkable. Sinuses/Orbits: No acute finding.  Right lens replacement. Other: None. IMPRESSION: No acute intracranial hemorrhage or evidence of acute infarction. Electronically Signed   By: Macy Mis M.D.   On: 09/28/2021 15:45   CT CHEST  WO CONTRAST  Result Date: 09/28/2021 CLINICAL DATA:  Hypoxia and ventilator dependent respiratory failure. EXAM: CT CHEST WITHOUT CONTRAST TECHNIQUE: Multidetector CT imaging of the chest was performed following the standard protocol without IV contrast. RADIATION DOSE REDUCTION: This exam was performed according to the departmental dose-optimization program which includes automated exposure control, adjustment of the mA and/or kV according to patient size and/or use of iterative reconstruction technique. COMPARISON:  Chest CT without contrast 07/27/2021, CT abdomen pelvis with contrast 01/02/2018, and 2 portable chest films from today, PA Lat chest from 06/09/2021. FINDINGS: Support tubes: ETT tip is 6.5 cm from the carina, at the level of the heads of the clavicles. NGT is in place. The proximal side-hole is at the esophageal hiatus and should be advanced further in 8-10 cm for optimal placement. Cardiovascular: There's severe cardiomegaly. Small pericardial effusion is again noted. There are distended central pulmonary veins, slightly prominent pulmonary trunk 3.3 cm indicating arterial hypertension but unchanged. The venous distention is also similar at least to 07/27/2021. Minimal scattered aortic atherosclerosis. There is aortic tortuosity without aneurysm. Normal great vessel branching. Mediastinum/Nodes: No thyroid or axillary mass. Mildly enlarged mediastinal lymph nodes are redemonstrated up to 1.2 cm in short axis with few similar mildly prominent bilateral hilar nodes. No  tracheal mass is seen. There is mild fluid in the upper thoracic esophagus without wall thickening. Lungs/Pleura: Moderate-sized layering right pleural effusion is increased, minimal layering left pleural effusion developed. There is compressive collapse/consolidation of the entire right lower lobe, with the right lower lobe main bronchus filled with fluid and debris possibly aspirated material. Centrilobular and paraseptal  emphysematous changes in the right-greater-than-left upper lobes are again shown scattered linear scar-like opacities. 6 mm chronic right middle lobe nodule series 4 axial 116, stable and presumed benign. There previously was a 4 mm pleural-based left lower lobe nodule which is no longer seen. Bullous disease is again demonstrated in the base of the right middle lobe with additional linear scar-like opacity. There is mild dependent atelectasis in the left lung. Left-sided central airways are clear.  There is no pneumothorax. Upper Abdomen: Cirrhotic liver again noted, increased small-volume upper abdominal ascites. No splenomegaly. Left adrenal hyperplasia. Musculoskeletal: There is degenerative change of lower cervical and lower thoracic spine, slight thoracic dextroscoliosis. No concerning regional bone lesion. IMPRESSION: 1. ETT tip 6.5 cm over the carina; NGT proximal side port at the hiatus and should be advanced further in 8-10 cm. 2. Severe cardiomegaly, small pericardial effusion, with prominent pulmonary trunk and central pulmonary veins. No findings of significant subpleural edema, and no body wall anasarca. Small volume upper abdominal ascites could be congestive etiology or related to cirrhosis. 3. Interval worsening moderate-sized right pleural effusion with adjacent collapse or consolidation of the right lower lobe. The right lower lobe bronchus is filled with fluid and debris which was probably aspirated. Dependent debris was noted in the right main bronchus on 07/27/2021. 4. COPD, minimal left pleural effusion, and scarring changes. 5. Mediastinal adenopathy is unchanged from 2 months ago but remains indeterminate. PET-CT or three-month follow-up CT recommended. 6. Aortic atherosclerosis. Electronically Signed   By: Telford Nab M.D.   On: 09/28/2021 21:31   DG CHEST PORT 1 VIEW  Result Date: 09/30/2021 CLINICAL DATA:  Hypoxia.  Extubation. EXAM: PORTABLE CHEST 1 VIEW COMPARISON:  09/29/2021  and prior radiographs FINDINGS: An endotracheal tube and NG tube have been removed. Cardiomediastinal silhouette is unchanged. Pulmonary vascular congestion and bibasilar opacities/atelectasis, RIGHT greater than LEFT, again noted. A small RIGHT pleural effusion is again noted. No pneumothorax or acute bony abnormalities. IMPRESSION: Endotracheal tube and NG tube removal without other significant change. Bibasilar opacities/atelectasis, RIGHT greater than LEFT, and small RIGHT pleural effusion. Electronically Signed   By: Margarette Canada M.D.   On: 09/30/2021 09:20   Portable Chest xray  Result Date: 09/29/2021 CLINICAL DATA:  Hypoxia and ventilator dependent respiratory failure. EXAM: PORTABLE CHEST 1 VIEW COMPARISON:  Portable chest yesterday at 7:45 p.m. FINDINGS: 5:56 a.m., 09/29/2021. There are multiple overlying monitor wires. ETT tip is 9 cm from the carina, NGT enters the stomach. The heart is enlarged. There is improvement in perihilar vascular congestion today's exam with only slight central vascular fullness now seen. Again noted, there is small left, small-to-moderate right pleural effusion and overlying hazy consolidation or atelectasis in the right base. Remainder of the lungs are clear. No new or worsening abnormality. Stable mediastinum. Intact thoracic cage. IMPRESSION: 1. Improved central vascular prominence now minimal. No overt edema. 2. No interval change in pleural effusions and right basilar hazy consolidation or atelectasis. Electronically Signed   By: Telford Nab M.D.   On: 09/29/2021 06:14   DG Chest Port 1 View  Result Date: 09/28/2021 CLINICAL DATA:  Intubation. EXAM: PORTABLE CHEST 1 VIEW  COMPARISON:  Diarrhea today FINDINGS: Endotracheal tube tip is at the level of the clavicular heads. Enteric tube tip below the diaphragm, the side port is in the region of the gastroesophageal junction. Chronic cardiomegaly moderate-sized right pleural effusion is again seen. Small suspected left  pleural effusion. There is ill-defined right perihilar opacity. No pneumothorax. No convincing pulmonary edema. IMPRESSION: 1. Endotracheal tube tip at the level of the clavicular heads. 2. Enteric tube tip below the diaphragm, the side port in the region of the gastroesophageal junction. Advancement of at least 4-5 cm would place the side-port in the stomach. 3. Cardiomegaly with bilateral pleural effusions, right greater than left. Electronically Signed   By: Keith Rake M.D.   On: 09/28/2021 19:59   DG Chest Portable 1 View  Result Date: 09/28/2021 CLINICAL DATA:  Shortness of breath EXAM: PORTABLE CHEST 1 VIEW COMPARISON:  07/27/2021 FINDINGS: Cardiomegaly. Aortic atherosclerosis. Moderate sized right-sided pleural effusion with associated right basilar opacity. Suspect small left pleural effusion. No pneumothorax. IMPRESSION: Moderate sized right pleural effusion with associated right basilar opacity, likely atelectasis. Suspect small left pleural effusion. Electronically Signed   By: Davina Poke D.O.   On: 09/28/2021 14:59    Micro Results  Recent Results (from the past 240 hour(s))  Resp Panel by RT-PCR (Flu A&B, Covid) Nasopharyngeal Swab     Status: None   Collection Time: 09/28/21  3:27 PM   Specimen: Nasopharyngeal Swab; Nasopharyngeal(NP) swabs in vial transport medium  Result Value Ref Range Status   SARS Coronavirus 2 by RT PCR NEGATIVE NEGATIVE Final    Comment: (NOTE) SARS-CoV-2 target nucleic acids are NOT DETECTED.  The SARS-CoV-2 RNA is generally detectable in upper respiratory specimens during the acute phase of infection. The lowest concentration of SARS-CoV-2 viral copies this assay can detect is 138 copies/mL. A negative result does not preclude SARS-Cov-2 infection and should not be used as the sole basis for treatment or other patient management decisions. A negative result may occur with  improper specimen collection/handling, submission of specimen  other than nasopharyngeal swab, presence of viral mutation(s) within the areas targeted by this assay, and inadequate number of viral copies(<138 copies/mL). A negative result must be combined with clinical observations, patient history, and epidemiological information. The expected result is Negative.  Fact Sheet for Patients:  EntrepreneurPulse.com.au  Fact Sheet for Healthcare Providers:  IncredibleEmployment.be  This test is no t yet approved or cleared by the [place] FDA and  has been authorized for detection and/or diagnosis of SARS-CoV-2 by FDA under an Emergency Use Authorization (EUA). This EUA will remain  in effect (meaning this test can be used) for the duration of the COVID-19 declaration under Section 564(b)(1) of the Act, 21 U.S.C.section 360bbb-3(b)(1), unless the authorization is terminated  or revoked sooner.       Influenza A by PCR NEGATIVE NEGATIVE Final   Influenza B by PCR NEGATIVE NEGATIVE Final    Comment: (NOTE) The Xpert Xpress SARS-CoV-2/FLU/RSV plus assay is intended as an aid in the diagnosis of influenza from Nasopharyngeal swab specimens and should not be used as a sole basis for treatment. Nasal washings and aspirates are unacceptable for Xpert Xpress SARS-CoV-2/FLU/RSV testing.  Fact Sheet for Patients: EntrepreneurPulse.com.au  Fact Sheet for Healthcare Providers: IncredibleEmployment.be  This test is not yet approved or cleared by the [place] FDA and has been authorized for detection and/or diagnosis of SARS-CoV-2 by FDA under an Emergency Use Authorization (EUA). This EUA will remain in effect (meaning this  test can be used) for the duration of the COVID-19 declaration under Section 564(b)(1) of the Act, 21 U.S.C. section 360bbb-3(b)(1), unless the authorization is terminated or revoked.  Performed at [facility], [address] [place], [place]  [address]   MRSA Next Gen by PCR, Nasal     Status: None   Collection Time: 09/29/21  4:30 AM   Specimen: Nasal Mucosa; Nasal Swab  Result Value Ref Range Status   MRSA by PCR Next Gen NOT DETECTED NOT DETECTED Final    Comment: (NOTE) The GeneXpert MRSA Assay (FDA approved for NASAL specimens only), is one component of a comprehensive MRSA colonization surveillance program. It is not intended to diagnose MRSA infection nor to guide or monitor treatment for MRSA infections. Test performance is not FDA approved in patients less than 23 years old. Performed at [facility], [address], [place], [place] [address]     Today   Subjective    Owens Hara today has no new complaints  -Ambulating without dyspnea on exertion or chest pain No fever  Or chills  -Cough is improving          Patient has been seen and examined prior to discharge   Objective   Blood pressure (!) 96/58, pulse 77, temperature 97.6 F (36.4 C), temperature source Oral, resp. rate 18, height 6' 3" (1.905 m), weight 85.3 kg, SpO2 92 %.  No intake or output data in the 24 hours ending 10/01/21 1707  Exam Gen:- Awake Alert, no acute distress  HEENT:- [place].AT, No sclera icterus Neck-Supple Neck,No JVD,.  Lungs-improved air movement, no wheeze  CV- S1, S2 normal, irregular Abd-  +ve B.Sounds, Abd Soft, No tenderness,    Extremity/Skin:- No  edema,   good pulses Psych-affect is appropriate, oriented x3 Neuro-no new focal deficits, no tremors    Data Review   CBC w Diff:  Lab Results  Component Value Date   WBC 4.9 10/01/2021   HGB 14.8 10/01/2021   HGB 15.6 08/17/2021   HCT 50.1 10/01/2021   PLT 163 10/01/2021   PLT 150 08/17/2021   LYMPHOPCT 21 09/28/2021   MONOPCT 8 09/28/2021   EOSPCT 1 09/28/2021   BASOPCT 1 09/28/2021    CMP:  Lab Results  Component Value Date   NA 143 10/01/2021   K 4.6 10/01/2021   CL 99 10/01/2021   CO2 37 (H) 10/01/2021   BUN 25 (H) 10/01/2021   CREATININE 1.23  10/01/2021   CREATININE 1.27 (H) 05/17/2021   CREATININE 1.23 04/10/2011   PROT 7.5 09/28/2021   ALBUMIN 3.5 09/30/2021   BILITOT 1.3 (H) 09/28/2021   BILITOT 1.2 05/17/2021   ALKPHOS 90 09/28/2021   AST 17 09/28/2021   AST 14 (L) 05/17/2021   ALT 16 09/28/2021   ALT 12 05/17/2021  .  Total Discharge time is about 33 minutes  Roxan Hockey M.D on 10/01/2021 at 5:07 PM  Go to [email or web address] -  for contact info  [facility] - Office  [phone]   \

## 2021-10-01 NOTE — Discharge Instructions (Signed)
1) please take breathing treatments and antibiotics as prescribed ?2) please note that there has been some changes to your medication regimen ?3) please follow-up with primary care physician in about 2 weeks or so for repeat chest x-ray and reevaluation ?4)Very low-salt diet advised ?5)Weigh yourself daily, call if you gain more than 3 pounds in 1 day or more than 5 pounds in 1 week as your diuretic medications may need to be adjusted ?6) please continue to use CPAP every time you go to bed ?

## 2021-10-01 NOTE — TOC Transition Note (Signed)
Transition of Care (TOC) - CM/SW Discharge Note ? ? ?Patient Details  ?Name: Manuel [name] ?MRN: [id number] ?Date of Birth: 12-28-1945 ? ?Transition of Care (TOC) CM/SW Contact:  ?Kerin Salen, RN ?Phone Number: ?10/01/2021, 2:56 PM ? ? ? ?Final next level of care: Home/Self Care ?Barriers to Discharge: Barriers Resolved ? ? ?Patient Goals and CMS Choice ?  ?  ?  ? ?Discharge Placement ?  ?           ?  ?  ?Name of family member notified: Montine Circle, Son ?Patient and family notified of of transfer: 10/01/21 ? ?Discharge Plan and Services ?  ?  ?           ?DME Arranged: Nebulizer machine ?DME Agency: [employer] ?Date DME Agency Contacted: 10/01/21 ?Time DME Agency Contacted: [phone] ?Representative spoke with at DME Agency: [place] ?HH Arranged: NA ?[place] Agency: NA ?  ?  ?  ? ?Social Determinants of Health (SDOH) Interventions ?  ? ? ?Readmission Risk Interventions ?No flowsheet data found. ? ? ? ? ?

## 2021-10-01 NOTE — TOC Initial Note (Signed)
Transition of Care (TOC) - Initial/Assessment Note  ? ? ?Patient Details  ?Name: Manuel [name] ?MRN: [id number] ?Date of Birth: 24-Aug-1945 ? ?Transition of Care (TOC) CM/SW Contact:    ?Kerin Salen, RN ?Phone Number: ?10/01/2021, 12:59 PM ? ?Clinical Narrative:  Patient to discharge today with DME order for Nebulizer. Patient says he currently use a C-Pap at night and doctor showed him how to use nebulizer, not sure which company supplies him with C-Pap supplies, [place] called,on call service Hassan Rowan will forward request to on call technician.               ? ? ?  ?Barriers to Discharge: Continued Medical Work up ? ? ?Patient Goals and CMS Choice ?  ?  ?  ? ?Expected Discharge Plan and Services ?  ?  ?  ?  ?  ?Expected Discharge Date: 10/01/21               ?  ?  ?  ?  ?  ?  ?  ?  ?  ?  ? ?Prior Living Arrangements/Services ?  ?  ?  ?       ?  ?  ?  ?  ? ?Activities of Daily Living ?  ?  ? ?Permission Sought/Granted ?  ?  ?   ?   ?   ?   ? ?Emotional Assessment ?  ?  ?  ?  ?  ?  ? ?Admission diagnosis:  Acute respiratory failure with hypoxia and hypercapnia (HCC) [J96.01, J96.02] ?Respiratory failure with hypercapnia, unspecified chronicity ([place]) [J96.92] ?Patient Active Problem List  ? Diagnosis Date Noted  ? Acute encephalopathy 09/28/2021  ? AKI (acute kidney injury) ([place]) 09/28/2021  ? Acute on chronic respiratory failure with hypoxia and hypercapnia ([place]) 09/28/2021  ? Aspiration pneumonia ([place]) 09/28/2021  ? Dyspnea on exertion 08/22/2021  ? Cor pulmonale, chronic ([place]) 08/18/2021  ? Hypercholesteremia 09/19/2018  ? Colon cancer ([place]) 05/29/2018  ? Vitamin B12 deficiency 12/20/2017  ? Hypoxia   ? Atrial fibrillation ([place]) 12/12/2017  ? BPH (benign prostatic hyperplasia) 12/12/2017  ? DM (diabetes mellitus) ([place]) 12/12/2017  ? Primary hypertension 12/12/2017  ? OSA (obstructive sleep apnea) 12/12/2017  ? Constipation   ? ?PCP:  [name], DO ?Pharmacy:   ?[id number] [place], [place]  - [address] ?[address]?Phone: [phone]: [phone]? ? ? ? ?Social Determinants of Health (SDOH) Interventions ?  ? ?Readmission Risk Interventions ?No flowsheet data found. ? ? ?

## 2021-10-02 ENCOUNTER — Telehealth: Payer: Self-pay

## 2021-10-02 DIAGNOSIS — N179 Acute kidney failure, unspecified: Secondary | ICD-10-CM | POA: Diagnosis not present

## 2021-10-02 DIAGNOSIS — Z933 Colostomy status: Secondary | ICD-10-CM | POA: Diagnosis not present

## 2021-10-02 DIAGNOSIS — G9340 Encephalopathy, unspecified: Secondary | ICD-10-CM | POA: Diagnosis not present

## 2021-10-02 NOTE — Telephone Encounter (Signed)
Spoke with patient's wife Hassan Rowan and scheduled a FaceTime Palliative Consult for 10/04/21 @ 3 PM. ? ?Consent obtained; updated Outlook/Netsmart/Team List and Epic.  ? ? ?

## 2021-10-02 NOTE — Telephone Encounter (Signed)
Attempted to contact patient's wife Hassan Rowan to schedule a Palliative Care consult appointment. No answer left a message to return call on both home and mobile numbers.  ? ?

## 2021-10-03 ENCOUNTER — Telehealth: Payer: Self-pay | Admitting: *Deleted

## 2021-10-03 NOTE — Telephone Encounter (Signed)
[facility] Sherrill J. Clydene Laming, [place], [place], [place] [phone] ?RNCM spoke with pt daughter-in-law regarding discharge planning for [facility]. Offered medicare.gov list of home health agencies to choose from.  Pt chose [facility] to render services. Sharmon Revere [facility] notified. Patient made aware that [facility] will be in contact in 24-48 hours.  No DME needs identified at this time. ? ?

## 2021-10-04 ENCOUNTER — Other Ambulatory Visit: Payer: Self-pay

## 2021-10-04 ENCOUNTER — Encounter: Payer: Self-pay | Admitting: Family Medicine

## 2021-10-04 ENCOUNTER — Other Ambulatory Visit: Payer: Medicare HMO | Admitting: Family Medicine

## 2021-10-04 DIAGNOSIS — K7460 Unspecified cirrhosis of liver: Secondary | ICD-10-CM

## 2021-10-04 DIAGNOSIS — J449 Chronic obstructive pulmonary disease, unspecified: Secondary | ICD-10-CM

## 2021-10-04 DIAGNOSIS — G4733 Obstructive sleep apnea (adult) (pediatric): Secondary | ICD-10-CM | POA: Diagnosis not present

## 2021-10-04 DIAGNOSIS — Z515 Encounter for palliative care: Secondary | ICD-10-CM | POA: Insufficient documentation

## 2021-10-04 DIAGNOSIS — J690 Pneumonitis due to inhalation of food and vomit: Secondary | ICD-10-CM

## 2021-10-04 DIAGNOSIS — R188 Other ascites: Secondary | ICD-10-CM

## 2021-10-04 HISTORY — DX: Chronic obstructive pulmonary disease, unspecified: J44.9

## 2021-10-04 HISTORY — DX: Unspecified cirrhosis of liver: K74.60

## 2021-10-04 NOTE — Progress Notes (Signed)
? ? ?Manufacturing engineer ?Community Palliative Care Consult Note ?Telephone: [phone]  ?Fax: [phone]  ? ?Date of encounter: 10/04/21 ?3:12 PM ?PATIENT NAME: Manuel [name] ?[place] [address]   ?[phone] (home)  ?DOB: 1945-09-02 ?MRN: [id number] ?PRIMARY CARE PROVIDER:    ?Manuel Morning, DO,  ?[address] ?[place] [address] ?[phone] ? ?REFERRING PROVIDER:   ?Manuel Morning, DO ?[address] ?[address] ?[place],  [place] [address] ?[phone] ? ?RESPONSIBLE PARTY:    ?Contact Information   ? ? Name Relation Home Work Mobile  ? Manuel [name] Spouse [phone]  [phone]  ? Manuel [name] Son [phone]    ? ?  ? ?I connected with  Manuel [name] and spoke with his wife on 10/04/21 by a telemedicine by phone link as we were unable to establish video connectivity and verified that I am speaking with the correct person using two identifiers. ?  ?I discussed the limitations of evaluation and management by telemedicine. The patient expressed understanding and agreed to proceed.  ? ? Palliative Care was asked to follow this patient by consultation request of  Manuel Morning, DO to address advance care planning and complex medical decision making. This is the initial visit.  ? ? ?      ASSESSMENT, SYMPTOM MANAGEMENT AND PLAN / RECOMMENDATIONS:  ? Palliative Care Encounter ?Discussed health care decision maker and does not know if he has legal designation or not, wife is decision maker if he is unable. ?Currently full code, discussed options for completion of MOST, will continue to follow. ? ? Chronic Respiratory Failure, COPD with Hypoxia, Aspiration pneumonia RLL ?Noted paraseptal emphysema on chest CT with scarring, had bilateral pleural effusions and noted debris in RLL consistent with aspiration. ?Recommend O2 titrated to keep O2 sats > 90%, increase to 3L, check nighttime pulse Ox. Noted hypercarbia with CO2 retention. ?Continue Cefdinir  300 mg BID and Azithromycin 500 mg daily through Friday 10/06/21. ? ? Cor Pulmonale with pulmonary hypertension/bilateral pleural effusion ?07/05/21 EF 60-65%, increased RA and RV size, severe TR with RVSP 35 mm HG, increased RAP, dilated IVC with bowing of interatrial septum from right to left. ?Question if pleural effusion from cor pulmonale. ?Continue Lasix 40 mg daily, would recommend daily weights. ?Recommend home health for teaching of disease process, medication oversight and teaching and tracking of symptoms. ?Continue Cardizem CD 240 mg and Lisinopril 10 mg daily. ? ? Cirrhosis of liver with ascites ?LFTs normal except elevated total bilirubin of 1.3 on 09/28/21 ?CT again demonstrating cirrhosis with increased small-volume upper abdominal ascites. ?CBC unremarkable. No hx in past 3 years of elevated ferritin. ?Recommend GI referral, INR, ammonia level and Hepatitis testing. ? ? Lymphadenopathy of uncertain significance ?Per CT 09/28/21 Mildly enlarged lymph nodes are redemonstrated up to 1.2 cm in short axis with few similar mildly prominent bilateral hilar nodes unchanged from 2 months ago. ?SPEP 05/17/21 normal. ?Would follow with PET-CT or three-month follow-up CT as recommended per radiologist. ? ?Follow up Palliative Care Visit: Palliative care will continue to follow for complex medical decision making, advance care planning, and clarification of goals. Return 2 weeks or prn. ? ? ? ?This visit was coded based on medical decision making (MDM). ? ?PPS: 50% ? ?HOSPICE ELIGIBILITY/DIAGNOSIS: TBD ? ? 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 Continues to have increased dyspnea but has improved.  On O2 @ 2.5 L through [employer] with O2 sats  88-90%.  Appetite is good.  Denies CP, dysuria, nausea/vomiting or constipation. Bathes and dresses self.  O2 sats were so severely decreased when admitted pt required intubation and ventilator support.  ? ?CT chest from 09/28/21:  Severe cardiomegaly, small pericardial ?effusion. Distended central pulmonary ?veins, slightly prominent pulmonary trunk 3.3 cm indicating arterial ?hypertension but unchanged. ?Lungs/Pleura: Moderate-sized layering right pleural effusion is ?increased, minimal layering left pleural effusion developed. ?Compressive collapse/consolidation of the entire right ?lower lobe, with the right lower lobe main bronchus filled with ?fluid and debris possibly aspirated material. ?  ?Centrilobular and paraseptal emphysematous changes in the ?right-greater-than-left upper lobes are again shown scattered linear ?scar-like opacities. 6 mm chronic right middle lobe nodule series 4 ?axial 116, stable and presumed benign. There previously was a 4 mm ?pleural-based left lower lobe nodule which is no longer seen. ?  ?Bullous disease is again noted in the base of the right ?middle lobe with additional linear scar-like opacity. There is mild ?dependent atelectasis in the left lung. ?  ?Left-sided central airways are clear.  There is no pneumothorax. ?  ?Upper Abdomen: Cirrhotic liver again noted, increased small-volume ?upper abdominal ascites. No splenomegaly.  ? ?Uses O2 up until bedtime when he wears CPAP with no bleed in of O2 through the night. ? ? ?History obtained from review of EMR, discussion with primary team, and interview with family, facility staff/caregiver anlkm/or Mr. Dark.  ?I reviewed available labs, medications, imaging, studies and related documents from the EMR.  Records reviewed and summarized above.  ? ?ROS ?General: NAD ?EYES: denies vision changes ?ENMT: denies dysphagia, coughing or choking after eating or drinking ?Cardiovascular: denies chest pain, endorses DOE ?Pulmonary: slight  cough, endorses increased SOB but improving dyspnea ?Abdomen: endorses good appetite, denies constipation, endorses continence of bowel ?GU: denies dysuria, endorses continence of urine ?MSK:  denies increased weakness, no falls reported ?Skin: denies rashes or wounds ?Neurological: denies pain, denies insomnia ?Psych: Endorses positive mood ?Heme/lymph/immuno: denies bruises, abnormal bleeding ? ?Physical Exam: ?Current and past weights: lost weight 10 lbs.  ?Unable to examine with lack of video use. ? ?CURRENT PROBLEM LIST:  ?Patient Active Problem List  ? Diagnosis Date Noted  ? Acute encephalopathy 09/28/2021  ? AKI (acute kidney injury) ([place]) 09/28/2021  ? Acute on chronic respiratory failure with hypoxia and hypercapnia ([place]) 09/28/2021  ? Aspiration pneumonia ([place]) 09/28/2021  ? Dyspnea on exertion 08/22/2021  ? Cor pulmonale, chronic ([place]) 08/18/2021  ? Hypercholesteremia 09/19/2018  ? Colon cancer ([place]) 05/29/2018  ? Vitamin B12 deficiency 12/20/2017  ? Hypoxia   ? Atrial fibrillation ([place]) 12/12/2017  ? BPH (benign prostatic hyperplasia) 12/12/2017  ? DM (diabetes mellitus) ([place]) 12/12/2017  ? Primary hypertension 12/12/2017  ? OSA (obstructive sleep apnea) 12/12/2017  ? Constipation   ? ?PAST MEDICAL HISTORY:  ?Active Ambulatory Problems  ?  Diagnosis Date Noted  ? Atrial fibrillation ([place]) 12/12/2017  ? BPH (benign prostatic hyperplasia) 12/12/2017  ? DM (diabetes mellitus) ([place]) 12/12/2017  ? Primary hypertension 12/12/2017  ? OSA (obstructive sleep apnea) 12/12/2017  ? Constipation   ? Hypoxia   ? Vitamin B12 deficiency 12/20/2017  ? Colon cancer ([place]) 05/29/2018  ? Hypercholesteremia 09/19/2018  ? Cor pulmonale, chronic ([place]) 08/18/2021  ? Dyspnea on exertion 08/22/2021  ? Acute encephalopathy 09/28/2021  ? AKI (acute kidney  injury) ([place]) 09/28/2021  ? Acute on chronic respiratory failure with hypoxia and hypercapnia ([place]) 09/28/2021  ? Aspiration pneumonia ([place]) 09/28/2021  ? ?Resolved Ambulatory  Problems  ?  Diagnosis Date Noted  ? Left groin pain 04/10/2011  ? Abdominal distension   ? Colonic stricture ([place]) 12/13/2017  ? Acute posthemorrhagic anemia 12/19/2017  ? Acute GI bleeding 12/19/2017  ? Low o

## 2021-10-09 DIAGNOSIS — E785 Hyperlipidemia, unspecified: Secondary | ICD-10-CM | POA: Diagnosis not present

## 2021-10-09 DIAGNOSIS — R809 Proteinuria, unspecified: Secondary | ICD-10-CM | POA: Diagnosis not present

## 2021-10-09 DIAGNOSIS — D72819 Decreased white blood cell count, unspecified: Secondary | ICD-10-CM | POA: Diagnosis not present

## 2021-10-09 DIAGNOSIS — B370 Candidal stomatitis: Secondary | ICD-10-CM | POA: Diagnosis not present

## 2021-10-09 DIAGNOSIS — I10 Essential (primary) hypertension: Secondary | ICD-10-CM | POA: Diagnosis not present

## 2021-10-09 DIAGNOSIS — I480 Paroxysmal atrial fibrillation: Secondary | ICD-10-CM | POA: Diagnosis not present

## 2021-10-09 DIAGNOSIS — Z6827 Body mass index (BMI) 27.0-27.9, adult: Secondary | ICD-10-CM | POA: Diagnosis not present

## 2021-10-09 DIAGNOSIS — E1121 Type 2 diabetes mellitus with diabetic nephropathy: Secondary | ICD-10-CM | POA: Diagnosis not present

## 2021-10-10 ENCOUNTER — Telehealth: Payer: Self-pay

## 2021-10-10 NOTE — Telephone Encounter (Signed)
Palliative care follow-up visit scheduled for 10/19/21 with Santiago Glad NP ?

## 2021-10-12 DIAGNOSIS — K4090 Unilateral inguinal hernia, without obstruction or gangrene, not specified as recurrent: Secondary | ICD-10-CM | POA: Diagnosis not present

## 2021-10-12 DIAGNOSIS — I2781 Cor pulmonale (chronic): Secondary | ICD-10-CM | POA: Diagnosis not present

## 2021-10-12 DIAGNOSIS — R0902 Hypoxemia: Secondary | ICD-10-CM | POA: Diagnosis not present

## 2021-10-18 DIAGNOSIS — E1129 Type 2 diabetes mellitus with other diabetic kidney complication: Secondary | ICD-10-CM | POA: Diagnosis not present

## 2021-10-18 DIAGNOSIS — N182 Chronic kidney disease, stage 2 (mild): Secondary | ICD-10-CM | POA: Diagnosis not present

## 2021-10-18 DIAGNOSIS — E1122 Type 2 diabetes mellitus with diabetic chronic kidney disease: Secondary | ICD-10-CM | POA: Diagnosis not present

## 2021-10-18 DIAGNOSIS — I129 Hypertensive chronic kidney disease with stage 1 through stage 4 chronic kidney disease, or unspecified chronic kidney disease: Secondary | ICD-10-CM | POA: Diagnosis not present

## 2021-10-18 DIAGNOSIS — N2581 Secondary hyperparathyroidism of renal origin: Secondary | ICD-10-CM | POA: Diagnosis not present

## 2021-10-18 DIAGNOSIS — E785 Hyperlipidemia, unspecified: Secondary | ICD-10-CM | POA: Diagnosis not present

## 2021-10-18 DIAGNOSIS — D529 Folate deficiency anemia, unspecified: Secondary | ICD-10-CM | POA: Diagnosis not present

## 2021-10-18 DIAGNOSIS — N400 Enlarged prostate without lower urinary tract symptoms: Secondary | ICD-10-CM | POA: Diagnosis not present

## 2021-10-18 DIAGNOSIS — N189 Chronic kidney disease, unspecified: Secondary | ICD-10-CM | POA: Diagnosis not present

## 2021-10-18 DIAGNOSIS — R809 Proteinuria, unspecified: Secondary | ICD-10-CM | POA: Diagnosis not present

## 2021-10-18 DIAGNOSIS — D519 Vitamin B12 deficiency anemia, unspecified: Secondary | ICD-10-CM | POA: Diagnosis not present

## 2021-10-18 DIAGNOSIS — D631 Anemia in chronic kidney disease: Secondary | ICD-10-CM | POA: Diagnosis not present

## 2021-10-19 ENCOUNTER — Other Ambulatory Visit: Payer: Medicare HMO | Admitting: Family Medicine

## 2021-10-20 ENCOUNTER — Ambulatory Visit: Payer: Medicare HMO | Admitting: Pulmonary Disease

## 2021-10-20 ENCOUNTER — Encounter: Payer: Self-pay | Admitting: Pulmonary Disease

## 2021-10-20 VITALS — BP 122/68 | HR 72 | Temp 98.6°F | Ht 75.0 in | Wt 191.2 lb

## 2021-10-20 DIAGNOSIS — R0902 Hypoxemia: Secondary | ICD-10-CM

## 2021-10-20 DIAGNOSIS — J690 Pneumonitis due to inhalation of food and vomit: Secondary | ICD-10-CM | POA: Diagnosis not present

## 2021-10-20 NOTE — Patient Instructions (Signed)
Neck to see you again ? ?I am glad you are doing better ? ?Continue to check your oxygen at home, make sure it stays 88% or higher. ? ?Continue to check especially with activity ? ?No changes to medications ? ?I think would be a good idea to repeat an echocardiogram or heart ultrasound in a few months, I will arrange this after discussing with your heart doctor ? ?Return to clinic in 3 months or sooner if needed with Dr. Silas Flood ?

## 2021-10-20 NOTE — Progress Notes (Signed)
? ?_0  ID: Manuel [name], male    DOB: 04/06/1946, 76 y.o.   MRN: [id number] ? ?Chief Complaint  ?Patient presents with  ? Hospitalization Follow-up  ?  Hospital follow up. Pt states he is trying to adjust. He states his energy level is still low. Pt states his breathing is doing ok.   ? ? ?Referring provider: ?[name], DO ? ?HPI:  ? ?76 y.o. man whom we are seeing in hospital follow up for respiratory failure.  Recent discharge summary reviewed. ? ?Patient was recently admitted to the hospital and discharged 10/01/2021.  Presented with altered mental status, encephalopathy.  Found to be hypercapnic.  Intubated.  CT chest demonstrated right lower lobe pneumonia with debris in the airway as well as small right-sided pleural effusion on my review and interpretation.  Felt to be related to aspiration.  He was treated with antibiotics.  He was extubated after a day or so.  Remained on the floor for a few days.  He was discharged and noted not to be hypoxemic.  Overall feels improved.  Gradually feeling better.  Feels fatigued, tired.  That slowly getting better as well.  Discussed at length likely related to hospitalization, deconditioning, will take time to get better after recent admission.  Discussed likely will take a period of weeks. ? ?HPI at initial visit: ?Patient feels well today.  He denies any significant dyspnea.  No shortness of breath.  Relatively asymptomatic.  History largely gathered via EMR.  He was seen by cardiology 11-15/22.  At that point time he was complaining of marked dyspnea on exertion over the last 4 to 6 weeks.  Also with orthopnea.  Increased lower extremity swelling.  Still smoking at that time.  The note from that cardiology visit states his oxygen saturation was low at that visit as well at the time of his PCP visit about a week prior.  Is placed on Lasix.  Current dose 40 mg twice a day.  Was seen 06/20/2021 by cardiology with appears to be improvement in exam.  Was to  continue Lasix 40 mg twice daily.  He says he is doing that.  He had a chest x-ray 06/09/2021 reviewed interpreted as small to moderate bilateral loculated pleural effusions, increased interstitial markings may represent edema in the setting of bilateral effusions.  He reports about a 15-pack-year smoking history.  Quit a few weeks ago.  Diet he denies any recurrent or repeated pneumonias as a child or adult.  No frequent bouts of bronchitis as a child or a kid.  He does endorse frequent atopic symptoms, seasonal allergies.  Does not use any medicine just powers through.  Had hayfever a lot as a child. ? ? ?On intake, O2 sat low 90s on room air but desaturated to 77% with talking.  Repeat O2 sat with forehead probe 84 to 85%.  He was placed on 1 L nasal cannula with oxygen saturation 94%.  He was ambulated around the clinic with desaturation to the mid 80s.  2 L nasal cannula via POC maintain oxygen saturations around 94%.  New prescription for oxygen today.  Discussed at length that risks and benefits of oxygen and refusing oxygen.  He does not seem thrilled with the idea of using oxygen, he seemed to indicate he would likely not use it.  Again I counseled on the importance of using oxygen and the risk of not using oxygen at home. ? 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 ? ? ? ?Questionaires / Pulmonary Flowsheets:  ? ?ACT:  ?   ? View : No data to display.  ?  ?  ?  ? ? ?MMRC: ?   ? View : No data to display.  ?  ?  ?  ? ? ?Epworth:  ?   ? View : No data to display.  ?  ?  ?  ? ? ?Tests:  ? ?FENO:  ?No results found for: NITRICOXIDE ? ?PFT: ? ?  Latest Ref Rng & Units 08/21/2021  ?   9:34 AM  ?PFT Results  ?FVC-Pre L 2.36    ?FVC-Predicted Pre % 52    ?FVC-Post L 2.20    ?FVC-Predicted Post % 48    ?Pre FEV1/FVC % % 68    ?Post FEV1/FCV % % 73    ?FEV1-Pre L 1.59    ?FEV1-Predicted Pre % 46    ?FEV1-Post L 1.61    ?DLCO uncorrected ml/min/mmHg 15.16    ?DLCO UNC% % 52    ?DLCO corrected ml/min/mmHg 14.76    ?DLCO COR %Predicted % 50    ?DLVA Predicted % 94    ?TLC L 4.66    ?TLC % Predicted % 57    ?RV % Predicted % 86    ? ?WALK:  ?   ? View : No data to display.  ?  ?  ?  ? ? ?Imaging: ?CT Head Wo Contrast ? ?Result Date: 09/28/2021 ?CLINICAL DATA:  Mental status change, unknown cause EXAM: CT HEAD WITHOUT CONTRAST TECHNIQUE: Contiguous axial images were obtained from the base of the skull through the vertex without intravenous contrast. RADIATION DOSE REDUCTION: This exam was performed according to the departmental dose-optimization program which includes automated exposure control, adjustment of the mA and/or kV according to patient size and/or use of iterative reconstruction technique. COMPARISON:  None. FINDINGS: Brain: There is no acute intracranial hemorrhage, mass effect, or edema. Gray-white differentiation is preserved. There is no extra-axial fluid collection. Ventricles and sulci are within normal limits in size and configuration. Vascular: No hyperdense vessel or unexpected calcification. Skull: Calvarium is unremarkable. Sinuses/Orbits: No acute finding.  Right lens replacement. Other: None. IMPRESSION: No acute intracranial hemorrhage or evidence of acute infarction. Electronically Signed   By: Macy Mis M.D.   On: 09/28/2021 15:45  ? ?CT CHEST WO CONTRAST ? ?Result Date: 09/28/2021 ?CLINICAL DATA:  Hypoxia and ventilator dependent respiratory failure. EXAM: CT CHEST WITHOUT CONTRAST TECHNIQUE: Multidetector CT imaging of the chest was performed following the standard protocol without IV contrast. RADIATION DOSE REDUCTION: This exam was performed according to the departmental  dose-optimization program which includes automated exposure control, adjustment of the mA and/or kV according to patient size and/or use of iterative reconstruction technique. COMPARISON:  Chest CT without contrast 07/27/2021, CT abdomen pelvis with contrast 01/02/2018, and 2 portable chest films from today, PA Lat chest from 06/09/2021. FINDINGS: Support tubes: ETT tip is 6.5 cm from the carina, at the level of the heads of the clavicles. NGT is in place. The proximal side-hole is at the esophageal hiatus and should be advanced further in 8-10 cm for optimal placement. Cardiovascular: There's severe cardiomegaly. Small pericardial effusion is again noted. There are distended central pulmonary veins, slightly prominent pulmonary trunk 3.3 cm  indicating arterial hypertension but unchanged. The venous distention is also similar at least to 07/27/2021. Minimal scattered aortic atherosclerosis. There is aortic tortuosity without aneurysm. Normal great vessel branching. Mediastinum/Nodes: No thyroid or axillary mass. Mildly enlarged mediastinal lymph nodes are redemonstrated up to 1.2 cm in short axis with few similar mildly prominent bilateral hilar nodes. No tracheal mass is seen. There is mild fluid in the upper thoracic esophagus without wall thickening. Lungs/Pleura: Moderate-sized layering right pleural effusion is increased, minimal layering left pleural effusion developed. There is compressive collapse/consolidation of the entire right lower lobe, with the right lower lobe main bronchus filled with fluid and debris possibly aspirated material. Centrilobular and paraseptal emphysematous changes in the right-greater-than-left upper lobes are again shown scattered linear scar-like opacities. 6 mm chronic right middle lobe nodule series 4 axial 116, stable and presumed benign. There previously was a 4 mm pleural-based left lower lobe nodule which is no longer seen. Bullous disease is again demonstrated in the base of  the right middle lobe with additional linear scar-like opacity. There is mild dependent atelectasis in the left lung. Left-sided central airways are clear.  There is no pneumothorax. Upper Abdomen: Cirrhoti

## 2021-10-29 DIAGNOSIS — J9601 Acute respiratory failure with hypoxia: Secondary | ICD-10-CM | POA: Diagnosis not present

## 2021-10-31 ENCOUNTER — Other Ambulatory Visit: Payer: Medicare HMO | Admitting: Family Medicine

## 2021-10-31 DIAGNOSIS — G4733 Obstructive sleep apnea (adult) (pediatric): Secondary | ICD-10-CM | POA: Diagnosis not present

## 2021-11-01 ENCOUNTER — Other Ambulatory Visit: Payer: Medicare HMO | Admitting: Family Medicine

## 2021-11-01 VITALS — BP 96/54 | HR 82 | Resp 20

## 2021-11-01 DIAGNOSIS — G4733 Obstructive sleep apnea (adult) (pediatric): Secondary | ICD-10-CM

## 2021-11-01 DIAGNOSIS — J9621 Acute and chronic respiratory failure with hypoxia: Secondary | ICD-10-CM | POA: Diagnosis not present

## 2021-11-01 DIAGNOSIS — J9622 Acute and chronic respiratory failure with hypercapnia: Secondary | ICD-10-CM | POA: Diagnosis not present

## 2021-11-01 DIAGNOSIS — Z515 Encounter for palliative care: Secondary | ICD-10-CM | POA: Diagnosis not present

## 2021-11-01 NOTE — Progress Notes (Signed)
? ? ?Manufacturing engineer ?Community Palliative Care Consult Note ?Telephone: [phone]  ?Fax: [phone]  ? ? ?Date of encounter: 11/01/21 ?11:05 AM ?PATIENT NAME: Manuel [name] ?[place] [address]   ?[phone] (home)  ?DOB: November 05, 1945 ?MRN: [id number] ?PRIMARY CARE PROVIDER:    ?Manuel Morning, DO,  ?[address] ?[place] [address] ?[phone] ? ?REFERRING PROVIDER:   ?Manuel Morning, DO ?[address] ?[address] ?[place],  [place] [address] ?[phone] ? ?RESPONSIBLE PARTY:    ?Contact Information   ? ? Name Relation Home Work Mobile  ? Manuel, [name] Spouse [phone]  [phone]  ? Braley,derrick [name] [phone]    ? ?  ? ? ? ?I met face to face with patient and wife in their home. Palliative Care was asked to follow this patient by consultation request of  Manuel Morning, DO to address advance care planning and complex medical decision making. This is a follow up visit. ? ?ASSESSMENT, SYMPTOM MANAGEMENT AND PLAN / RECOMMENDATIONS:  ? Acute on Chronic Respiratory Failure with Hypoxia ?Secondary to cor pulmonale, COPD and recent aspiration pneumonia. ?Encourage compliance with O2 @ 2L [place] to maintain sats > 90% ?Likely fatigue relative to hypoxia. ? ? Palliative Care Encounter ?Encourage pt and wife to check O2 sats immediately on awakening for several days to see if pt is hypoxic. ?NP will follow up in 2-3 weeks with return visit. ? ? OSA ?Continue compliance with CPAP ?May need supplemental O2 bled in at night to CPAP ? ?Advance Care Planning/Goals of Care:  ?CODE STATUS: ?Currently full code, plan to address goals of care on next contact ? ? ? ?Follow up Palliative Care Visit: Palliative care will continue to follow for complex medical decision making, advance care planning, and clarification of goals. Return 2-3 weeks or prn. ? ? ?This visit was coded based on medical decision making (MDM). ? ?PPS: 70% ? ?HOSPICE ELIGIBILITY/DIAGNOSIS:  TBD ? ?Chief Complaint:  ?Palliative Care is following pt for chronic medical management of chronic hypoxic respiratory failure and to address goals of care.  Patient c/o fatigue on awakening despite compliance with CPAP. ? ?HISTORY OF PRESENT ILLNESS:  Manuel [name] is a 76 y.o. year old male with acute on chronic respiratory failure due to aspiration pneumonia, COPD and cor pulmonale. He also has HTN, OSA on CPAP, permanent atrial fibrillation, AAA without rupture, cirrhosis of the liver, hx of SBO and GI bleed due to duodenal ulcer, hx of colon cancer, DM and secondary hyperparathyroidism, congenital absence of left kidney and CKD with anemia, BPH, colostomy present and HLD, and Vitamin B12 deficiency. ?Energy took a nosedive the last 6 months.  Says he has been waking up fatigued and not wanting to get up despite using CPAP as prescribed nightly.  Has had recent titration study. Did not want to use Oxygen and has resisted. Was wearing O2 on my arrival and took it off.  Room air O2 sat after 30 minutes was 84%.  Wife and daughter-in-law give medicine but he is capable of taking himself.  Completed antibiotics prescribed for aspiration pneumonia after d/c.  Denies coughing/choking after eating or drinking. Denies fever, worsening SOB or cough. [facility]  supplies O2 and CPAP. Lost 15-20 lbs in 6 months.  Has not been eating good, stating he is g"etting full quickly.  Has a right inguinal hernia that needs repair but they are waiting for me to get stronger".   Blood sugars are good, HGB A1c was 6.6% on 09/29/21.  No low blood sugars.  Able to bathe and dress self. Has some intermittent lightheadedness and occasional nausea.  No falls. 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 Manuel [name] is PCP. ? ?History obtained from review of EMR, discussion with primary team, and interview with family, facility staff/caregiver and/or Manuel [name].  ?I reviewed available labs, medications, imaging, studies and related  documents from the EMR.  Records reviewed and summarized above.  ? ?ROS ? ?General: NAD ?EYES: denies vision changes ?ENMT: denies dysphagia ?Cardiovascular: denies chest pain, denies DOE ?Pulmonary: denies cough, denies increased SOB ?Abdomen: endorses fair appetite, has colostomy ?GU: denies dysuria, endorses continence of urine ?MSK:  denies increased weakness, no falls reported ?Skin: denies rashes or wounds ?Neurological: denies pain, denies insomnia ?Psych: Endorses positive mood ?Heme/lymph/immuno: denies bruises, abnormal bleeding ? ?Physical Exam: ?Current and past weights: 191 lbs 3.2 ounces as of 10/20/21 ?Constitutional: NAD ?General: thin, WNWD ?EYES: anicteric sclera, lids intact, no discharge  ?ENMT: intact hearing, oral mucous membranes moist, dentition intact ?CV: S1S2, IRIR rate controlled, no LE edema ?Pulmonary: CTAB, no increased work of breathing, no cough, room air sat 84% comes up to 90s after 5-10 min on O2 @ 2L ?Abdomen: normo-active BS + 4 quadrants, soft and non tender, no ascites ?GU: deferred ?MSK: no sarcopenia, moves all extremities, ambulatory ?Skin: warm and dry, no rashes or wounds on visible skin ?Neuro:  no generalized weakness,  no cognitive impairment ?Psych: non-anxious affect, A and O x 3 ?Hem/lymph/immuno: no widespread bruising ? ? ?Thank you for the opportunity to participate in the care of Manuel [name].  The palliative care team will continue to follow. Please call our office at [phone] if we can be of additional assistance.  ? ?Marijo Conception, FNP  ? ?COVID-19 PATIENT SCREENING TOOL ?Asked and negative response unless otherwise noted:  ? ?Have you had symptoms of covid, tested positive or been in contact with someone with symptoms/positive test in the past 5-10 days? No ? ?

## 2021-11-03 ENCOUNTER — Encounter: Payer: Self-pay | Admitting: Family Medicine

## 2021-11-10 ENCOUNTER — Emergency Department (HOSPITAL_COMMUNITY): Payer: Medicare HMO

## 2021-11-10 ENCOUNTER — Other Ambulatory Visit: Payer: Medicare HMO | Admitting: Family Medicine

## 2021-11-10 ENCOUNTER — Inpatient Hospital Stay (HOSPITAL_COMMUNITY)
Admission: EM | Admit: 2021-11-10 | Discharge: 2021-11-14 | DRG: 193 | Disposition: A | Discharge status: Home / Self Care | Payer: Medicare HMO | Attending: Internal Medicine | Admitting: Internal Medicine

## 2021-11-10 ENCOUNTER — Encounter (HOSPITAL_COMMUNITY): Payer: Self-pay

## 2021-11-10 ENCOUNTER — Encounter: Payer: Self-pay | Admitting: Family Medicine

## 2021-11-10 VITALS — BP 104/60 | HR 69 | Resp 18 | Wt 178.0 lb

## 2021-11-10 DIAGNOSIS — J440 Chronic obstructive pulmonary disease with acute lower respiratory infection: Secondary | ICD-10-CM | POA: Diagnosis present

## 2021-11-10 DIAGNOSIS — Z833 Family history of diabetes mellitus: Secondary | ICD-10-CM | POA: Diagnosis not present

## 2021-11-10 DIAGNOSIS — J441 Chronic obstructive pulmonary disease with (acute) exacerbation: Secondary | ICD-10-CM | POA: Diagnosis present

## 2021-11-10 DIAGNOSIS — Z9049 Acquired absence of other specified parts of digestive tract: Secondary | ICD-10-CM | POA: Diagnosis not present

## 2021-11-10 DIAGNOSIS — Z9981 Dependence on supplemental oxygen: Secondary | ICD-10-CM

## 2021-11-10 DIAGNOSIS — E1122 Type 2 diabetes mellitus with diabetic chronic kidney disease: Secondary | ICD-10-CM | POA: Diagnosis not present

## 2021-11-10 DIAGNOSIS — N179 Acute kidney failure, unspecified: Secondary | ICD-10-CM | POA: Diagnosis not present

## 2021-11-10 DIAGNOSIS — I10 Essential (primary) hypertension: Secondary | ICD-10-CM | POA: Diagnosis present

## 2021-11-10 DIAGNOSIS — I5031 Acute diastolic (congestive) heart failure: Secondary | ICD-10-CM | POA: Diagnosis not present

## 2021-11-10 DIAGNOSIS — R69 Illness, unspecified: Secondary | ICD-10-CM | POA: Diagnosis not present

## 2021-11-10 DIAGNOSIS — E119 Type 2 diabetes mellitus without complications: Secondary | ICD-10-CM

## 2021-11-10 DIAGNOSIS — J90 Pleural effusion, not elsewhere classified: Secondary | ICD-10-CM | POA: Diagnosis not present

## 2021-11-10 DIAGNOSIS — I5033 Acute on chronic diastolic (congestive) heart failure: Secondary | ICD-10-CM | POA: Diagnosis not present

## 2021-11-10 DIAGNOSIS — Z888 Allergy status to other drugs, medicaments and biological substances status: Secondary | ICD-10-CM

## 2021-11-10 DIAGNOSIS — I714 Abdominal aortic aneurysm, without rupture, unspecified: Secondary | ICD-10-CM | POA: Diagnosis present

## 2021-11-10 DIAGNOSIS — Z87891 Personal history of nicotine dependence: Secondary | ICD-10-CM | POA: Diagnosis not present

## 2021-11-10 DIAGNOSIS — I130 Hypertensive heart and chronic kidney disease with heart failure and stage 1 through stage 4 chronic kidney disease, or unspecified chronic kidney disease: Secondary | ICD-10-CM | POA: Diagnosis present

## 2021-11-10 DIAGNOSIS — Z820 Family history of epilepsy and other diseases of the nervous system: Secondary | ICD-10-CM | POA: Diagnosis not present

## 2021-11-10 DIAGNOSIS — J189 Pneumonia, unspecified organism: Principal | ICD-10-CM

## 2021-11-10 DIAGNOSIS — J9621 Acute and chronic respiratory failure with hypoxia: Secondary | ICD-10-CM | POA: Diagnosis not present

## 2021-11-10 DIAGNOSIS — E538 Deficiency of other specified B group vitamins: Secondary | ICD-10-CM | POA: Diagnosis present

## 2021-11-10 DIAGNOSIS — I083 Combined rheumatic disorders of mitral, aortic and tricuspid valves: Secondary | ICD-10-CM | POA: Diagnosis not present

## 2021-11-10 DIAGNOSIS — R0602 Shortness of breath: Secondary | ICD-10-CM | POA: Diagnosis not present

## 2021-11-10 DIAGNOSIS — Z7984 Long term (current) use of oral hypoglycemic drugs: Secondary | ICD-10-CM

## 2021-11-10 DIAGNOSIS — Z515 Encounter for palliative care: Secondary | ICD-10-CM

## 2021-11-10 DIAGNOSIS — Z85038 Personal history of other malignant neoplasm of large intestine: Secondary | ICD-10-CM

## 2021-11-10 DIAGNOSIS — I4821 Permanent atrial fibrillation: Secondary | ICD-10-CM | POA: Diagnosis not present

## 2021-11-10 DIAGNOSIS — Z8711 Personal history of peptic ulcer disease: Secondary | ICD-10-CM

## 2021-11-10 DIAGNOSIS — N400 Enlarged prostate without lower urinary tract symptoms: Secondary | ICD-10-CM | POA: Diagnosis not present

## 2021-11-10 DIAGNOSIS — I2781 Cor pulmonale (chronic): Secondary | ICD-10-CM

## 2021-11-10 DIAGNOSIS — G4733 Obstructive sleep apnea (adult) (pediatric): Secondary | ICD-10-CM | POA: Diagnosis not present

## 2021-11-10 DIAGNOSIS — E7800 Pure hypercholesterolemia, unspecified: Secondary | ICD-10-CM | POA: Diagnosis present

## 2021-11-10 DIAGNOSIS — Q600 Renal agenesis, unilateral: Secondary | ICD-10-CM

## 2021-11-10 DIAGNOSIS — N182 Chronic kidney disease, stage 2 (mild): Secondary | ICD-10-CM | POA: Diagnosis present

## 2021-11-10 DIAGNOSIS — R0600 Dyspnea, unspecified: Secondary | ICD-10-CM

## 2021-11-10 DIAGNOSIS — J9811 Atelectasis: Secondary | ICD-10-CM | POA: Diagnosis not present

## 2021-11-10 DIAGNOSIS — Z20822 Contact with and (suspected) exposure to covid-19: Secondary | ICD-10-CM | POA: Diagnosis not present

## 2021-11-10 DIAGNOSIS — Z8701 Personal history of pneumonia (recurrent): Secondary | ICD-10-CM

## 2021-11-10 DIAGNOSIS — I482 Chronic atrial fibrillation, unspecified: Secondary | ICD-10-CM | POA: Diagnosis not present

## 2021-11-10 DIAGNOSIS — R091 Pleurisy: Secondary | ICD-10-CM | POA: Diagnosis not present

## 2021-11-10 DIAGNOSIS — Z7901 Long term (current) use of anticoagulants: Secondary | ICD-10-CM

## 2021-11-10 DIAGNOSIS — J9622 Acute and chronic respiratory failure with hypercapnia: Secondary | ICD-10-CM | POA: Diagnosis not present

## 2021-11-10 DIAGNOSIS — R846 Abnormal cytological findings in specimens from respiratory organs and thorax: Secondary | ICD-10-CM | POA: Diagnosis not present

## 2021-11-10 DIAGNOSIS — Z882 Allergy status to sulfonamides status: Secondary | ICD-10-CM

## 2021-11-10 DIAGNOSIS — F321 Major depressive disorder, single episode, moderate: Secondary | ICD-10-CM | POA: Insufficient documentation

## 2021-11-10 DIAGNOSIS — Z79899 Other long term (current) drug therapy: Secondary | ICD-10-CM

## 2021-11-10 LAB — CBC
HCT: 45.9 % (ref 39.0–52.0)
Hemoglobin: 13.4 g/dL (ref 13.0–17.0)
MCH: 30.2 pg (ref 26.0–34.0)
MCHC: 29.2 g/dL — ABNORMAL LOW (ref 30.0–36.0)
MCV: 103.4 fL — ABNORMAL HIGH (ref 80.0–100.0)
Platelets: 171 10*3/uL (ref 150–400)
RBC: 4.44 MIL/uL (ref 4.22–5.81)
RDW: 15.7 % — ABNORMAL HIGH (ref 11.5–15.5)
WBC: 2.8 10*3/uL — ABNORMAL LOW (ref 4.0–10.5)
nRBC: 0 % (ref 0.0–0.2)

## 2021-11-10 LAB — BASIC METABOLIC PANEL
Anion gap: 5 (ref 5–15)
BUN: 22 mg/dL (ref 8–23)
CO2: 38 mmol/L — ABNORMAL HIGH (ref 22–32)
Calcium: 9.2 mg/dL (ref 8.9–10.3)
Chloride: 99 mmol/L (ref 98–111)
Creatinine, Ser: 1.56 mg/dL — ABNORMAL HIGH (ref 0.61–1.24)
GFR, Estimated: 46 mL/min — ABNORMAL LOW (ref >60)
Glucose, Bld: 106 mg/dL — ABNORMAL HIGH (ref 70–99)
Potassium: 4.9 mmol/L (ref 3.5–5.1)
Sodium: 142 mmol/L (ref 135–145)

## 2021-11-10 LAB — BRAIN NATRIURETIC PEPTIDE: B Natriuretic Peptide: 178.8 pg/mL — ABNORMAL HIGH (ref 0.0–100.0)

## 2021-11-10 LAB — CBG MONITORING, ED: Glucose-Capillary: 97 mg/dL (ref 70–99)

## 2021-11-10 LAB — RESP PANEL BY RT-PCR (FLU A&B, COVID) ARPGX2
Influenza A by PCR: NEGATIVE
Influenza B by PCR: NEGATIVE
SARS Coronavirus 2 by RT PCR: NEGATIVE

## 2021-11-10 LAB — TROPONIN I (HIGH SENSITIVITY)
Troponin I (High Sensitivity): 7 ng/L (ref <18)
Troponin I (High Sensitivity): 9 ng/L (ref <18)

## 2021-11-10 MED ORDER — FERROUS SULFATE 325 (65 FE) MG PO TABS
325.0000 mg | ORAL_TABLET | Freq: Every day | ORAL | Status: DC
Start: 2021-11-11 — End: 2021-11-14
  Administered 2021-11-11 – 2021-11-14 (×4): 325 mg via ORAL
  Filled 2021-11-10 (×4): qty 1

## 2021-11-10 MED ORDER — PANTOPRAZOLE SODIUM 40 MG PO TBEC
40.0000 mg | DELAYED_RELEASE_TABLET | Freq: Every day | ORAL | Status: DC
  Administered 2021-11-10 – 2021-11-14 (×5): 40 mg via ORAL
  Filled 2021-11-10 (×5): qty 1

## 2021-11-10 MED ORDER — ONDANSETRON HCL 4 MG/2ML IJ SOLN: 4.0000 mg | Freq: Four times a day (QID) | INTRAMUSCULAR | Status: DC | PRN

## 2021-11-10 MED ORDER — DOXAZOSIN MESYLATE 4 MG PO TABS
4.0000 mg | ORAL_TABLET | Freq: Every day | ORAL | Status: DC
  Administered 2021-11-11 – 2021-11-13 (×4): 4 mg via ORAL
  Filled 2021-11-10 (×4): qty 1

## 2021-11-10 MED ORDER — IPRATROPIUM-ALBUTEROL 0.5-2.5 (3) MG/3ML IN SOLN
3.0000 mL | Freq: Four times a day (QID) | RESPIRATORY_TRACT | Status: DC
  Administered 2021-11-10 – 2021-11-11 (×3): 3 mL via RESPIRATORY_TRACT
  Filled 2021-11-10 (×3): qty 3
  Filled 2021-11-10: qty 6

## 2021-11-10 MED ORDER — ENOXAPARIN SODIUM 40 MG/0.4ML IJ SOSY: 40.0000 mg | PREFILLED_SYRINGE | INTRAMUSCULAR | Status: DC

## 2021-11-10 MED ORDER — APIXABAN 5 MG PO TABS
5.0000 mg | ORAL_TABLET | Freq: Two times a day (BID) | ORAL | Status: DC
  Administered 2021-11-10 – 2021-11-14 (×8): 5 mg via ORAL
  Filled 2021-11-10 (×8): qty 1

## 2021-11-10 MED ORDER — FUROSEMIDE 10 MG/ML IJ SOLN
20.0000 mg | Freq: Once | INTRAMUSCULAR | Status: AC
  Administered 2021-11-10: 20 mg via INTRAVENOUS
  Filled 2021-11-10: qty 2

## 2021-11-10 MED ORDER — VITAMIN D (ERGOCALCIFEROL) 1.25 MG (50000 UNIT) PO CAPS: 50000.0000 [IU] | ORAL_CAPSULE | ORAL | Status: DC

## 2021-11-10 MED ORDER — MAGNESIUM SULFATE 2 GM/50ML IV SOLN
2.0000 g | Freq: Once | INTRAVENOUS | Status: AC
  Administered 2021-11-10: 2 g via INTRAVENOUS
  Filled 2021-11-10: qty 50

## 2021-11-10 MED ORDER — POLYETHYLENE GLYCOL 3350 17 G PO PACK: 17.0000 g | PACK | Freq: Every day | ORAL | Status: DC | PRN

## 2021-11-10 MED ORDER — METHYLPREDNISOLONE SODIUM SUCC 125 MG IJ SOLR
125.0000 mg | Freq: Once | INTRAMUSCULAR | Status: DC
  Administered 2021-11-10: 125 mg via INTRAVENOUS
  Filled 2021-11-10: qty 2

## 2021-11-10 MED ORDER — INSULIN ASPART 100 UNIT/ML IJ SOLN
0.0000 [IU] | Freq: Three times a day (TID) | INTRAMUSCULAR | Status: DC
  Administered 2021-11-11 (×3): 2 [IU] via SUBCUTANEOUS
  Administered 2021-11-12: 3 [IU] via SUBCUTANEOUS
  Administered 2021-11-12: 1 [IU] via SUBCUTANEOUS
  Administered 2021-11-13: 2 [IU] via SUBCUTANEOUS

## 2021-11-10 MED ORDER — SODIUM CHLORIDE 0.9 % IV SOLN
2.0000 g | INTRAVENOUS | Status: DC
  Administered 2021-11-10 – 2021-11-13 (×4): 2 g via INTRAVENOUS
  Filled 2021-11-10 (×5): qty 20

## 2021-11-10 MED ORDER — MELATONIN 3 MG PO TABS
3.0000 mg | ORAL_TABLET | Freq: Every evening | ORAL | Status: DC | PRN
  Administered 2021-11-10: 3 mg via ORAL
  Filled 2021-11-10: qty 1

## 2021-11-10 MED ORDER — METHYLPREDNISOLONE SODIUM SUCC 125 MG IJ SOLR
80.0000 mg | Freq: Every day | INTRAMUSCULAR | Status: DC
  Administered 2021-11-10 – 2021-11-13 (×4): 80 mg via INTRAVENOUS
  Filled 2021-11-10 (×4): qty 2

## 2021-11-10 MED ORDER — ACETAMINOPHEN 325 MG PO TABS: 650.0000 mg | ORAL_TABLET | Freq: Four times a day (QID) | ORAL | Status: DC | PRN

## 2021-11-10 MED ORDER — INSULIN ASPART 100 UNIT/ML IJ SOLN: 0.0000 [IU] | Freq: Every day | INTRAMUSCULAR | Status: DC

## 2021-11-10 MED ORDER — CEFTRIAXONE SODIUM 1 G IJ SOLR
1.0000 g | Freq: Once | INTRAMUSCULAR | Status: DC
  Filled 2021-11-10: qty 10

## 2021-11-10 MED ORDER — PRAVASTATIN SODIUM 40 MG PO TABS
20.0000 mg | ORAL_TABLET | Freq: Every evening | ORAL | Status: DC
  Administered 2021-11-10 – 2021-11-13 (×4): 20 mg via ORAL
  Filled 2021-11-10 (×3): qty 1
  Filled 2021-11-10: qty 2

## 2021-11-10 MED ORDER — IPRATROPIUM-ALBUTEROL 0.5-2.5 (3) MG/3ML IN SOLN
3.0000 mL | Freq: Once | RESPIRATORY_TRACT | Status: AC
  Administered 2021-11-10: 3 mL via RESPIRATORY_TRACT
  Filled 2021-11-10: qty 6

## 2021-11-10 NOTE — ED Provider Triage Note (Signed)
Emergency Medicine Provider Triage Evaluation Note ? ?[name] , a 76 y.o. male  was evaluated in triage.  Pt complains of SOB since leaving hospital which was 3/12. COPD hx. Was intubated during last admission.  ? ?Has been using home oxygen.  ? ?Review of Systems  ?Positive: SOB ?Negative: Fever  ? ?Physical Exam  ?There were no vitals taken for this visit. ?Gen:   Awake, no distress   ?Resp:  Normal effort  ?MSK:   Moves extremities without difficulty  ?Other:  Diffuse wheezing ? ?Medical Decision Making  ?Medically screening exam initiated at 7:35 PM.  Appropriate orders placed.  [name] was informed that the remainder of the evaluation will be completed by another provider, this initial triage assessment does not replace that evaluation, and the importance of remaining in the ED until their evaluation is complete. ? ?SpO2 97% after I warmed hands.  ? ?Patient is however at high risk for decompensation. Will need to be priority for room placement.  ?  ?Tedd Sias, [place] ?11/10/21 1937 ? ?

## 2021-11-10 NOTE — H&P (Addendum)
History and Physical  Manuel [name]:[id number] DOB: [birth year] DOA: 11/10/2021  Referring physician: Dr.  Ferdinand Cava, EDP PCP: Irena Reichmann, DO  Outpatient Specialists: Pulmonary, palliative care, general surgery Patient coming from: Home  Chief Complaint: Worsening shortness of breath x1 week  HPI: Manuel [name] is a 76 y.o. male with medical history significant for OSA on CPAP nightly, hypertension, hyperlipidemia, type 2 diabetes, COPD, chronic hypoxia on 2 L nasal cannula at baseline, former tobacco user, quit 05/2021, AAA, history of GI bleed, cirrhosis of liver, CKD, BPH, history of nonbleeding gastric ulcer in 2019, who presented to [facility] ED with complaints of gradually worsening shortness of breath of 1 week duration.  Associated with a productive cough with light-colored phlegm, and generalized weakness.  Patient was recently admitted to the hospital for aspiration pneumonia, was found to have acute hypoxic and hypercarbic respiratory failure requiring intubation, was discharged on 10/01/2021.  Upon presentation to the ED, work-up revealed acute COPD exacerbation with bilateral diffuse wheezing on exam with mild rales at bases.  Notable bilateral lower extremity edema with concern for superimposed heart failure exacerbation.  Chest x-ray showing right-sided pleural effusion and pulm edema.  BNP is equivocal 178.  Patient was started on IV antibiotics, IV steroids and nebulizer breathing treatment in the ED.  TRH, hospitalist team, was asked to admit.  ED Course:  Tmax 97.9.  BP 118/67, pulse 62, respiratory 19, saturation 85% on room air and 96% on 3 L.  Lab studies remarkable for serum bicarb 38, creatinine 1.56 with GFR 46.  Baseline creatinine 1.2 with GFR greater than 60.  WBC 2.8, hemoglobin 13.4, MCV 103.  Review of Systems: Review of systems as noted in the HPI. All other systems reviewed and are negative.   Past Medical History:  Diagnosis Date   AAA (abdominal aortic aneurysm)  (HCC)    per cardiologist note dated 03-14-2018 last duplex , 30-31mm  (dr Jacinto Halim)   Acute GI bleeding 12/19/2017   Allergic rhinitis, seasonal    Anemia secondary to renal failure    Anticoagulant long-term use    ELIQUIS   Arthritis    LOWER BACK   B12 deficiency    BPH (benign prostatic hyperplasia)    Cirrhosis of liver (HCC) 10/04/2021   CKD (chronic kidney disease), stage III (HCC)    Colon cancer (HCC) 05/29/2018   No confirmed pathology indicating colon cancer.  Reports indicate benign findings on colonoscopy.  Had pneumatosis intestinalis followed by SBO with colostomy and reversal.  Joycelyn Man FNP-C   Colonic stricture (HCC) 12/13/2017   Colostomy present (HCC)    Congenital absence of left kidney followed by nephrologist at [facility]--- dr detarding   per CT 12-11-2017 and 01-02-2018   COPD with hypoxia (HCC) 10/04/2021   Duodenal ulcer 12/20/2017   Dyslipidemia    Esophageal candidiasis (HCC) 12/19/2017   Gastric ulcer without hemorrhage or perforation 12/20/2017   History of bowel resection 12/13/2017   for sigmoid stricture-- emergency sigmoidectomy   Hypercholesteremia 09/19/2018   Hyperparathyroidism, secondary renal (HCC)    Hypertension    Left carotid bruit    OSA on CPAP    followed by [facility]   Permanent atrial fibrillation [facility]) 2017   cardiologist-  dr Jacinto Halim   Solitary right kidney    Type 2 diabetes mellitus (HCC)    followed by pcp   Wears glasses    Wears partial dentures    UPPER AND LOWER   Past Surgical History:  Procedure Laterality Date   BIOPSY  12/19/2017   Procedure: BIOPSY;  Surgeon: Jeani Hawking, MD;  Location: [facility] ENDOSCOPY;  Service: Endoscopy;;   CARDIOVASCULAR STRESS TEST  11/16/2011   low risk nuclear study w/ no ischemia/  normal LV function and wall motion , ef 60%   CARDIOVERSION  12/18/2011   Procedure: CARDIOVERSION;  Surgeon: Pamella Pert, MD;  Location: [facility] OR;  Service: Cardiovascular;  Laterality:  N/A;   CATARACT EXTRACTION W/ INTRAOCULAR LENS IMPLANT Right 2015   COLON RESECTION N/A 12/13/2017   Procedure: LAPAROSCOPIC END COLOSTOMY HARTMAN'S PROCEDURE;  Surgeon: Romie Levee, MD;  Location: [facility] ORS;  Service: General;  Laterality: N/A;   COLOSTOMY TAKEDOWN N/A 05/29/2018   Procedure: LAPAROSCOPIC COLOSTOMY REVERSAL ERAS PATHWAY, RIGID PROCTOSCOPY;  Surgeon: Romie Levee, MD;  Location: [facility] ORS;  Service: General;  Laterality: N/A;   CYST REMOVED FROM UPPER BACK  09/2017   BENIGN   ESOPHAGOGASTRODUODENOSCOPY N/A 12/19/2017   Procedure: ESOPHAGOGASTRODUODENOSCOPY (EGD);  Surgeon: Jeani Hawking, MD;  Location: [facility] ENDOSCOPY;  Service: Endoscopy;  Laterality: N/A;   FLEXIBLE SIGMOIDOSCOPY N/A 12/13/2017   Procedure: FLEXIBLE SIGMOIDOSCOPY;  Surgeon: Jeani Hawking, MD;  Location: [facility] ENDOSCOPY;  Service: Endoscopy;  Laterality: N/A;   INGUINAL HERNIA REPAIR Left 1980s   AND REMOVAL CYST ON UPPER BACK   PAROTIDECTOMY Right 09-21-1999   dr Jearld Fenton @[facility]    salivery gland mass  (benign warthin's tumor)   RIGHT HEART CATH N/A 08/22/2021   Procedure: RIGHT HEART CATH;  Surgeon: Yates Decamp, MD;  Location: [facility] INVASIVE CV LAB;  Service: Cardiovascular;  Laterality: N/A;   TRANSTHORACIC ECHOCARDIOGRAM  01/04/2018   mild LVH, ef 50-55%, grade 2 diastolic function/  trivial AR and MR/  mild LAE/  mild to moderate TR    Social History:  reports that he quit smoking about 15 months ago. His smoking use included cigarettes. He has a 15.00 pack-year smoking history. He has never used smokeless tobacco. He reports that he does not drink alcohol and does not use drugs.   Allergies  Allergen Reactions   Sulfa Antibiotics Shortness Of Breath    Headaches    Colchicine     Other reaction(s): diarrhea   Labetalol Hcl     Other reaction(s): dysuria   Tramadol Hcl     Other reaction(s): nausea    Family History  Problem Relation Age of Onset   Cancer Mother    Cancer Father    Alzheimer's disease  Father    Diabetes Brother    Kidney disease Paternal Aunt    Sleep apnea Neg Hx       Prior to Admission medications   Medication Sig Start Date End Date Taking? Authorizing Provider  acetaminophen (TYLENOL) 500 MG tablet Take 500 mg by mouth every 6 (six) hours as needed for moderate pain.    [provider]  albuterol (PROVENTIL) (2.5 MG/3ML) 0.083% nebulizer solution Take 3 mLs (2.5 mg total) by nebulization every 4 (four) hours as needed for wheezing or shortness of breath. 10/01/21 10/01/22  Shon Hale, MD  albuterol (VENTOLIN HFA) 108 (90 Base) MCG/ACT inhaler Inhale 2 puffs into the lungs every 4 (four) hours as needed for wheezing or shortness of breath. 10/01/21   Shon Hale, MD  apixaban (ELIQUIS) 5 MG TABS tablet Take 1 tablet (5 mg total) by mouth 2 (two) times daily. 10/01/21   Shon Hale, MD  D3-50 1.25 MG (50000 UT) capsule Take 50,000 Units by mouth every Wednesday. 05/08/21  [provider]  diltiazem (CARDIZEM CD) 240 MG 24 hr capsule Take 1 capsule (240 mg total) by mouth daily. 10/02/21   Shon Hale, MD  doxazosin (CARDURA) 4 MG tablet Take 1 tablet (4 mg total) by mouth at bedtime. 10/01/21   Emokpae, Courage, MD  FARXIGA 10 MG TABS tablet Take 1 tablet (10 mg total) by mouth daily. 10/01/21   Shon Hale, MD  ferrous sulfate 325 (65 FE) MG tablet Take 1 tablet (325 mg total) by mouth daily with breakfast. 10/01/21   Shon Hale, MD  furosemide (LASIX) 40 MG tablet Take 1 tablet (40 mg total) by mouth in the morning. 10/01/21   Shon Hale, MD  lisinopril (ZESTRIL) 10 MG tablet Take 1 tablet (10 mg total) by mouth at bedtime. 08/07/21   Cantwell, Celeste C, PA-C  metFORMIN (GLUCOPHAGE) 500 MG tablet Take 1 tablet (500 mg total) by mouth 2 (two) times daily with a meal. 10/01/21   Emokpae, Courage, MD  potassium chloride (KLOR-CON) 10 MEQ tablet Take 2 tablets (20 mEq total) by mouth daily. Take while Taking Laisx/Furosemide  10/01/21 12/30/21  Shon Hale, MD  pravastatin (PRAVACHOL) 20 MG tablet Take 20 mg by mouth every evening.  01/03/16   [provider]  vitamin B-12 (CYANOCOBALAMIN) 1000 MCG tablet Take 1,000 mcg by mouth 2 (two) times daily.    [provider]    Physical Exam: BP 101/63   Pulse 88   Temp 97.9 F (36.6 C) (Oral)   Resp 16   SpO2 98%   General: 76 y.o. year-old male well developed well nourished in no acute distress.  Alert and oriented x3. Cardiovascular: Irregular rate and rhythm with no rubs or gallops.  No thyromegaly or JVD noted.  2+ pitting edema in lower extremities bilaterally.   Respiratory: Diffuse wheezing bilaterally.  Mild rales at bases.  Poor inspiratory effort. Abdomen: Soft nontender nondistended with normal bowel sounds x4 quadrants. Muskuloskeletal: No cyanosis or clubbing.  2+ pitting edema in lower extremities bilaterally. Neuro: CN II-XII intact, strength, sensation, reflexes Skin: No ulcerative lesions noted or rashes Psychiatry: Judgement and insight appear normal. Mood is appropriate for condition and setting          Labs on Admission:  Basic Metabolic Panel: Recent Labs  Lab 11/10/21 1949  NA 142  K 4.9  CL 99  CO2 38*  GLUCOSE 106*  BUN 22  CREATININE 1.56*  CALCIUM 9.2   Liver Function Tests: No results for input(s): AST, ALT, ALKPHOS, BILITOT, PROT, ALBUMIN in the last 168 hours. No results for input(s): LIPASE, AMYLASE in the last 168 hours. No results for input(s): AMMONIA in the last 168 hours. CBC: Recent Labs  Lab 11/10/21 1949  WBC 2.8*  HGB 13.4  HCT 45.9  MCV 103.4*  PLT 171   Cardiac Enzymes: No results for input(s): CKTOTAL, CKMB, CKMBINDEX, TROPONINI in the last 168 hours.  BNP (last 3 results) Recent Labs    06/06/21 1625 09/28/21 1442 10/01/21 0433  BNP 359.2* 629.0* 325.0*    ProBNP (last 3 results) No results for input(s): PROBNP in the last 8760 hours.  CBG: No results for  input(s): GLUCAP in the last 168 hours.  Radiological Exams on Admission: DG Chest 2 View  Result Date: 11/10/2021 CLINICAL DATA:  Shortness of breath EXAM: CHEST - 2 VIEW COMPARISON:  09/30/2021 FINDINGS: Moderate right pleural effusion with right lower lobe airspace disease concerning for pneumonia. Heart is mildly enlarged. Left lung clear. No acute  bony abnormality. IMPRESSION: Right lower lobe consolidation with right effusion. Findings concerning for pneumonia. Electronically Signed   By: Charlett Nose M.D.   On: 11/10/2021 20:27    EKG: I independently viewed the EKG done and my findings are as followed: Bradycardia rate of 53.  Nonspecific ST-T changes.  QTc 365.  Assessment/Plan Present on Admission:  COPD exacerbation (HCC)  Principal Problem:   COPD exacerbation (HCC)  COPD exacerbation with concern for superimposed acute on chronic diastolic CHF. At baseline patient is on 2 L nasal cannula Diffuse wheezing and mild rales at bases bilaterally on lung auscultation IV Solu-Medrol, Rocephin, DuoNebs every 6 hours Pulmonary toilet, incentive spirometer Maintain O2 saturation greater than 90% Added a dose of IV Lasix due to pulmonary edema and right pleural effusion  Presumptive right lower lobe CAP, POA Pneumonia cannot be excluded per radiology interpretation of chest x-ray Obtain procalcitonin level in the morning Continue IV antibiotics Rocephin 2 g daily Rest of management as stated above Monitor fever curve and WBC  Moderate right pleural effusion and concern with mild pulmonary edema BNP equivocal 178 Incentive spirometer 1 dose of IV Lasix 20 mg x 1 IR consulted for thoracentesis, diagnostic and therapeutic purposes  Acute on chronic hypoxic and hypercarbic respiratory failure secondary to above On 2 L nasal cannula at baseline and currently requiring 3 L to maintain oxygen saturation greater than 90%. Continue to maintain oxygen saturation greater 90% Wean off  oxygen supplementation as tolerated  Acute on chronic HFpEF 50-55% Last 2D echo done in 2019 revealed LVEF 50 to 55% with grade 2 diastolic dysfunction Obtain 2D echo, follow results. Start strict I's and O's and daily weight  AKI on CKD 2 Baseline creatinine appears to be 1.2 with GFR greater than 60 Presented with creatinine 1.56 with GFR 46 Avoid nephrotoxic agents and hypotension. Closely monitor urine output with strict I's and O's.  Chronic A-fib Rate controlled Resume home Eliquis for CVA prevention Continue to monitor on telemetry  OSA Continue CPAP nightly  Physical debility PT OT to assess Fall precautions. TOC consulted to assist with DC planning    Critical care time: 65 minutes.    DVT prophylaxis: Eliquis  Code Status: Full code  Family Communication: None at bedside  Disposition Plan: Admitted to progressive unit  Consults called: Please consult cardiology if 2D echo returns abnormal.  Admission status: Inpatient status.   Status is: Inpatient Patient requires at least 2 midnights for further evaluation and treatment of present condition.   Darlin Drop MD [facility] Pager [phone]  If 7PM-7AM, please contact night-coverage [email or web address] Password [facility]  11/10/2021, 9:45 PM

## 2021-11-10 NOTE — Progress Notes (Signed)
? ? ?Manufacturing engineer ?Community Palliative Care Consult Note ?Telephone: [phone]  ?Fax: [phone]  ? ? ?Date of encounter: 11/10/21 ?6:48 PM ?PATIENT NAME: Manuel [name] ?[place] [address]   ?[phone] (home)  ?DOB: September 20, 1945 ?MRN: [id number] ?PRIMARY CARE PROVIDER:    ?Manuel Morning, DO,  ?[address] ?[place] [address] ?[phone] ? ?REFERRING PROVIDER:   ?Manuel Morning, DO ?[address] ?[address] ?[place],  [place] [address] ?[phone] ? ?RESPONSIBLE PARTY:    ?Contact Information   ? ? Name Relation Home Work Mobile  ? Manuel, [name] Spouse [phone]  [phone]  ? Manuel,[name] Son [phone]    ? ?  ? ? ? ?I met face to face with patient, wife and their son Manuel [name] in their home. Palliative Care was asked to follow this patient by consultation request of  Manuel Morning, DO to address advance care planning and complex medical decision making. This is an acute follow up visit. ? ?ASSESSMENT, SYMPTOM MANAGEMENT AND PLAN / RECOMMENDATIONS:  ? Acute on Chronic Respiratory Failure with Hypoxia ?Secondary to cor pulmonale, COPD, atrial fibrillation and recent hx of aspiration pneumonia. ?O2 @ 3L [place]  ?Likely fatigue relative to hypoxia, poor intake. ?Notified [place], Agricultural consultant at [employer]  ED of pt's impending arrival and presentation. ? ?Atrial fibrillation, rate controlled, anticoagulated on Eliquis ?Continue Cardizem CD 240 mg daily and Eliquis 5 mg BID ? ?3.  CHF ?Continue Cardizem CD 240 mg daily, farxiga 10 mg daily, Lisinopril 10 mg daily, and Lasix 40 mg daily. ?Optimize rate control of afib. ? ?4.    Palliative Care Encounter ?Pt and wife were checking O2 sats and now have O2 bled into CPAP at night. ?Question some depression/failure to thrive/possible CHF exacerbation or worsening pleural effusions. ?Encouraged ER visit to assess CHF, pleural effusion or arrythmia. ? ?5.   OSA ?Continue compliance with  CPAP ?Supplemental O2 bled in at night to CPAP ? ?6.  Protein calorie malnutrition ?Weight 191 on 10/20/21 at MD office, now 178 on home scale. ? ?7.  Moderate depression ?Consider addition of Mirtazapine 15 mg to stimulate appetite and improve mood if QT not too long. ? ? ? ?Advance Care Planning/Goals of Care:  ?CODE STATUS: ?Currently full code, plan to address goals of care on next contact ? ? ? ?Follow up Palliative Care Visit: Palliative care will continue to follow for complex medical decision making, advance care planning, and clarification of goals. Return visit already scheduled 11/23/21, may follow up sooner based off hospital results ? ? ?This visit was coded based on medical decision making (MDM). ? ?PPS: 30% (decline from 70% at last visit) ? ?HOSPICE ELIGIBILITY/DIAGNOSIS: TBD ? ?Chief Complaint:  ?Spouse called in and requested acute visit as pt is being lethargic and not eating. ? ?HISTORY OF PRESENT ILLNESS:  Manuel [name] is a 76 y.o. year old male with acute on chronic respiratory failure due to aspiration pneumonia, COPD and cor pulmonale. He also has HTN, OSA on CPAP, permanent atrial fibrillation, AAA without rupture, cirrhosis of the liver, hx of SBO and GI bleed due to duodenal ulcer, hx of colon cancer, DM and secondary hyperparathyroidism, congenital absence of left kidney and CKD with anemia, BPH, colostomy present and HLD, and Vitamin B12 deficiency. ? HGB A1c was 6.6% on 09/29/21.  Has some lightheadedness on standing.  He states his energy is gone and "I just want to feel better".  States he has been feeling bad for several  days.  Pt c/o heaviness of chest when laying down, unable to get comfortable or sleep and has to sit up in the recliner to get comfortable.  He states he has O2 bled into his CPAP but doesn't think this is working because he feels better when he gets up and sits in his chair with his O2 on rather than laying down with his CPAP on.  He states significant decrease in  his activity tolerance and lightheadedness when standing.  He was stood up to check his weight and was wearing O2 @ 3L [place] and sats declined from 92-90%.  He states being tired of not being able to go out and have time with his buddies or feeling like working in the yard.  Wife and son state his appetite is very poor, he sleeps a large part of the day sitting in the chair.  He doesn't want to go anywhere or do anything.  Denies CP, nausea, nocturia.  With recent treatment for aspiration pneumonia pt was identified as having bilateral pleural effusions as well.  He does endorse some feelings of being useless because he feels so bad and being depressed over decline in function.  He is tearful when discussing this.  Did discuss the possibility of some medication to help improve mood until he could see if his condition was going to improve or deteriorate further.  Wife states at times he has been confused and repeating himself. ? ?History obtained from review of EMR, discussion with family and/or Manuel [name].  ?I reviewed available labs, medications, imaging, studies and related documents from the EMR.  Records reviewed and summarized above.  ? ?ROS ? ?General: NAD ?EYES: denies vision changes ?ENMT: denies dysphagia ?Cardiovascular: denies chest pain, endorses DOE, orthopnea and PND ?Pulmonary: denies cough, endorses increased SOB ?Abdomen: endorses poor appetite, has colostomy ?GU: denies dysuria, endorses continence of urine ?MSK:  endorses increased weakness, no falls reported ?Skin: denies rashes or wounds ?Neurological: denies pain, endorses insomnia in bed, sleeps during day in chair ?Psych: Endorses depressed and hopeless mood ?Heme/lymph/immuno: denies bruises, abnormal bleeding ? ?Physical Exam: ?Current and past weights: 191 lbs 3.2 ounces as of 10/20/21, current weight on home scale today 178 lbs ?Constitutional: NAD, appears fatigued and lethargic ?General: thin, WD ?EYES: anicteric sclera, lids intact, no  discharge  ?ENMT: intact hearing, oral mucous membranes moist, dentition intact ?CV: S1S2, IRIR rate controlled with distant heart sounds, no RLE edema, LLE trace, non-pitting ?Pulmonary: Diminished in RML and RLL with faint crackles in RLL, Left lung clear, no increased work of breathing, no cough, sat 91% at rest on O2 @ 3L, non-productive cough and sats improve to 92-93% On standing sats drop to 90% and pt lightheaded  ?Abdomen: normo-active BS + 4 quadrants, soft and non tender, no ascites ?GU: deferred ?MSK: no sarcopenia, moves all extremities, stands with poor standing and gait balance ?Skin: warm and dry, no rashes or wounds on visible skin ?Neuro:  noted generalized weakness and will drift off to sleep if not being stimulated,  no cognitive impairment ?Psych:  Flat affect, arousable, intermittently tearful ?Hem/lymph/immuno: no widespread bruising ? ? ?Thank you for the opportunity to participate in the care of Manuel [name].  The palliative care team will continue to follow. Please call our office at [phone] if we can be of additional assistance.  ? ?Marijo Conception, FNP-C ? ?COVID-19 PATIENT SCREENING TOOL ?Asked and negative response unless otherwise noted:  ? ?Have you had symptoms of covid, tested  positive or been in contact with someone with symptoms/positive test in the past 5-10 days? No ? ?

## 2021-11-10 NOTE — ED Provider Notes (Signed)
?[facility] ?Provider Note ? ? ?CSN: [id number] ?Arrival date & time: 11/10/21  1847 ? ?  ? ?History ? ?Chief Complaint  ?Patient presents with  ? Shortness of Breath  ? ? ?Manuel [name] is a 76 y.o. male. ? ? ?Shortness of Breath ? ?Patient is 76 year old male with past medical history of COPD (baseline on 3 L), atrial fibrillation, CKD who presents to the emergency department for week of progressively worsening shortness of breath and generalized weakness.  He reports he has been feeling weak and exhausted.  He did have 2 episodes of loose 3 days ago.  Denies associated cough or fever.  Denies any recent sick contact.  He currently denies chest pain but reports exertional dyspnea.  He denies any recent weight gain.  He reports being compliant with his medication.  He does report a history of tobacco consumption.  He does not report his albuterol more frequently.  No urinary symptoms.  No focal weakness.  Otherwise no other complaints. ? ?Home Medications ?Prior to Admission medications   ?Medication Sig Start Date End Date Taking? Authorizing Provider  ?acetaminophen (TYLENOL) 500 MG tablet Take 500 mg by mouth every 6 (six) hours as needed for moderate pain.    [provider]  ?albuterol (PROVENTIL) (2.5 MG/3ML) 0.083% nebulizer solution Take 3 mLs (2.5 mg total) by nebulization every 4 (four) hours as needed for wheezing or shortness of breath. 10/01/21 10/01/22  Roxan Hockey, MD  ?albuterol (VENTOLIN HFA) 108 (90 Base) MCG/ACT inhaler Inhale 2 puffs into the lungs every 4 (four) hours as needed for wheezing or shortness of breath. 10/01/21   Roxan Hockey, MD  ?apixaban (ELIQUIS) 5 MG TABS tablet Take 1 tablet (5 mg total) by mouth 2 (two) times daily. 10/01/21   Roxan Hockey, MD  ?D3-50 1.25 MG (50000 UT) capsule Take 50,000 Units by mouth every Wednesday. 05/08/21   [provider]  ?diltiazem (CARDIZEM CD) 240 MG 24 hr capsule Take 1 capsule  (240 mg total) by mouth daily. 10/02/21   Roxan Hockey, MD  ?doxazosin (CARDURA) 4 MG tablet Take 1 tablet (4 mg total) by mouth at bedtime. 10/01/21   Roxan Hockey, MD  ?FARXIGA 10 MG TABS tablet Take 1 tablet (10 mg total) by mouth daily. 10/01/21   Roxan Hockey, MD  ?ferrous sulfate 325 (65 FE) MG tablet Take 1 tablet (325 mg total) by mouth daily with breakfast. 10/01/21   Roxan Hockey, MD  ?furosemide (LASIX) 40 MG tablet Take 1 tablet (40 mg total) by mouth in the morning. 10/01/21   Roxan Hockey, MD  ?lisinopril (ZESTRIL) 10 MG tablet Take 1 tablet (10 mg total) by mouth at bedtime. 08/07/21   Cantwell, Celeste C, PA-C  ?metFORMIN (GLUCOPHAGE) 500 MG tablet Take 1 tablet (500 mg total) by mouth 2 (two) times daily with a meal. 10/01/21   Emokpae, Courage, MD  ?potassium chloride (KLOR-CON) 10 MEQ tablet Take 2 tablets (20 mEq total) by mouth daily. Take while Taking Laisx/Furosemide 10/01/21 12/30/21  Roxan Hockey, MD  ?pravastatin (PRAVACHOL) 20 MG tablet Take 20 mg by mouth every evening.  01/03/16   [provider]  ?vitamin B-12 (CYANOCOBALAMIN) 1000 MCG tablet Take 1,000 mcg by mouth 2 (two) times daily.    [provider]  ?   ? ?Allergies    ?Sulfa antibiotics, Colchicine, Labetalol hcl, and Tramadol hcl   ? ?Review of Systems   ?Review of Systems  ?Respiratory:  Positive for shortness  of breath.   ? ?Physical Exam ?Updated Vital Signs ?BP 101/63   Pulse 88   Temp 97.9 ?F (36.6 ?C) (Oral)   Resp 16   SpO2 98%  ?Physical Exam ?Vitals and nursing note reviewed.  ?Constitutional:   ?   General: He is not in acute distress. ?   Comments: Chronically ill-appearing  ?HENT:  ?   Head: Normocephalic and atraumatic.  ?Eyes:  ?   Extraocular Movements: Extraocular movements intact.  ?   Conjunctiva/sclera: Conjunctivae normal.  ?   Pupils: Pupils are equal, round, and reactive to light.  ?Cardiovascular:  ?   Rate and Rhythm: Normal rate and regular rhythm.  ?   Heart sounds:  No murmur heard. ?Pulmonary:  ?   Effort: Pulmonary effort is normal.  ?   Breath sounds: Examination of the right-upper field reveals wheezing. Examination of the left-upper field reveals wheezing. Examination of the right-middle field reveals wheezing. Examination of the left-middle field reveals wheezing. Wheezing present.  ?   Comments: On 3 L [place]  ?Chest:  ?   Chest wall: No tenderness.  ?Abdominal:  ?   Palpations: Abdomen is soft.  ?   Tenderness: There is no abdominal tenderness.  ?Musculoskeletal:     ?   General: No swelling.  ?   Cervical back: Neck supple.  ?   Right lower leg: No edema.  ?   Left lower leg: No edema.  ?Skin: ?   General: Skin is warm and dry.  ?   Capillary Refill: Capillary refill takes less than 2 seconds.  ?Neurological:  ?   General: No focal deficit present.  ?   Mental Status: He is alert and oriented to person, place, and time.  ?Psychiatric:     ?   Mood and Affect: Mood normal.  ? ? ?ED Results / Procedures / Treatments   ?Labs ?(all labs ordered are listed, but only abnormal results are displayed) ?Labs Reviewed  ?BASIC METABOLIC PANEL - Abnormal; Notable for the following components:  ?    Result Value  ? CO2 38 (*)   ? Glucose, Bld 106 (*)   ? Creatinine, Ser 1.56 (*)   ? GFR, Estimated 46 (*)   ? All other components within normal limits  ?CBC - Abnormal; Notable for the following components:  ? WBC 2.8 (*)   ? MCV 103.4 (*)   ? MCHC 29.2 (*)   ? RDW 15.7 (*)   ? All other components within normal limits  ?RESP PANEL BY RT-PCR (FLU A&B, COVID) ARPGX2  ?BRAIN NATRIURETIC PEPTIDE  ?TROPONIN I (HIGH SENSITIVITY)  ?TROPONIN I (HIGH SENSITIVITY)  ? ? ?EKG ? ?Radiology ?DG Chest 2 View ? ?Result Date: 11/10/2021 ?CLINICAL DATA:  Shortness of breath EXAM: CHEST - 2 VIEW COMPARISON:  09/30/2021 FINDINGS: Moderate right pleural effusion with right lower lobe airspace disease concerning for pneumonia. Heart is mildly enlarged. Left lung clear. No acute bony abnormality. IMPRESSION:  Right lower lobe consolidation with right effusion. Findings concerning for pneumonia. Electronically Signed   By: Rolm Baptise M.D.   On: 11/10/2021 20:27   ? ?Procedures ?Procedures  ? ?Medications Ordered in ED ?Medications  ?ipratropium-albuterol (DUONEB) 0.5-2.5 (3) MG/3ML nebulizer solution 3 mL (has no administration in time range)  ?magnesium sulfate IVPB 2 g 50 mL (2 g Intravenous New Bag/Given 11/10/21 2148)  ?apixaban (ELIQUIS) tablet 5 mg (has no administration in time range)  ?Cholecalciferol 50,000 Units (has no administration in time range)  ?  doxazosin (CARDURA) tablet 4 mg (has no administration in time range)  ?ferrous sulfate tablet 325 mg (has no administration in time range)  ?pravastatin (PRAVACHOL) tablet 20 mg (has no administration in time range)  ?insulin aspart (novoLOG) injection 0-9 Units (has no administration in time range)  ?insulin aspart (novoLOG) injection 0-5 Units (has no administration in time range)  ?acetaminophen (TYLENOL) tablet 650 mg (has no administration in time range)  ?polyethylene glycol (MIRALAX / GLYCOLAX) packet 17 g (has no administration in time range)  ?melatonin tablet 3 mg (has no administration in time range)  ?ondansetron (ZOFRAN) injection 4 mg (has no administration in time range)  ?cefTRIAXone (ROCEPHIN) 2 g in sodium chloride 0.9 % 100 mL IVPB (has no administration in time range)  ?methylPREDNISolone sodium succinate (SOLU-MEDROL) 40 mg/mL injection 40 mg (has no administration in time range)  ?pantoprazole (PROTONIX) EC tablet 40 mg (has no administration in time range)  ? ? ?ED Course/ Medical Decision Making/ A&P ?  ?                        ?Medical Decision Making ?Problems Addressed: ?Community acquired pneumonia of right lung, unspecified part of lung: acute illness or injury that poses a threat to life or bodily functions ?COPD exacerbation ([facility] [place] [place]): acute illness or injury that poses a threat to life or bodily functions ?Dyspnea, unspecified type:  acute illness or injury that poses a threat to life or bodily functions ? ?Amount and/or Complexity of Data Reviewed ?Labs: ordered. Decision-making details documented in ED Course. ?Radiology: ordered and inde

## 2021-11-10 NOTE — ED Triage Notes (Signed)
Pt complains of SOB since leaving hospital which was 3/12. COPD hx. Was intubated during last admission ?

## 2021-11-11 ENCOUNTER — Other Ambulatory Visit: Payer: Self-pay

## 2021-11-11 ENCOUNTER — Inpatient Hospital Stay (HOSPITAL_COMMUNITY): Payer: Medicare HMO

## 2021-11-11 DIAGNOSIS — J9621 Acute and chronic respiratory failure with hypoxia: Secondary | ICD-10-CM

## 2021-11-11 DIAGNOSIS — J90 Pleural effusion, not elsewhere classified: Secondary | ICD-10-CM

## 2021-11-11 DIAGNOSIS — J9622 Acute and chronic respiratory failure with hypercapnia: Secondary | ICD-10-CM

## 2021-11-11 DIAGNOSIS — J189 Pneumonia, unspecified organism: Secondary | ICD-10-CM | POA: Diagnosis not present

## 2021-11-11 DIAGNOSIS — J441 Chronic obstructive pulmonary disease with (acute) exacerbation: Secondary | ICD-10-CM | POA: Diagnosis not present

## 2021-11-11 LAB — COMPREHENSIVE METABOLIC PANEL
ALT: 12 U/L (ref 0–44)
AST: 13 U/L — ABNORMAL LOW (ref 15–41)
Albumin: 3 g/dL — ABNORMAL LOW (ref 3.5–5.0)
Alkaline Phosphatase: 62 U/L (ref 38–126)
Anion gap: 6 (ref 5–15)
BUN: 24 mg/dL — ABNORMAL HIGH (ref 8–23)
CO2: 39 mmol/L — ABNORMAL HIGH (ref 22–32)
Calcium: 8.9 mg/dL (ref 8.9–10.3)
Chloride: 97 mmol/L — ABNORMAL LOW (ref 98–111)
Creatinine, Ser: 1.6 mg/dL — ABNORMAL HIGH (ref 0.61–1.24)
GFR, Estimated: 45 mL/min — ABNORMAL LOW (ref >60)
Glucose, Bld: 225 mg/dL — ABNORMAL HIGH (ref 70–99)
Potassium: 4.2 mmol/L (ref 3.5–5.1)
Sodium: 142 mmol/L (ref 135–145)
Total Bilirubin: 0.8 mg/dL (ref 0.3–1.2)
Total Protein: 6 g/dL — ABNORMAL LOW (ref 6.5–8.1)

## 2021-11-11 LAB — PROCALCITONIN: Procalcitonin: <0.1 ng/mL

## 2021-11-11 LAB — CBC WITH DIFFERENTIAL/PLATELET
Abs Immature Granulocytes: 0.01 10*3/uL (ref 0.00–0.07)
Basophils Absolute: 0 10*3/uL (ref 0.0–0.1)
Basophils Relative: 0 %
Eosinophils Absolute: 0 10*3/uL (ref 0.0–0.5)
Eosinophils Relative: 0 %
HCT: 42.6 % (ref 39.0–52.0)
Hemoglobin: 12.7 g/dL — ABNORMAL LOW (ref 13.0–17.0)
Immature Granulocytes: 0 %
Lymphocytes Relative: 3 %
Lymphs Abs: 0.1 10*3/uL — ABNORMAL LOW (ref 0.7–4.0)
MCH: 30.6 pg (ref 26.0–34.0)
MCHC: 29.8 g/dL — ABNORMAL LOW (ref 30.0–36.0)
MCV: 102.7 fL — ABNORMAL HIGH (ref 80.0–100.0)
Monocytes Absolute: 0 10*3/uL — ABNORMAL LOW (ref 0.1–1.0)
Monocytes Relative: 1 %
Neutro Abs: 3.5 10*3/uL (ref 1.7–7.7)
Neutrophils Relative %: 96 %
Platelets: 141 10*3/uL — ABNORMAL LOW (ref 150–400)
RBC: 4.15 MIL/uL — ABNORMAL LOW (ref 4.22–5.81)
RDW: 15.4 % (ref 11.5–15.5)
WBC: 3.6 10*3/uL — ABNORMAL LOW (ref 4.0–10.5)
nRBC: 0 % (ref 0.0–0.2)

## 2021-11-11 LAB — BODY FLUID CELL COUNT WITH DIFFERENTIAL
Eos, Fluid: 0 %
Lymphs, Fluid: 86 %
Monocyte-Macrophage-Serous Fluid: 12 % — ABNORMAL LOW (ref 50–90)
Neutrophil Count, Fluid: 2 % (ref 0–25)
Total Nucleated Cell Count, Fluid: 1541 cu mm — ABNORMAL HIGH (ref 0–1000)

## 2021-11-11 LAB — ALBUMIN, PLEURAL OR PERITONEAL FLUID: Albumin, Fluid: 2 g/dL

## 2021-11-11 LAB — PROTEIN, PLEURAL OR PERITONEAL FLUID: Total protein, fluid: 3.8 g/dL

## 2021-11-11 LAB — CBG MONITORING, ED
Glucose-Capillary: 180 mg/dL — ABNORMAL HIGH (ref 70–99)
Glucose-Capillary: 164 mg/dL — ABNORMAL HIGH (ref 70–99)
Glucose-Capillary: 234 mg/dL — ABNORMAL HIGH (ref 70–99)

## 2021-11-11 LAB — GLUCOSE, PLEURAL OR PERITONEAL FLUID: Glucose, Fluid: 191 mg/dL

## 2021-11-11 LAB — PROTEIN, TOTAL: Total Protein: 6.7 g/dL (ref 6.5–8.1)

## 2021-11-11 LAB — LACTATE DEHYDROGENASE, PLEURAL OR PERITONEAL FLUID: LD, Fluid: 253 U/L — ABNORMAL HIGH (ref 3–23)

## 2021-11-11 LAB — GLUCOSE, CAPILLARY: Glucose-Capillary: 156 mg/dL — ABNORMAL HIGH (ref 70–99)

## 2021-11-11 LAB — LACTATE DEHYDROGENASE: LDH: 173 U/L (ref 98–192)

## 2021-11-11 LAB — PHOSPHORUS: Phosphorus: 4.2 mg/dL (ref 2.5–4.6)

## 2021-11-11 LAB — MAGNESIUM: Magnesium: 2.2 mg/dL (ref 1.7–2.4)

## 2021-11-11 MED ORDER — SODIUM CHLORIDE 0.9 % IV SOLN
500.0000 mg | INTRAVENOUS | Status: DC
  Administered 2021-11-11 – 2021-11-14 (×4): 500 mg via INTRAVENOUS
  Filled 2021-11-11 (×4): qty 5

## 2021-11-11 MED ORDER — IPRATROPIUM-ALBUTEROL 0.5-2.5 (3) MG/3ML IN SOLN
3.0000 mL | Freq: Three times a day (TID) | RESPIRATORY_TRACT | Status: DC
  Administered 2021-11-12 (×3): 3 mL via RESPIRATORY_TRACT
  Filled 2021-11-11 (×3): qty 3

## 2021-11-11 MED ORDER — LIDOCAINE HCL (PF) 1 % IJ SOLN
INTRAMUSCULAR | Status: AC
  Filled 2021-11-11: qty 30

## 2021-11-11 NOTE — ED Notes (Signed)
Breakfast order placed ?

## 2021-11-11 NOTE — Procedures (Signed)
PROCEDURE SUMMARY: ? ?Successful US guided right thoracentesis. ?Yielded 1.6 L of blood tinged fluid. ?Pt tolerated procedure well. ?No immediate complications. ? ?Specimen was sent for labs. ?CXR ordered. ? ?EBL < 5 mL ? ?Ascencion Dike PA-C ?11/11/2021 ?9:14 AM ? ? ? ?

## 2021-11-11 NOTE — Progress Notes (Signed)
?PROGRESS NOTE ? ? ? ?Manuel [name]  RCV:[id number] DOB: 08-Jun-1946 DOA: 11/10/2021 ?PCP: [name], DO  ? ? ?Chief Complaint  ?Patient presents with  ? Shortness of Breath  ? ? ?Brief Narrative:  ? ? ? Manuel [name] is a 76 y.o. male with medical history significant for OSA on CPAP nightly, hypertension, hyperlipidemia, type 2 diabetes, COPD, chronic hypoxia on 2 L nasal cannula at baseline, former tobacco user, quit 05/2021, AAA, history of GI bleed, cirrhosis of liver, CKD, BPH, history of nonbleeding gastric ulcer in 2019, patient with recent hospitalization at [facility] due to pneumonia requiring intubation and ventilatory support and ICU stay, who presented to [facility] ED with complaints of gradually worsening shortness of breath of 1 week duration.  Associated with a productive cough with light-colored phlegm, and generalized weakness.  Patient was recently admitted to the hospital for aspiration pneumonia, was found to have acute hypoxic and hypercarbic respiratory failure requiring intubation, was discharged on 10/01/2021.  Upon presentation to the ED, work-up revealed acute COPD exacerbation with bilateral diffuse wheezing on exam with mild rales at bases.  Notable bilateral lower extremity edema with concern for superimposed heart failure exacerbation.  Chest x-ray showing right-sided pleural effusion and pulm edema.  BNP is equivocal 178.  Patient was started on IV antibiotics, IV steroids and nebulizer breathing treatment in the ED.   ? ?Assessment & Plan: ?  ?Principal Problem: ?  COPD exacerbation ([place]) ?Active Problems: ?  BPH (benign prostatic hyperplasia) ?  DM (diabetes mellitus) ([place]) ?  Primary hypertension ?  Hypercholesteremia ?  Acute on chronic respiratory failure with hypoxia and hypercapnia (HCC) ?  CAP (community acquired pneumonia) ?  Pleural effusion on right ? ? ?COPD exacerbation with concern for superimposed acute on chronic diastolic CHF. ?-He is with worsening dyspnea,  and significant wheezing bilaterally. ?-Continue to treat for COPD exacerbation with IV steroids, scheduled DuoNebs, he was encouraged use incentive spirometry and flutter valve. ? ?Presumptive right lower lobe CAP, POA ?Pneumonia cannot be excluded per radiology interpretation of chest x-ray ?Continue IV antibiotics Rocephin 2 g daily, will add IV Rocephin ?Rest of management as stated above ?Monitor fever curve and WBC ?  ?Moderate right pleural effusion and concern with mild pulmonary edema ?-Most likely exudative, but serum protein, serum LDH still pending, and little fluid protein still pending, will follow on these results ?-Status post thoracentesis 1.6 L drained, he was encouraged use incentive spirometer ?  ?Acute on chronic hypoxic and hypercarbic respiratory failure secondary to above ?On 2 L nasal cannula at baseline and currently requiring 3 L to maintain oxygen saturation greater than 90%. ?Continue to maintain oxygen saturation greater 90% ?Wean off oxygen supplementation as tolerated ?  ?Acute on chronic HFpEF 50-55% ?- Last 2D echo done in 2019 revealed LVEF 50 to 55% with grade 2 diastolic dysfunction ?- Follow on 2D echo ?- IV diuresis when blood pressure improves ?  ?AKI on CKD 2 ?Baseline creatinine appears to be 1.2 with GFR greater than 60 ?Presented with creatinine 1.56 with GFR 46 ?Avoid nephrotoxic agents and hypotension. ?Closely monitor urine output with strict I's and O's. ?  ?Chronic A-fib ?Rate controlled ?Resume home Eliquis for CVA prevention ?Continue to monitor on telemetry ? ?OSA ?Continue CPAP nightly ?  ?Physical debility ?PT OT to assess ?Fall precautions. ?TOC consulted to assist with DC planning ?  ?  ? ?DVT prophylaxis: on Eliquis ?Code Status: Full ?Family Communication: none at [place] ?Disposition:  ? ?  Status is: Inpatient ?Remains inpatient appropriate because: IV abx and steroids ?  ?Consultants:  ?none ? ? ?Subjective: ?Still reporting dyspnea, cough, generalized  weakness and fatigue ? ? ?Objective: ?Vitals:  ? 11/11/21 1215 11/11/21 1230 11/11/21 1245 11/11/21 1300  ?BP: 106/66 108/66 100/65 101/71  ?Pulse: 62 66 79 84  ?Resp: _0 ?Temp:      ?TempSrc:      ?SpO2: 100% 99% 98% 100%  ? ?No intake or output data in the 24 hours ending 11/11/21 1403 ?There were no vitals filed for this visit. ? ?Examination: ? ?Awake Alert, Oriented X 3, No new F.N deficits, frail, chronically ill-appearing.   ?Symmetrical Chest wall movement, managed air entry at the right lung base, scattered rhonchi and Rales at bases bilaterally ?RRR,No Gallops,Rubs or new Murmurs, No Parasternal Heave ?+ve B.Sounds, Abd Soft, No tenderness, No rebound - guarding or rigidity. ?No Cyanosis, Clubbing or edema, No new Rash or bruise   ? ? ? ? ?Data Reviewed: I have personally reviewed following labs and imaging studies ? ?CBC: ?Recent Labs  ?Lab 11/10/21 ?1949 11/11/21 ?2224  ?WBC 2.8* 3.6*  ?NEUTROABS  --  3.5  ?HGB 13.4 12.7*  ?HCT 45.9 42.6  ?MCV 103.4* 102.7*  ?PLT 171 141*  ? ? ?Basic Metabolic Panel: ?Recent Labs  ?Lab 11/10/21 ?1949 11/11/21 ?1146  ?NA 142 142  ?K 4.9 4.2  ?CL 99 97*  ?CO2 38* 39*  ?GLUCOSE 106* 225*  ?BUN 22 24*  ?CREATININE 1.56* 1.60*  ?CALCIUM 9.2 8.9  ?MG  --  2.2  ?PHOS  --  4.2  ? ? ?GFR: ?Estimated Creatinine Clearance: 45.5 mL/min (A) (by C-G formula based on SCr of 1.6 mg/dL (H)). ? ?Liver Function Tests: ?Recent Labs  ?Lab 11/11/21 ?4314  ?AST 13*  ?ALT 12  ?ALKPHOS 62  ?BILITOT 0.8  ?PROT 6.0*  ?ALBUMIN 3.0*  ? ? ?CBG: ?Recent Labs  ?Lab 11/10/21 ?2159 11/11/21 ?0740 11/11/21 ?1159  ?GLUCAP 97 180* 164*  ? ? ? ?Recent Results (from the past 240 hour(s))  ?Resp Panel by RT-PCR (Flu A&B, Covid) Nasopharyngeal Swab     Status: None  ? Collection Time: 11/10/21  7:38 PM  ? Specimen: Nasopharyngeal Swab; Nasopharyngeal(NP) swabs in vial transport medium  ?Result Value Ref Range Status  ? SARS Coronavirus 2 by RT PCR NEGATIVE NEGATIVE Final  ?  Comment:  (NOTE) ?SARS-CoV-2 target nucleic acids are NOT DETECTED. ? ?The SARS-CoV-2 RNA is generally detectable in upper respiratory ?specimens during the acute phase of infection. The lowest ?concentration of SARS-CoV-2 viral copies this assay can detect is ?138 copies/mL. A negative result does not preclude SARS-Cov-2 ?infection and should not be used as the sole basis for treatment or ?other patient management decisions. A negative result may occur with  ?improper specimen collection/handling, submission of specimen other ?than nasopharyngeal swab, presence of viral mutation(s) within the ?areas targeted by this assay, and inadequate number of viral ?copies(<138 copies/mL). A negative result must be combined with ?clinical observations, patient history, and epidemiological ?information. The expected result is Negative. ? ?Fact Sheet for Patients:  ?EntrepreneurPulse.com.au ? ?Fact Sheet for Healthcare Providers:  ?IncredibleEmployment.be ? ?This test is no t yet approved or cleared by the [place] FDA and  ?has been authorized for detection and/or diagnosis of SARS-CoV-2 by ?FDA under an Emergency Use Authorization (EUA). This EUA will remain  ?in effect (meaning this test can be used) for the duration of the ?COVID-19 declaration under Section  564(b)(1) of the Act, 21 ?U.S.C.section 360bbb-3(b)(1), unless the authorization is terminated  ?or revoked sooner.  ? ? ?  ? Influenza A by PCR NEGATIVE NEGATIVE Final  ? Influenza B by PCR NEGATIVE NEGATIVE Final  ?  Comment: (NOTE) ?The Xpert Xpress SARS-CoV-2/FLU/RSV plus assay is intended as an aid ?in the diagnosis of influenza from Nasopharyngeal swab specimens and ?should not be used as a sole basis for treatment. Nasal washings and ?aspirates are unacceptable for Xpert Xpress SARS-CoV-2/FLU/RSV ?testing. ? ?Fact Sheet for Patients: ?EntrepreneurPulse.com.au ? ?Fact Sheet for Healthcare  Providers: ?IncredibleEmployment.be ? ?This test is not yet approved or cleared by the [place] FDA and ?has been authorized for detection and/or diagnosis of SARS-CoV-2 by ?FDA under an Emergency Use Authorization (EUA). This EUA

## 2021-11-11 NOTE — ED Notes (Signed)
Patient transported to Ultrasound ?

## 2021-11-11 NOTE — Progress Notes (Signed)
Received patient from ED via stretcher.  Patient is alert and oriented x 4.  Ambulatory, able to transfer to bed with minimal assistance.  O2 at 3L [place].  Assisted in bed in position of comfort.  Oriented to room and unit routine.  Need addressed. ?

## 2021-11-11 NOTE — Progress Notes (Signed)
Pt placed on cpap auto mode with 2L TNC. Will monitor ?

## 2021-11-12 ENCOUNTER — Inpatient Hospital Stay (HOSPITAL_COMMUNITY): Payer: Medicare HMO

## 2021-11-12 DIAGNOSIS — J189 Pneumonia, unspecified organism: Secondary | ICD-10-CM | POA: Diagnosis not present

## 2021-11-12 DIAGNOSIS — J9621 Acute and chronic respiratory failure with hypoxia: Secondary | ICD-10-CM | POA: Diagnosis not present

## 2021-11-12 DIAGNOSIS — J90 Pleural effusion, not elsewhere classified: Secondary | ICD-10-CM | POA: Diagnosis not present

## 2021-11-12 DIAGNOSIS — J441 Chronic obstructive pulmonary disease with (acute) exacerbation: Secondary | ICD-10-CM | POA: Diagnosis not present

## 2021-11-12 LAB — CBC
HCT: 44.2 % (ref 39.0–52.0)
Hemoglobin: 13.1 g/dL (ref 13.0–17.0)
MCH: 30.5 pg (ref 26.0–34.0)
MCHC: 29.6 g/dL — ABNORMAL LOW (ref 30.0–36.0)
MCV: 103 fL — ABNORMAL HIGH (ref 80.0–100.0)
Platelets: 150 10*3/uL (ref 150–400)
RBC: 4.29 MIL/uL (ref 4.22–5.81)
RDW: 15 % (ref 11.5–15.5)
WBC: 6.9 10*3/uL (ref 4.0–10.5)
nRBC: 0 % (ref 0.0–0.2)

## 2021-11-12 LAB — COMPREHENSIVE METABOLIC PANEL
ALT: 14 U/L (ref 0–44)
AST: 13 U/L — ABNORMAL LOW (ref 15–41)
Albumin: 3.1 g/dL — ABNORMAL LOW (ref 3.5–5.0)
Alkaline Phosphatase: 60 U/L (ref 38–126)
Anion gap: 4 — ABNORMAL LOW (ref 5–15)
BUN: 23 mg/dL (ref 8–23)
CO2: 39 mmol/L — ABNORMAL HIGH (ref 22–32)
Calcium: 8.9 mg/dL (ref 8.9–10.3)
Chloride: 98 mmol/L (ref 98–111)
Creatinine, Ser: 1.34 mg/dL — ABNORMAL HIGH (ref 0.61–1.24)
GFR, Estimated: 55 mL/min — ABNORMAL LOW (ref >60)
Glucose, Bld: 143 mg/dL — ABNORMAL HIGH (ref 70–99)
Potassium: 5.2 mmol/L — ABNORMAL HIGH (ref 3.5–5.1)
Sodium: 141 mmol/L (ref 135–145)
Total Bilirubin: 0.7 mg/dL (ref 0.3–1.2)
Total Protein: 6.2 g/dL — ABNORMAL LOW (ref 6.5–8.1)

## 2021-11-12 LAB — GLUCOSE, CAPILLARY
Glucose-Capillary: 146 mg/dL — ABNORMAL HIGH (ref 70–99)
Glucose-Capillary: 82 mg/dL (ref 70–99)
Glucose-Capillary: 218 mg/dL — ABNORMAL HIGH (ref 70–99)

## 2021-11-12 LAB — ECHOCARDIOGRAM COMPLETE
Area-P 1/2: 5.36 cm2
Height: 75 in
S' Lateral: 2.8 cm
Weight: 2867.74 oz

## 2021-11-12 MED ORDER — IPRATROPIUM-ALBUTEROL 0.5-2.5 (3) MG/3ML IN SOLN
3.0000 mL | Freq: Two times a day (BID) | RESPIRATORY_TRACT | Status: DC
  Administered 2021-11-13 – 2021-11-14 (×3): 3 mL via RESPIRATORY_TRACT
  Filled 2021-11-12 (×3): qty 3

## 2021-11-12 NOTE — Progress Notes (Signed)
Pt has refused cpap for tonight stating the mask is uncomfortable.  RT will cont to monitor. ?

## 2021-11-12 NOTE — Progress Notes (Addendum)
?PROGRESS NOTE ? ? ? ?Manuel [name]  WUJ:[id number] DOB: 05-18-1946 DOA: 11/10/2021 ?PCP: [name], DO  ? ? ?Chief Complaint  ?Patient presents with  ? Shortness of Breath  ? ? ?Brief Narrative:  ? ? ? Manuel [name] is a 76 y.o. male with medical history significant for OSA on CPAP nightly, hypertension, hyperlipidemia, type 2 diabetes, COPD, chronic hypoxia on 2 L nasal cannula at baseline, former tobacco user, quit 05/2021, AAA, history of GI bleed, cirrhosis of liver, CKD, BPH, history of nonbleeding gastric ulcer in 2019, patient with recent hospitalization at [facility] due to pneumonia requiring intubation and ventilatory support and ICU stay, who presented to [facility] ED with complaints of gradually worsening shortness of breath of 1 week duration.  Associated with a productive cough with light-colored phlegm, and generalized weakness.  Patient was recently admitted to the hospital for aspiration pneumonia, was found to have acute hypoxic and hypercarbic respiratory failure requiring intubation, was discharged on 10/01/2021.  Upon presentation to the ED, work-up revealed acute COPD exacerbation with bilateral diffuse wheezing on exam with mild rales at bases.  Notable bilateral lower extremity edema with concern for superimposed heart failure exacerbation.  Chest x-ray showing right-sided pleural effusion and pulm edema.  BNP is equivocal 178.  Patient was started on IV antibiotics, IV steroids and nebulizer breathing treatment in the ED.   ? ?Assessment & Plan: ?  ?Principal Problem: ?  COPD exacerbation ([place]) ?Active Problems: ?  BPH (benign prostatic hyperplasia) ?  DM (diabetes mellitus) ([place]) ?  Primary hypertension ?  Hypercholesteremia ?  Acute on chronic respiratory failure with hypoxia and hypercapnia (HCC) ?  CAP (community acquired pneumonia) ?  Pleural effusion on right ? ? ?COPD exacerbation with concern for superimposed acute on chronic diastolic CHF. ?-He is with worsening dyspnea,  and significant wheezing bilaterally. On admission ?-Continue to treat for COPD exacerbation with IV steroids, scheduled DuoNebs, he was encouraged use incentive spirometry and flutter valve. ?-Wheezing has improved, transition to oral steroids ? ?Presumptive right lower lobe CAP, POA ?Pneumonia cannot be excluded per radiology interpretation of chest x-ray ?Continue IV antibiotics Rocephin 2 g daily, and azithromycin  ?Was encouraged to use incentive spirometry and flutter valve ?Follow on sputum culture, Legionella and strep pneumonia antigen ?  ?Moderate right pleural effusion and concern with mild pulmonary edema ?-Status post thoracentesis 1.6 L drained, he was encouraged use incentive spirometer ?-Significant for exudative pleural effusion with LDH ratio of 0.68, and protein ratio of 0.56 ?-4 Gram stains and cultures with no growth to date ?  ?Acute on chronic hypoxic and hypercarbic respiratory failure secondary to above ?On 2 L nasal cannula at baseline and currently requiring 3 L to maintain oxygen saturation greater than 90%. ?Continue to maintain oxygen saturation greater 90% ?Wean off oxygen supplementation as tolerated ?  ?Acute on chronic HFpEF 50-55% ?- Last 2D echo done in 2019 revealed LVEF 50 to 55% with grade 2 diastolic dysfunction ?-2D echo this admission with a preserved EF 55 to 60% ?-Received IV Lasix on admission, blood pressure remains soft. ?  ?AKI on CKD 2 ?Baseline creatinine appears to be 1.2 with GFR greater than 60 ?Presented with creatinine 1.56 with GFR 46 ?Avoid nephrotoxic agents and hypotension. ?Closely monitor urine output with strict I's and O's. ?  ?Chronic A-fib ?Rate controlled ?Resume home Eliquis for CVA prevention ?Continue to monitor on telemetry ? ?OSA ?Continue CPAP nightly ?  ?Physical debility ?PT OT to assess ?Fall  precautions. ?TOC consulted to assist with DC planning ?  ?  ? ?DVT prophylaxis: on Eliquis ?Code Status: Full ?Family Communication: none at  [place] ?Disposition:  ? ?Status is: Inpatient ?Remains inpatient appropriate because: IV abx and steroids ?  ?Consultants:  ?none ? ? ?Subjective: ?Patient denies any fever, chills, nausea or vomiting, reports dyspnea has improved, reported cough is minimal as well. ? ?Objective: ?Vitals:  ? 11/11/21 2149 11/12/21 0600 11/12/21 0756 11/12/21 1000  ?BP: 98/63 111/81  109/86  ?Pulse:  65  71  ?Resp:  17  20  ?Temp:  97.6 ?F (36.4 ?C)  98 ?F (36.7 ?C)  ?TempSrc:  Oral  Oral  ?SpO2:  (!) 89% 93% 99%  ?Weight:      ?Height:      ? ? ?Intake/Output Summary (Last 24 hours) at 11/12/2021 1108 ?Last data filed at 11/11/2021 1900 ?Gross per 24 hour  ?Intake 240 ml  ?Output --  ?Net 240 ml  ? ?Filed Weights  ? 11/11/21 1900  ?Weight: 81.3 kg  ? ? ?Examination: ? ?Awake Alert, Oriented X 3, No new F.N deficits, frail ?Symmetrical Chest wall movement, right lung base with Rales and rhonchi, diminished air entry at the lower light lung ?RRR,No Gallops,Rubs or new Murmurs, No Parasternal Heave ?+ve B.Sounds, Abd Soft, No tenderness, No rebound - guarding or rigidity. ?No Cyanosis, Clubbing or edema, No new Rash or bruise   ? ? ? ? ? ?Data Reviewed: I have personally reviewed following labs and imaging studies ? ?CBC: ?Recent Labs  ?Lab 11/10/21 ?1949 11/11/21 ?7672 11/12/21 ?0211  ?WBC 2.8* 3.6* 6.9  ?NEUTROABS  --  3.5  --   ?HGB 13.4 12.7* 13.1  ?HCT 45.9 42.6 44.2  ?MCV 103.4* 102.7* 103.0*  ?PLT 171 141* 150  ? ? ? ?Basic Metabolic Panel: ?Recent Labs  ?Lab 11/10/21 ?1949 11/11/21 ?0947 11/12/21 ?0211  ?NA 142 142 141  ?K 4.9 4.2 5.2*  ?CL 99 97* 98  ?CO2 38* 39* 39*  ?GLUCOSE 106* 225* 143*  ?BUN 22 24* 23  ?CREATININE 1.56* 1.60* 1.34*  ?CALCIUM 9.2 8.9 8.9  ?MG  --  2.2  --   ?PHOS  --  4.2  --   ? ? ? ?GFR: ?Estimated Creatinine Clearance: 54.8 mL/min (A) (by C-G formula based on SCr of 1.34 mg/dL (H)). ? ?Liver Function Tests: ?Recent Labs  ?Lab 11/11/21 ?0962 11/11/21 ?1827 11/12/21 ?0211  ?AST 13*  --  13*  ?ALT 12  --   14  ?ALKPHOS 62  --  60  ?BILITOT 0.8  --  0.7  ?PROT 6.0* 6.7 6.2*  ?ALBUMIN 3.0*  --  3.1*  ? ? ? ?CBG: ?Recent Labs  ?Lab 11/11/21 ?0740 11/11/21 ?1159 11/11/21 ?1650 11/11/21 ?1840 11/12/21 ?0907  ?GLUCAP 180* 164* 234* 156* 146*  ? ? ? ? ?Recent Results (from the past 240 hour(s))  ?Resp Panel by RT-PCR (Flu A&B, Covid) Nasopharyngeal Swab     Status: None  ? Collection Time: 11/10/21  7:38 PM  ? Specimen: Nasopharyngeal Swab; Nasopharyngeal(NP) swabs in vial transport medium  ?Result Value Ref Range Status  ? SARS Coronavirus 2 by RT PCR NEGATIVE NEGATIVE Final  ?  Comment: (NOTE) ?SARS-CoV-2 target nucleic acids are NOT DETECTED. ? ?The SARS-CoV-2 RNA is generally detectable in upper respiratory ?specimens during the acute phase of infection. The lowest ?concentration of SARS-CoV-2 viral copies this assay can detect is ?138 copies/mL. A negative result does not preclude SARS-Cov-2 ?infection and should  not be used as the sole basis for treatment or ?other patient management decisions. A negative result may occur with  ?improper specimen collection/handling, submission of specimen other ?than nasopharyngeal swab, presence of viral mutation(s) within the ?areas targeted by this assay, and inadequate number of viral ?copies(<138 copies/mL). A negative result must be combined with ?clinical observations, patient history, and epidemiological ?information. The expected result is Negative. ? ?Fact Sheet for Patients:  ?EntrepreneurPulse.com.au ? ?Fact Sheet for Healthcare Providers:  ?IncredibleEmployment.be ? ?This test is no t yet approved or cleared by the [place] FDA and  ?has been authorized for detection and/or diagnosis of SARS-CoV-2 by ?FDA under an Emergency Use Authorization (EUA). This EUA will remain  ?in effect (meaning this test can be used) for the duration of the ?COVID-19 declaration under Section 564(b)(1) of the Act, 21 ?U.S.C.section 360bbb-3(b)(1), unless  the authorization is terminated  ?or revoked sooner.  ? ? ?  ? Influenza A by PCR NEGATIVE NEGATIVE Final  ? Influenza B by PCR NEGATIVE NEGATIVE Final  ?  Comment: (NOTE) ?The Xpert Xpress SARS-CoV-2/FLU/RSV pl

## 2021-11-12 NOTE — Evaluation (Signed)
Physical Therapy Evaluation ?Patient Details ?Name: Manuel [name] ?MRN: [id number] ?DOB: 1945/11/24 ?Today's Date: 11/12/2021 ? ?History of Present Illness ? Manuel [name] is a 76 y.o. male with medical history significant for OSA on CPAP nightly, hypertension, hyperlipidemia, type 2 diabetes, COPD, chronic hypoxia on 2 L nasal cannula at baseline, former tobacco user, AAA, history of GI bleed, cirrhosis of liver, CKD, BPH, history of nonbleeding gastric ulcer. Admitted with COPD exacerbation, CAP, and acute on chronic CHF 4/21.  ?Clinical Impression ? Pt admitted with above diagnosis. Ambulates well, assist with managing lines and leads only today, SpO2 seemed to avg >95% on 2L supplemental O2 when waveform was briefly available. No DOE. No complaints of SOB. Adequate for D/c from PT standpoint once medially able. Encouraged frequent mobility and time OOB. Will follow and monitor for any status changes with functional mobility. Pt currently with functional limitations due to the deficits listed below (see PT Problem List). Pt will benefit from skilled PT to increase their independence and safety with mobility to allow discharge to the venue listed below.   ?   ?   ? ?Recommendations for follow up therapy are one component of a multi-disciplinary discharge planning process, led by the attending physician.  Recommendations may be updated based on patient status, additional functional criteria and insurance authorization. ? ?Follow Up Recommendations No PT follow up ? ?  ?Assistance Recommended at Discharge PRN  ?Patient can return home with the following ? Assist for transportation ? ?  ?Equipment Recommendations None recommended by PT  ?Recommendations for Other Services ?    ?  ?Functional Status Assessment Patient has had a recent decline in their functional status and demonstrates the ability to make significant improvements in function in a reasonable and predictable amount of time.  ? ?  ?Precautions /  Restrictions Precautions ?Precaution Comments: Monitor O2 ?Restrictions ?Weight Bearing Restrictions: No  ? ?  ? ?Mobility ? Bed Mobility ?Overal bed mobility: Modified Independent ?  ?  ?  ?  ?  ?  ?General bed mobility comments: extra time ?  ? ?Transfers ?Overall transfer level: Modified independent ?Equipment used: None ?  ?  ?  ?  ?  ?  ?  ?General transfer comment: Performed safely without AD ?  ? ?Ambulation/Gait ?Ambulation/Gait assistance: Supervision ?Gait Distance (Feet): 150 Feet ?Assistive device: None ?Gait Pattern/deviations: Step-through pattern, Drifts right/left ?  ?  ?  ?General Gait Details: Supervision for safety, managed lines/leads, SpO2 fluctuating with poor waveform but seemed to maintain >95% on 2L on avg, with no DOE or reported breathlesness. Mild sway from straight path at times but able to self correct without PT assistance. ? ?Stairs ?Stairs: Yes ?Stairs assistance: Supervision ?Stair Management: One rail Right, Forwards ?Number of Stairs: 5 ?General stair comments: Navigates stairs safely. therapist supervision for safety and to manage lines/leads. ? ?Wheelchair Mobility ?  ? ?Modified Rankin (Stroke Patients Only) ?  ? ?  ? ?Balance Overall balance assessment: Mild deficits observed, not formally tested ?  ?  ?  ?  ?  ?  ?  ?  ?  ?  ?  ?  ?  ?  ?  ?  ?  ?  ?   ? ? ? ?Pertinent Vitals/Pain Pain Assessment ?Pain Assessment: No/denies pain  ? ? ?Home Living Family/patient expects to be discharged to:: Private residence ?Living Arrangements: Spouse/significant other;Children ?Available Help at Discharge: Family;Available 24 hours/day ?Type of Home: House ?Home Access: Stairs to  enter ?Entrance Stairs-Rails: Right ?  ?  ?Home Layout: Two level;Full bath on main level;Able to live on main level with bedroom/bathroom ?Home Equipment: Kasandra Knudsen - single point ?   ?  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  ?  ?  ?  ?  ?  ?  ?ADLs Comments: wife does the cooking ?  ? ? ?Hand Dominance  ? Dominant Hand: Right ? ?  ?Extremity/Trunk Assessment  ? Upper Extremity Assessment ?Upper Extremity Assessment: Defer to OT evaluation ?  ? ?Lower Extremity Assessment ?Lower Extremity Assessment: Overall WFL for tasks assessed ?  ? ?   ?Communication  ? Communication: No difficulties  ?Cognition Arousal/Alertness: Awake/alert ?Behavior During Therapy: [facility] for tasks assessed/performed ?Overall Cognitive Status: Within Functional Limits for tasks assessed ?  ?  ?  ?  ?  ?  ?  ?  ?  ?  ?  ?  ?  ?  ?  ?  ?  ?  ?  ? ?  ?General Comments General comments (skin integrity, edema, etc.): SpO2 at rest on 3L 100%, on 2L down to 84% (however poor waveform noted.) While ambulating SpO2 seemed to maintain >95% on 2L when readings were briefly available. ? ?  ?Exercises    ? ?Assessment/Plan  ?  ?PT Assessment Patient needs continued PT services  ?PT Problem List Decreased activity tolerance;Decreased mobility;Cardiopulmonary status limiting activity ? ?   ?  ?PT Treatment Interventions DME instruction;Gait training;Stair training;Therapeutic activities;Functional mobility training;Therapeutic exercise;Balance training;Neuromuscular re-education   ? ?PT Goals (Current goals can be found in the Care Plan section)  ?Acute Rehab PT Goals ?Patient Stated Goal: Go home ?PT Goal Formulation: With patient ?Time For Goal Achievement: 11/26/21 ?Potential to Achieve Goals: Good ? ?  ?Frequency Min 3X/week ?  ? ? ?Co-evaluation   ?  ?  ?  ?  ? ? ?  ?AM-PAC PT "6 Clicks" Mobility  ?Outcome Measure Help needed turning from your back to your side while in a flat bed without using bedrails?: None ?Help needed moving from lying on your back to sitting on the side of a flat bed without using bedrails?: None ?Help needed moving to and from a bed to a chair (including a wheelchair)?: None ?Help needed standing up from a chair using your  arms (e.g., wheelchair or bedside chair)?: None ?Help needed to walk in hospital room?: None ?Help needed climbing 3-5 steps with a railing? : None ?6 Click Score: 24 ? ?  ?End of Session Equipment Utilized During Treatment: Oxygen;Gait belt ?Activity Tolerance: Patient tolerated treatment well ?Patient left: in chair;with call bell/phone within reach;with nursing/sitter in room ?Nurse Communication: Other (comment);Mobility status (SpO2 levels) ?PT Visit Diagnosis: Unsteadiness on feet (R26.81);Other abnormalities of gait and mobility (R26.89) ?  ? ?Time: 1561-5379 ?PT Time Calculation (min) (ACUTE ONLY): 33 min ? ? ?Charges:   PT Evaluation ?$PT Eval Low Complexity: 1 Low ?PT Treatments ?$Gait Training: 8-22 mins ?  ?   ? ? ?Candie Mile, PT ? ? ?Ellouise Newer ?11/12/2021, 9:22 AM ?

## 2021-11-12 NOTE — Plan of Care (Signed)
?  Problem: Education: Goal: Knowledge of General Education information will improve Description: Including pain rating scale, medication(s)/side effects and non-pharmacologic comfort measures Outcome: Progressing   Problem: Health Behavior/Discharge Planning: Goal: Ability to manage health-related needs will improve Outcome: Progressing   Problem: Clinical Measurements: Goal: Ability to maintain clinical measurements within normal limits will improve Outcome: Progressing   

## 2021-11-12 NOTE — Progress Notes (Signed)
?  Echocardiogram ?2D Echocardiogram has been performed. ? Manuel [name] ?11/12/2021, 10:54 AM ?

## 2021-11-12 NOTE — Evaluation (Signed)
Occupational Therapy Evaluation ?Patient Details ?Name: Manuel [name] ?MRN: [id number] ?DOB: 1946-04-03 ?Today's Date: 11/12/2021 ? ? ?History of Present Illness [name] is a 76 y.o. male with medical history significant for OSA on CPAP nightly, hypertension, hyperlipidemia, type 2 diabetes, COPD, chronic hypoxia on 2 L nasal cannula at baseline, former tobacco user, AAA, history of GI bleed, cirrhosis of liver, CKD, BPH, history of nonbleeding gastric ulcer. Admitted with COPD exacerbation, CAP, and acute on chronic CHF 4/21.  ? ?Clinical Impression ?  ?Pt reports independence at baseline with ADLs and functional mobility, lives with family who can assist at d/c. Pt currently at a supervision level for ADLs, mod I for bed mobility and transfers without AD use. VSS On 4L O2 during session, although poor wave pleth noted, RN aware. Pt presenting with impairments listed below, will follow acutely. Anticipate no OT follow up at d/c pending pt progress. ?   ? ?Recommendations for follow up therapy are one component of a multi-disciplinary discharge planning process, led by the attending physician.  Recommendations may be updated based on patient status, additional functional criteria and insurance authorization.  ? ?Follow Up Recommendations ? No OT follow up  ?  ?Assistance Recommended at Discharge Set up Supervision/Assistance  ?Patient can return home with the following A little help with walking and/or transfers;A little help with bathing/dressing/bathroom;Assistance with cooking/housework;Help with stairs or ramp for entrance ? ?  ?Functional Status Assessment ? Patient has had a recent decline in their functional status and demonstrates the ability to make significant improvements in function in a reasonable and predictable amount of time.  ?Equipment Recommendations ? Tub/shower seat  ?  ?Recommendations for Other Services PT consult ? ? ?  ?Precautions / Restrictions Precautions ?Precautions:  Fall ?Precaution Comments: Monitor O2 ?Restrictions ?Weight Bearing Restrictions: No  ? ?  ? ?Mobility Bed Mobility ?Overal bed mobility: Modified Independent ?  ?  ?  ?  ?  ?  ?  ?  ? ?Transfers ?Overall transfer level: Modified independent ?Equipment used: None ?  ?  ?  ?  ?  ?  ?  ?  ?  ? ?  ?Balance Overall balance assessment: Mild deficits observed, not formally tested ?  ?  ?  ?  ?  ?  ?  ?  ?  ?  ?  ?  ?  ?  ?  ?  ?  ?  ?   ? ?ADL either performed or assessed with clinical judgement  ? ?ADL Overall ADL's : Needs assistance/impaired ?Eating/Feeding: Set up;Sitting ?  ?Grooming: Wash/dry face;Standing ?Grooming Details (indicate cue type and reason): completed standing at sink ?Upper Body Bathing: Sitting;Supervision/ safety ?  ?Lower Body Bathing: Sitting/lateral leans;Supervison/ safety ?  ?Upper Body Dressing : Supervision/safety;Sitting ?  ?Lower Body Dressing: Supervision/safety;Sitting/lateral leans;Sit to/from stand ?Lower Body Dressing Details (indicate cue type and reason): to pull up  socks ?Toilet Transfer: Min guard ?Toilet Transfer Details (indicate cue type and reason): simulated in room ?Toileting- Clothing Manipulation and Hygiene: Supervision/safety;Sit to/from stand;Sitting/lateral lean ?  ?  ?  ?Functional mobility during ADLs: Min guard ?   ? ? ? ?Vision Baseline Vision/History: 1 Wears glasses ?Vision Assessment?: No apparent visual deficits  ?   ?Perception   ?  ?Praxis   ?  ? ?Pertinent Vitals/Pain Pain Assessment ?Pain Assessment: No/denies pain  ? ? ? ?Hand Dominance   ?  ?Extremity/Trunk Assessment Upper Extremity Assessment ?Upper Extremity Assessment: Overall WFL for tasks assessed ?  ?  Lower Extremity Assessment ?Lower Extremity Assessment: Defer to PT evaluation ?  ?Cervical / Trunk Assessment ?Cervical / Trunk Assessment: Normal ?  ?Communication Communication ?Communication: No difficulties ?  ?Cognition Arousal/Alertness: Awake/alert ?Behavior During Therapy: [facility] for tasks  assessed/performed ?Overall Cognitive Status: Within Functional Limits for tasks assessed ?  ?  ?  ?  ?  ?  ?  ?  ?  ?  ?  ?  ?  ?  ?  ?  ?  ?  ?  ?General Comments  VSS on 4L o2, poor wave pleth, pt asymptomatic ? ?  ?Exercises   ?  ?Shoulder Instructions    ? ? ?Home Living Family/patient expects to be discharged to:: Private residence ?Living Arrangements: Spouse/significant other;Children ?Available Help at Discharge: Family;Available 24 hours/day ?Type of Home: House ?Home Access: Stairs to enter ?Entrance Stairs-Number of Steps: 4 ?Entrance Stairs-Rails: Right ?Home Layout: Two level;Full bath on main level;Able to live on main level with bedroom/bathroom ?  ?  ?Bathroom Shower/Tub: Tub/shower unit ?  ?  ?  ?  ?Home Equipment: Kasandra Knudsen - single point ?  ?Additional Comments: has been using O2 the last 2 weeks; 2L; reports using as needed ?  ? ?  ?Prior Functioning/Environment Prior Level of Function : Independent/Modified Independent;Working/employed;Other (comment) ?  ?  ?  ?  ?  ?  ?Mobility Comments: no AD use ?ADLs Comments: does IADLs ?  ? ?  ?  ?OT Problem List: Decreased strength;Decreased range of motion;Decreased activity tolerance;Impaired balance (sitting and/or standing) ?  ?   ?OT Treatment/Interventions: Self-care/ADL training;Therapeutic exercise;Therapeutic activities;Patient/family education;Balance training  ?  ?OT Goals(Current goals can be found in the care plan section) Acute Rehab OT Goals ?Patient Stated Goal: none stated ?OT Goal Formulation: With patient ?Time For Goal Achievement: 11/26/21 ?Potential to Achieve Goals: Good ?ADL Goals ?Pt Will Perform Upper Body Dressing: with modified independence;sitting;standing ?Pt Will Perform Lower Body Dressing: with modified independence;sitting/lateral leans;sit to/from stand ?Pt Will Transfer to Toilet: with modified independence;ambulating;regular height toilet ?Pt Will Perform Tub/Shower Transfer: Shower transfer;Tub transfer;with modified  independence;ambulating;shower seat  ?OT Frequency: Min 3X/week ?  ? ?Co-evaluation   ?  ?  ?  ?  ? ?  ?AM-PAC OT "6 Clicks" Daily Activity     ?Outcome Measure Help from another person eating meals?: None ?Help from another person taking care of personal grooming?: None ?Help from another person toileting, which includes using toliet, bedpan, or urinal?: None ?Help from another person bathing (including washing, rinsing, drying)?: A Little ?Help from another person to put on and taking off regular upper body clothing?: A Little ?Help from another person to put on and taking off regular lower body clothing?: A Little ?6 Click Score: 21 ?  ?End of Session Equipment Utilized During Treatment: Gait belt;Oxygen ?Nurse Communication: Mobility status (poor wave form with SpO2 sensor) ? ?Activity Tolerance: Patient tolerated treatment well ?Patient left: in chair;with call bell/phone within reach;with chair alarm set ? ?OT Visit Diagnosis: Unsteadiness on feet (R26.81);Other abnormalities of gait and mobility (R26.89);Muscle weakness (generalized) (M62.81)  ?              ?Time: 7366-8159 ?OT Time Calculation (min): 34 min ?Charges:  OT General Charges ?$OT Visit: 1 Visit ?OT Evaluation ?$OT Eval Low Complexity: 1 Low ?OT Treatments ?$Self Care/Home Management : 8-22 mins ? ?Lynnda Child, OTD, OTR/L ?Acute Rehab ?[phone] ? ?[name] ?11/12/2021, 12:45 PM ?

## 2021-11-13 DIAGNOSIS — J441 Chronic obstructive pulmonary disease with (acute) exacerbation: Secondary | ICD-10-CM | POA: Diagnosis not present

## 2021-11-13 DIAGNOSIS — J9622 Acute and chronic respiratory failure with hypercapnia: Secondary | ICD-10-CM | POA: Diagnosis not present

## 2021-11-13 DIAGNOSIS — J189 Pneumonia, unspecified organism: Secondary | ICD-10-CM | POA: Diagnosis not present

## 2021-11-13 DIAGNOSIS — J9621 Acute and chronic respiratory failure with hypoxia: Secondary | ICD-10-CM | POA: Diagnosis not present

## 2021-11-13 LAB — BASIC METABOLIC PANEL
Anion gap: 4 — ABNORMAL LOW (ref 5–15)
BUN: 28 mg/dL — ABNORMAL HIGH (ref 8–23)
CO2: 38 mmol/L — ABNORMAL HIGH (ref 22–32)
Calcium: 8.5 mg/dL — ABNORMAL LOW (ref 8.9–10.3)
Chloride: 98 mmol/L (ref 98–111)
Creatinine, Ser: 1.4 mg/dL — ABNORMAL HIGH (ref 0.61–1.24)
GFR, Estimated: 52 mL/min — ABNORMAL LOW (ref >60)
Glucose, Bld: 180 mg/dL — ABNORMAL HIGH (ref 70–99)
Potassium: 4.8 mmol/L (ref 3.5–5.1)
Sodium: 140 mmol/L (ref 135–145)

## 2021-11-13 LAB — CBC
HCT: 43.6 % (ref 39.0–52.0)
Hemoglobin: 13 g/dL (ref 13.0–17.0)
MCH: 30.4 pg (ref 26.0–34.0)
MCHC: 29.8 g/dL — ABNORMAL LOW (ref 30.0–36.0)
MCV: 102.1 fL — ABNORMAL HIGH (ref 80.0–100.0)
Platelets: 140 10*3/uL — ABNORMAL LOW (ref 150–400)
RBC: 4.27 MIL/uL (ref 4.22–5.81)
RDW: 15 % (ref 11.5–15.5)
WBC: 7.1 10*3/uL (ref 4.0–10.5)
nRBC: 0 % (ref 0.0–0.2)

## 2021-11-13 LAB — GLUCOSE, CAPILLARY
Glucose-Capillary: 89 mg/dL (ref 70–99)
Glucose-Capillary: 94 mg/dL (ref 70–99)
Glucose-Capillary: 200 mg/dL — ABNORMAL HIGH (ref 70–99)
Glucose-Capillary: 179 mg/dL — ABNORMAL HIGH (ref 70–99)

## 2021-11-13 MED ORDER — ENSURE ENLIVE PO LIQD
237.0000 mL | Freq: Two times a day (BID) | ORAL | Status: DC
  Administered 2021-11-14: 237 mL via ORAL

## 2021-11-13 MED ORDER — PREDNISONE 20 MG PO TABS
40.0000 mg | ORAL_TABLET | Freq: Every day | ORAL | Status: DC
  Administered 2021-11-14: 40 mg via ORAL
  Filled 2021-11-13: qty 2

## 2021-11-13 NOTE — Progress Notes (Signed)
Initial Nutrition Assessment ? ?DOCUMENTATION CODES:  ? ?Not applicable ? ?INTERVENTION:  ?Provide Ensure Enlive po BID, each supplement provides 350 kcal and 20 grams of protein. ? ?Encourage adequate PO intake.  ? ?NUTRITION DIAGNOSIS:  ? ?Increased nutrient needs related to chronic illness (COPD) as evidenced by estimated needs. ? ?GOAL:  ? ?Patient will meet greater than or equal to 90% of their needs ? ?MONITOR:  ? ?PO intake, Supplement acceptance, Weight trends, Labs, Skin, I & O's ? ?REASON FOR ASSESSMENT:  ? ?Malnutrition Screening Tool ?  ? ?ASSESSMENT:  ? ?76 y.o. male with medical history significant for OSA on CPAP nightly, hypertension, hyperlipidemia, type 2 diabetes, COPD, chronic hypoxia on 2 L nasal cannula at baseline, AAA, cirrhosis of liver, CKD presents with shortness of breath. Pt with acute COPD exacerbation. Notable bilateral lower extremity edema with concern for superimposed heart failure exacerbation. Chest x-ray showing right-sided pleural effusion and pulm edema. ? ?Meal completion has been 75%. Pt reports having a good appetite currently and prior to admission with usual consumption of at least 3 meals a day with Ensure twice daily. Pt does report gradual weight loss over the past 6 months. Per weight records, pt with a 12% weight loss over the past 2 months, significant for time frame, however may be related to fluid status. RD to order nutritional supplements to aid in caloric and protein needs. ? ?NUTRITION - FOCUSED PHYSICAL EXAM: ? ?Flowsheet Row Most Recent Value  ?Orbital Region Unable to assess  ?Upper Arm Region Unable to assess  ?Thoracic and Lumbar Region No depletion  ?Buccal Region Unable to assess  ?Temple Region Unable to assess  ?Clavicle Bone Region Severe depletion  ?Clavicle and Acromion Bone Region Severe depletion  ?Scapular Bone Region Unable to assess  ?Dorsal Hand Unable to assess  ?Patellar Region Moderate depletion  ?Anterior Thigh Region Moderate depletion   ?Posterior Calf Region Moderate depletion  ?Edema (RD Assessment) Mild  ?Hair Reviewed  ?Eyes Reviewed  ?Mouth Reviewed  ?Skin Reviewed  ?Nails Reviewed  ? ?  ? ?Labs and medications reviewed.  ? ?Diet Order:   ?Diet Order   ? ?       ?  Diet heart healthy/carb modified Room service appropriate? Yes; Fluid consistency: Thin  Diet effective now       ?  ? ?  ?  ? ?  ? ? ?EDUCATION NEEDS:  ? ?Not appropriate for education at this time ? ?Skin:  Skin Assessment: Reviewed RN Assessment ? ?Last BM:  4/23 ? ?Height:  ? ?Ht Readings from Last 1 Encounters:  ?11/11/21 _0  (1.905 m)  ? ? ?Weight:  ? ?Wt Readings from Last 1 Encounters:  ?11/11/21 81.3 kg  ? ?BMI:  Body mass index is 22.4 kg/m?. ? ?Estimated Nutritional Needs:  ? ?Kcal:  2000-2200 ? ?Protein:  100-110 grams ? ?Fluid:  2 L/day ? ?Corrin Parker, MS, RD, LDN ?RD pager number/after hours weekend pager number on Amion. ? ?

## 2021-11-13 NOTE — Progress Notes (Signed)
Pt refusing cpap for the night. ?

## 2021-11-13 NOTE — Progress Notes (Signed)
Physical Therapy Treatment ?Patient Details ?Name: Manuel [name] ?MRN: [id number] ?DOB: 1946/03/02 ?Today's Date: 11/13/2021 ? ? ?History of Present Illness Manuel [name] is a 76 y.o. male with medical history significant for OSA on CPAP nightly, hypertension, hyperlipidemia, type 2 diabetes, COPD, chronic hypoxia on 2 L nasal cannula at baseline, former tobacco user, AAA, history of GI bleed, cirrhosis of liver, CKD, BPH, history of nonbleeding gastric ulcer. Admitted with COPD exacerbation, CAP, and acute on chronic CHF 4/21. ? ?  ?PT Comments  ? ? Pt received supine and agreeable to session. Pt continues to ambulate well with assist for lines/leads only for further distance this session with some unsteadiness but no LOB and pt able to self correct in all instances. Pt without DOE and no c/o shortness fo breath. O2 sats variable throughout with very poor waveform, when wave available O2 sats >90% on 2L [place]. Placed new sensor on ear at end of session with good return and better waveform, O2 >93% at EOS. RN notified. Pt continues to benefit from skilled PT services to progress toward functional mobility goals and safe return home.  ?   ?Recommendations for follow up therapy are one component of a multi-disciplinary discharge planning process, led by the attending physician.  Recommendations may be updated based on patient status, additional functional criteria and insurance authorization. ? ?Follow Up Recommendations ? No PT follow up ?  ?  ?Assistance Recommended at Discharge PRN  ?Patient can return home with the following Assist for transportation ?  ?Equipment Recommendations ? None recommended by PT  ?  ?Recommendations for Other Services   ? ? ?  ?Precautions / Restrictions Precautions ?Precautions: Fall ?Precaution Comments: Monitor O2 ?Restrictions ?Weight Bearing Restrictions: No  ?  ? ?Mobility ? Bed Mobility ?Overal bed mobility: Modified Independent ?  ?  ?  ?  ?  ?  ?General bed mobility comments: extra  time ?  ? ?Transfers ?Overall transfer level: Modified independent ?Equipment used: None ?  ?  ?  ?  ?  ?  ?  ?General transfer comment: Performed safely without AD ?  ? ?Ambulation/Gait ?Ambulation/Gait assistance: Supervision ?Gait Distance (Feet): 430 Feet [phone]) ?Assistive device: None ?Gait Pattern/deviations: Step-through pattern, Drifts right/left ?  ?  ?  ?General Gait Details: Supervision for safety, managed lines/leads, SpO2 fluctuating with very poor waveform with no DOE or reported SOB. Mild sway from straight path at times but able to self correct without PT assistance, new sensor placed on ear, good wave reading >92% on 2L seated at end of session ? ? ?Stairs ?  ?  ?  ?  ?  ? ? ?Wheelchair Mobility ?  ? ?Modified Rankin (Stroke Patients Only) ?  ? ? ?  ?Balance Overall balance assessment: Mild deficits observed, not formally tested ?  ?  ?  ?  ?  ?  ?  ?  ?  ?  ?  ?  ?  ?  ?  ?  ?  ?  ?  ? ?  ?Cognition Arousal/Alertness: Awake/alert ?Behavior During Therapy: [facility] for tasks assessed/performed ?Overall Cognitive Status: Within Functional Limits for tasks assessed ?  ?  ?  ?  ?  ?  ?  ?  ?  ?  ?  ?  ?  ?  ?  ?  ?  ?  ?  ? ?  ?Exercises   ? ?  ?General Comments General comments (skin integrity, edema, etc.): SpO2 at rest  on 2L 95%, on 2L down to high 70s% briefly and returning to 90s (however very poor waveform noted and no s/sx.) new sensor applied to ear at end of session at pt hands cold with good reading and waveform >95% ?  ?  ? ?Pertinent Vitals/Pain Pain Assessment ?Pain Assessment: No/denies pain  ? ? ?Home Living   ?  ?  ?  ?  ?  ?  ?  ?  ?  ?   ?  ?Prior Function    ?  ?  ?   ? ?PT Goals (current goals can now be found in the care plan section) Acute Rehab PT Goals ?Patient Stated Goal: Go home ?PT Goal Formulation: With patient ?Time For Goal Achievement: 11/26/21 ? ?  ?Frequency ? ? ? Min 3X/week ? ? ? ?  ?PT Plan    ? ? ?Co-evaluation   ?  ?  ?  ?  ? ?  ?AM-PAC PT "6 Clicks" Mobility    ?Outcome Measure ? Help needed turning from your back to your side while in a flat bed without using bedrails?: None ?Help needed moving from lying on your back to sitting on the side of a flat bed without using bedrails?: None ?Help needed moving to and from a bed to a chair (including a wheelchair)?: None ?Help needed standing up from a chair using your arms (e.g., wheelchair or bedside chair)?: None ?Help needed to walk in hospital room?: None ?Help needed climbing 3-5 steps with a railing? : None ?6 Click Score: 24 ? ?  ?End of Session Equipment Utilized During Treatment: Oxygen;Gait belt ?Activity Tolerance: Patient tolerated treatment well ?Patient left: in chair;with call bell/phone within reach;with chair alarm set ?Nurse Communication: Other (comment);Mobility status (SpO2 levels) ?PT Visit Diagnosis: Unsteadiness on feet (R26.81);Other abnormalities of gait and mobility (R26.89) ?  ? ? ?Time: 1995-7900 ?PT Time Calculation (min) (ACUTE ONLY): 29 min ? ?Charges:  $Gait Training: 8-22 mins ?$Therapeutic Activity: 8-22 mins          ?          ? ?Audry Riles. PTA ?Acute Rehabilitation Services ?Office: [phone] ? ? ? ?Betsey Holiday Patirica Longshore ?11/13/2021, 10:31 AM ? ?

## 2021-11-13 NOTE — Progress Notes (Signed)
?PROGRESS NOTE ? ? ? ?Manuel [name]  WUJ:[id number] DOB: 05-18-1946 DOA: 11/10/2021 ?PCP: [name], DO  ? ? ?Chief Complaint  ?Patient presents with  ? Shortness of Breath  ? ? ?Brief Narrative:  ? ? ? Manuel [name] is a 76 y.o. male with medical history significant for OSA on CPAP nightly, hypertension, hyperlipidemia, type 2 diabetes, COPD, chronic hypoxia on 2 L nasal cannula at baseline, former tobacco user, quit 05/2021, AAA, history of GI bleed, cirrhosis of liver, CKD, BPH, history of nonbleeding gastric ulcer in 2019, patient with recent hospitalization at [facility] due to pneumonia requiring intubation and ventilatory support and ICU stay, who presented to [facility] ED with complaints of gradually worsening shortness of breath of 1 week duration.  Associated with a productive cough with light-colored phlegm, and generalized weakness.  Patient was recently admitted to the hospital for aspiration pneumonia, was found to have acute hypoxic and hypercarbic respiratory failure requiring intubation, was discharged on 10/01/2021.  Upon presentation to the ED, work-up revealed acute COPD exacerbation with bilateral diffuse wheezing on exam with mild rales at bases.  Notable bilateral lower extremity edema with concern for superimposed heart failure exacerbation.  Chest x-ray showing right-sided pleural effusion and pulm edema.  BNP is equivocal 178.  Patient was started on IV antibiotics, IV steroids and nebulizer breathing treatment in the ED.   ? ?Assessment & Plan: ?  ?Principal Problem: ?  COPD exacerbation ([place]) ?Active Problems: ?  BPH (benign prostatic hyperplasia) ?  DM (diabetes mellitus) ([place]) ?  Primary hypertension ?  Hypercholesteremia ?  Acute on chronic respiratory failure with hypoxia and hypercapnia (HCC) ?  CAP (community acquired pneumonia) ?  Pleural effusion on right ? ? ?COPD exacerbation with concern for superimposed acute on chronic diastolic CHF. ?-He is with worsening dyspnea,  and significant wheezing bilaterally. On admission ?-Continue to treat for COPD exacerbation with IV steroids, scheduled DuoNebs, he was encouraged use incentive spirometry and flutter valve. ?-Wheezing has improved, transition to oral steroids ? ?Presumptive right lower lobe CAP, POA ?Pneumonia cannot be excluded per radiology interpretation of chest x-ray ?Continue IV antibiotics Rocephin 2 g daily, and azithromycin  ?Was encouraged to use incentive spirometry and flutter valve ?Follow on sputum culture, Legionella and strep pneumonia antigen ?  ?Moderate right pleural effusion and concern with mild pulmonary edema ?-Status post thoracentesis 1.6 L drained, he was encouraged use incentive spirometer ?-Significant for exudative pleural effusion with LDH ratio of 0.68, and protein ratio of 0.56 ?-4 Gram stains and cultures with no growth to date ?  ?Acute on chronic hypoxic and hypercarbic respiratory failure secondary to above ?On 2 L nasal cannula at baseline and currently requiring 3 L to maintain oxygen saturation greater than 90%. ?Continue to maintain oxygen saturation greater 90% ?Wean off oxygen supplementation as tolerated ?  ?Acute on chronic HFpEF 50-55% ?- Last 2D echo done in 2019 revealed LVEF 50 to 55% with grade 2 diastolic dysfunction ?-2D echo this admission with a preserved EF 55 to 60% ?-Received IV Lasix on admission, blood pressure remains soft. ?  ?AKI on CKD 2 ?Baseline creatinine appears to be 1.2 with GFR greater than 60 ?Presented with creatinine 1.56 with GFR 46 ?Avoid nephrotoxic agents and hypotension. ?Closely monitor urine output with strict I's and O's. ?  ?Chronic A-fib ?Rate controlled ?Resume home Eliquis for CVA prevention ?Continue to monitor on telemetry ? ?OSA ?Continue CPAP nightly ?  ?Physical debility ?PT OT to assess ?Fall  precautions. ?TOC consulted to assist with DC planning ?  ?  ? ?DVT prophylaxis: on Eliquis ?Code Status: Full ?Family Communication: none at  [place] ?Disposition:  ? ?Status is: Inpatient ?Remains inpatient appropriate because: IV abx and steroids ?  ?Consultants:  ?none ? ? ?Subjective: ?Patient denies any fever, chills, nausea or vomiting, reports dyspnea has improved, reported cough is minimal as well. ? ?Objective: ?Vitals:  ? 11/12/21 2109 11/13/21 0344 11/13/21 0815 11/13/21 4825  ?BP: 113/89 (!) 103/53  121/78  ?Pulse: (!) 50 90  81  ?Resp: _0 ?Temp: (!) 97.4 ?F (36.3 ?C) 98.9 ?F (37.2 ?C)  97.9 ?F (36.6 ?C)  ?TempSrc: Oral     ?SpO2: 95% 99% 92%   ?Weight:      ?Height:      ? ?No intake or output data in the 24 hours ending 11/13/21 1256 ? ?Filed Weights  ? 11/11/21 1900  ?Weight: 81.3 kg  ? ? ?Examination: ? ?Awake Alert, Oriented X 3, No new F.N deficits, frail ?Symmetrical Chest wall movement, right lung base with Rales and rhonchi, diminished air entry at the lower light lung ?RRR,No Gallops,Rubs or new Murmurs, No Parasternal Heave ?+ve B.Sounds, Abd Soft, No tenderness, No rebound - guarding or rigidity. ?No Cyanosis, Clubbing or edema, No new Rash or bruise   ? ? ? ? ? ?Data Reviewed: I have personally reviewed following labs and imaging studies ? ?CBC: ?Recent Labs  ?Lab 11/10/21 ?1949 11/11/21 ?0037 11/12/21 ?0211 11/13/21 ?0102  ?WBC 2.8* 3.6* 6.9 7.1  ?NEUTROABS  --  3.5  --   --   ?HGB 13.4 12.7* 13.1 13.0  ?HCT 45.9 42.6 44.2 43.6  ?MCV 103.4* 102.7* 103.0* 102.1*  ?PLT 171 141* 150 140*  ? ? ? ?Basic Metabolic Panel: ?Recent Labs  ?Lab 11/10/21 ?1949 11/11/21 ?0488 11/12/21 ?0211 11/13/21 ?0102  ?NA 142 142 141 140  ?K 4.9 4.2 5.2* 4.8  ?CL 99 97* 98 98  ?CO2 38* 39* 39* 38*  ?GLUCOSE 106* 225* 143* 180*  ?BUN 22 24* 23 28*  ?CREATININE 1.56* 1.60* 1.34* 1.40*  ?CALCIUM 9.2 8.9 8.9 8.5*  ?MG  --  2.2  --   --   ?PHOS  --  4.2  --   --   ? ? ? ?GFR: ?Estimated Creatinine Clearance: 52.4 mL/min (A) (by C-G formula based on SCr of 1.4 mg/dL (H)). ? ?Liver Function Tests: ?Recent Labs  ?Lab 11/11/21 ?8916 11/11/21 ?1827  11/12/21 ?0211  ?AST 13*  --  13*  ?ALT 12  --  14  ?ALKPHOS 62  --  60  ?BILITOT 0.8  --  0.7  ?PROT 6.0* 6.7 6.2*  ?ALBUMIN 3.0*  --  3.1*  ? ? ? ?CBG: ?Recent Labs  ?Lab 11/12/21 ?0907 11/12/21 ?1233 11/12/21 ?1703 11/13/21 ?0725 11/13/21 ?1128  ?GLUCAP 146* 82 218* 89 94  ? ? ? ? ?Recent Results (from the past 240 hour(s))  ?Resp Panel by RT-PCR (Flu A&B, Covid) Nasopharyngeal Swab     Status: None  ? Collection Time: 11/10/21  7:38 PM  ? Specimen: Nasopharyngeal Swab; Nasopharyngeal(NP) swabs in vial transport medium  ?Result Value Ref Range Status  ? SARS Coronavirus 2 by RT PCR NEGATIVE NEGATIVE Final  ?  Comment: (NOTE) ?SARS-CoV-2 target nucleic acids are NOT DETECTED. ? ?The SARS-CoV-2 RNA is generally detectable in upper respiratory ?specimens during the acute phase of infection. The lowest ?concentration of SARS-CoV-2 viral copies this assay can detect is ?  138 copies/mL. A negative result does not preclude SARS-Cov-2 ?infection and should not be used as the sole basis for treatment or ?other patient management decisions. A negative result may occur with  ?improper specimen collection/handling, submission of specimen other ?than nasopharyngeal swab, presence of viral mutation(s) within the ?areas targeted by this assay, and inadequate number of viral ?copies(<138 copies/mL). A negative result must be combined with ?clinical observations, patient history, and epidemiological ?information. The expected result is Negative. ? ?Fact Sheet for Patients:  ?EntrepreneurPulse.com.au ? ?Fact Sheet for Healthcare Providers:  ?IncredibleEmployment.be ? ?This test is no t yet approved or cleared by the [place] FDA and  ?has been authorized for detection and/or diagnosis of SARS-CoV-2 by ?FDA under an Emergency Use Authorization (EUA). This EUA will remain  ?in effect (meaning this test can be used) for the duration of the ?COVID-19 declaration under Section 564(b)(1) of the  Act, 21 ?U.S.C.section 360bbb-3(b)(1), unless the authorization is terminated  ?or revoked sooner.  ? ? ?  ? Influenza A by PCR NEGATIVE NEGATIVE Final  ? Influenza B by PCR NEGATIVE NEGATIVE Final  ?  Comment: (NOTE) ?The

## 2021-11-13 NOTE — Care Management Important Message (Signed)
Important Message ? ?Patient Details  ?Name: Manuel [name] ?MRN: [id number] ?Date of Birth: [birth year] ? ? ?Medicare Important Message Given:  Yes ? ? ? ? ?Tymeshia Awan ?11/13/2021, 4:24 PM ?

## 2021-11-14 ENCOUNTER — Other Ambulatory Visit (HOSPITAL_COMMUNITY): Payer: Self-pay

## 2021-11-14 DIAGNOSIS — J189 Pneumonia, unspecified organism: Secondary | ICD-10-CM | POA: Diagnosis not present

## 2021-11-14 DIAGNOSIS — I5031 Acute diastolic (congestive) heart failure: Secondary | ICD-10-CM

## 2021-11-14 DIAGNOSIS — J441 Chronic obstructive pulmonary disease with (acute) exacerbation: Secondary | ICD-10-CM | POA: Diagnosis not present

## 2021-11-14 DIAGNOSIS — J9621 Acute and chronic respiratory failure with hypoxia: Secondary | ICD-10-CM | POA: Diagnosis not present

## 2021-11-14 LAB — CYTOLOGY - NON PAP

## 2021-11-14 LAB — GLUCOSE, CAPILLARY: Glucose-Capillary: 93 mg/dL (ref 70–99)

## 2021-11-14 LAB — BODY FLUID CULTURE W GRAM STAIN: Culture: NO GROWTH

## 2021-11-14 MED ORDER — PANTOPRAZOLE SODIUM 40 MG PO TBEC
40.0000 mg | DELAYED_RELEASE_TABLET | Freq: Every day | ORAL | 0 refills | Status: AC
Start: 2021-11-14 — End: ?
  Filled 2021-11-14: qty 30, 30d supply, fill #0

## 2021-11-14 MED ORDER — PREDNISONE 10 MG PO TABS
ORAL_TABLET | ORAL | 0 refills | Status: DC
  Filled 2021-11-14: qty 21, 6d supply, fill #0

## 2021-11-14 MED ORDER — AMOXICILLIN-POT CLAVULANATE 875-125 MG PO TABS
1.0000 | ORAL_TABLET | Freq: Two times a day (BID) | ORAL | 0 refills | Status: AC
  Filled 2021-11-14: qty 20, 10d supply, fill #0

## 2021-11-14 MED ORDER — FUROSEMIDE 40 MG PO TABS: 20.0000 mg | ORAL_TABLET | Freq: Every morning | ORAL | 1 refills | Status: DC

## 2021-11-14 NOTE — Progress Notes (Signed)
Occupational Therapy Treatment ?Patient Details ?Name: Manuel [name] ?MRN: [id number] ?DOB: Mar 15, 1946 ?Today's Date: 11/14/2021 ? ? ?History of present illness Manuel [name] is a 76 y.o. male with medical history significant for OSA on CPAP nightly, hypertension, hyperlipidemia, type 2 diabetes, COPD, chronic hypoxia on 2 L nasal cannula at baseline, former tobacco user, AAA, history of GI bleed, cirrhosis of liver, CKD, BPH, history of nonbleeding gastric ulcer. Admitted with COPD exacerbation, CAP, and acute on chronic CHF 4/21. ?  ?OT comments ? Pt progressing towards goals, pt able to complete standing grooming, UB/LB dressing and toileting with supervision during session, VSS On 3L O2. Pt's wife at bedside, pt/spouse have no concerns regarding self care/ADLs at d/c. Pt presenting with impairments listed below, will follow acutely. Recommend d/c home with family assistance.  ? ?Recommendations for follow up therapy are one component of a multi-disciplinary discharge planning process, led by the attending physician.  Recommendations may be updated based on patient status, additional functional criteria and insurance authorization. ?   ?Follow Up Recommendations ? No OT follow up  ?  ?Assistance Recommended at Discharge Set up Supervision/Assistance  ?Patient can return home with the following ? A little help with walking and/or transfers;A little help with bathing/dressing/bathroom;Assistance with cooking/housework;Help with stairs or ramp for entrance ?  ?Equipment Recommendations ? Tub/shower seat  ?  ?Recommendations for Other Services PT consult ? ?  ?Precautions / Restrictions Precautions ?Precautions: Fall ?Precaution Comments: Monitor O2 ?Restrictions ?Weight Bearing Restrictions: No  ? ? ?  ? ?Mobility Bed Mobility ?Overal bed mobility: Modified Independent ?  ?  ?  ?  ?  ?  ?  ?  ? ?Transfers ?Overall transfer level: Modified independent ?Equipment used: None ?  ?  ?  ?  ?  ?  ?  ?  ?  ?  ?Balance  Overall balance assessment: No apparent balance deficits (not formally assessed) ?  ?  ?  ?  ?  ?  ?  ?  ?  ?  ?  ?  ?  ?  ?  ?  ?  ?  ?   ? ?ADL either performed or assessed with clinical judgement  ? ?ADL Overall ADL's : Needs assistance/impaired ?  ?  ?Grooming: Dance movement psychotherapist;Wash/dry hands;Oral care;Standing ?Grooming Details (indicate cue type and reason): completed standing at sink ?  ?  ?  ?  ?Upper Body Dressing : Supervision/safety;Sitting ?Upper Body Dressing Details (indicate cue type and reason): to don shirt ?Lower Body Dressing: Supervision/safety;Sitting/lateral leans;Sit to/from stand ?Lower Body Dressing Details (indicate cue type and reason): to don pants/undergarments ?Toilet Transfer: Supervision/safety ?Toilet Transfer Details (indicate cue type and reason): completes standing toileting ?Toileting- Clothing Manipulation and Hygiene: Supervision/safety;Sit to/from stand ?Toileting - Clothing Manipulation Details (indicate cue type and reason): completed standing at commode ?  ?  ?Functional mobility during ADLs: Min guard ?  ?  ? ?Extremity/Trunk Assessment Upper Extremity Assessment ?Upper Extremity Assessment: Overall WFL for tasks assessed ?  ?Lower Extremity Assessment ?Lower Extremity Assessment: Defer to PT evaluation ?  ?  ?  ? ?Vision   ?Vision Assessment?: No apparent visual deficits ?Additional Comments: wears glasses at baseline ?  ?Perception Perception ?Perception: Not tested ?  ?Praxis Praxis ?Praxis: Not tested ?  ? ?Cognition Arousal/Alertness: Awake/alert ?Behavior During Therapy: [facility] for tasks assessed/performed ?Overall Cognitive Status: Within Functional Limits for tasks assessed ?  ?  ?  ?  ?  ?  ?  ?  ?  ?  ?  ?  ?  ?  ?  ?  ?  ?  ?  ?   ?  Exercises   ? ?  ?Shoulder Instructions   ? ? ?  ?General Comments VSS on 3L O2  ? ? ?Pertinent Vitals/ Pain       Pain Assessment ?Pain Assessment: No/denies pain ? ?Home Living   ?  ?  ?  ?  ?  ?  ?  ?  ?  ?  ?  ?  ?  ?  ?  ?  ?  ?  ? ?   ?Prior Functioning/Environment    ?  ?  ?  ?   ? ?Frequency ? Min 3X/week  ? ? ? ? ?  ?Progress Toward Goals ? ?OT Goals(current goals can now be found in the care plan section) ? Progress towards OT goals: Progressing toward goals ? ?Acute Rehab OT Goals ?Patient Stated Goal: none stated ?OT Goal Formulation: With patient ?Time For Goal Achievement: 11/26/21 ?Potential to Achieve Goals: Good ?ADL Goals ?Pt Will Perform Upper Body Dressing: with modified independence;sitting;standing ?Pt Will Perform Lower Body Dressing: with modified independence;sitting/lateral leans;sit to/from stand ?Pt Will Transfer to Toilet: with modified independence;ambulating;regular height toilet ?Pt Will Perform Tub/Shower Transfer: Shower transfer;Tub transfer;with modified independence;ambulating;shower seat  ?Plan Discharge plan remains appropriate;Frequency remains appropriate   ? ?Co-evaluation ? ? ?   ?  ?  ?  ?  ? ?  ?AM-PAC OT "6 Clicks" Daily Activity     ?Outcome Measure ? ? Help from another person eating meals?: None ?Help from another person taking care of personal grooming?: None ?Help from another person toileting, which includes using toliet, bedpan, or urinal?: None ?Help from another person bathing (including washing, rinsing, drying)?: A Little ?Help from another person to put on and taking off regular upper body clothing?: None ?Help from another person to put on and taking off regular lower body clothing?: None ?6 Click Score: 23 ? ?  ?End of Session Equipment Utilized During Treatment: Gait belt;Oxygen ? ?OT Visit Diagnosis: Unsteadiness on feet (R26.81);Other abnormalities of gait and mobility (R26.89);Muscle weakness (generalized) (M62.81) ?  ?Activity Tolerance Patient tolerated treatment well ?  ?Patient Left Other (comment) (up in room handoff to PT) ?  ?Nurse Communication Mobility status ?  ? ?   ? ?Time: 3568-6168 ?OT Time Calculation (min): 21 min ? ?Charges: OT General Charges ?$OT Visit: 1 Visit ?OT  Treatments ?$Self Care/Home Management : 8-22 mins ? ?Lynnda Child, OTD, OTR/L ?Acute Rehab ?[phone] ? ? ?[name] ?11/14/2021, 10:46 AM ?

## 2021-11-14 NOTE — Progress Notes (Signed)
[facility]) Hospital Liaison note: ? ?This patient is currently enrolled in [facility] outpatient-based Palliative Care. Will continue to follow for disposition. ? ?Please call with any outpatient palliative questions or concerns. ? ?Thank you, ?Lorelee Market, LPN ?[facility] Hospital Liaison ?[phone] ?

## 2021-11-14 NOTE — Discharge Summary (Addendum)
Physician Discharge Summary  ?RAKIN LEMELLE YIR:[id number] DOB: 05-Dec-1945 DOA: 11/10/2021 ? ?PCP: [name], DO ? ?Admit date: 11/10/2021 ?Discharge date: 11/14/2021 ? ?Admitted From: Home ?Disposition:  Home  ? ?Recommendations for Outpatient Follow-up:  ?Follow up with PCP in 1-2 weeks ?Please obtain BMP/CBC in one week ?Repeat 2 view chest x-ray in 4 to 6 weeks to ensure resolution of right lung pneumonia/effusion ? ?Home Health:NO ? ?Discharge Condition:Stable ?CODE STATUS:FULL ?Diet recommendation: Heart Healthy / Carb Modified  ? ?Brief/Interim Summary: ? ?Manuel [name] is a 76 y.o. male with medical history significant for OSA on CPAP nightly, hypertension, hyperlipidemia, type 2 diabetes, COPD, chronic hypoxia on 2 L nasal cannula at baseline, former tobacco user, quit 05/2021, AAA, history of GI bleed, cirrhosis of liver, CKD, BPH, history of nonbleeding gastric ulcer in 2019, patient with recent hospitalization at [facility] due to pneumonia requiring intubation and ventilatory support and ICU stay, who presented to [facility] ED with complaints of gradually worsening shortness of breath of 1 week duration.  Associated with a productive cough with light-colored phlegm, and generalized weakness.  Patient was recently admitted to the hospital for aspiration pneumonia, was found to have acute hypoxic and hypercarbic respiratory failure requiring intubation, was discharged on 10/01/2021.  Upon presentation to the ED, work-up revealed acute COPD exacerbation with bilateral diffuse wheezing on exam with mild rales at bases.  Notable bilateral lower extremity edema with concern for superimposed heart failure exacerbation.  Chest x-ray showing right-sided pleural effusion and pulm edema.  BNP is equivocal 178.  Patient was started on IV antibiotics, IV steroids and nebulizer breathing treatment in the ED. patient did significant ? ?COPD exacerbation with concern for superimposed acute on chronic diastolic  CHF. ?-Patient presents with worsening oxygen requirement, significant wheezing bilaterally, he was treated with IV steroids, scheduled DuoNebs, this has significantly improved, oxygen requirement back to baseline, no further wheezing, he will be discharged on prednisone taper . ?-Patient was encouraged to use incentive spirometry and flutter valve, to take at home with him and keep using it.   ? ? ?right lower lobe CAP, parapneumonic effusion POA ?-Patient was treated with IV Rocephin and azithromycin during hospital stay with significant improvement, but given the fact he is having recurrent pneumonia in the same site for which he required intubation at recent specialization at [facility], and resents of parapneumonic exudative pleural effusion, he will be treated with another 10 days of oral Augmentin as an outpatient.   ? ?  ?Exudative right pleural effusion  ?-Related to parapneumonic effusion ?-Status post thoracentesis 1.6 L drained, he was encouraged use incentive spirometer on discharge  ?-Significant for exudative pleural effusion with LDH ratio of 0.68, and protein ratio of 0.56 ?- he was encouraged to use incentive spirometry and flutter valve at home. ?  ?Acute on chronic hypoxic and hypercarbic respiratory failure secondary to above ?-Requiring up to 6 L initially, he is currently back to baseline on 3 L nasal cannula.   ?  ?Acute on chronic HFpEF 50-55% ?- Last 2D echo done in 2019 revealed LVEF 50 to 55% with grade 2 diastolic dysfunction ?-2D echo this admission with a preserved EF 55 to 60% ?-Received IV Lasix on admission, blood pressure remains soft.  Limited diuresis during hospital stay, given blood pressure remains soft and he is euvolemic currently, his Lasix will be lowered to 20 mg oral daily on discharge. ?  ?AKI on CKD 2 ?Baseline creatinine appears to be 1.2  with GFR greater than 60 ?Presented with creatinine 1.56 with GFR 46 ? ?Chronic A-fib ?Rate controlled, Cardizem resumed  on discharge ?Resume home Eliquis for CVA prevention ?OSA ?Continue CPAP nightly ?  ?Physical debility ?Was seen by PT and OT during hospital stay, no need for home health. ?  ?  ?  ? ?Discharge Diagnoses:  ?Principal Problem: ?  COPD exacerbation ([place]) ?Active Problems: ?  BPH (benign prostatic hyperplasia) ?  DM (diabetes mellitus) ([place]) ?  Primary hypertension ?  Hypercholesteremia ?  Acute on chronic respiratory failure with hypoxia and hypercapnia (HCC) ?  CAP (community acquired pneumonia) ?  Pleural effusion on right ? ? ? ?Discharge Instructions ? ?Discharge Instructions   ? ? Diet - low sodium heart healthy   Complete by: As directed ?  ? Discharge instructions   Complete by: As directed ?  ? Follow with Primary MD [name], DO in 7 days  ? ?Get CBC, CMP, 2 view Chest X ray checked  by Primary MD next visit.  ? ? ?Activity: As tolerated with Full fall precautions use walker/cane & assistance as needed ? ? ?Disposition Home  ? ? ?Diet: Heart Healthy , Carb modified, with feeding assistance and aspiration precautions. ? ?For Heart failure patients - Check your Weight same time everyday, if you gain over 2 pounds, or you develop in leg swelling, experience more shortness of breath or chest pain, call your Primary MD immediately. Follow Cardiac Low Salt Diet and 1.5 lit/day fluid restriction. ? ? ?On your next visit with your primary care physician please Get Medicines reviewed and adjusted. ? ? ?Please request your Prim.MD to go over all Hospital Tests and Procedure/Radiological results at the follow up, please get all Hospital records sent to your Prim MD by signing hospital release before you go home. ? ? ?If you experience worsening of your admission symptoms, develop shortness of breath, life threatening emergency, suicidal or homicidal thoughts you must seek medical attention immediately by calling 911 or calling your MD immediately  if symptoms less severe. ? ?You Must read complete  instructions/literature along with all the possible adverse reactions/side effects for all the Medicines you take and that have been prescribed to you. Take any new Medicines after you have completely understood and accpet all the possible adverse reactions/side effects.  ? ?Do not drive, operating heavy machinery, perform activities at heights, swimming or participation in water activities or provide baby sitting services if your were admitted for syncope or siezures until you have seen by Primary MD or a Neurologist and advised to do so again. ? ?Do not drive when taking Pain medications.  ? ? ?Do not take more than prescribed Pain, Sleep and Anxiety Medications ? ?Special Instructions: If you have smoked or chewed Tobacco  in the last 2 yrs please stop smoking, stop any regular Alcohol  and or any Recreational drug use. ? ?Wear Seat belts while driving. ? ? ?Please note ? ?You were cared for by a hospitalist during your hospital stay. If you have any questions about your discharge medications or the care you received while you were in the hospital after you are discharged, you can call the unit and asked to speak with the hospitalist on call if the hospitalist that took care of you is not available. Once you are discharged, your primary care physician will handle any further medical issues. Please note that NO REFILLS for any discharge medications will be authorized once you are discharged, as it  is imperative that you return to your primary care physician (or establish a relationship with a primary care physician if you do not have one) for your aftercare needs so that they can reassess your need for medications and monitor your lab values.  ? Increase activity slowly   Complete by: As directed ?  ? ?  ? ?Allergies as of 11/14/2021   ? ?   Reactions  ? Sulfa Antibiotics Shortness Of Breath  ? Headaches   ? Colchicine   ? Other reaction(s): diarrhea  ? Labetalol Hcl   ? Other reaction(s): dysuria  ? Tramadol Hcl    ? Other reaction(s): nausea  ? ?  ? ?  ?Medication List  ?  ? ?STOP taking these medications   ? ?lisinopril 10 MG tablet ?Commonly known as: ZESTRIL ?  ? ?  ? ?TAKE these medications   ? ?acetaminophen 500 MG tablet ?C

## 2021-11-14 NOTE — Progress Notes (Signed)
Physical Therapy Treatment ?Patient Details ?Name: Manuel [name] ?MRN: [id number] ?DOB: October 04, 1945 ?Today's Date: 11/14/2021 ? ? ?History of Present Illness Manuel [name] is a 76 y.o. male with medical history significant for OSA on CPAP nightly, hypertension, hyperlipidemia, type 2 diabetes, COPD, chronic hypoxia on 2 L nasal cannula at baseline, former tobacco user, AAA, history of GI bleed, cirrhosis of liver, CKD, BPH, history of nonbleeding gastric ulcer. Admitted with COPD exacerbation, CAP, and acute on chronic CHF 4/21. ? ?  ?PT Comments  ? ? Pt continues to make good progress towards goals, able to ambulate with increased stability this session managing own O2 tank, requiring x1 seated recovery break. VSS stable throughout on 3L O2. Pt educated on various options/ways to increase activity tolerance slowly but steadily once home and benefits of continued mobility, pt verbalizing understanding and stating he is motivated. Answered all pt and spouse questions, anticipate safe discharge once medically cleared, will follow acutely. Pt continues to benefit from skilled PT services to progress toward functional mobility goals.  ? ?  ?Recommendations for follow up therapy are one component of a multi-disciplinary discharge planning process, led by the attending physician.  Recommendations may be updated based on patient status, additional functional criteria and insurance authorization. ? ?Follow Up Recommendations ? No PT follow up ?  ?  ?Assistance Recommended at Discharge PRN  ?Patient can return home with the following Assist for transportation ?  ?Equipment Recommendations ? None recommended by PT  ?  ?Recommendations for Other Services   ? ? ?  ?Precautions / Restrictions Precautions ?Precautions: Fall ?Precaution Comments: Monitor O2 ?Restrictions ?Weight Bearing Restrictions: No  ?  ? ?Mobility ? Bed Mobility ?Overal bed mobility: Modified Independent ?  ?  ?  ?  ?  ?  ?  ?  ? ?Transfers ?Overall transfer  level: Modified independent ?Equipment used: None ?  ?  ?  ?  ?  ?  ?  ?  ?  ? ?Ambulation/Gait ?Ambulation/Gait assistance: Supervision ?Gait Distance (Feet): 220 Feet ?Assistive device: None ?Gait Pattern/deviations: Step-through pattern, Drifts right/left ?Gait velocity: slightly decr ?  ?  ?General Gait Details: Supervision for safety pt able to manage O2 tank with min a, seated recovery x1 ? ? ?Stairs ?  ?  ?  ?  ?  ? ? ?Wheelchair Mobility ?  ? ?Modified Rankin (Stroke Patients Only) ?  ? ? ?  ?Balance Overall balance assessment: No apparent balance deficits (not formally assessed) ?  ?  ?  ?  ?  ?  ?  ?  ?  ?  ?  ?  ?  ?  ?  ?  ?  ?  ?  ? ?  ?Cognition Arousal/Alertness: Awake/alert ?Behavior During Therapy: [facility] for tasks assessed/performed ?Overall Cognitive Status: Within Functional Limits for tasks assessed ?  ?  ?  ?  ?  ?  ?  ?  ?  ?  ?  ?  ?  ?  ?  ?  ?  ?  ?  ? ?  ?Exercises   ? ?  ?General Comments General comments (skin integrity, edema, etc.): VSS on 3L [place] ?  ?  ? ?Pertinent Vitals/Pain    ? ? ?Home Living   ?  ?  ?  ?  ?  ?  ?  ?  ?  ?   ?  ?Prior Function    ?  ?  ?   ? ?PT Goals (  current goals can now be found in the care plan section) Acute Rehab PT Goals ?PT Goal Formulation: With patient ?Time For Goal Achievement: 11/26/21 ? ?  ?Frequency ? ? ? Min 3X/week ? ? ? ?  ?PT Plan    ? ? ?Co-evaluation   ?  ?  ?  ?  ? ?  ?AM-PAC PT "6 Clicks" Mobility   ?Outcome Measure ? Help needed turning from your back to your side while in a flat bed without using bedrails?: None ?Help needed moving from lying on your back to sitting on the side of a flat bed without using bedrails?: None ?Help needed moving to and from a bed to a chair (including a wheelchair)?: None ?Help needed standing up from a chair using your arms (e.g., wheelchair or bedside chair)?: None ?Help needed to walk in hospital room?: None ?Help needed climbing 3-5 steps with a railing? : None ?6 Click Score: 24 ? ?  ?End of Session Equipment  Utilized During Treatment: Oxygen;Gait belt ?Activity Tolerance: Patient tolerated treatment well ?Patient left: in chair;with call bell/phone within reach;with chair alarm set;with family/visitor present ?Nurse Communication: Mobility status ?PT Visit Diagnosis: Unsteadiness on feet (R26.81);Other abnormalities of gait and mobility (R26.89) ?  ? ? ?Time: 7628-3151 ?PT Time Calculation (min) (ACUTE ONLY): 20 min ? ?Charges:  $Therapeutic Exercise: 8-22 mins          ?          ? ?Manuel [name]. PTA ?Acute Rehabilitation Services ?Office: [phone] ? ? ? ?Manuel [name] ?11/14/2021, 11:03 AM ? ?

## 2021-11-14 NOTE — Discharge Instructions (Signed)
Follow with Primary MD [name], DO in 7 days  ? ?Get CBC, CMP, 2 view Chest X ray checked  by Primary MD next visit.  ? ? ?Activity: As tolerated with Full fall precautions use walker/cane & assistance as needed ? ? ?Disposition Home  ? ? ?Diet: Heart Healthy , with feeding assistance and aspiration precautions. ? ?For Heart failure patients - Check your Weight same time everyday, if you gain over 2 pounds, or you develop in leg swelling, experience more shortness of breath or chest pain, call your Primary MD immediately. Follow Cardiac Low Salt Diet and 1.5 lit/day fluid restriction. ? ? ?On your next visit with your primary care physician please Get Medicines reviewed and adjusted. ? ? ?Please request your Prim.MD to go over all Hospital Tests and Procedure/Radiological results at the follow up, please get all Hospital records sent to your Prim MD by signing hospital release before you go home. ? ? ?If you experience worsening of your admission symptoms, develop shortness of breath, life threatening emergency, suicidal or homicidal thoughts you must seek medical attention immediately by calling 911 or calling your MD immediately  if symptoms less severe. ? ?You Must read complete instructions/literature along with all the possible adverse reactions/side effects for all the Medicines you take and that have been prescribed to you. Take any new Medicines after you have completely understood and accpet all the possible adverse reactions/side effects.  ? ?Do not drive, operating heavy machinery, perform activities at heights, swimming or participation in water activities or provide baby sitting services if your were admitted for syncope or siezures until you have seen by Primary MD or a Neurologist and advised to do so again. ? ?Do not drive when taking Pain medications.  ? ? ?Do not take more than prescribed Pain, Sleep and Anxiety Medications ? ?Special Instructions: If you have smoked or chewed Tobacco  in the  last 2 yrs please stop smoking, stop any regular Alcohol  and or any Recreational drug use. ? ?Wear Seat belts while driving. ? ? ?Please note ? ?You were cared for by a hospitalist during your hospital stay. If you have any questions about your discharge medications or the care you received while you were in the hospital after you are discharged, you can call the unit and asked to speak with the hospitalist on call if the hospitalist that took care of you is not available. Once you are discharged, your primary care physician will handle any further medical issues. Please note that NO REFILLS for any discharge medications will be authorized once you are discharged, as it is imperative that you return to your primary care physician (or establish a relationship with a primary care physician if you do not have one) for your aftercare needs so that they can reassess your need for medications and monitor your lab values.  ?

## 2021-11-15 ENCOUNTER — Ambulatory Visit: Payer: Medicare HMO | Admitting: Oncology

## 2021-11-15 ENCOUNTER — Ambulatory Visit: Payer: Medicare HMO

## 2021-11-15 ENCOUNTER — Other Ambulatory Visit (HOSPITAL_COMMUNITY): Payer: Self-pay

## 2021-11-23 ENCOUNTER — Encounter: Payer: Self-pay | Admitting: Family Medicine

## 2021-11-23 ENCOUNTER — Other Ambulatory Visit: Payer: Medicare HMO | Admitting: Family Medicine

## 2021-11-23 VITALS — BP 118/64 | HR 82 | Resp 18

## 2021-11-23 DIAGNOSIS — J189 Pneumonia, unspecified organism: Secondary | ICD-10-CM | POA: Diagnosis not present

## 2021-11-23 DIAGNOSIS — Z09 Encounter for follow-up examination after completed treatment for conditions other than malignant neoplasm: Secondary | ICD-10-CM | POA: Diagnosis not present

## 2021-11-23 DIAGNOSIS — R031 Nonspecific low blood-pressure reading: Secondary | ICD-10-CM | POA: Diagnosis not present

## 2021-11-23 DIAGNOSIS — J9611 Chronic respiratory failure with hypoxia: Secondary | ICD-10-CM | POA: Diagnosis not present

## 2021-11-23 DIAGNOSIS — J9621 Acute and chronic respiratory failure with hypoxia: Secondary | ICD-10-CM

## 2021-11-23 DIAGNOSIS — I4821 Permanent atrial fibrillation: Secondary | ICD-10-CM | POA: Diagnosis not present

## 2021-11-23 DIAGNOSIS — F321 Major depressive disorder, single episode, moderate: Secondary | ICD-10-CM

## 2021-11-23 DIAGNOSIS — J441 Chronic obstructive pulmonary disease with (acute) exacerbation: Secondary | ICD-10-CM

## 2021-11-23 DIAGNOSIS — N179 Acute kidney failure, unspecified: Secondary | ICD-10-CM | POA: Diagnosis not present

## 2021-11-23 DIAGNOSIS — R69 Illness, unspecified: Secondary | ICD-10-CM | POA: Diagnosis not present

## 2021-11-23 DIAGNOSIS — Z515 Encounter for palliative care: Secondary | ICD-10-CM

## 2021-11-23 DIAGNOSIS — J9622 Acute and chronic respiratory failure with hypercapnia: Secondary | ICD-10-CM | POA: Diagnosis not present

## 2021-11-23 DIAGNOSIS — E1122 Type 2 diabetes mellitus with diabetic chronic kidney disease: Secondary | ICD-10-CM | POA: Diagnosis not present

## 2021-11-23 DIAGNOSIS — Z9981 Dependence on supplemental oxygen: Secondary | ICD-10-CM | POA: Diagnosis not present

## 2021-11-23 LAB — MISC LABCORP TEST (SEND OUT): Labcorp test code: 9985

## 2021-11-23 NOTE — Progress Notes (Signed)
? ? ?Manufacturing engineer ?Community Palliative Care Consult Note ?Telephone: [phone]  ?Fax: [phone]  ? ? ?Date of encounter: 11/23/21 ?10:56 AM ?PATIENT NAME: Manuel [name] ?[place] [address]   ?[phone] (home)  ?DOB: [birth year] ?MRN: [id number] ?PRIMARY CARE PROVIDER:    ?Manuel Morning, DO,  ?[address] ?[place] [address] ?[phone] ? ?REFERRING PROVIDER:   ?Manuel Morning, DO ?[address] ?[address] ?[place],  [place] [address] ?[phone] ? ?RESPONSIBLE PARTY:    ?Contact Information   ? ? Name Relation Home Work Mobile  ? Manuel, [name] Spouse [phone]  [phone]  ? Manuel [name],Manuel [name] Son [phone]    ? ?  ? ? ? ?I met face to face with patient and wife in their home. Palliative Care was asked to follow this patient by consultation request of  Manuel Morning, DO to address advance care planning and complex medical decision making. This is an acute follow up visit. ? ?ASSESSMENT, SYMPTOM MANAGEMENT AND PLAN / RECOMMENDATIONS:  ? Acute on Chronic Respiratory Failure with Hypoxia ?Secondary to cor pulmonale, COPD exacerbation with pneumonia and pleural effusion, atrial fibrillation. ?Hypoxemic today, resistive to wearing O2 continuously (ordered at 3L) ? ?2. COPD exacerbation ?Improving. Demonstrated use of Albuterol nebs. ?Completed Prednisone taper 11/20/21. ?Continue use of flutter valve and spirometry. ? ?3.  Atrial fibrillation, rate controlled, anticoagulated on Eliquis ?Continue Cardizem CD 240 mg daily and Eliquis 5 mg BID ? ?4.    Palliative Care Encounter ?Pt compliant with CPAP at night but not continuing to use O2 with CPAP ?Wants to be more active, educated that improving O2 will improve energy and make it easier to be more active. ? ?5..  Moderate depression ?Recommend to consider addition of Mirtazapine 15 mg to stimulate appetite and improve mood if QT not too long. ? ? ? ?Advance Care Planning/Goals of Care:  ?CODE  STATUS: ?Currently full code, need to address goals of care on next contact ? ? ? ?Follow up Palliative Care Visit: Palliative care will continue to follow for complex medical decision making, advance care planning, and clarification of goals. Return visit 4 weeks or sooner prn. ? ? ?This visit was coded based on medical decision making (MDM). ? ?PPS: 60%  ? ?HOSPICE ELIGIBILITY/DIAGNOSIS: TBD ? ?Chief Complaint:  ?Palliative Care is following up after recent admission for pneumonia and pleural effusion. ? ?HISTORY OF PRESENT ILLNESS:  Manuel [name] is a 76 y.o. year old male with acute on chronic respiratory failure due to aspiration pneumonia, COPD and cor pulmonale. He also has HTN, OSA on CPAP, permanent atrial fibrillation, AAA without rupture, cirrhosis of the liver, hx of SBO and GI bleed due to duodenal ulcer, hx of colon cancer, DM and secondary hyperparathyroidism, congenital absence of left kidney and CKD with anemia, BPH, colostomy present and HLD, and Vitamin B12 deficiency. ?Patient was hospitalized after last visit and was diagnosed with COPD exacerbation with pneumonia and parapneumonic exudative right pleural effusion started on Prednisone and underwent thoracentesis of 1.6 L.  He was treated with IV Rocephin and Azithromycin and started on Augmentin orally for 10 days. Repeat 2d echo during admission with EF 55-60% and BP was decreased so Lasix dose decreased to 20 mg daily. On presentation his Cr was increased to 1.56 with BUN 22 and eGFR 46 (down to 1.40 BUN 28 and eGFR 52), Albumin low at 3.1.  WBC depressed on admission to 2.8.  Elevated LD fluid with increased total nucleated cell count.  Pleural fluid culture without growth, had many PMN and mononuclear cells present. ? ?Denies CP, SOB, nausea, vomiting, dysuria.  Wife states he ate better today but overall still decreased. Pt endorses not wanting to eat unless he is hungry and doesn't like being told to come and eat.  He also "take an  Oxygen treatment when I feel like I need one and it does give me more energy."  Advised pt if he was using O2 as prescribed he would have less fatigue and more ability to be active but he does not believe he needs it.  He was bleeding O2 into his CPAP but currently does not use any Oxygen overnight and is pleasantly resistive to suggestion that this may help. ? ?History obtained from review of EMR, discussion with wife and/or Manuel [name].  ?I reviewed available labs, medications, imaging, studies and related documents from the EMR.  Records reviewed and summarized above.  ? ?ROS ? ?General: NAD ?EYES: denies vision changes ?ENMT: denies dysphagia ?Cardiovascular: denies chest pain, denies DOE, orthopnea and PND ?Pulmonary: denies cough, endorses increased SOB intermittently ?Abdomen: endorses fair appetite ?GU: denies dysuria, endorses continence of urine ?MSK:  denies increased weakness, no falls reported ?Skin: denies rashes or wounds ?Neurological: denies pain and insomnia, lightheaded intermittently ?Psych: Endorses apathetic mood ?Heme/lymph/immuno: denies bruises, abnormal bleeding ? ?Physical Exam: ?Current and past weights: 191 lbs 3.2 ounces as of 10/20/21, current weight 179 lbs 3.7 ounces ?Constitutional: NAD ?General: thin, WD ?EYES: anicteric sclera, lids intact, no discharge  ?ENMT: intact hearing, oral mucous membranes moist, dentition intact ?CV: S1S2, IRIR rate controlled with no BLE edema ?Pulmonary: Diminished in BLL, no increased work of breathing, no cough ?Abdomen: normo-active BS + 4 quadrants, soft and non tender, no ascites ?GU: deferred ?MSK: no sarcopenia, moves all extremities, ambulates without difficulty ?Skin: warm and dry, no rashes or wounds on visible skin ?Neuro:  no generalized weakness,  no cognitive impairment ?Psych:  Blunted affect, pleasantly resistive ?Hem/lymph/immuno: no widespread bruising ? ? ?Thank you for the opportunity to participate in the care of Manuel [name].  The  palliative care team will continue to follow. Please call our office at [phone] if we can be of additional assistance.  ? ?Marijo Conception, FNP-C ? ?COVID-19 PATIENT SCREENING TOOL ?Asked and negative response unless otherwise noted:  ? ?Have you had symptoms of covid, tested positive or been in contact with someone with symptoms/positive test in the past 5-10 days? No ? ?

## 2021-11-27 ENCOUNTER — Ambulatory Visit: Payer: Medicare HMO | Admitting: Pulmonary Disease

## 2021-11-28 DIAGNOSIS — J9601 Acute respiratory failure with hypoxia: Secondary | ICD-10-CM | POA: Diagnosis not present

## 2021-11-30 DIAGNOSIS — G4733 Obstructive sleep apnea (adult) (pediatric): Secondary | ICD-10-CM | POA: Diagnosis not present

## 2021-12-04 ENCOUNTER — Other Ambulatory Visit: Payer: Self-pay

## 2021-12-04 ENCOUNTER — Encounter (HOSPITAL_BASED_OUTPATIENT_CLINIC_OR_DEPARTMENT_OTHER): Payer: Self-pay

## 2021-12-04 ENCOUNTER — Telehealth: Payer: Self-pay | Admitting: Family Medicine

## 2021-12-04 ENCOUNTER — Inpatient Hospital Stay (HOSPITAL_BASED_OUTPATIENT_CLINIC_OR_DEPARTMENT_OTHER)
Admission: EM | Admit: 2021-12-04 | Discharge: 2021-12-09 | DRG: 377 | Disposition: A | Discharge status: Home / Self Care | Payer: Medicare HMO | Attending: Internal Medicine | Admitting: Internal Medicine

## 2021-12-04 DIAGNOSIS — D62 Acute posthemorrhagic anemia: Secondary | ICD-10-CM

## 2021-12-04 DIAGNOSIS — J9611 Chronic respiratory failure with hypoxia: Secondary | ICD-10-CM | POA: Diagnosis not present

## 2021-12-04 DIAGNOSIS — K2970 Gastritis, unspecified, without bleeding: Secondary | ICD-10-CM | POA: Diagnosis not present

## 2021-12-04 DIAGNOSIS — E7800 Pure hypercholesterolemia, unspecified: Secondary | ICD-10-CM | POA: Diagnosis present

## 2021-12-04 DIAGNOSIS — K2971 Gastritis, unspecified, with bleeding: Secondary | ICD-10-CM | POA: Diagnosis not present

## 2021-12-04 DIAGNOSIS — Z833 Family history of diabetes mellitus: Secondary | ICD-10-CM

## 2021-12-04 DIAGNOSIS — K635 Polyp of colon: Secondary | ICD-10-CM | POA: Diagnosis not present

## 2021-12-04 DIAGNOSIS — I10 Essential (primary) hypertension: Secondary | ICD-10-CM | POA: Diagnosis not present

## 2021-12-04 DIAGNOSIS — N182 Chronic kidney disease, stage 2 (mild): Secondary | ICD-10-CM | POA: Diagnosis present

## 2021-12-04 DIAGNOSIS — R188 Other ascites: Secondary | ICD-10-CM | POA: Diagnosis not present

## 2021-12-04 DIAGNOSIS — I4891 Unspecified atrial fibrillation: Secondary | ICD-10-CM | POA: Diagnosis not present

## 2021-12-04 DIAGNOSIS — I130 Hypertensive heart and chronic kidney disease with heart failure and stage 1 through stage 4 chronic kidney disease, or unspecified chronic kidney disease: Secondary | ICD-10-CM | POA: Diagnosis not present

## 2021-12-04 DIAGNOSIS — E538 Deficiency of other specified B group vitamins: Secondary | ICD-10-CM | POA: Diagnosis not present

## 2021-12-04 DIAGNOSIS — K922 Gastrointestinal hemorrhage, unspecified: Principal | ICD-10-CM | POA: Diagnosis present

## 2021-12-04 DIAGNOSIS — Z7984 Long term (current) use of oral hypoglycemic drugs: Secondary | ICD-10-CM | POA: Diagnosis not present

## 2021-12-04 DIAGNOSIS — Z6822 Body mass index (BMI) 22.0-22.9, adult: Secondary | ICD-10-CM

## 2021-12-04 DIAGNOSIS — Z809 Family history of malignant neoplasm, unspecified: Secondary | ICD-10-CM

## 2021-12-04 DIAGNOSIS — I5032 Chronic diastolic (congestive) heart failure: Secondary | ICD-10-CM

## 2021-12-04 DIAGNOSIS — F419 Anxiety disorder, unspecified: Secondary | ICD-10-CM | POA: Diagnosis present

## 2021-12-04 DIAGNOSIS — N2581 Secondary hyperparathyroidism of renal origin: Secondary | ICD-10-CM | POA: Diagnosis not present

## 2021-12-04 DIAGNOSIS — E119 Type 2 diabetes mellitus without complications: Secondary | ICD-10-CM

## 2021-12-04 DIAGNOSIS — D72819 Decreased white blood cell count, unspecified: Secondary | ICD-10-CM | POA: Diagnosis not present

## 2021-12-04 DIAGNOSIS — D631 Anemia in chronic kidney disease: Secondary | ICD-10-CM | POA: Diagnosis present

## 2021-12-04 DIAGNOSIS — Z79899 Other long term (current) drug therapy: Secondary | ICD-10-CM

## 2021-12-04 DIAGNOSIS — I4821 Permanent atrial fibrillation: Secondary | ICD-10-CM | POA: Diagnosis not present

## 2021-12-04 DIAGNOSIS — J449 Chronic obstructive pulmonary disease, unspecified: Secondary | ICD-10-CM | POA: Diagnosis not present

## 2021-12-04 DIAGNOSIS — Q600 Renal agenesis, unilateral: Secondary | ICD-10-CM | POA: Diagnosis not present

## 2021-12-04 DIAGNOSIS — K7460 Unspecified cirrhosis of liver: Secondary | ICD-10-CM | POA: Diagnosis not present

## 2021-12-04 DIAGNOSIS — Z85038 Personal history of other malignant neoplasm of large intestine: Secondary | ICD-10-CM

## 2021-12-04 DIAGNOSIS — D649 Anemia, unspecified: Secondary | ICD-10-CM | POA: Diagnosis not present

## 2021-12-04 DIAGNOSIS — E43 Unspecified severe protein-calorie malnutrition: Secondary | ICD-10-CM | POA: Diagnosis not present

## 2021-12-04 DIAGNOSIS — K5731 Diverticulosis of large intestine without perforation or abscess with bleeding: Secondary | ICD-10-CM | POA: Diagnosis not present

## 2021-12-04 DIAGNOSIS — D696 Thrombocytopenia, unspecified: Secondary | ICD-10-CM | POA: Diagnosis not present

## 2021-12-04 DIAGNOSIS — K5520 Angiodysplasia of colon without hemorrhage: Secondary | ICD-10-CM | POA: Diagnosis not present

## 2021-12-04 DIAGNOSIS — N400 Enlarged prostate without lower urinary tract symptoms: Secondary | ICD-10-CM | POA: Diagnosis not present

## 2021-12-04 DIAGNOSIS — K5521 Angiodysplasia of colon with hemorrhage: Principal | ICD-10-CM | POA: Diagnosis present

## 2021-12-04 DIAGNOSIS — K7469 Other cirrhosis of liver: Secondary | ICD-10-CM | POA: Diagnosis not present

## 2021-12-04 DIAGNOSIS — G4733 Obstructive sleep apnea (adult) (pediatric): Secondary | ICD-10-CM | POA: Diagnosis present

## 2021-12-04 DIAGNOSIS — K5730 Diverticulosis of large intestine without perforation or abscess without bleeding: Secondary | ICD-10-CM | POA: Diagnosis not present

## 2021-12-04 DIAGNOSIS — Z7901 Long term (current) use of anticoagulants: Secondary | ICD-10-CM

## 2021-12-04 DIAGNOSIS — F32A Depression, unspecified: Secondary | ICD-10-CM | POA: Diagnosis present

## 2021-12-04 DIAGNOSIS — Z9989 Dependence on other enabling machines and devices: Secondary | ICD-10-CM | POA: Diagnosis not present

## 2021-12-04 DIAGNOSIS — Z87891 Personal history of nicotine dependence: Secondary | ICD-10-CM

## 2021-12-04 DIAGNOSIS — I5031 Acute diastolic (congestive) heart failure: Secondary | ICD-10-CM

## 2021-12-04 DIAGNOSIS — K921 Melena: Secondary | ICD-10-CM | POA: Diagnosis not present

## 2021-12-04 DIAGNOSIS — E1122 Type 2 diabetes mellitus with diabetic chronic kidney disease: Secondary | ICD-10-CM | POA: Diagnosis present

## 2021-12-04 DIAGNOSIS — Z9049 Acquired absence of other specified parts of digestive tract: Secondary | ICD-10-CM

## 2021-12-04 DIAGNOSIS — Z9981 Dependence on supplemental oxygen: Secondary | ICD-10-CM

## 2021-12-04 LAB — CBC WITH DIFFERENTIAL/PLATELET
Abs Immature Granulocytes: 0.02 10*3/uL (ref 0.00–0.07)
Basophils Absolute: 0 10*3/uL (ref 0.0–0.1)
Basophils Relative: 1 %
Eosinophils Absolute: 0 10*3/uL (ref 0.0–0.5)
Eosinophils Relative: 0 %
HCT: 33 % — ABNORMAL LOW (ref 39.0–52.0)
Hemoglobin: 10 g/dL — ABNORMAL LOW (ref 13.0–17.0)
Immature Granulocytes: 1 %
Lymphocytes Relative: 7 %
Lymphs Abs: 0.3 10*3/uL — ABNORMAL LOW (ref 0.7–4.0)
MCH: 29.9 pg (ref 26.0–34.0)
MCHC: 30.3 g/dL (ref 30.0–36.0)
MCV: 98.8 fL (ref 80.0–100.0)
Monocytes Absolute: 0.2 10*3/uL (ref 0.1–1.0)
Monocytes Relative: 5 %
Neutro Abs: 3.3 10*3/uL (ref 1.7–7.7)
Neutrophils Relative %: 86 %
Platelets: 157 10*3/uL (ref 150–400)
RBC: 3.34 MIL/uL — ABNORMAL LOW (ref 4.22–5.81)
RDW: 16 % — ABNORMAL HIGH (ref 11.5–15.5)
WBC: 3.8 10*3/uL — ABNORMAL LOW (ref 4.0–10.5)
nRBC: 0 % (ref 0.0–0.2)

## 2021-12-04 LAB — TYPE AND SCREEN
ABO/RH(D): A POS
Antibody Screen: NEGATIVE

## 2021-12-04 LAB — COMPREHENSIVE METABOLIC PANEL
ALT: 7 U/L (ref 0–44)
AST: 10 U/L — ABNORMAL LOW (ref 15–41)
Albumin: 3.5 g/dL (ref 3.5–5.0)
Alkaline Phosphatase: 57 U/L (ref 38–126)
Anion gap: 6 (ref 5–15)
BUN: 28 mg/dL — ABNORMAL HIGH (ref 8–23)
CO2: 35 mmol/L — ABNORMAL HIGH (ref 22–32)
Calcium: 9 mg/dL (ref 8.9–10.3)
Chloride: 101 mmol/L (ref 98–111)
Creatinine, Ser: 1.26 mg/dL — ABNORMAL HIGH (ref 0.61–1.24)
GFR, Estimated: 59 mL/min — ABNORMAL LOW (ref >60)
Glucose, Bld: 125 mg/dL — ABNORMAL HIGH (ref 70–99)
Potassium: 4.4 mmol/L (ref 3.5–5.1)
Sodium: 142 mmol/L (ref 135–145)
Total Bilirubin: 0.8 mg/dL (ref 0.3–1.2)
Total Protein: 6 g/dL — ABNORMAL LOW (ref 6.5–8.1)

## 2021-12-04 LAB — GLUCOSE, CAPILLARY
Glucose-Capillary: 77 mg/dL (ref 70–99)
Glucose-Capillary: 201 mg/dL — ABNORMAL HIGH (ref 70–99)

## 2021-12-04 LAB — HEMOGLOBIN AND HEMATOCRIT, BLOOD
HCT: 32.4 % — ABNORMAL LOW (ref 39.0–52.0)
HCT: 29.4 % — ABNORMAL LOW (ref 39.0–52.0)
Hemoglobin: 9.9 g/dL — ABNORMAL LOW (ref 13.0–17.0)
Hemoglobin: 8.7 g/dL — ABNORMAL LOW (ref 13.0–17.0)

## 2021-12-04 LAB — OCCULT BLOOD X 1 CARD TO LAB, STOOL: Fecal Occult Bld: POSITIVE — AB

## 2021-12-04 MED ORDER — DOXAZOSIN MESYLATE 2 MG PO TABS
4.0000 mg | ORAL_TABLET | Freq: Every day | ORAL | Status: DC
  Administered 2021-12-05 – 2021-12-08 (×4): 4 mg via ORAL
  Filled 2021-12-04 (×4): qty 2

## 2021-12-04 MED ORDER — PANTOPRAZOLE INFUSION (NEW) - SIMPLE MED
8.0000 mg/h | INTRAVENOUS | Status: DC
  Administered 2021-12-04 – 2021-12-07 (×6): 8 mg/h via INTRAVENOUS
  Filled 2021-12-04 (×4): qty 80
  Filled 2021-12-04: qty 100
  Filled 2021-12-04: qty 80
  Filled 2021-12-04 (×3): qty 100

## 2021-12-04 MED ORDER — ONDANSETRON HCL 4 MG PO TABS: 4.0000 mg | ORAL_TABLET | Freq: Four times a day (QID) | ORAL | Status: DC | PRN

## 2021-12-04 MED ORDER — PANTOPRAZOLE SODIUM 40 MG IV SOLR
INTRAVENOUS | Status: AC
  Filled 2021-12-04: qty 20

## 2021-12-04 MED ORDER — SODIUM CHLORIDE 0.9 % IV SOLN: Freq: Once | INTRAVENOUS | Status: AC

## 2021-12-04 MED ORDER — ENSURE ENLIVE PO LIQD
237.0000 mL | Freq: Two times a day (BID) | ORAL | Status: DC
  Administered 2021-12-06 – 2021-12-09 (×5): 237 mL via ORAL

## 2021-12-04 MED ORDER — BOOST / RESOURCE BREEZE PO LIQD CUSTOM
1.0000 | Freq: Three times a day (TID) | ORAL | Status: DC
Start: 2021-12-04 — End: 2021-12-05
  Administered 2021-12-04: 1 via ORAL

## 2021-12-04 MED ORDER — ESCITALOPRAM OXALATE 10 MG PO TABS
5.0000 mg | ORAL_TABLET | Freq: Every day | ORAL | Status: DC
  Administered 2021-12-04 – 2021-12-09 (×6): 5 mg via ORAL
  Filled 2021-12-04 (×6): qty 1

## 2021-12-04 MED ORDER — PANTOPRAZOLE 80MG IVPB - SIMPLE MED
80.0000 mg | Freq: Once | INTRAVENOUS | Status: AC
Start: 2021-12-04 — End: 2021-12-04
  Administered 2021-12-04: 80 mg via INTRAVENOUS
  Filled 2021-12-04: qty 100

## 2021-12-04 MED ORDER — INSULIN ASPART 100 UNIT/ML IJ SOLN
0.0000 [IU] | Freq: Three times a day (TID) | INTRAMUSCULAR | Status: DC
  Administered 2021-12-06: 2 [IU] via SUBCUTANEOUS
  Administered 2021-12-07 (×2): 1 [IU] via SUBCUTANEOUS
  Administered 2021-12-08 – 2021-12-09 (×3): 2 [IU] via SUBCUTANEOUS

## 2021-12-04 MED ORDER — PANTOPRAZOLE SODIUM 40 MG IV SOLR: 40.0000 mg | Freq: Two times a day (BID) | INTRAVENOUS | Status: DC

## 2021-12-04 MED ORDER — ONDANSETRON HCL 4 MG/2ML IJ SOLN: 4.0000 mg | Freq: Four times a day (QID) | INTRAMUSCULAR | Status: DC | PRN

## 2021-12-04 MED ORDER — ALBUTEROL SULFATE (2.5 MG/3ML) 0.083% IN NEBU
2.5000 mg | INHALATION_SOLUTION | RESPIRATORY_TRACT | Status: DC | PRN
  Administered 2021-12-07: 2.5 mg via RESPIRATORY_TRACT
  Filled 2021-12-04: qty 3

## 2021-12-04 NOTE — ED Provider Notes (Signed)
?[facility] ?Provider Note ? ? ?CSN: [id number] ?Arrival date & time: 12/04/21  1055 ? ?  ? ?History ? ?Chief Complaint  ?Patient presents with  ? Melena  ? ? ?Manuel [name] is a 76 y.o. male. ? ?Patient here with dark stools for the last 3 days.  On Eliquis for atrial fibrillation.  Patient also with history of gastric ulcer in the past, colon cancer, cirrhosis, CKD, diabetes.  Denies any abdominal pain, chest pain, shortness of breath.  Nothing has made it worse or better.  Has seen gastroenterology in the past for this.  He states he seen Dr. Benson Norway.  Denies any lightheadedness.  He is chronically on 3 L of oxygen. ? ?The history is provided by the patient.  ? ?  ? ?Home Medications ?Prior to Admission medications   ?Medication Sig Start Date End Date Taking? Authorizing Provider  ?acetaminophen (TYLENOL) 500 MG tablet Take 500 mg by mouth every 6 (six) hours as needed for moderate pain.    [provider]  ?albuterol (PROVENTIL) (2.5 MG/3ML) 0.083% nebulizer solution Take 3 mLs (2.5 mg total) by nebulization every 4 (four) hours as needed for wheezing or shortness of breath. 10/01/21 10/01/22  Roxan Hockey, MD  ?albuterol (VENTOLIN HFA) 108 (90 Base) MCG/ACT inhaler Inhale 2 puffs into the lungs every 4 (four) hours as needed for wheezing or shortness of breath. 10/01/21   Roxan Hockey, MD  ?apixaban (ELIQUIS) 5 MG TABS tablet Take 1 tablet (5 mg total) by mouth 2 (two) times daily. 10/01/21   Roxan Hockey, MD  ?D3-50 1.25 MG (50000 UT) capsule Take 50,000 Units by mouth every Wednesday. 05/08/21   [provider]  ?diltiazem (CARDIZEM CD) 240 MG 24 hr capsule Take 1 capsule (240 mg total) by mouth daily. 10/02/21   Roxan Hockey, MD  ?doxazosin (CARDURA) 4 MG tablet Take 1 tablet (4 mg total) by mouth at bedtime. 10/01/21   Roxan Hockey, MD  ?FARXIGA 10 MG TABS tablet Take 1 tablet (10 mg total) by mouth daily. 10/01/21   Roxan Hockey, MD  ?ferrous  sulfate 325 (65 FE) MG tablet Take 1 tablet (325 mg total) by mouth daily with breakfast. 10/01/21   Roxan Hockey, MD  ?furosemide (LASIX) 40 MG tablet Take 0.5 tablets (20 mg total) by mouth in the morning. 11/14/21   Elgergawy, Silver Huguenin, MD  ?metFORMIN (GLUCOPHAGE) 500 MG tablet Take 1 tablet (500 mg total) by mouth 2 (two) times daily with a meal. 10/01/21   Emokpae, Courage, MD  ?pantoprazole (PROTONIX) 40 MG tablet Take 1 tablet (40 mg total) by mouth daily. 11/14/21   Elgergawy, Silver Huguenin, MD  ?potassium chloride (KLOR-CON) 10 MEQ tablet Take 2 tablets (20 mEq total) by mouth daily. Take while Taking Laisx/Furosemide 10/01/21 12/30/21  Roxan Hockey, MD  ?pravastatin (PRAVACHOL) 20 MG tablet Take 20 mg by mouth every evening.  ?Patient not taking: Reported on 11/11/2021 01/03/16   [provider]  ?predniSONE (DELTASONE) 10 MG tablet Take 6 tablets by mouth on day 1. Take 5 tablets by mouth on day 2. Take 4 tablets by mouth on day 3. Take 3 tablets by mouth on day 4. Take 2 tablets on day 5, and take 1 tablet on day 6.  Take with food. 11/14/21   Elgergawy, Silver Huguenin, MD  ?vitamin B-12 (CYANOCOBALAMIN) 1000 MCG tablet Take 1,000 mcg by mouth 2 (two) times daily.    [provider]  ?   ? ?Allergies    ?  Sulfa antibiotics, Colchicine, Labetalol hcl, and Tramadol hcl   ? ?Review of Systems   ?Review of Systems ? ?Physical Exam ?Updated Vital Signs ?BP 100/64 (BP Location: Right Arm)   Pulse (!) 111   Temp 97.9 ?F (36.6 ?C)   Resp 14   Ht _0  (1.905 m)   Wt 81.3 kg   SpO2 92%   BMI 22.40 kg/m?  ?Physical Exam ?Vitals and nursing note reviewed.  ?Constitutional:   ?   General: He is not in acute distress. ?   Appearance: He is well-developed. He is not ill-appearing.  ?HENT:  ?   Head: Normocephalic and atraumatic.  ?   Mouth/Throat:  ?   Mouth: Mucous membranes are moist.  ?Eyes:  ?   Extraocular Movements: Extraocular movements intact.  ?   Conjunctiva/sclera: Conjunctivae normal.  ?    Pupils: Pupils are equal, round, and reactive to light.  ?Cardiovascular:  ?   Rate and Rhythm: Normal rate and regular rhythm.  ?   Pulses: Normal pulses.  ?   Heart sounds: No murmur heard. ?Pulmonary:  ?   Effort: Pulmonary effort is normal. No respiratory distress.  ?   Breath sounds: Normal breath sounds.  ?Abdominal:  ?   Palpations: Abdomen is soft.  ?   Tenderness: There is no abdominal tenderness.  ?Genitourinary: ?   Rectum: Guaiac result positive.  ?Musculoskeletal:     ?   General: No swelling.  ?   Cervical back: Neck supple.  ?Skin: ?   General: Skin is warm and dry.  ?   Capillary Refill: Capillary refill takes less than 2 seconds.  ?Neurological:  ?   Mental Status: He is alert.  ?Psychiatric:     ?   Mood and Affect: Mood normal.  ? ? ?ED Results / Procedures / Treatments   ?Labs ?(all labs ordered are listed, but only abnormal results are displayed) ?Labs Reviewed  ?CBC WITH DIFFERENTIAL/PLATELET - Abnormal; Notable for the following components:  ?    Result Value  ? WBC 3.8 (*)   ? RBC 3.34 (*)   ? Hemoglobin 10.0 (*)   ? HCT 33.0 (*)   ? RDW 16.0 (*)   ? Lymphs Abs 0.3 (*)   ? All other components within normal limits  ?COMPREHENSIVE METABOLIC PANEL - Abnormal; Notable for the following components:  ? CO2 35 (*)   ? Glucose, Bld 125 (*)   ? BUN 28 (*)   ? Creatinine, Ser 1.26 (*)   ? Total Protein 6.0 (*)   ? AST 10 (*)   ? GFR, Estimated 59 (*)   ? All other components within normal limits  ?OCCULT BLOOD X 1 CARD TO LAB, STOOL - Abnormal; Notable for the following components:  ? Fecal Occult Bld POSITIVE (*)   ? All other components within normal limits  ? ? ?EKG ?None ? ?Radiology ?No results found. ? ?Procedures ?Procedures  ? ? ?Medications Ordered in ED ?Medications  ?pantoprazole (PROTONIX) 80 mg /NS 100 mL IVPB (has no administration in time range)  ?pantoprozole (PROTONIX) 80 mg /NS 100 mL infusion (has no administration in time range)  ?pantoprazole (PROTONIX) injection 40 mg (has no  administration in time range)  ?pantoprazole (PROTONIX) 40 MG injection (has no administration in time range)  ?pantoprazole (PROTONIX) 40 MG injection (has no administration in time range)  ? ? ?ED Course/ Medical Decision Making/ A&P ?  ?                        ?  Medical Decision Making ?Amount and/or Complexity of Data Reviewed ?Labs: ordered. ? ?Risk ?Prescription drug management. ?Decision regarding hospitalization. ? ? ?ADDEN STROUT is here with melena.  Positive Hemoccult and positive black stool on exam.  Mildly tachycardic but otherwise normal vitals.  Appears to have GI bleed.  Upon chart review in 2019 he had EGD and colonoscopy with Dr. Benson Norway.  These showed a nonbleeding gastric ulcer.  He has no abdominal pain.  Have no concern for colitis or acute intra-abdominal process at this time.  Can hold off on imaging.  Hemoglobin is 10 down from 13 from last month.  Otherwise per my review and interpretation of labs today are unremarkable including CBC and CMP.  Hemoccult was positive.  I talked with Dr. Benson Norway and will admit for further care for GI bleed.  He is hemodynamically stable at this time.  We will start him on IV Protonix bolus and infusion.  We will admit him to hospitalist for further care. ? ?This chart was dictated using voice recognition software.  Despite best efforts to proofread,  errors can occur which can change the documentation meaning.  ? ? ? ? ? ? ? ?Final Clinical Impression(s) / ED Diagnoses ?Final diagnoses:  ?Gastrointestinal hemorrhage, unspecified gastrointestinal hemorrhage type  ? ? ?Rx / DC Orders ?ED Discharge Orders   ? ? None  ? ?  ? ? ?  ?Lennice Sites, DO ?12/04/21 1224 ? ?

## 2021-12-04 NOTE — ED Triage Notes (Signed)
Patient here POV from Home. ? ?Endorses Tarry Black Stool since Saturday. Constant since it began.  ? ?No Fevers. No N/V. No Pain. ? ?NAD Noted during Triage. A&Ox4. GCS 15. Ambulatory.  ?

## 2021-12-04 NOTE — H&P (View-Only) (Signed)
Reason for Consult:Melena and anemia. ?Referring Physician: [facility] hospitalist. ? ?Manuel [name] is an 76 y.o. male.  ?HPI: Manuel [name] is a 76 year old black widow with multiple medical problems listed below presented to the [facility] emergency room with a 3 due to history of melena and generalized weakness.  He denies having any abdominal pain nausea vomiting.  He denies any syncope or near syncopal events.  On evaluation in the emergency room he was found to have a drop in his hemoglobin from 13 g/dL and April to 10 mg/dL today. He denies having any fresh blood in the stool. He has had a sigmoid stricture in 2019 resulting in a bowel obstruction for which he had  Hartmann's pouch with a colostomy, which was subsequently taken down.  He has been on Eliquis for atrial fibrillation and this has been held since admission. He has a previous history of gastric ulcers diagnosed in 2019 when endoscopy was done ? ?Think he has had colon cancer past Medical History:  ?Diagnosis Date  ? AAA (abdominal aortic aneurysm) ([place])   ? per cardiologist note dated 03-14-2018 last duplex , 30-41m  (dr gEinar Gip  ? Acute GI bleeding 12/19/2017  ? Allergic rhinitis, seasonal   ? Anemia secondary to renal failure   ? Anticoagulant long-term use   ? ELIQUIS  ? Arthritis   ? LOWER BACK  ? B12 deficiency   ? BPH (benign prostatic hyperplasia)   ? Cirrhosis of liver ([facility] 10/04/2021  ? CKD (chronic kidney disease), stage III ([facility]   ? Colonic stricture ([facility] 12/13/2017  ? Colostomy present ([facility]   ? Congenital absence of left kidney [facility] dr detarding  ? per CT 12-11-2017 and 01-02-2018  ? COPD with hypoxia ([facility] 10/04/2021  ? Gastric ulcer 12/20/2017  ? Dyslipidemia   ? Esophageal candidiasis ([facility] 12/19/2017  ? Gastric ulcer without hemorrhage or perforation 12/20/2017  ? History of bowel resection 12/13/2017  ? for sigmoid stricture-- emergency sigmoidectomy  ? Hypercholesteremia 09/19/2018  ?  Hyperparathyroidism, secondary renal ([facility]   ? Hypertension   ? Left carotid bruit   ? OSA on CPAP   ? [facility]  ? Permanent atrial fibrillation ([facility] 2017  ? cardiologist-  dr gEinar Gip ? Solitary right kidney   ? Type 2 diabetes mellitus ([facility]   ? followed by pcp  ? Wears glasses   ? Wears partial dentures   ? UPPER AND LOWER  ? ?Past Surgical History:  ?Procedure Laterality Date  ? BIOPSY  12/19/2017  ? Procedure: BIOPSY;  Surgeon: HCarol Ada MD;  [facility]: [facility];  Service: Endoscopy;;  ? CARDIOVASCULAR STRESS TEST  11/16/2011  ? low risk nuclear study w/ no ischemia/  normal LV function and wall motion , ef 60%  ? CARDIOVERSION  12/18/2011  ? Procedure: CARDIOVERSION;  Surgeon: JLaverda Page MD;  [facility]  Service: Cardiovascular;  Laterality: N/A;  ? CATARACT EXTRACTION W/ INTRAOCULAR LENS IMPLANT Right 2015  ? COLON RESECTION N/A 12/13/2017  ? Procedure: LAPAROSCOPIC END COLOSTOMY HARTMAN'S PROCEDURE;  Surgeon: TLeighton Ruff MD;  Location: [facility];  Service: General;  Laterality: N/A;  ? COLOSTOMY TAKEDOWN N/A 05/29/2018  ? Procedure: LAPAROSCOPIC COLOSTOMY REVERSAL ERAS PATHWAY, RIGID PROCTOSCOPY;  Surgeon: TLeighton Ruff MD;  Location: [facility];  Service: General;  Laterality: N/A;  ? CYST REMOVED FROM UPPER BACK  09/2017  ? BENIGN  ? ESOPHAGOGASTRODUODENOSCOPY N/A 12/19/2017  ? Procedure: ESOPHAGOGASTRODUODENOSCOPY (EGD);  Surgeon: Carol Ada, MD;  Location: [facility];  Service: Endoscopy;  Laterality: N/A;  ? FLEXIBLE SIGMOIDOSCOPY N/A 12/13/2017  ? Procedure: FLEXIBLE SIGMOIDOSCOPY;  Surgeon: Carol Ada, MD;  Location: [facility];  Service: Endoscopy;  Laterality: N/A;  ? INGUINAL HERNIA REPAIR Left 1980s  ? AND REMOVAL CYST ON UPPER BACK  ? PAROTIDECTOMY Right 09-21-1999   dr Janace Hoard _0   ? salivery gland mass  (benign warthin's tumor)  ? RIGHT HEART CATH N/A 08/22/2021  ? Procedure: RIGHT HEART CATH;  Surgeon: Adrian Prows, MD;  Location: [facility];   Service: Cardiovascular;  Laterality: N/A;  ? TRANSTHORACIC ECHOCARDIOGRAM  01/04/2018  ? mild LVH, ef 37-90%, grade 2 diastolic function/  trivial AR and MR/  mild LAE/  mild to moderate TR  ? ?Family History  ?Problem Relation Age of Onset  ? Cancer Mother   ? Cancer Father   ? Alzheimer's disease Father   ? Diabetes Brother   ? Kidney disease Paternal Aunt   ? Sleep apnea Neg Hx   ? ?Social History:  reports that he quit smoking about 16 months ago. His smoking use included cigarettes. He has a 15.00 pack-year smoking history. He has never used smokeless tobacco. He reports that he does not drink alcohol and does not use drugs. ? ?Allergies:  ?Allergies  ?Allergen Reactions  ? Sulfa Antibiotics Shortness Of Breath  ?  Headaches   ? Colchicine   ?  Other reaction(s): diarrhea  ? Labetalol Hcl   ?  Other reaction(s): dysuria  ? Tramadol Hcl   ?  Other reaction(s): nausea  ? ?Medications: I have reviewed the patient's current medications. ?Prior to Admission:  ?Medications Prior to Admission  ?Medication Sig Dispense Refill Last Dose  ? acetaminophen (TYLENOL) 500 MG tablet Take 500 mg by mouth every 6 (six) hours as needed for moderate pain.   unknown  ? albuterol (PROVENTIL) (2.5 MG/3ML) 0.083% nebulizer solution Take 3 mLs (2.5 mg total) by nebulization every 4 (four) hours as needed for wheezing or shortness of breath. 75 mL 2 unknown  ? albuterol (VENTOLIN HFA) 108 (90 Base) MCG/ACT inhaler Inhale 2 puffs into the lungs every 4 (four) hours as needed for wheezing or shortness of breath. 1 each 2 Past Week  ? apixaban (ELIQUIS) 5 MG TABS tablet Take 1 tablet (5 mg total) by mouth 2 (two) times daily. 180 tablet 3 12/03/2021 at 2030  ? D3-50 1.25 MG (50000 UT) capsule Take 50,000 Units by mouth every Wednesday.   11/29/2021  ? diltiazem (CARDIZEM CD) 240 MG 24 hr capsule Take 1 capsule (240 mg total) by mouth daily. 90 capsule 3 12/03/2021  ? doxazosin (CARDURA) 4 MG tablet Take 1 tablet (4 mg total) by mouth at  bedtime. 90 tablet 3 12/03/2021  ? escitalopram (LEXAPRO) 5 MG tablet Take 5 mg by mouth daily.   12/03/2021  ? FARXIGA 10 MG TABS tablet Take 1 tablet (10 mg total) by mouth daily. 90 tablet 3 12/03/2021  ? ferrous sulfate 325 (65 FE) MG tablet Take 1 tablet (325 mg total) by mouth daily with breakfast. 30 tablet 3 12/03/2021  ? furosemide (LASIX) 40 MG tablet Take 0.5 tablets (20 mg total) by mouth in the morning. (Patient taking differently: Take 40 mg by mouth in the morning.) 90 tablet 1 12/03/2021  ? metFORMIN (GLUCOPHAGE) 500 MG tablet Take 1 tablet (500 mg total) by mouth 2 (two) times daily with a meal. 180 tablet 3 12/03/2021  ?  pantoprazole (PROTONIX) 40 MG tablet Take 1 tablet (40 mg total) by mouth daily. 30 tablet 0 12/03/2021  ? potassium chloride (KLOR-CON) 10 MEQ tablet Take 2 tablets (20 mEq total) by mouth daily. Take while Taking Laisx/Furosemide 60 tablet 2 12/03/2021  ? vitamin B-12 (CYANOCOBALAMIN) 1000 MCG tablet Take 1,000 mcg by mouth 2 (two) times daily.   12/03/2021  ? predniSONE (DELTASONE) 10 MG tablet Take 6 tablets by mouth on day 1. Take 5 tablets by mouth on day 2. Take 4 tablets by mouth on day 3. Take 3 tablets by mouth on day 4. Take 2 tablets on day 5, and take 1 tablet on day 6.  Take with food. (Patient not taking: Reported on 12/04/2021) 21 tablet 0 Completed Course  ? ?Scheduled: ? [START ON 12/05/2021] doxazosin  4 mg Oral QHS  ? escitalopram  5 mg Oral Daily  ? feeding supplement  237 mL Oral BID BM  ? [START ON 12/05/2021] insulin aspart  0-9 Units Subcutaneous TID WC  ? pantoprazole      ? pantoprazole      ? [START ON 12/08/2021] pantoprazole  40 mg Intravenous Q12H  ? ?Continuous: ? sodium chloride    ? pantoprazole 8 mg/hr (12/04/21 1307)  ? ?FQM:[id number], ondansetron **OR** ondansetron (ZOFRAN) IV ? ?Results for orders placed or performed during the hospital encounter of 12/04/21 (from the past 48 hour(s))  ?CBC with Differential     Status: Abnormal  ? Collection Time: 12/04/21  11:18 AM  ?Result Value Ref Range  ? WBC 3.8 (L) 4.0 - 10.5 K/uL  ? RBC 3.34 (L) 4.22 - 5.81 MIL/uL  ? Hemoglobin 10.0 (L) 13.0 - 17.0 g/dL  ? HCT 33.0 (L) 39.0 - 52.0 %  ? MCV 98.8 80.0 - 100.0 fL  ?

## 2021-12-04 NOTE — Progress Notes (Signed)
[id number]  [facility]) Hospital Liaison note: ? ?This patient is currently enrolled in [facility] outpatient-based Palliative Care. Will continue to follow for disposition. ? ?Please call with any outpatient palliative questions or concerns. ? ?Thank you, ?Lorelee Market, LPN ?[facility] Liaison ?[phone] ?

## 2021-12-04 NOTE — H&P (Signed)
?History and Physical  ? ? ?Patient: Manuel [name] TFT:[id number] DOB: 1945/09/17 ?DOA: 12/04/2021 ?DOS: the patient was seen and examined on 12/04/2021 ?PCP: Manuel Morning, DO  ?Patient coming from: Home ? ?Chief Complaint:  ?Chief Complaint  ?Patient presents with  ? Melena  ? ?HPI: Manuel [name] is a 76 y.o. male with medical history significant of OSA on CPAP, HLD, HTN, chronic hypoxia respiratory failure, COPD, cirrhosis of liver, BPH, HTN, a fib on eliquis. Presenting with dark stools. He's had dark, loose stools for the past 2 days. He hasn't had any stomach or rectal pain. He hasn't had fever, N/V. He denies NSAID or EtOH use. He reports consistent use of his eliquis. When his symptoms didn't improve this [name] and he felt worsening weakness, he came to the ED for assistance. He denies any other aggravating or alleviating factors.   ? ?Review of Systems: As mentioned in the history of present illness. All other systems reviewed and are negative. ?Past Medical History:  ?Diagnosis Date  ? AAA (abdominal aortic aneurysm) ([place])   ? per cardiologist note dated 03-14-2018 last duplex , 30-39m  (dr gEinar Gip  ? Acute GI bleeding 12/19/2017  ? Allergic rhinitis, seasonal   ? Anemia secondary to renal failure   ? Anticoagulant long-term use   ? ELIQUIS  ? Arthritis   ? LOWER BACK  ? B12 deficiency   ? BPH (benign prostatic hyperplasia)   ? Cirrhosis of liver ([facility] 10/04/2021  ? CKD (chronic kidney disease), stage III ([facility] [facility]   ? Colon cancer ([facility] 05/29/2018  ? No confirmed pathology indicating colon cancer.  Reports indicate benign findings on colonoscopy.  Had pneumatosis intestinalis followed by [facility] with colostomy and reversal.  KDamaris HippoFNP-C  ? Colonic stricture ([place] 12/13/2017  ? Colostomy present ([facility]   ? Congenital absence of left kidney [facility]  ? per CT 12-11-2017 and 01-02-2018  ? COPD with hypoxia ([facility] 10/04/2021  ? Duodenal ulcer 12/20/2017  ?  Dyslipidemia   ? Esophageal candidiasis ([facility] 12/19/2017  ? Gastric ulcer without hemorrhage or perforation 12/20/2017  ? History of bowel resection 12/13/2017  ? for sigmoid stricture-- emergency sigmoidectomy  ? Hypercholesteremia 09/19/2018  ? Hyperparathyroidism, secondary renal (HM[place]   ? Hypertension   ? Left carotid bruit   ? OSA on CPAP   ? [facility]  ? Permanent atrial fibrillation ([facility] 2017  ? cardiologist-  dr gEinar Gip ? Solitary right kidney   ? Type 2 diabetes mellitus ([facility]   ? followed by pcp  ? Wears glasses   ? Wears partial dentures   ? UPPER AND LOWER  ? ?Past Surgical History:  ?Procedure Laterality Date  ? BIOPSY  12/19/2017  ? Procedure: BIOPSY;  Surgeon: HCarol Ada MD;  [facility];  Service: Endoscopy;;  ? CARDIOVASCULAR STRESS TEST  11/16/2011  ? low risk nuclear study w/ no ischemia/  normal LV function and wall motion , ef 60%  ? CARDIOVERSION  12/18/2011  ? Procedure: CARDIOVERSION;  Surgeon: JLaverda Page MD;  [place]  Service: Cardiovascular;  Laterality: N/A;  ? CATARACT EXTRACTION W/ INTRAOCULAR LENS IMPLANT Right 2015  ? COLON RESECTION N/A 12/13/2017  ? Procedure: LAPAROSCOPIC END COLOSTOMY HARTMAN'S PROCEDURE;  Surgeon: TLeighton Ruff MD;  [facility];  Service: General;  Laterality: N/A;  ? COLOSTOMY TAKEDOWN N/A 05/29/2018  ? Procedure: LAPAROSCOPIC COLOSTOMY REVERSAL ERAS PATHWAY, RIGID PROCTOSCOPY;  Surgeon: TMarcello Moores  Elmo Putt, MD;  [facility];  Service: General;  Laterality: N/A;  ? CYST REMOVED FROM UPPER BACK  09/2017  ? BENIGN  ? ESOPHAGOGASTRODUODENOSCOPY N/A 12/19/2017  ? Procedure: ESOPHAGOGASTRODUODENOSCOPY (EGD);  Surgeon: Carol Ada, MD;  [facility];  Service: Endoscopy;  Laterality: N/A;  ? FLEXIBLE SIGMOIDOSCOPY N/A 12/13/2017  ? Procedure: FLEXIBLE SIGMOIDOSCOPY;  Surgeon: Carol Ada, MD;  Location[facility];  Service: Endoscopy;  Laterality: N/A;  ? INGUINAL HERNIA REPAIR Left 1980s  ? AND  REMOVAL CYST ON UPPER BACK  ? PAROTIDECTOMY Right 09-21-1999   dr Janace Hoard _0   ? salivery gland mass  (benign warthin's tumor)  ? RIGHT HEART CATH N/A 08/22/2021  ? Procedure: RIGHT HEART CATH;  Surgeon: Adrian Prows, MD;  Location: [place] [facility];  Service: Cardiovascular;  Laterality: N/A;  ? TRANSTHORACIC ECHOCARDIOGRAM  01/04/2018  ? mild LVH, ef 21-30%, grade 2 diastolic function/  trivial AR and MR/  mild LAE/  mild to moderate TR  ? ?Social History:  reports that he quit smoking about 16 months ago. His smoking use included cigarettes. He has a 15.00 pack-year smoking history. He has never used smokeless tobacco. He reports that he does not drink alcohol and does not use drugs. ? ?Allergies  ?Allergen Reactions  ? Sulfa Antibiotics Shortness Of Breath  ?  Headaches   ? Colchicine   ?  Other reaction(s): diarrhea  ? Labetalol Hcl   ?  Other reaction(s): dysuria  ? Tramadol Hcl   ?  Other reaction(s): nausea  ? ? ?Family History  ?Problem Relation Age of Onset  ? Cancer Mother   ? Cancer Father   ? Alzheimer's disease Father   ? Diabetes Brother   ? Kidney disease Paternal Aunt   ? Sleep apnea Neg Hx   ? ? ?Prior to Admission medications   ?Medication Sig Start Date End Date Taking? Authorizing Provider  ?acetaminophen (TYLENOL) 500 MG tablet Take 500 mg by mouth every 6 (six) hours as needed for moderate pain.    [provider]  ?albuterol (PROVENTIL) (2.5 MG/3ML) 0.083% nebulizer solution Take 3 mLs (2.5 mg total) by nebulization every 4 (four) hours as needed for wheezing or shortness of breath. 10/01/21 10/01/22  Roxan Hockey, MD  ?albuterol (VENTOLIN HFA) 108 (90 Base) MCG/ACT inhaler Inhale 2 puffs into the lungs every 4 (four) hours as needed for wheezing or shortness of breath. 10/01/21   Roxan Hockey, MD  ?apixaban (ELIQUIS) 5 MG TABS tablet Take 1 tablet (5 mg total) by mouth 2 (two) times daily. 10/01/21   Roxan Hockey, MD  ?D3-50 1.25 MG (50000 UT) capsule Take 50,000 Units by  mouth every Wednesday. 05/08/21   [provider]  ?diltiazem (CARDIZEM CD) 240 MG 24 hr capsule Take 1 capsule (240 mg total) by mouth daily. 10/02/21   Roxan Hockey, MD  ?doxazosin (CARDURA) 4 MG tablet Take 1 tablet (4 mg total) by mouth at bedtime. 10/01/21   Roxan Hockey, MD  ?FARXIGA 10 MG TABS tablet Take 1 tablet (10 mg total) by mouth daily. 10/01/21   Roxan Hockey, MD  ?ferrous sulfate 325 (65 FE) MG tablet Take 1 tablet (325 mg total) by mouth daily with breakfast. 10/01/21   Roxan Hockey, MD  ?furosemide (LASIX) 40 MG tablet Take 0.5 tablets (20 mg total) by mouth in the [name]. 11/14/21   Elgergawy, Silver Huguenin, MD  ?metFORMIN (GLUCOPHAGE) 500 MG tablet Take 1 tablet (500 mg total) by mouth 2 (two) times daily  with a meal. 10/01/21   Emokpae, Courage, MD  ?pantoprazole (PROTONIX) 40 MG tablet Take 1 tablet (40 mg total) by mouth daily. 11/14/21   Elgergawy, Silver Huguenin, MD  ?potassium chloride (KLOR-CON) 10 MEQ tablet Take 2 tablets (20 mEq total) by mouth daily. Take while Taking Laisx/Furosemide 10/01/21 12/30/21  Roxan Hockey, MD  ?pravastatin (PRAVACHOL) 20 MG tablet Take 20 mg by mouth every evening.  ?Patient not taking: Reported on 11/11/2021 01/03/16   [provider]  ?predniSONE (DELTASONE) 10 MG tablet Take 6 tablets by mouth on day 1. Take 5 tablets by mouth on day 2. Take 4 tablets by mouth on day 3. Take 3 tablets by mouth on day 4. Take 2 tablets on day 5, and take 1 tablet on day 6.  Take with food. 11/14/21   Elgergawy, Silver Huguenin, MD  ?vitamin B-12 (CYANOCOBALAMIN) 1000 MCG tablet Take 1,000 mcg by mouth 2 (two) times daily.    [provider]  ? ? ?Physical Exam: ?Vitals:  ? 12/04/21 1300 12/04/21 1315 12/04/21 1506 12/04/21 1507  ?BP:  126/73  106/75  ?Pulse: 91 97  96  ?Resp:  17  16  ?Temp:    98.3 ?F (36.8 ?C)  ?TempSrc:    Oral  ?SpO2: 98% 96%  99%  ?Weight:      ?Height:   _0  (1.905 m)   ? ?General: 76 y.o. male resting in bed in NAD ?Eyes: PERRL,  normal sclera ?ENMT: Nares patent w/o discharge, orophaynx clear, dentition normal, ears w/o discharge/lesions/ulcers ?Neck: Supple, trachea midline ?Cardiovascular: tachy, +S1, S2, no m/g/r, equal pulses thr

## 2021-12-04 NOTE — ED Notes (Signed)
Report given to carelink  °

## 2021-12-04 NOTE — Progress Notes (Signed)
Plan of Care Note for accepted transfer ? ? ?Patient: Manuel [name] MRN: [id number]   DOA: 12/04/2021 ? ?Facility requesting transfer: [facility] ?Requesting Provider: Dr. Ronnald Nian ?Reason for transfer: GIB ?Facility course: 76 yo M w/ history of afib on eliquis and COPD on 3L [place]. Presenting with melena. Hgb down to 10 from 13 a month ago. History of gastric ulcer in the past. On PPI. Dr. Benson Norway consulted.   ? ?Plan of care: ?The patient is accepted for admission to Progressive unit, at [facility]..  ?While holding at [facility]), medical decision making for this patient will remain the responsibility of the EDP. Upon arrival to [facility], [facility] will assume care.  ? ?Author: ?Jonnie Finner, DO ?12/04/2021 ? ?Check [email or web address] for on-call coverage. ? ?Nursing staff, Please call [place] number on Amion as soon as patient's arrival, so appropriate admitting provider can evaluate the pt. ? ?

## 2021-12-04 NOTE — ED Notes (Signed)
Attempted to call report was told to call back in 10 minutes ?

## 2021-12-04 NOTE — Telephone Encounter (Signed)
Pt's wife called and said that the patient is having blood in stools.  Returned call to pt and wife.  He states not feeling well for the prior 2 days, has black, tarry stools.  He has lightheadedness.  Advised pt and wife to be NPO including meds to allow for testing if needed.  Damaris Hippo FNP-C ?

## 2021-12-04 NOTE — Consult Note (Signed)
Reason for Consult:Melena and anemia. ?Referring Physician: [facility] hospitalist. ? ?Manuel [name] is an 76 y.o. male.  ?HPI: Manuel [name] is a 76 year old black widow with multiple medical problems listed below presented to the [facility] emergency room with a 3 due to history of melena and generalized weakness.  He denies having any abdominal pain nausea vomiting.  He denies any syncope or near syncopal events.  On evaluation in the emergency room he was found to have a drop in his hemoglobin from 13 g/dL and April to 10 mg/dL today. He denies having any fresh blood in the stool. He has had a sigmoid stricture in 2019 resulting in a bowel obstruction for which he had  Hartmann's pouch with a colostomy, which was subsequently taken down.  He has been on Eliquis for atrial fibrillation and this has been held since admission. He has a previous history of gastric ulcers diagnosed in 2019 when endoscopy was done ? ?Think he has had colon cancer past Medical History:  ?Diagnosis Date  ? AAA (abdominal aortic aneurysm) ([place])   ? per cardiologist note dated 03-14-2018 last duplex , 30-48m  (dr gEinar Gip  ? Acute GI bleeding 12/19/2017  ? Allergic rhinitis, seasonal   ? Anemia secondary to renal failure   ? Anticoagulant long-term use   ? ELIQUIS  ? Arthritis   ? LOWER BACK  ? B12 deficiency   ? BPH (benign prostatic hyperplasia)   ? Cirrhosis of liver ([facility] 10/04/2021  ? CKD (chronic kidney disease), stage III ([facility]   ? Colonic stricture ([facility] 12/13/2017  ? Colostomy present ([facility]   ? Congenital absence of left kidney [facility] dr detarding  ? per CT 12-11-2017 and 01-02-2018  ? COPD with hypoxia ([facility] 10/04/2021  ? Gastric ulcer 12/20/2017  ? Dyslipidemia   ? Esophageal candidiasis ([facility] 12/19/2017  ? Gastric ulcer without hemorrhage or perforation 12/20/2017  ? History of bowel resection 12/13/2017  ? for sigmoid stricture-- emergency sigmoidectomy  ? Hypercholesteremia 09/19/2018  ?  Hyperparathyroidism, secondary renal ([place]   ? Hypertension   ? Left carotid bruit   ? OSA on CPAP   ? [facility]  ? Permanent atrial fibrillation ([facility] 2017  ? cardiologist-  dr gEinar Gip ? Solitary right kidney   ? Type 2 diabetes mellitus ([facility]   ? followed by pcp  ? Wears glasses   ? Wears partial dentures   ? UPPER AND LOWER  ? ?Past Surgical History:  ?Procedure Laterality Date  ? BIOPSY  12/19/2017  ? Procedure: BIOPSY;  Surgeon: HCarol Ada MD;  [facility];  Service: Endoscopy;;  ? CARDIOVASCULAR STRESS TEST  11/16/2011  ? low risk nuclear study w/ no ischemia/  normal LV function and wall motion , ef 60%  ? CARDIOVERSION  12/18/2011  ? Procedure: CARDIOVERSION;  Surgeon: JLaverda Page MD;  [facility]  Service: Cardiovascular;  Laterality: N/A;  ? CATARACT EXTRACTION W/ INTRAOCULAR LENS IMPLANT Right 2015  ? COLON RESECTION N/A 12/13/2017  ? Procedure: LAPAROSCOPIC END COLOSTOMY HARTMAN'S PROCEDURE;  Surgeon: TLeighton Ruff MD;  [facility];  Service: General;  Laterality: N/A;  ? COLOSTOMY TAKEDOWN N/A 05/29/2018  ? Procedure: LAPAROSCOPIC COLOSTOMY REVERSAL ERAS PATHWAY, RIGID PROCTOSCOPY;  Surgeon: TLeighton Ruff MD;  [facility];  Service: General;  Laterality: N/A;  ? CYST REMOVED FROM UPPER BACK  09/2017  ? BENIGN  ? ESOPHAGOGASTRODUODENOSCOPY N/A 12/19/2017  ? Procedure: ESOPHAGOGASTRODUODENOSCOPY (EGD);  Surgeon: Carol Ada, MD;  [facility];  Service: Endoscopy;  Laterality: N/A;  ? FLEXIBLE SIGMOIDOSCOPY N/A 12/13/2017  ? Procedure: FLEXIBLE SIGMOIDOSCOPY;  Surgeon: Carol Ada, MD;  [facility];  Service: Endoscopy;  Laterality: N/A;  ? INGUINAL HERNIA REPAIR Left 1980s  ? AND REMOVAL CYST ON UPPER BACK  ? PAROTIDECTOMY Right 09-21-1999   dr Janace Hoard _0   ? salivery gland mass  (benign warthin's tumor)  ? RIGHT HEART CATH N/A 08/22/2021  ? Procedure: RIGHT HEART CATH;  Surgeon: Adrian Prows, MD;  Location: [place] [facility];   Service: Cardiovascular;  Laterality: N/A;  ? TRANSTHORACIC ECHOCARDIOGRAM  01/04/2018  ? mild LVH, ef 41-32%, grade 2 diastolic function/  trivial AR and MR/  mild LAE/  mild to moderate TR  ? ?Family History  ?Problem Relation Age of Onset  ? Cancer Mother   ? Cancer Father   ? Alzheimer's disease Father   ? Diabetes Brother   ? Kidney disease Paternal Aunt   ? Sleep apnea Neg Hx   ? ?Social History:  reports that he quit smoking about 16 months ago. His smoking use included cigarettes. He has a 15.00 pack-year smoking history. He has never used smokeless tobacco. He reports that he does not drink alcohol and does not use drugs. ? ?Allergies:  ?Allergies  ?Allergen Reactions  ? Sulfa Antibiotics Shortness Of Breath  ?  Headaches   ? Colchicine   ?  Other reaction(s): diarrhea  ? Labetalol Hcl   ?  Other reaction(s): dysuria  ? Tramadol Hcl   ?  Other reaction(s): nausea  ? ?Medications: I have reviewed the patient's current medications. ?Prior to Admission:  ?Medications Prior to Admission  ?Medication Sig Dispense Refill Last Dose  ? acetaminophen (TYLENOL) 500 MG tablet Take 500 mg by mouth every 6 (six) hours as needed for moderate pain.   unknown  ? albuterol (PROVENTIL) (2.5 MG/3ML) 0.083% nebulizer solution Take 3 mLs (2.5 mg total) by nebulization every 4 (four) hours as needed for wheezing or shortness of breath. 75 mL 2 unknown  ? albuterol (VENTOLIN HFA) 108 (90 Base) MCG/ACT inhaler Inhale 2 puffs into the lungs every 4 (four) hours as needed for wheezing or shortness of breath. 1 each 2 Past Week  ? apixaban (ELIQUIS) 5 MG TABS tablet Take 1 tablet (5 mg total) by mouth 2 (two) times daily. 180 tablet 3 12/03/2021 at 2030  ? D3-50 1.25 MG (50000 UT) capsule Take 50,000 Units by mouth every Wednesday.   11/29/2021  ? diltiazem (CARDIZEM CD) 240 MG 24 hr capsule Take 1 capsule (240 mg total) by mouth daily. 90 capsule 3 12/03/2021  ? doxazosin (CARDURA) 4 MG tablet Take 1 tablet (4 mg total) by mouth at  bedtime. 90 tablet 3 12/03/2021  ? escitalopram (LEXAPRO) 5 MG tablet Take 5 mg by mouth daily.   12/03/2021  ? FARXIGA 10 MG TABS tablet Take 1 tablet (10 mg total) by mouth daily. 90 tablet 3 12/03/2021  ? ferrous sulfate 325 (65 FE) MG tablet Take 1 tablet (325 mg total) by mouth daily with breakfast. 30 tablet 3 12/03/2021  ? furosemide (LASIX) 40 MG tablet Take 0.5 tablets (20 mg total) by mouth in the morning. (Patient taking differently: Take 40 mg by mouth in the morning.) 90 tablet 1 12/03/2021  ? metFORMIN (GLUCOPHAGE) 500 MG tablet Take 1 tablet (500 mg total) by mouth 2 (two) times daily with a meal. 180 tablet 3 12/03/2021  ?  pantoprazole (PROTONIX) 40 MG tablet Take 1 tablet (40 mg total) by mouth daily. 30 tablet 0 12/03/2021  ? potassium chloride (KLOR-CON) 10 MEQ tablet Take 2 tablets (20 mEq total) by mouth daily. Take while Taking Laisx/Furosemide 60 tablet 2 12/03/2021  ? vitamin B-12 (CYANOCOBALAMIN) 1000 MCG tablet Take 1,000 mcg by mouth 2 (two) times daily.   12/03/2021  ? predniSONE (DELTASONE) 10 MG tablet Take 6 tablets by mouth on day 1. Take 5 tablets by mouth on day 2. Take 4 tablets by mouth on day 3. Take 3 tablets by mouth on day 4. Take 2 tablets on day 5, and take 1 tablet on day 6.  Take with food. (Patient not taking: Reported on 12/04/2021) 21 tablet 0 Completed Course  ? ?Scheduled: ? [START ON 12/05/2021] doxazosin  4 mg Oral QHS  ? escitalopram  5 mg Oral Daily  ? feeding supplement  237 mL Oral BID BM  ? [START ON 12/05/2021] insulin aspart  0-9 Units Subcutaneous TID WC  ? pantoprazole      ? pantoprazole      ? [START ON 12/08/2021] pantoprazole  40 mg Intravenous Q12H  ? ?Continuous: ? sodium chloride    ? pantoprazole 8 mg/hr (12/04/21 1307)  ? ?[facility]:[id number], ondansetron **OR** ondansetron (ZOFRAN) IV ? ?Results for orders placed or performed during the hospital encounter of 12/04/21 (from the past 48 hour(s))  ?CBC with Differential     Status: Abnormal  ? Collection Time: 12/04/21  11:18 AM  ?Result Value Ref Range  ? WBC 3.8 (L) 4.0 - 10.5 K/uL  ? RBC 3.34 (L) 4.22 - 5.81 MIL/uL  ? Hemoglobin 10.0 (L) 13.0 - 17.0 g/dL  ? HCT 33.0 (L) 39.0 - 52.0 %  ? MCV 98.8 80.0 - 100.0 fL  ?

## 2021-12-05 ENCOUNTER — Encounter (HOSPITAL_COMMUNITY): Payer: Self-pay | Admitting: Internal Medicine

## 2021-12-05 ENCOUNTER — Inpatient Hospital Stay (HOSPITAL_COMMUNITY): Payer: Medicare HMO | Admitting: Anesthesiology

## 2021-12-05 ENCOUNTER — Encounter (HOSPITAL_COMMUNITY)
Admission: EM | Disposition: A | Discharge status: Home / Self Care | Payer: Self-pay | Source: Home / Self Care | Attending: Internal Medicine

## 2021-12-05 DIAGNOSIS — Z9989 Dependence on other enabling machines and devices: Secondary | ICD-10-CM | POA: Diagnosis not present

## 2021-12-05 DIAGNOSIS — I10 Essential (primary) hypertension: Secondary | ICD-10-CM | POA: Diagnosis not present

## 2021-12-05 DIAGNOSIS — I4821 Permanent atrial fibrillation: Secondary | ICD-10-CM | POA: Diagnosis not present

## 2021-12-05 DIAGNOSIS — I5032 Chronic diastolic (congestive) heart failure: Secondary | ICD-10-CM | POA: Diagnosis not present

## 2021-12-05 DIAGNOSIS — K922 Gastrointestinal hemorrhage, unspecified: Secondary | ICD-10-CM | POA: Diagnosis not present

## 2021-12-05 DIAGNOSIS — D62 Acute posthemorrhagic anemia: Secondary | ICD-10-CM | POA: Diagnosis not present

## 2021-12-05 DIAGNOSIS — J449 Chronic obstructive pulmonary disease, unspecified: Secondary | ICD-10-CM | POA: Diagnosis not present

## 2021-12-05 DIAGNOSIS — Z7984 Long term (current) use of oral hypoglycemic drugs: Secondary | ICD-10-CM | POA: Diagnosis not present

## 2021-12-05 DIAGNOSIS — N400 Enlarged prostate without lower urinary tract symptoms: Secondary | ICD-10-CM | POA: Diagnosis not present

## 2021-12-05 DIAGNOSIS — E119 Type 2 diabetes mellitus without complications: Secondary | ICD-10-CM | POA: Diagnosis not present

## 2021-12-05 DIAGNOSIS — K2971 Gastritis, unspecified, with bleeding: Secondary | ICD-10-CM

## 2021-12-05 DIAGNOSIS — E43 Unspecified severe protein-calorie malnutrition: Secondary | ICD-10-CM

## 2021-12-05 DIAGNOSIS — J9611 Chronic respiratory failure with hypoxia: Secondary | ICD-10-CM

## 2021-12-05 DIAGNOSIS — K2970 Gastritis, unspecified, without bleeding: Secondary | ICD-10-CM | POA: Diagnosis not present

## 2021-12-05 DIAGNOSIS — D72819 Decreased white blood cell count, unspecified: Secondary | ICD-10-CM

## 2021-12-05 DIAGNOSIS — G4733 Obstructive sleep apnea (adult) (pediatric): Secondary | ICD-10-CM | POA: Diagnosis not present

## 2021-12-05 DIAGNOSIS — K921 Melena: Secondary | ICD-10-CM | POA: Diagnosis not present

## 2021-12-05 DIAGNOSIS — Z87891 Personal history of nicotine dependence: Secondary | ICD-10-CM

## 2021-12-05 HISTORY — PX: ESOPHAGOGASTRODUODENOSCOPY (EGD) WITH PROPOFOL: SHX5813

## 2021-12-05 LAB — COMPREHENSIVE METABOLIC PANEL
ALT: 9 U/L (ref 0–44)
AST: 10 U/L — ABNORMAL LOW (ref 15–41)
Albumin: 2.5 g/dL — ABNORMAL LOW (ref 3.5–5.0)
Alkaline Phosphatase: 49 U/L (ref 38–126)
Anion gap: 5 (ref 5–15)
BUN: 19 mg/dL (ref 8–23)
CO2: 37 mmol/L — ABNORMAL HIGH (ref 22–32)
Calcium: 8.4 mg/dL — ABNORMAL LOW (ref 8.9–10.3)
Chloride: 102 mmol/L (ref 98–111)
Creatinine, Ser: 1.09 mg/dL (ref 0.61–1.24)
GFR, Estimated: >60 mL/min (ref >60)
Glucose, Bld: 93 mg/dL (ref 70–99)
Potassium: 4.1 mmol/L (ref 3.5–5.1)
Sodium: 144 mmol/L (ref 135–145)
Total Bilirubin: 0.9 mg/dL (ref 0.3–1.2)
Total Protein: 5 g/dL — ABNORMAL LOW (ref 6.5–8.1)

## 2021-12-05 LAB — GLUCOSE, CAPILLARY
Glucose-Capillary: 101 mg/dL — ABNORMAL HIGH (ref 70–99)
Glucose-Capillary: 106 mg/dL — ABNORMAL HIGH (ref 70–99)
Glucose-Capillary: 79 mg/dL (ref 70–99)
Glucose-Capillary: 95 mg/dL (ref 70–99)
Glucose-Capillary: 89 mg/dL (ref 70–99)

## 2021-12-05 LAB — HEMOGLOBIN AND HEMATOCRIT, BLOOD
HCT: 28.9 % — ABNORMAL LOW (ref 39.0–52.0)
Hemoglobin: 8.8 g/dL — ABNORMAL LOW (ref 13.0–17.0)

## 2021-12-05 LAB — CBC
HCT: 29.6 % — ABNORMAL LOW (ref 39.0–52.0)
Hemoglobin: 8.8 g/dL — ABNORMAL LOW (ref 13.0–17.0)
MCH: 30.8 pg (ref 26.0–34.0)
MCHC: 29.7 g/dL — ABNORMAL LOW (ref 30.0–36.0)
MCV: 103.5 fL — ABNORMAL HIGH (ref 80.0–100.0)
Platelets: 155 10*3/uL (ref 150–400)
RBC: 2.86 MIL/uL — ABNORMAL LOW (ref 4.22–5.81)
RDW: 16.2 % — ABNORMAL HIGH (ref 11.5–15.5)
WBC: 2.9 10*3/uL — ABNORMAL LOW (ref 4.0–10.5)
nRBC: 0 % (ref 0.0–0.2)

## 2021-12-05 SURGERY — ESOPHAGOGASTRODUODENOSCOPY (EGD) WITH PROPOFOL
Anesthesia: Monitor Anesthesia Care

## 2021-12-05 MED ORDER — ADULT MULTIVITAMIN W/MINERALS CH
1.0000 | ORAL_TABLET | Freq: Every day | ORAL | Status: DC
  Administered 2021-12-07 – 2021-12-09 (×3): 1 via ORAL
  Filled 2021-12-05 (×3): qty 1

## 2021-12-05 MED ORDER — PEG 3350-KCL-NA BICARB-NACL 420 G PO SOLR
4000.0000 mL | Freq: Once | ORAL | Status: AC
  Administered 2021-12-05: 4000 mL via ORAL

## 2021-12-05 MED ORDER — LACTATED RINGERS IV SOLN: INTRAVENOUS | Status: DC

## 2021-12-05 MED ORDER — SODIUM CHLORIDE 0.9 % IV SOLN: INTRAVENOUS | Status: DC

## 2021-12-05 MED ORDER — PROPOFOL 10 MG/ML IV BOLUS
INTRAVENOUS | Status: DC | PRN
  Administered 2021-12-05: 70 mg via INTRAVENOUS

## 2021-12-05 MED ORDER — DILTIAZEM HCL ER COATED BEADS 120 MG PO CP24
120.0000 mg | ORAL_CAPSULE | Freq: Every day | ORAL | Status: DC
  Administered 2021-12-05 – 2021-12-09 (×5): 120 mg via ORAL
  Filled 2021-12-05 (×5): qty 1

## 2021-12-05 MED ORDER — PROPOFOL 10 MG/ML IV BOLUS: INTRAVENOUS | Status: DC | PRN

## 2021-12-05 MED ORDER — LIDOCAINE 2% (20 MG/ML) 5 ML SYRINGE
INTRAMUSCULAR | Status: DC | PRN
  Administered 2021-12-05: 100 mg via INTRAVENOUS

## 2021-12-05 MED ORDER — PROPOFOL 500 MG/50ML IV EMUL
INTRAVENOUS | Status: DC | PRN
  Administered 2021-12-05: 120 ug/kg/min via INTRAVENOUS

## 2021-12-05 SURGICAL SUPPLY — 15 items

## 2021-12-05 NOTE — Assessment & Plan Note (Signed)
Recheck in the morning. ?

## 2021-12-05 NOTE — Progress Notes (Deleted)
Primary Physician/Referring:  [name], DO  Patient ID: Manuel [name], male    DOB: 02/26/1946, 76 y.o.   MRN: [id number]  No chief complaint on file.  HPI:    Manuel [name]  is a 76 y.o. AAM patient with permanent atrial fibrillation, hypertension, diabetes mellitus with peripheral arterial disease, ongoing tobacco use disorder with underlying COPD, obstructive sleep apnea, severe presently on CPAP and compliant with CPAP, He had colonic stricture and pneumatosis leading to obstruction on 12/13/2017 and had colostomy which was reversed.   Echocardiogram 06/2021 revealed RV findings consistent with chronic cor pulmonale likely secondary to patient's smoking and underlying COPD.  Patient subsequently underwent right heart catheterization which revealed findings consistent with who group 3 mild pulmonary hypertension.    Patient was last seen in our office 09/05/2021 at which time patient was tolerating anticoagulation without bleeding diathesis, remained well rate controlled, and no changes were made. Since then patient was hospitalized with GI bleed. ***  ***  He now presents for follow-up.  Patient reports overall he is feeling well, states he feels his energy level has significantly improved.  He has had no recurrence of dizziness or lightheadedness.  Has had no recurrence of leg edema.  Overall patient is without specific complaints.  She reports he is continue to refrain from smoking since November 2022.  Past Medical History:  Diagnosis Date   AAA (abdominal aortic aneurysm) ([place])    per cardiologist note dated 03-14-2018 last duplex , 30-32m  (dr gEinar Gip   Acute GI bleeding 12/19/2017   Allergic rhinitis, seasonal    Anemia secondary to renal failure    Anticoagulant long-term use    ELIQUIS   Arthritis    LOWER BACK   B12 deficiency    BPH (benign prostatic hyperplasia)    Cirrhosis of liver ([place] 10/04/2021   CKD (chronic kidney disease), stage III ([place]    Colon  cancer ([place] 05/29/2018   No confirmed pathology indicating colon cancer.  Reports indicate benign findings on colonoscopy.  Had pneumatosis intestinalis followed by [facility] with colostomy and reversal.  KDamaris HippoFNP-C   Colonic stricture ([place] 12/13/2017   Colostomy present ([place]    Congenital absence of left kidney [facility] dr detarding   per CT 12-11-2017 and 01-02-2018   COPD with hypoxia ([place] 10/04/2021   Duodenal ulcer 12/20/2017   Dyslipidemia    Esophageal candidiasis ([place] 12/19/2017   Gastric ulcer without hemorrhage or perforation 12/20/2017   History of bowel resection 12/13/2017   for sigmoid stricture-- emergency sigmoidectomy   Hypercholesteremia 09/19/2018   Hyperparathyroidism, secondary renal ([place]    Hypertension    Left carotid bruit    OSA on CPAP    followed by [facility]   Permanent atrial fibrillation ([facility] 2017   cardiologist-  dr gEinar Gip  Solitary right kidney    Type 2 diabetes mellitus ([place]    followed by pcp   Wears glasses    Wears partial dentures    UPPER AND LOWER   Past Surgical History:  Procedure Laterality Date   BIOPSY  12/19/2017   Procedure: BIOPSY;  Surgeon: HCarol Ada MD;  [facility];  Service: Endoscopy;;   CARDIOVASCULAR STRESS TEST  11/16/2011   low risk nuclear study w/ no ischemia/  normal LV function and wall motion , ef 60%   CARDIOVERSION  12/18/2011   Procedure: CARDIOVERSION;  Surgeon: JLaverda Page MD;  [place] [facility]  Service: Cardiovascular;  Laterality: N/A;   CATARACT EXTRACTION W/ INTRAOCULAR LENS IMPLANT Right 2015   COLON RESECTION N/A 12/13/2017   Procedure: LAPAROSCOPIC END COLOSTOMY HARTMAN'S PROCEDURE;  Surgeon: Leighton Ruff, MD;  Location: [facility];  Service: General;  Laterality: N/A;   COLOSTOMY TAKEDOWN N/A 05/29/2018   Procedure: LAPAROSCOPIC COLOSTOMY REVERSAL ERAS PATHWAY, RIGID PROCTOSCOPY;  Surgeon: Leighton Ruff, MD;  Location: [facility];   Service: General;  Laterality: N/A;   CYST REMOVED FROM UPPER BACK  09/2017   BENIGN   ESOPHAGOGASTRODUODENOSCOPY N/A 12/19/2017   Procedure: ESOPHAGOGASTRODUODENOSCOPY (EGD);  Surgeon: Carol Ada, MD;  [facility];  Service: Endoscopy;  Laterality: N/A;   FLEXIBLE SIGMOIDOSCOPY N/A 12/13/2017   Procedure: FLEXIBLE SIGMOIDOSCOPY;  Surgeon: Carol Ada, MD;  [facility];  Service: Endoscopy;  Laterality: N/A;   INGUINAL HERNIA REPAIR Left 1980s   AND REMOVAL CYST ON UPPER BACK   PAROTIDECTOMY Right 09-21-1999   dr Janace Hoard _0    salivery gland mass  (benign warthin's tumor)   RIGHT HEART CATH N/A 08/22/2021   Procedure: RIGHT HEART CATH;  Surgeon: Adrian Prows, MD;  [facility];  Service: Cardiovascular;  Laterality: N/A;   TRANSTHORACIC ECHOCARDIOGRAM  01/04/2018   mild LVH, ef 87-56%, grade 2 diastolic function/  trivial AR and MR/  mild LAE/  mild to moderate TR   Family History  Problem Relation Age of Onset   Cancer Mother    Cancer Father    Alzheimer's disease Father    Diabetes Brother    Kidney disease Paternal Aunt    Sleep apnea Neg Hx     Social History   Tobacco Use   Smoking status: Former    Packs/day: 0.50    Years: 30.00    Pack years: 15.00    Types: Cigarettes    Quit date: 2022    Years since quitting: 1.3   Smokeless tobacco: Never   Tobacco comments:    Patient stated the he quit smoking again right after last OV.  Substance Use Topics   Alcohol use: No    Alcohol/week: 0.0 standard drinks   ROS  Review of Systems  Constitutional: Negative for malaise/fatigue and weight gain.  Cardiovascular:  Negative for chest pain, claudication, dyspnea on exertion, leg swelling, near-syncope, orthopnea, palpitations, paroxysmal nocturnal dyspnea (improved) and syncope.  Respiratory:  Positive for snoring (OSA on CPAP). Negative for shortness of breath.   Gastrointestinal:  Negative for melena.  Neurological:  Negative for  dizziness.  Objective  There were no vitals taken for this visit.          12/05/2021    3:25 PM 12/05/2021    3:11 PM 12/05/2021    3:06 PM  Vitals with BMI  Systolic 433 295 99  Diastolic 65 63 52  Pulse 77 87 83     Physical Exam Vitals reviewed.  Neck:     Vascular: No carotid bruit or JVD.  Cardiovascular:     Rate and Rhythm: Rhythm irregularly irregular.     Pulses:          Dorsalis pedis pulses are 0 on the right side and 0 on the left side.       Posterior tibial pulses are 0 on the right side and 0 on the left side.     Heart sounds: Murmur heard.  High-pitched blowing midsystolic murmur is present with a grade of 3/6 at the lower right sternal border and apex.    No  gallop.     Comments: AC access site well-healed without ecchymosis, hematoma, bruit. Pulmonary:     Effort: Pulmonary effort is normal.     Breath sounds: No wheezing.  Musculoskeletal:     Right lower leg: Edema (trace) present.     Left lower leg: Edema (trace) present.    Laboratory examination:       Latest Ref Rng & Units 12/05/2021    5:04 AM 12/04/2021   11:18 AM 11/13/2021    1:02 AM  CMP  Glucose 70 - 99 mg/dL 93   125   180    BUN 8 - 23 mg/dL _0 Creatinine 0.61 - 1.24 mg/dL 1.09   1.26   1.40    Sodium 135 - 145 mmol/L 144   142   140    Potassium 3.5 - 5.1 mmol/L 4.1   4.4   4.8    Chloride 98 - 111 mmol/L 102   101   98    CO2 22 - 32 mmol/L 37   35   38    Calcium 8.9 - 10.3 mg/dL 8.4   9.0   8.5    Total Protein 6.5 - 8.1 g/dL 5.0   6.0     Total Bilirubin 0.3 - 1.2 mg/dL 0.9   0.8     Alkaline Phos 38 - 126 U/L 49   57     AST 15 - 41 U/L 10   10     ALT 0 - 44 U/L 9   7         Latest Ref Rng & Units 12/05/2021    5:04 AM 12/04/2021   11:22 PM 12/04/2021    5:57 PM  CBC  WBC 4.0 - 10.5 K/uL 2.9      Hemoglobin 13.0 - 17.0 g/dL 8.8   8.7   9.9    Hematocrit 39.0 - 52.0 % 29.6   29.4   32.4    Platelets 150 - 400 K/uL 155       Lipid Panel  No results  found for: CHOL, TRIG, HDL, CHOLHDL, VLDL, LDLCALC, LDLDIRECT HEMOGLOBIN A1C Lab Results  Component Value Date   HGBA1C 6.6 (H) 09/29/2021   MPG 142.72 09/29/2021   TSH No results for input(s): TSH in the last 8760 hours.  External labs:  06/13/2021: HCT 46.3, Hgb 14.2, MCV 93.2, platelet 115 BUN 19, creatinine 1.4, potassium 4.0, sodium 146, GFR 56 A1c 7.0% Total cholesterol 115, HDL 50, LDL 53, triglycerides 62 TSH 1.96  05/02/2021: TSH normal at 1.53.  Vitamin D markedly reduced at 7.4.  B12 510.  A1c 6.7%. Serum glucose 100 mg, BUN 13, creatinine 1.37, EGFR 58 mL, potassium 4.1. Hb 13.4/HCT 44.0, platelets 162. Urine analysis normal. Total cholesterol 123, triglycerides 61, HDL 46, LDL 65.  05/20/2020:  Hb 15.3/HCT 45.5, platelets 149. Sodium 147, potassium 4.8, serum glucose 113, BUN 17, creatinine 1.4, EGFR 63 mL, CMP otherwise normal. Total cholesterol 115, triglycerides 54, HDL 48, LDL 56. TSH normal.  07/08/2019:  Hb 15.3/HCT 48.6, platelets 174. Serum glucose 89 mg, BUN 14, creatinine 1.2, EGFR >60 mL, CMP normal. A1c 6.5%. Total cholesterol 115, triglycerides 48, HDL 47, LDL 58. Uric acid 6.5.   Allergies   Allergies  Allergen Reactions   Sulfa Antibiotics Shortness Of Breath    Headaches    Colchicine     Other reaction(s): diarrhea   Labetalol Hcl  Other reaction(s): dysuria   Tramadol Hcl     Other reaction(s): nausea    Medications Prior to Visit:   Facility-Administered Medications Prior to Visit  Medication Dose Route Frequency Provider Last Rate Last Admin   albuterol (PROVENTIL) (2.5 MG/3ML) 0.083% nebulizer solution 2.5 mg  2.5 mg Nebulization Q4H PRN Carol Ada, MD       diltiazem (CARDIZEM CD) 24 hr capsule 120 mg  120 mg Oral Daily Carol Ada, MD   120 mg at 12/05/21 0950   doxazosin (CARDURA) tablet 4 mg  4 mg Oral QHS Carol Ada, MD       escitalopram (LEXAPRO) tablet 5 mg  5 mg Oral Daily Carol Ada, MD   5 mg at  12/05/21 0949   feeding supplement (ENSURE ENLIVE / ENSURE PLUS) liquid 237 mL  237 mL Oral BID BM Carol Ada, MD       insulin aspart (novoLOG) injection 0-9 Units  0-9 Units Subcutaneous TID WC Carol Ada, MD       [START ON 12/06/2021] multivitamin with minerals tablet 1 tablet  1 tablet Oral Daily Carol Ada, MD       ondansetron [facility]) tablet 4 mg  4 mg Oral Q6H PRN Carol Ada, MD       Or   ondansetron [facility]) injection 4 mg  4 mg Intravenous Q6H PRN Carol Ada, MD       [START ON 12/08/2021] pantoprazole (PROTONIX) injection 40 mg  40 mg Intravenous Q12H Carol Ada, MD       pantoprozole (PROTONIX) 80 mg /NS 100 mL infusion  8 mg/hr Intravenous Continuous Carol Ada, MD 10 mL/hr at 12/05/21 1230 8 mg/hr at 12/05/21 1230   polyethylene glycol-electrolytes (NuLYTELY) solution 4,000 mL  4,000 mL Oral Once Carol Ada, MD       Outpatient Medications Prior to Visit  Medication Sig Dispense Refill   acetaminophen (TYLENOL) 500 MG tablet Take 500 mg by mouth every 6 (six) hours as needed for moderate pain.     albuterol (PROVENTIL) (2.5 MG/3ML) 0.083% nebulizer solution Take 3 mLs (2.5 mg total) by nebulization every 4 (four) hours as needed for wheezing or shortness of breath. 75 mL 2   albuterol (VENTOLIN HFA) 108 (90 Base) MCG/ACT inhaler Inhale 2 puffs into the lungs every 4 (four) hours as needed for wheezing or shortness of breath. 1 each 2   apixaban (ELIQUIS) 5 MG TABS tablet Take 1 tablet (5 mg total) by mouth 2 (two) times daily. 180 tablet 3   D3-50 1.25 MG (50000 UT) capsule Take 50,000 Units by mouth every Wednesday.     diltiazem (CARDIZEM CD) 240 MG 24 hr capsule Take 1 capsule (240 mg total) by mouth daily. 90 capsule 3   doxazosin (CARDURA) 4 MG tablet Take 1 tablet (4 mg total) by mouth at bedtime. 90 tablet 3   escitalopram (LEXAPRO) 5 MG tablet Take 5 mg by mouth daily.     FARXIGA 10 MG TABS tablet Take 1 tablet (10 mg total) by mouth daily. 90  tablet 3   ferrous sulfate 325 (65 FE) MG tablet Take 1 tablet (325 mg total) by mouth daily with breakfast. 30 tablet 3   furosemide (LASIX) 40 MG tablet Take 0.5 tablets (20 mg total) by mouth in the morning. (Patient taking differently: Take 40 mg by mouth in the morning.) 90 tablet 1   metFORMIN (GLUCOPHAGE) 500 MG tablet Take 1 tablet (500 mg total) by mouth 2 (two) times daily  with a meal. 180 tablet 3   pantoprazole (PROTONIX) 40 MG tablet Take 1 tablet (40 mg total) by mouth daily. 30 tablet 0   potassium chloride (KLOR-CON) 10 MEQ tablet Take 2 tablets (20 mEq total) by mouth daily. Take while Taking Laisx/Furosemide 60 tablet 2   predniSONE (DELTASONE) 10 MG tablet Take 6 tablets by mouth on day 1. Take 5 tablets by mouth on day 2. Take 4 tablets by mouth on day 3. Take 3 tablets by mouth on day 4. Take 2 tablets on day 5, and take 1 tablet on day 6.  Take with food. (Patient not taking: Reported on 12/04/2021) 21 tablet 0   vitamin B-12 (CYANOCOBALAMIN) 1000 MCG tablet Take 1,000 mcg by mouth 2 (two) times daily.     Final Medications at End of Visit    No outpatient medications have been marked as taking for the 12/11/21 encounter (Appointment) with Rayetta Pigg, Merissa Renwick C, PA-C.   Radiology:   No AAA by CTA abdomen in 2019.  CXR 12/02/2017: Chronic bibasilar densities, likely scarring.  Mild cardiomegaly.  Cardiac Studies:  Right heart catheterization 08/22/2021: RA pressure 8/9 mean 8 mm mercury. RA saturation 66%.  RV pressure 48/0 and Right ventricular EDP 7 mm Hg. PA pressure 48/15 with a mean of 30 mm mercury. PA saturation 65%.  Pulmonary capillary wedge 13/13 with a mean of 12 mm Hg. Aortic saturation 98%.  Cardiac output was 4.26 with cardiac index of 1.96 by Fick.  PVR 4.23 Wood units.  QP: QS = 0.97, no significant intracardiac shunting  Impression: Findings consistent with mild pulmonary hypertension, WHO group 3 with low pulmonary capillary wedge pressure.  PCV  ECHOCARDIOGRAM COMPLETE 22/63/3354 Normal LV systolic function with visual EF 60-65%. Left ventricle cavity is normal in size. Moderate left ventricular hypertrophy. Normal global wall motion. Unable to evaluate diastolic function due to atrial fibrillation. Elevated LAP. Left atrial cavity is mildly dilated. Right atrial cavity is visually moderate to severely dilated. IAS bows from right to left, suggestive of increased RAP. Right ventricle cavity is visually moderate to severely dilated. Grossly normal right ventricular function. Mild (Grade I) aortic regurgitation. Severe tricuspid regurgitation, eccentric jet towards the septum. Mild pulmonary hypertension, under-appreciated due to dilated RA size and possible equalization of pressures. RVSP measures 35 mmHg. Insignificant pericardial effusion. There is no hemodynamic significance. IVC is dilated with a respiratory response of >50%. Compared to study 01/04/2018 LVEF remains preserved but right heart findings are new (increased RA and RV size, severe TR, increased RAP, dilated IVC).   PCV MYOCARDIAL PERFUSION WITH LEXISCAN 07/05/2021 Non-diagnostic ECG stress due to pharmacologic stress. Underlying A. Fibrillation. Myocardial perfusion is normal. Overall LV systolic function is normal without regional wall motion abnormalities. Stress LV EF: 59%. No significant change from 11/16/2011 report. A. Fib is new.  Low risk study.  EKG   06/06/2021: Atrial fibrillation with controlled ventricular response at rate of 76 bpm, rightward axis, anterolateral infarct old.  Single PVC.  Low-voltage complexes.  Pulmonary disease pattern.  Compared to 09/21/2020, previously leftward axis.  Assessment   No diagnosis found.  CHA2DS2-VASc Score is 4.  Yearly risk of stroke: 4.8% (A, HTN, DM).  Score of 1=0.6; 2=2.2; 3=3.2; 4=4.8; 5=7.2; 6=9.8; 7=>9.8) -(CHF; HTN; vasc disease DM,  Male = 1; Age <65 =0; 65-74 = 1,  >75 =2; stroke/embolism= 2).   No orders  of the defined types were placed in this encounter.    There are no discontinued medications.  Recommendations:  Manuel [name]  is a 76 y.o. AAM patient with permanent atrial fibrillation, hypertension, diabetes mellitus with peripheral arterial disease, ongoing tobacco use disorder with underlying COPD, obstructive sleep apnea, severe presently on CPAP and compliant with CPAP.   Echocardiogram 06/2021 revealed RV findings consistent with chronic cor pulmonale likely secondary to patient's smoking and underlying COPD.  Patient subsequently underwent right heart catheterization which revealed findings consistent with who group 3 mild pulmonary hypertension.  He now presents for follow-up.  Reviewed and discussed results of right heart catheterization, details above.  Patient is scheduled for follow-up visit with Dr. Silas Flood of pulmonology, will defer management of underlying COPD accordingly.  Patient's blood pressure is well controlled and he is relatively asymptomatic without clinical evidence of heart failure at this time.  Continue diltiazem, doxazosin, Eliquis, Farxiga, lisinopril, pravastatin, as well as Lasix.  In regard to atrial fibrillation, patient continues to tolerate anticoagulation without bleeding diathesis and remains well rate controlled.  Follow-up in 3 months, sooner if needed.   Alethia Berthold, PA-C 12/05/2021, 5:28 PM Office: [phone]

## 2021-12-05 NOTE — Op Note (Signed)
[facility] ?Patient Name: Manuel [name] ?Procedure Date: 12/05/2021 ?MRN: [id number] ?Attending MD: Carol Ada , MD ?Date of Birth: Jul 15, 1946 ?CSN: [id number] ?Age: 76 ?Admit Type: Inpatient ?Procedure:                Upper GI endoscopy ?Indications:              Hematochezia, Melena ?Providers:                Carol Ada, MD, Jeanella Cara, RN,  ?                          Benetta Spar, Technician ?Referring MD:              ?Medicines:                Propofol per Anesthesia ?Complications:            No immediate complications. ?Estimated Blood Loss:     Estimated blood loss: none. ?Procedure:                Pre-Anesthesia Assessment: ?                          - Prior to the procedure, a History and Physical  ?                          was performed, and patient medications and  ?                          allergies were reviewed. The patient's tolerance of  ?                          previous anesthesia was also reviewed. The risks  ?                          and benefits of the procedure and the sedation  ?                          options and risks were discussed with the patient.  ?                          All questions were answered, and informed consent  ?                          was obtained. Prior Anticoagulants: The patient has  ?                          taken no previous anticoagulant or antiplatelet  ?                          agents. ASA Grade Assessment: III - A patient with  ?                          severe systemic disease. After reviewing the risks  ?                          and  benefits, the patient was deemed in  ?                          satisfactory condition to undergo the procedure. ?                          - Sedation was administered by an anesthesia  ?                          professional. Deep sedation was attained. ?                          After obtaining informed consent, the endoscope was  ?                          passed under direct vision.  Throughout the  ?                          procedure, the patient's blood pressure, pulse, and  ?                          oxygen saturations were monitored continuously. The  ?                          [id number]-[id number] ([id number]) Olympus endoscope was introduced  ?                          through the mouth, and advanced to the second part  ?                          of duodenum. The upper GI endoscopy was  ?                          accomplished without difficulty. The patient  ?                          tolerated the procedure well. ?Scope In: ?Scope Out: ?Findings: ?     The esophagus was normal. ?     Diffuse moderate inflammation characterized by congestion (edema) and  ?     erythema was found in the gastric fundus and in the gastric body. ?     The examined duodenum was normal. ?     Biopsies of the stomach were not obtained as the prior biopsies of the  ?     gastritis were negative for any etiology. The antrum appeared distorted,  ?     but it was benign in appearance. ?Impression:               - Normal esophagus. ?                          - Gastritis. ?                          - Normal examined duodenum. ?                          -  No specimens collected. ?Moderate Sedation: ?     Not Applicable - Patient had care per Anesthesia. ?Recommendation:           - Return patient to hospital ward for ongoing care. ?                          - Clear liquid diet. ?                          - Continue present medications. ?                          - Colonoscopy tomorrow. ?Procedure Code(s):        --- Professional --- ?                          [phone], Esophagogastroduodenoscopy, flexible,  ?                          transoral; diagnostic, including collection of  ?                          specimen(s) by brushing or washing, when performed  ?                          (separate procedure) ?Diagnosis Code(s):        --- Professional --- ?                          K29.70, Gastritis, unspecified, without bleeding ?                           K92.1, Melena (includes Hematochezia) ?CPT copyright 2019 [employer]. All rights reserved. ?The codes documented in this report are preliminary and upon coder review may  ?be revised to meet current compliance requirements. ?Carol Ada, MD ?Carol Ada, MD ?12/05/2021 2:51:07 PM ?This report has been signed electronically. ?Number of Addenda: 0 ?

## 2021-12-05 NOTE — Assessment & Plan Note (Signed)
Continue home Cardura. ?Resume Cardizem at reduced dose ?Continue holding Lasix ?

## 2021-12-05 NOTE — Progress Notes (Signed)
Initial Nutrition Assessment ? ?DOCUMENTATION CODES:  ? ?Severe malnutrition in context of chronic illness ? ?INTERVENTION:  ? ?-Ensure Plus High Protein po BID, each supplement provides 350 kcal and 20 grams of protein.  ? ?-Multivitamin with minerals daily ? ?-D/c Boost Breeze ? ?-Checking labs: Zinc  given reports of taste changes and history of liver cirrhosis ? ?NUTRITION DIAGNOSIS:  ? ?Severe Malnutrition related to chronic illness (liver cirrhosis, CHF, COPD) as evidenced by moderate fat depletion, severe muscle depletion, energy intake < or equal to 75% for > or equal to 1 month, percent weight loss. ? ?GOAL:  ? ?Patient will meet greater than or equal to 90% of their needs ? ?MONITOR:  ? ?PO intake, Diet advancement, Supplement acceptance, Labs, Weight trends, I & O's ? ?REASON FOR ASSESSMENT:  ? ?Malnutrition Screening Tool ?  ? ?ASSESSMENT:  ? ?76 y.o. male with medical history significant of OSA on CPAP, HLD, HTN, chronic hypoxia respiratory failure, COPD, cirrhosis of liver, BPH, HTN, a fib on eliquis. Presenting with dark stools. Admitted for GI-bleed. ? ?Patient in room with wife at bedside. Pt reports he has had poor appetite for about 6 months now. Has eaten very little as food is not tasting right and he lacks the desire to eat. Pt really likes fruits mostly.  ?Pt currently NPO awaiting EGD today to assess GI bleed.  ? ?Pt agreeable to drinking Ensure once diet is advanced. Agreeable to daily MVI as well. ? ?Per pt's wife, pt takes D3-50 supplement (Vitamin D 50000 IU). Per home medications, pt also takes Vitamin B-12, potassium and iron.  ? ?Pt reports UBW of 230 lbs. ?Per weight records, pt has lost 25 lbs since 08/17/21 (12% wt loss x 3.5 months, significant for time frame).  ? ?Medications: Lactated ringers ? ?Labs reviewed: ?CBGs: 79-201  ? ?NUTRITION - FOCUSED PHYSICAL EXAM: ? ?Flowsheet Row Most Recent Value  ?Orbital Region No depletion  ?Upper Arm Region Moderate depletion  ?Thoracic and  Lumbar Region No depletion  ?Buccal Region Moderate depletion  ?Temple Region Moderate depletion  ?Clavicle Bone Region Severe depletion  ?Clavicle and Acromion Bone Region Severe depletion  ?Scapular Bone Region Severe depletion  ?Dorsal Hand No depletion  ?Patellar Region Moderate depletion  ?Anterior Thigh Region Moderate depletion  ?Posterior Calf Region Moderate depletion  ?Edema (RD Assessment) None  ?Hair Reviewed  ?Eyes Reviewed  ?Mouth Reviewed  ?Skin Reviewed  [dry]  ?Nails Reviewed  ? ?  ? ? ?Diet Order:   ?Diet Order   ? ?       ?  Diet NPO time specified  Diet effective now       ?  ? ?  ?  ? ?  ? ? ?EDUCATION NEEDS:  ? ?No education needs have been identified at this time ? ?Skin:  Skin Assessment: Reviewed RN Assessment ? ?Last BM:  5/15 ? ?Height:  ? ?Ht Readings from Last 1 Encounters:  ?12/05/21 _0  (1.905 m)  ? ? ?Weight:  ? ?Wt Readings from Last 1 Encounters:  ?12/05/21 81.3 kg  ? ? ?BMI:  Body mass index is 22.4 kg/m?. ? ?Estimated Nutritional Needs:  ? ?Kcal:  2000-2200 ? ?Protein:  110-125g ? ?Fluid:  2L/day ? ?Clayton Bibles, MS, RD, LDN ?Inpatient Clinical Dietitian ?Contact information available via Amion ? ?

## 2021-12-05 NOTE — Transfer of Care (Signed)
Immediate Anesthesia Transfer of Care Note ? ?Patient: DONAT HUMBLE ? ?Procedure(s) Performed: ESOPHAGOGASTRODUODENOSCOPY (EGD) WITH PROPOFOL ? ?Patient Location: PACU ? ?Anesthesia Type:MAC ? ?Level of Consciousness: awake, alert , drowsy and patient cooperative ? ?Airway & Oxygen Therapy: Patient Spontanous Breathing and Patient connected to nasal cannula oxygen ? ?Post-op Assessment: Report given to RN, Post -op Vital signs reviewed and stable and Patient moving all extremities X 4 ? ?Post vital signs: Reviewed and stable ? ?Last Vitals:  ?Vitals Value Taken Time  ?BP 100/57 12/05/21 1446  ?Temp 36.7 ?C 12/05/21 1446  ?Pulse 99 12/05/21 1446  ?Resp 19 12/05/21 1446  ?SpO2 96 % 12/05/21 1446  ? ? ?Last Pain:  ?Vitals:  ? 12/05/21 1446  ?TempSrc: Temporal  ?PainSc: Asleep  ?   ? ?  ? ?Complications: No notable events documented. ?

## 2021-12-05 NOTE — Hospital Course (Signed)
76 year old M with PMH of COPD/RF on 3 L, OSA on CPAP, liver cirrhosis, A-fib on Eliquis, HTN, gastric ulcer and BPH presenting with melena and admitted for ABLA due to possible upper GI bleed.  Hgb 10 (13 about a month ago).  Hemoccult positive.  Started on IV Protonix.  GI consulted. ? ?EGD on 5/16 showed gastritis but no other significant finding.  Plan for colonoscopy on 5/17 ?

## 2021-12-05 NOTE — Assessment & Plan Note (Signed)
Stable on home 3 L. ?

## 2021-12-05 NOTE — Assessment & Plan Note (Signed)
Appears euvolemic on exam. ?Hold home Lasix ?

## 2021-12-05 NOTE — Consult Note (Signed)
[facility] CM Inpatient Consult ? ? ?12/05/2021 ? ?[name] ?13-Dec-1945 ?[id number] ? ?[place] [facility] [place] CM) ?  ?Patient was reviewed for less than 30 days readmission. ? ?Per review, patient primary provider office offers chronic care coordination services and team. No [facility] CM follow up. ? ?Of note, [facility] Care Management services does not replace or interfere with any services that are arranged by inpatient case management or social work.  ? ?Netta Cedars, MSN, RN ?[facility] Liaison ?Toll free office [phone]  ?

## 2021-12-05 NOTE — TOC Initial Note (Signed)
Transition of Care (TOC) - Initial/Assessment Note  ? ? ?Patient Details  ?Name: Manuel [name] ?MRN: [id number] ?Date of Birth: 04-29-1946 ? ?Transition of Care [facility]) CM/SW Contact:    ?Leeroy Cha, RN ?Phone Number: ?12/05/2021, 9:09 AM ? ?Clinical Narrative:                 ? ?Transition of Care (TOC) Screening Note ? ? ?Patient Details  ?Name: Manuel [name] ?Date of Birth: 03/20/1946 ? ? ?Transition of Care [facility]) CM/SW Contact:    ?Leeroy Cha, RN ?Phone Number: ?12/05/2021, 9:09 AM ? ? ? ?Transition of Care Department [facility]) has reviewed patient and no TOC needs have been identified at this time. We will continue to monitor patient advancement through interdisciplinary progression rounds. If new patient transition needs arise, please place a TOC consult. ? ? ? ?Expected Discharge Plan: Home/Self Care ?Barriers to Discharge: No Barriers Identified ? ? ?Patient Goals and CMS Choice ?Patient states their goals for this hospitalization and ongoing recovery are:: to go homne ?CMS Medicare.gov Compare Post Acute Care list provided to:: Patient ?Choice offered to / list presented to : Patient ? ?Expected Discharge Plan and Services ?Expected Discharge Plan: Home/Self Care ?  ?Discharge Planning Services: CM Consult ?  ?Living arrangements for the past 2 months: [place] ?                ?  ?  ?  ?  ?  ?  ?  ?  ?  ?  ? ?Prior Living Arrangements/Services ?Living arrangements for the past 2 months: [place] ?Lives with:: Spouse ?Patient language and need for interpreter reviewed:: Yes ?Do you feel safe going back to the place where you live?: Yes      ?  ?  ?  ?Criminal Activity/Legal Involvement Pertinent to Current Situation/Hospitalization: No - Comment as needed ? ?Activities of Daily Living ?Home Assistive Devices/Equipment: Oxygen ?ADL Screening (condition at time of admission) ?Patient's cognitive ability adequate to safely complete daily activities?: Yes ?Is the patient deaf or  have difficulty hearing?: No ?Does the patient have difficulty seeing, even when wearing glasses/contacts?: No ?Does the patient have difficulty concentrating, remembering, or making decisions?: No ?Patient able to express need for assistance with ADLs?: Yes ?Does the patient have difficulty dressing or bathing?: No ?Independently performs ADLs?: Yes (appropriate for developmental age) ?Does the patient have difficulty walking or climbing stairs?: Yes ?Weakness of Legs: Left ?Weakness of Arms/Hands: None ? ?Permission Sought/Granted ?  ?  ?   ?   ?   ?   ? ?Emotional Assessment ?Appearance:: Appears stated age ?  ?  ?Orientation: : Oriented to Self, Oriented to  Time, Oriented to Place, Oriented to Situation ?Alcohol / Substance Use: Not Applicable ?Psych Involvement: No (comment) ? ?Admission diagnosis:  GIB (gastrointestinal bleeding) [K92.2] ?Gastrointestinal hemorrhage, unspecified gastrointestinal hemorrhage type [K92.2] ?Patient Active Problem List  ? Diagnosis Date Noted  ? GIB (gastrointestinal bleeding) 12/04/2021  ? Chronic respiratory failure with hypoxia ([place]) 12/04/2021  ? Chronic heart failure with preserved ejection fraction (HFpEF) ([place]) 12/04/2021  ? CKD (chronic kidney disease) stage 2, GFR 60-89 ml/min 12/04/2021  ? CAP (community acquired pneumonia) 11/11/2021  ? Pleural effusion on right 11/11/2021  ? Depression, major, single episode, moderate ([place]) 11/10/2021  ? COPD exacerbation ([place]) 11/10/2021  ? Palliative care encounter 10/04/2021  ? COPD (chronic obstructive pulmonary disease) ([place]) 10/04/2021  ? Cirrhosis of liver ([place]) 10/04/2021  ? Acute  encephalopathy 09/28/2021  ? AKI (acute kidney injury) ([place]) 09/28/2021  ? Acute on chronic respiratory failure with hypoxia and hypercapnia ([place]) 09/28/2021  ? Aspiration pneumonia ([place]) 09/28/2021  ? Dyspnea on exertion 08/22/2021  ? Cor pulmonale, chronic ([place]) 08/18/2021  ? Hypercholesteremia 09/19/2018  ? Vitamin B12 deficiency 12/20/2017  ?  Hypoxia   ? Atrial fibrillation ([place]) 12/12/2017  ? BPH (benign prostatic hyperplasia) 12/12/2017  ? DM (diabetes mellitus) ([place]) 12/12/2017  ? Primary hypertension 12/12/2017  ? OSA (obstructive sleep apnea) 12/12/2017  ? Constipation   ? ?PCP:  [name], DO ?Pharmacy:   ?[id number] [place], [place] - [address] ?[address]?Phone: [phone]: [phone]? ?[facility] ?[address]?Phone: [phone]: [phone]? ?[address]?Phone: [phone]: [phone]? ? ? ? ?Social Determinants of Health (SDOH) Interventions ?  ? ?Readmission Risk Interventions ?   ? View : No data to display.  ?  ?  ?  ? ? ? ?

## 2021-12-05 NOTE — Assessment & Plan Note (Signed)
Stable. ?Change albuterol to Xopenex as needed ?

## 2021-12-05 NOTE — Progress Notes (Signed)
?PROGRESS NOTE ? ?Manuel [name] ZJQ:[id number] DOB: [birth year]  ? ?PCP: [name], DO ? ?Patient is from: Home.  Lives with his wife.  Independently ambulates at baseline. ? ?DOA: 12/04/2021 LOS: 1 ? ?Chief complaints ?Chief Complaint  ?Patient presents with  ? Melena  ?  ? ?Brief Narrative / Interim history: ?76 year old M with PMH of COPD/RF on 3 L, OSA on CPAP, liver cirrhosis, A-fib on Eliquis, HTN, gastric ulcer and BPH presenting with melena and admitted for ABLA due to possible upper GI bleed.  Hgb 10 (13 about a month ago).  Hemoccult positive.  Started on IV Protonix.  GI consulted. ? ?EGD on 5/16 showed gastritis but no other significant finding.  Plan for colonoscopy on 5/17  ? ?Subjective: ?Seen and examined earlier this morning before he went down for EGD.  No major events overnight of this morning.  No complaints.  He denies chest pain, dyspnea, abdominal pain, nausea or vomiting.  Has not had further bowel movements here.  He reports some dizziness when he gets up. ? ?Objective: ?Vitals:  ? 12/05/21 1454 12/05/21 1506 12/05/21 1511 12/05/21 1525  ?BP: 99/62 (!) 99/52 111/63 103/65  ?Pulse: 87 83 87 77  ?Resp: 19 19 (!) 22 (!) 22  ?Temp:      ?TempSrc:      ?SpO2: 99% 95% 95% 97%  ?Weight:      ?Height:      ? ? ?Examination: ? ?GENERAL: No apparent distress.  Nontoxic. ?HEENT: MMM.  Vision and hearing grossly intact.  ?NECK: Supple.  No apparent JVD.  ?RESP:  No IWOB.  Fair aeration bilaterally. ?CVS:  RRR. Heart sounds normal.  ?ABD/GI/GU: BS+. Abd soft, NTND.  ?MSK/EXT:  Moves extremities. No apparent deformity. No edema.  ?SKIN: no apparent skin lesion or wound ?NEURO: Awake, alert and oriented appropriately.  No apparent focal neuro deficit. ?PSYCH: Calm. Normal affect.  ? ?Procedures:  ?5/16-EGD showed gastritis. ? ?Microbiology summarized: ?None. ? ?Assessment and Plan: ?Acute blood loss anemia due to GI bleed ?Recent Labs  ?  09/30/21 ?0357 10/01/21 ?0432 11/10/21 ?1949 11/11/21 ?2409  11/12/21 ?0211 11/13/21 ?0102 12/04/21 ?1118 12/04/21 ?1757 12/04/21 ?2322 12/05/21 ?7353  ?HGB 15.0 14.8 13.4 12.7* 13.1 13.0 10.0* 9.9* 8.7* 8.8*  ?Patient presents with melena.  He is on Eliquis for A-fib.  Denies NSAID use.  Recently treated with a steroid for COPD exacerbation.  EGD with gastritis.  H&H seems to be stable after initial drop. ?-Continue holding Eliquis ?-Monitor H&H and transfuse for Hgb <7.0. ?-Continue IV Protonix ?-Clear liquid diet and n.p.o. after midnight per GI. ?-Plan for colonoscopy on 5/17 ? ? ?Chronic respiratory failure with hypoxia (HCC) ?Stable on home 3 L. ? ?COPD (chronic obstructive pulmonary disease) ([place]) ?Stable. ?Change albuterol to Xopenex as needed ? ?Atrial fibrillation ([place]) ?Rate controlled on reduced dose of home Cardizem ?Holding Eliquis in the setting of GI bleed. ? ?Protein-calorie malnutrition, severe ?Nutrition Status: ?Nutrition Problem: Severe Malnutrition ?Etiology: chronic illness (liver cirrhosis, CHF, COPD) ?Signs/Symptoms: moderate fat depletion, severe muscle depletion, energy intake < or equal to 75% for > or equal to 1 month, percent weight loss ?Interventions: Ensure Enlive (each supplement provides 350kcal and 20 grams of protein), MVI ? ?OSA (obstructive sleep apnea) ?Continue CPAP at night ? ?Controlled NIDDM-2 ?A1c 6.6% about 2 months ago.  On Farxiga and metformin at home. ?Recent Labs  ?Lab 12/04/21 ?1610 12/04/21 ?2124 12/05/21 ?0741 12/05/21 ?1126 12/05/21 ?1401  ?GLUCAP 77 201* 101* 106* 79  ?  Continue SSI-sensitive ?Hold home p.o. medications ? ? ?Leukopenia ?Recheck in the morning. ? ?Chronic heart failure with preserved ejection fraction (HFpEF) ([place]) ?Appears euvolemic on exam. ?Hold home Lasix ? ?Primary hypertension ?Continue home Cardura. ?Resume Cardizem at reduced dose ?Continue holding Lasix ? ?BPH (benign prostatic hyperplasia) ?Continue home Cardura ? ? ?DVT prophylaxis:  ?SCDs Start: 12/04/21 1639 ? ?Code Status: Full code ?Family  Communication: Patient and/or RN. Available if any question.  ?Level of care: Progressive.  Change level of care to telemetry. ?Status is: Inpatient ?Remains inpatient appropriate because: Acute blood loss anemia due to GI bleed ? ? ?Final disposition: Likely home once medically clear. ?Consultants:  ?Gastroenterology ? ?Sch Meds:  ?Scheduled Meds: ? diltiazem  120 mg Oral Daily  ? doxazosin  4 mg Oral QHS  ? escitalopram  5 mg Oral Daily  ? feeding supplement  237 mL Oral BID BM  ? insulin aspart  0-9 Units Subcutaneous TID WC  ? [START ON 12/06/2021] multivitamin with minerals  1 tablet Oral Daily  ? [START ON 12/08/2021] pantoprazole  40 mg Intravenous Q12H  ? polyethylene glycol-electrolytes  4,000 mL Oral Once  ? ?Continuous Infusions: ? pantoprazole 8 mg/hr (12/05/21 1230)  ? ?PRN Meds:.albuterol, ondansetron **OR** ondansetron (ZOFRAN) IV ? ?Antimicrobials: ?Anti-infectives (From admission, onward)  ? ? None  ? ?  ? ? ? ?I have personally reviewed the following labs and images: ?CBC: ?Recent Labs  ?Lab 12/04/21 ?1118 12/04/21 ?1757 12/04/21 ?2322 12/05/21 ?6761  ?WBC 3.8*  --   --  2.9*  ?NEUTROABS 3.3  --   --   --   ?HGB 10.0* 9.9* 8.7* 8.8*  ?HCT 33.0* 32.4* 29.4* 29.6*  ?MCV 98.8  --   --  103.5*  ?PLT 157  --   --  155  ? ?BMP &GFR ?Recent Labs  ?Lab 12/04/21 ?1118 12/05/21 ?9509  ?NA 142 144  ?K 4.4 4.1  ?CL 101 102  ?CO2 35* 37*  ?GLUCOSE 125* 93  ?BUN 28* 19  ?CREATININE 1.26* 1.09  ?CALCIUM 9.0 8.4*  ? ?Estimated Creatinine Clearance: 66.3 mL/min (by C-G formula based on SCr of 1.09 mg/dL). ?Liver & Pancreas: ?Recent Labs  ?Lab 12/04/21 ?1118 12/05/21 ?3267  ?AST 10* 10*  ?ALT 7 9  ?ALKPHOS 57 49  ?BILITOT 0.8 0.9  ?PROT 6.0* 5.0*  ?ALBUMIN 3.5 2.5*  ? ?No results for input(s): LIPASE, AMYLASE in the last 168 hours. ?No results for input(s): AMMONIA in the last 168 hours. ?Diabetic: ?No results for input(s): HGBA1C in the last 72 hours. ?Recent Labs  ?Lab 12/04/21 ?1610 12/04/21 ?2124 12/05/21 ?0741  12/05/21 ?1126 12/05/21 ?1401  ?GLUCAP 77 201* 101* 106* 79  ? ?Cardiac Enzymes: ?No results for input(s): CKTOTAL, CKMB, CKMBINDEX, TROPONINI in the last 168 hours. ?No results for input(s): PROBNP in the last 8760 hours. ?Coagulation Profile: ?No results for input(s): INR, PROTIME in the last 168 hours. ?Thyroid Function Tests: ?No results for input(s): TSH, T4TOTAL, FREET4, T3FREE, THYROIDAB in the last 72 hours. ?Lipid Profile: ?No results for input(s): CHOL, HDL, LDLCALC, TRIG, CHOLHDL, LDLDIRECT in the last 72 hours. ?Anemia Panel: ?No results for input(s): VITAMINB12, FOLATE, FERRITIN, TIBC, IRON, RETICCTPCT in the last 72 hours. ?Urine analysis: ?   ?Component Value Date/Time  ? [place] YELLOW 09/28/2021 1935  ? APPEARANCEUR CLEAR 09/28/2021 1935  ? LABSPEC 1.015 09/28/2021 1935  ? PHURINE 5.0 09/28/2021 1935  ? GLUCOSEU NEGATIVE 09/28/2021 1935  ? [place] NEGATIVE 09/28/2021 1935  ? BILIRUBINUR NEGATIVE 09/28/2021  1935  ? [place] Stain NEGATIVE 09/28/2021 1935  ? PROTEINUR 100 (A) 09/28/2021 1935  ? NITRITE NEGATIVE 09/28/2021 1935  ? LEUKOCYTESUR NEGATIVE 09/28/2021 1935  ? ?Sepsis Labs: ?Invalid input(s): PROCALCITONIN, LACTICIDVEN ? ?Microbiology: ?No results found for this or any previous visit (from the past 240 hour(s)). ? ?Radiology Studies: ?No results found. ? ? ? ?Manuel [name] ?[facility] ? ?If 7PM-7AM, please contact night-coverage ?[email or web address] ?12/05/2021, 4:58 PM   ?

## 2021-12-05 NOTE — Anesthesia Postprocedure Evaluation (Signed)
Anesthesia Post Note ? ?Patient: Manuel [name] ? ?Procedure(s) Performed: ESOPHAGOGASTRODUODENOSCOPY (EGD) WITH PROPOFOL ? ?  ? ?Patient location during evaluation: PACU ?Anesthesia Type: MAC ?Level of consciousness: awake and alert ?Pain management: pain level controlled ?Vital Signs Assessment: post-procedure vital signs reviewed and stable ?Respiratory status: spontaneous breathing, nonlabored ventilation, respiratory function stable and patient connected to nasal cannula oxygen ?Cardiovascular status: stable and blood pressure returned to baseline ?Postop Assessment: no apparent nausea or vomiting ?Anesthetic complications: no ? ? ?No notable events documented. ? ?Last Vitals:  ?Vitals:  ? 12/05/21 1446 12/05/21 1454  ?BP: (!) 100/57 99/62  ?Pulse: 99 87  ?Resp: 19 19  ?Temp: 36.7 ?C   ?SpO2: 96% 99%  ?  ?Last Pain:  ?Vitals:  ? 12/05/21 1454  ?TempSrc:   ?PainSc: 0-No pain  ? ? ?  ?  ?  ?  ?  ?  ? ?Effie Berkshire ? ? ? ? ?

## 2021-12-05 NOTE — Assessment & Plan Note (Signed)
Continue home Cardura ?

## 2021-12-05 NOTE — Assessment & Plan Note (Signed)
Nutrition Status: ?Nutrition Problem: Severe Malnutrition ?Etiology: chronic illness (liver cirrhosis, CHF, COPD) ?Signs/Symptoms: moderate fat depletion, severe muscle depletion, energy intake < or equal to 75% for > or equal to 1 month, percent weight loss ?Interventions: Ensure Enlive (each supplement provides 350kcal and 20 grams of protein), MVI ?

## 2021-12-05 NOTE — Assessment & Plan Note (Signed)
Continue CPAP at night ?

## 2021-12-05 NOTE — Assessment & Plan Note (Addendum)
A1c 6.6% about 2 months ago.  On Farxiga and metformin at home. ?Recent Labs  ?Lab 12/04/21 ?1610 12/04/21 ?2124 12/05/21 ?0741 12/05/21 ?1126 12/05/21 ?1401  ?GLUCAP 77 201* 101* 106* 79  ?Continue SSI-sensitive ?Hold home p.o. medications ? ?

## 2021-12-05 NOTE — Anesthesia Preprocedure Evaluation (Addendum)
Anesthesia Evaluation  ?Patient identified by MRN, date of birth, ID band ?Patient awake ? ? ? ?Reviewed: ?Allergy & Precautions, NPO status , Patient's Chart, lab work & pertinent test results ? ?Airway ?Mallampati: II ? ?TM Distance: >3 FB ?Neck ROM: Full ? ? ? Dental ? ?(+) Edentulous Upper, Dental Advisory Given ?  ?Pulmonary ?sleep apnea and Continuous Positive Airway Pressure Ventilation , COPD, former smoker,  ?  ?breath sounds clear to auscultation ? ? ? ? ? ? Cardiovascular ?hypertension,  ?Rhythm:Regular Rate:Normal ? ? ?  ?Neuro/Psych ?PSYCHIATRIC DISORDERS Depression   ? GI/Hepatic ?Neg liver ROS, PUD, GERD  Medicated,  ?Endo/Other  ?diabetes, Type 2, Oral Hypoglycemic Agents ? Renal/GU ?Renal disease  ? ?  ?Musculoskeletal ? ?(+) Arthritis ,  ? Abdominal ?Normal abdominal exam  (+)   ?Peds ? Hematology ?  ?Anesthesia Other Findings ? ? Reproductive/Obstetrics ? ?  ? ? ? ? ? ? ? ? ? ? ? ? ? ?  ?  ? ? ? ? ? ? ? ?Anesthesia Physical ?Anesthesia Plan ? ?ASA: 3 ? ?Anesthesia Plan: MAC  ? ?Post-op Pain Management:   ? ?Induction: Intravenous ? ?PONV Risk Score and Plan: 0 and Propofol infusion ? ?Airway Management Planned: Natural Airway and Simple Face Mask ? ?Additional Equipment: None ? ?Intra-op Plan:  ? ?Post-operative Plan:  ? ?Informed Consent: I have reviewed the patients History and Physical, chart, labs and discussed the procedure including the risks, benefits and alternatives for the proposed anesthesia with the patient or authorized representative who has indicated his/her understanding and acceptance.  ? ? ? ? ? ?Plan Discussed with: CRNA ? ?Anesthesia Plan Comments:   ? ? ? ? ? ?Anesthesia Quick Evaluation ? ?

## 2021-12-05 NOTE — Assessment & Plan Note (Signed)
Recent Labs  ?  09/30/21 ?0357 10/01/21 ?0432 11/10/21 ?1949 11/11/21 ?1655 11/12/21 ?0211 11/13/21 ?0102 12/04/21 ?1118 12/04/21 ?1757 12/04/21 ?2322 12/05/21 ?3748  ?HGB 15.0 14.8 13.4 12.7* 13.1 13.0 10.0* 9.9* 8.7* 8.8*  ?Patient presents with melena.  He is on Eliquis for A-fib.  Denies NSAID use.  Recently treated with a steroid for COPD exacerbation.  EGD with gastritis.  H&H seems to be stable after initial drop. ?-Continue holding Eliquis ?-Monitor H&H and transfuse for Hgb <7.0. ?-Continue IV Protonix ?-Clear liquid diet and n.p.o. after midnight per GI. ?-Plan for colonoscopy on 5/17 ? ?

## 2021-12-05 NOTE — Assessment & Plan Note (Signed)
Rate controlled on reduced dose of home Cardizem ?Holding Eliquis in the setting of GI bleed. ?

## 2021-12-06 ENCOUNTER — Encounter (HOSPITAL_COMMUNITY)
Admission: EM | Disposition: A | Discharge status: Home / Self Care | Payer: Self-pay | Source: Home / Self Care | Attending: Internal Medicine

## 2021-12-06 ENCOUNTER — Inpatient Hospital Stay (HOSPITAL_COMMUNITY): Payer: Medicare HMO | Admitting: Certified Registered Nurse Anesthetist

## 2021-12-06 ENCOUNTER — Encounter (HOSPITAL_COMMUNITY): Payer: Self-pay | Admitting: Internal Medicine

## 2021-12-06 DIAGNOSIS — K922 Gastrointestinal hemorrhage, unspecified: Secondary | ICD-10-CM | POA: Diagnosis not present

## 2021-12-06 DIAGNOSIS — I4821 Permanent atrial fibrillation: Secondary | ICD-10-CM | POA: Diagnosis not present

## 2021-12-06 DIAGNOSIS — J449 Chronic obstructive pulmonary disease, unspecified: Secondary | ICD-10-CM | POA: Diagnosis not present

## 2021-12-06 DIAGNOSIS — K5731 Diverticulosis of large intestine without perforation or abscess with bleeding: Secondary | ICD-10-CM | POA: Diagnosis not present

## 2021-12-06 DIAGNOSIS — I5032 Chronic diastolic (congestive) heart failure: Secondary | ICD-10-CM | POA: Diagnosis not present

## 2021-12-06 DIAGNOSIS — E119 Type 2 diabetes mellitus without complications: Secondary | ICD-10-CM | POA: Diagnosis not present

## 2021-12-06 DIAGNOSIS — D72819 Decreased white blood cell count, unspecified: Secondary | ICD-10-CM | POA: Diagnosis not present

## 2021-12-06 DIAGNOSIS — K635 Polyp of colon: Secondary | ICD-10-CM

## 2021-12-06 DIAGNOSIS — D62 Acute posthemorrhagic anemia: Secondary | ICD-10-CM | POA: Diagnosis not present

## 2021-12-06 DIAGNOSIS — I4891 Unspecified atrial fibrillation: Secondary | ICD-10-CM

## 2021-12-06 DIAGNOSIS — K5520 Angiodysplasia of colon without hemorrhage: Secondary | ICD-10-CM | POA: Diagnosis not present

## 2021-12-06 DIAGNOSIS — E43 Unspecified severe protein-calorie malnutrition: Secondary | ICD-10-CM | POA: Diagnosis not present

## 2021-12-06 DIAGNOSIS — I10 Essential (primary) hypertension: Secondary | ICD-10-CM | POA: Diagnosis not present

## 2021-12-06 DIAGNOSIS — J9611 Chronic respiratory failure with hypoxia: Secondary | ICD-10-CM | POA: Diagnosis not present

## 2021-12-06 DIAGNOSIS — G4733 Obstructive sleep apnea (adult) (pediatric): Secondary | ICD-10-CM

## 2021-12-06 DIAGNOSIS — K5521 Angiodysplasia of colon with hemorrhage: Secondary | ICD-10-CM

## 2021-12-06 DIAGNOSIS — N400 Enlarged prostate without lower urinary tract symptoms: Secondary | ICD-10-CM | POA: Diagnosis not present

## 2021-12-06 DIAGNOSIS — K5730 Diverticulosis of large intestine without perforation or abscess without bleeding: Secondary | ICD-10-CM | POA: Diagnosis not present

## 2021-12-06 HISTORY — PX: COLONOSCOPY WITH PROPOFOL: SHX5780

## 2021-12-06 HISTORY — PX: HOT HEMOSTASIS: SHX5433

## 2021-12-06 HISTORY — PX: HEMOSTASIS CLIP PLACEMENT: SHX6857

## 2021-12-06 HISTORY — PX: POLYPECTOMY: SHX5525

## 2021-12-06 LAB — CBC
HCT: 29.8 % — ABNORMAL LOW (ref 39.0–52.0)
Hemoglobin: 8.8 g/dL — ABNORMAL LOW (ref 13.0–17.0)
MCH: 30.3 pg (ref 26.0–34.0)
MCHC: 29.5 g/dL — ABNORMAL LOW (ref 30.0–36.0)
MCV: 102.8 fL — ABNORMAL HIGH (ref 80.0–100.0)
Platelets: 126 10*3/uL — ABNORMAL LOW (ref 150–400)
RBC: 2.9 MIL/uL — ABNORMAL LOW (ref 4.22–5.81)
RDW: 16.1 % — ABNORMAL HIGH (ref 11.5–15.5)
WBC: 2.3 10*3/uL — ABNORMAL LOW (ref 4.0–10.5)
nRBC: 0 % (ref 0.0–0.2)

## 2021-12-06 LAB — RENAL FUNCTION PANEL
Albumin: 2.5 g/dL — ABNORMAL LOW (ref 3.5–5.0)
Anion gap: 4 — ABNORMAL LOW (ref 5–15)
BUN: 15 mg/dL (ref 8–23)
CO2: 36 mmol/L — ABNORMAL HIGH (ref 22–32)
Calcium: 8.3 mg/dL — ABNORMAL LOW (ref 8.9–10.3)
Chloride: 102 mmol/L (ref 98–111)
Creatinine, Ser: 1.17 mg/dL (ref 0.61–1.24)
GFR, Estimated: >60 mL/min (ref >60)
Glucose, Bld: 81 mg/dL (ref 70–99)
Phosphorus: 3.1 mg/dL (ref 2.5–4.6)
Potassium: 3.7 mmol/L (ref 3.5–5.1)
Sodium: 142 mmol/L (ref 135–145)

## 2021-12-06 LAB — HEMOGLOBIN AND HEMATOCRIT, BLOOD
HCT: 29.4 % — ABNORMAL LOW (ref 39.0–52.0)
Hemoglobin: 8.7 g/dL — ABNORMAL LOW (ref 13.0–17.0)

## 2021-12-06 LAB — MAGNESIUM: Magnesium: 1.8 mg/dL (ref 1.7–2.4)

## 2021-12-06 LAB — GLUCOSE, CAPILLARY
Glucose-Capillary: 109 mg/dL — ABNORMAL HIGH (ref 70–99)
Glucose-Capillary: 90 mg/dL (ref 70–99)
Glucose-Capillary: 159 mg/dL — ABNORMAL HIGH (ref 70–99)
Glucose-Capillary: 128 mg/dL — ABNORMAL HIGH (ref 70–99)

## 2021-12-06 LAB — VITAMIN B12: Vitamin B-12: 2414 pg/mL — ABNORMAL HIGH (ref 180–914)

## 2021-12-06 LAB — IRON AND TIBC
Iron: 38 ug/dL — ABNORMAL LOW (ref 45–182)
Saturation Ratios: 19 % (ref 17.9–39.5)
TIBC: 199 ug/dL — ABNORMAL LOW (ref 250–450)
UIBC: 161 ug/dL

## 2021-12-06 LAB — FERRITIN: Ferritin: 103 ng/mL (ref 24–336)

## 2021-12-06 LAB — RETICULOCYTES
Immature Retic Fract: 22.2 % — ABNORMAL HIGH (ref 2.3–15.9)
RBC.: 2.77 MIL/uL — ABNORMAL LOW (ref 4.22–5.81)
Retic Count, Absolute: 67.6 10*3/uL (ref 19.0–186.0)
Retic Ct Pct: 2.4 % (ref 0.4–3.1)

## 2021-12-06 LAB — FOLATE: Folate: 10.6 ng/mL (ref >5.9)

## 2021-12-06 SURGERY — COLONOSCOPY WITH PROPOFOL
Anesthesia: Monitor Anesthesia Care

## 2021-12-06 MED ORDER — SODIUM CHLORIDE 0.9 % IV SOLN: INTRAVENOUS | Status: DC

## 2021-12-06 MED ORDER — PROPOFOL 500 MG/50ML IV EMUL
INTRAVENOUS | Status: DC | PRN
  Administered 2021-12-06: 120 ug/kg/min via INTRAVENOUS

## 2021-12-06 MED ORDER — PROPOFOL 1000 MG/100ML IV EMUL
INTRAVENOUS | Status: AC
  Filled 2021-12-06: qty 200

## 2021-12-06 MED ORDER — APIXABAN 5 MG PO TABS
5.0000 mg | ORAL_TABLET | Freq: Two times a day (BID) | ORAL | Status: DC
  Administered 2021-12-06 – 2021-12-09 (×6): 5 mg via ORAL
  Filled 2021-12-06 (×6): qty 1

## 2021-12-06 MED ORDER — LACTATED RINGERS IV SOLN: INTRAVENOUS | Status: DC

## 2021-12-06 SURGICAL SUPPLY — 22 items

## 2021-12-06 NOTE — Evaluation (Signed)
Occupational Therapy Evaluation ?Patient Details ?Name: Manuel [name] ?MRN: [id number] ?DOB: Sep 18, 1945 ?Today's Date: 12/06/2021 ? ? ?History of Present Illness Patient is a 77 year old male who presented to the hospital with melena. patient was admited with ABLA with possible GI bleed. patient was noted to have been recently hospitalized 4/21 with COPD exacerbation and CHF.   PMH: OSA on CPAP nightly, hypertension, hyperlipidemia, type 2 diabetes, COPD, chronic hypoxia on 2 L nasal cannula at baseline, former tobacco user, AAA, history of GI bleed, cirrhosis of liver, CKD, BPH, history of nonbleeding gastric ulcer, acute on chronic CHF  ? ?Clinical Impression ?  ?Patient is a 76 year old male who was noted to be admitted for above. Patient was living at home with supplemental O2 with wife prior level. Patient was noted to have decreased functional activity tolerance on this date with O2 noted to drop to 81% on 3L/min at sink with HR increased to 135 bpm.patient endorses not being at his baseline for activity tolerance at this time. Patient anticipated to improve during acute stay to not need further skilled OT services in next level of care.  Patient reported have some shortness of breath. Patient would continue to benefit from skilled OT services at this time while admitted to address noted deficits in order to improve overall safety and independence in ADLs.  ?  ?   ? ?Recommendations for follow up therapy are one component of a multi-disciplinary discharge planning process, led by the attending physician.  Recommendations may be updated based on patient status, additional functional criteria and insurance authorization.  ? ?Follow Up Recommendations ? No OT follow up  ?  ?Assistance Recommended at Discharge Set up Supervision/Assistance  ?Patient can return home with the following A little help with walking and/or transfers;A little help with bathing/dressing/bathroom;Assistance with cooking/housework;Help with  stairs or ramp for entrance ? ?  ?Functional Status Assessment ? Patient has had a recent decline in their functional status and demonstrates the ability to make significant improvements in function in a reasonable and predictable amount of time.  ?Equipment Recommendations ?    ?  ?Recommendations for Other Services   ? ? ?  ?Precautions / Restrictions Precautions ?Precautions: Fall ?Precaution Comments: Monitor O2 and HR ?Restrictions ?Weight Bearing Restrictions: No  ? ?  ? ?Mobility Bed Mobility ?Overal bed mobility: Modified Independent ?  ?  ?  ?  ?  ?  ?  ?  ? ?Transfers ?  ?  ?  ?  ?  ?  ?  ?  ?  ?  ?  ? ?  ?Balance Overall balance assessment: No apparent balance deficits (not formally assessed) ?  ?  ?  ?  ?  ?  ?  ?  ?  ?  ?  ?  ?  ?  ?  ?  ?  ?  ?   ? ?ADL either performed or assessed with clinical judgement  ? ?ADL Overall ADL's : Needs assistance/impaired ?Eating/Feeding: NPO ?Eating/Feeding Details (indicate cue type and reason): patient is pending colonoscopy at this time. ?Grooming: Dance movement psychotherapist;Wash/dry hands;Oral care;Standing ?Grooming Details (indicate cue type and reason): patient completed at sink with noted drop in O2 to 81% on breif RA with washing face and 3L/min with patients HR noted to increase to 135 bpm. patient was returned to sitting with O2 up oto 97% within 1 min with HR returned to 90s. ?Upper Body Bathing: Sitting;Supervision/ safety ?  ?Lower Body Bathing: Sitting/lateral  leans;Minimal assistance ?  ?Upper Body Dressing : Supervision/safety;Sitting ?  ?Lower Body Dressing: Sitting/lateral leans;Sit to/from stand;Minimal assistance ?  ?Toilet Transfer: Supervision/safety ?Toilet Transfer Details (indicate cue type and reason): patient has been transfering himself from bed to 3 in 1 commode with no AD ?Toileting- Clothing Manipulation and Hygiene: Supervision/safety;Sit to/from stand ?  ?  ?  ?Functional mobility during ADLs: Min guard ?   ? ? ? ?Vision Baseline Vision/History: 1  Wears glasses ?Vision Assessment?: No apparent visual deficits  ?   ?Perception   ?  ?Praxis   ?  ? ?Pertinent Vitals/Pain Pain Assessment ?Pain Assessment: No/denies pain  ? ? ? ?Hand Dominance Right ?  ?Extremity/Trunk Assessment Upper Extremity Assessment ?Upper Extremity Assessment: Overall WFL for tasks assessed ?  ?Lower Extremity Assessment ?Lower Extremity Assessment: Defer to PT evaluation ?  ?Cervical / Trunk Assessment ?Cervical / Trunk Assessment: Normal ?  ?Communication Communication ?Communication: No difficulties ?  ?Cognition Arousal/Alertness: Awake/alert ?Behavior During Therapy: [facility] for tasks assessed/performed ?Overall Cognitive Status: Within Functional Limits for tasks assessed ?  ?  ?  ?  ?  ?  ?  ?  ?  ?  ?  ?  ?  ?  ?  ?  ?  ?  ?  ?General Comments    ? ?  ?Exercises   ?  ?Shoulder Instructions    ? ? ?Home Living Family/patient expects to be discharged to:: Private residence ?Living Arrangements: Spouse/significant other;Children ?Available Help at Discharge: Family;Available 24 hours/day ?Type of Home: House ?  ?  ?Entrance Stairs-Rails: Right ?Home Layout: Two level;Full bath on main level;Able to live on main level with bedroom/bathroom ?  ?  ?Bathroom Shower/Tub: Tub/shower unit ?  ?  ?  ?  ?  ?  ?Additional Comments: patient reported having used O2 at home when he needed it ?  ? ?  ?Prior Functioning/Environment Prior Level of Function : Independent/Modified Independent ?  ?  ?  ?  ?  ?  ?Mobility Comments: no AD use ?ADLs Comments: does IADLs ?  ? ?  ?  ?OT Problem List: Decreased strength;Decreased range of motion;Decreased activity tolerance;Impaired balance (sitting and/or standing) ?  ?   ?OT Treatment/Interventions: Self-care/ADL training;Therapeutic exercise;Therapeutic activities;Patient/family education;Balance training  ?  ?OT Goals(Current goals can be found in the care plan section) Acute Rehab OT Goals ?Patient Stated Goal: to get stronger ?OT Goal Formulation: With  patient ?Time For Goal Achievement: 12/20/21 ?Potential to Achieve Goals: Good  ?OT Frequency: Min 2X/week ?  ? ?Co-evaluation   ?  ?  ?  ?  ? ?  ?AM-PAC OT "6 Clicks" Daily Activity     ?Outcome Measure Help from another person eating meals?: None ?Help from another person taking care of personal grooming?: None ?Help from another person toileting, which includes using toliet, bedpan, or urinal?: A Little ?Help from another person bathing (including washing, rinsing, drying)?: A Little ?Help from another person to put on and taking off regular upper body clothing?: A Little ?Help from another person to put on and taking off regular lower body clothing?: A Little ?6 Click Score: 20 ?  ?End of Session Equipment Utilized During Treatment: Oxygen ?Nurse Communication: Mobility status ? ?Activity Tolerance: Patient tolerated treatment well ?Patient left: Other (comment) (ok to participate in session) ? ?OT Visit Diagnosis: Unsteadiness on feet (R26.81);Other abnormalities of gait and mobility (R26.89);Muscle weakness (generalized) (M62.81)  ?              ?  Time: 3968-8648 ?OT Time Calculation (min): 18 min ?Charges:  OT General Charges ?$OT Visit: 1 Visit ?OT Evaluation ?$OT Eval Low Complexity: 1 Low ? ?Mecca Barga OTR/L, MS ?Acute Rehabilitation Department ?Office# [phone] ?Pager# [phone] ? ? ?[place] ?12/06/2021, 9:02 AM ?

## 2021-12-06 NOTE — Evaluation (Signed)
Physical Therapy Evaluation ?Patient Details ?Name: Manuel [name] ?MRN: [id number] ?DOB: 02/22/1946 ?Today's Date: 12/06/2021 ? ?History of Present Illness ? Patient is a 76 year old male who presented to the hospital with melena. patient was admited with ABLA with possible GI bleed. patient was noted to have been recently hospitalized 4/21 with COPD exacerbation and CHF.   PMH: OSA on CPAP nightly, hypertension, hyperlipidemia, type 2 diabetes, COPD, chronic hypoxia on 2 L nasal cannula at baseline, former tobacco user, AAA, history of GI bleed, cirrhosis of liver, CKD, BPH, history of nonbleeding gastric ulcer, acute on chronic CHF  ?Clinical Impression ? On eval, pt was Min guard assist for mobility. He walked ~250 feet around the unit. He is unsteady at times but he did not have an overt LOB during session. Unable to get O2 sat reading during session so kept him on O2 just in case. He tolerated activity well. HR 115 bpm during session. Do not anticipate any f/u PT needs at this time. Will plan to follow pt during hospital stay.    ?   ? ?Recommendations for follow up therapy are one component of a multi-disciplinary discharge planning process, led by the attending physician.  Recommendations may be updated based on patient status, additional functional criteria and insurance authorization. ? ?Follow Up Recommendations No PT follow up ? ?  ?Assistance Recommended at Discharge PRN  ?Patient can return home with the following ? Assistance with cooking/housework;Assist for transportation ? ?  ?Equipment Recommendations None recommended by PT  ?Recommendations for Other Services ?    ?  ?Functional Status Assessment Patient has had a recent decline in their functional status and demonstrates the ability to make significant improvements in function in a reasonable and predictable amount of time.  ? ?  ?Precautions / Restrictions Precautions ?Precautions: Fall ?Precaution Comments: Monitor O2 and  HR ?Restrictions ?Weight Bearing Restrictions: No  ? ?  ? ?Mobility ? Bed Mobility ?Overal bed mobility: Modified Independent ?  ?  ?  ?  ?  ?  ?  ?  ? ?Transfers ?Overall transfer level: Modified independent ?  ?  ?  ?  ?  ?  ?  ?  ?  ?  ? ?Ambulation/Gait ?Ambulation/Gait assistance: Min guard ?Gait Distance (Feet): 250 Feet ?Assistive device: None (vs pushing O2 tank initially) ?Gait Pattern/deviations: Step-through pattern, Decreased stride length ?  ?  ?  ?General Gait Details: Unsteady at times but did not have an overt LOB. Remained on O2 due to unable to get pulse ox O2 reading. ? ?Stairs ?  ?  ?  ?  ?  ? ?Wheelchair Mobility ?  ? ?Modified Rankin (Stroke Patients Only) ?  ? ?  ? ?Balance Overall balance assessment: Mild deficits observed, not formally tested ?  ?  ?  ?  ?  ?  ?  ?  ?  ?  ?  ?  ?  ?  ?  ?  ?  ?  ?   ? ? ? ?Pertinent Vitals/Pain Pain Assessment ?Pain Assessment: No/denies pain  ? ? ?Home Living Family/patient expects to be discharged to:: Private residence ?Living Arrangements: Spouse/significant other;Children ?Available Help at Discharge: Family;Available 24 hours/day ?Type of Home: House ?Home Access: Stairs to enter ?Entrance Stairs-Rails: Right ?Entrance Stairs-Number of Steps: 4 ?  ?Home Layout: Two level;Full bath on main level;Able to live on main level with bedroom/bathroom ?Home Equipment: Kasandra Knudsen - single point ?Additional Comments: patient reported having used O2 at  home when he needed it  ?  ?Prior Function Prior Level of Function : Independent/Modified Independent ?  ?  ?  ?  ?  ?  ?Mobility Comments: no AD use ?ADLs Comments: does IADLs ?  ? ? ?Hand Dominance  ? Dominant Hand: Right ? ?  ?Extremity/Trunk Assessment  ? Upper Extremity Assessment ?Upper Extremity Assessment: Defer to OT evaluation ?  ? ?Lower Extremity Assessment ?Lower Extremity Assessment: Generalized weakness ?  ? ?Cervical / Trunk Assessment ?Cervical / Trunk Assessment: Normal  ?Communication  ? Communication:  No difficulties  ?Cognition Arousal/Alertness: Awake/alert ?Behavior During Therapy: [facility] for tasks assessed/performed ?Overall Cognitive Status: Within Functional Limits for tasks assessed ?  ?  ?  ?  ?  ?  ?  ?  ?  ?  ?  ?  ?  ?  ?  ?  ?  ?  ?  ? ?  ?General Comments   ? ?  ?Exercises    ? ?Assessment/Plan  ?  ?PT Assessment Patient needs continued PT services  ?PT Problem List Decreased mobility;Decreased balance ? ?   ?  ?PT Treatment Interventions DME instruction;Gait training;Stair training;Therapeutic activities;Functional mobility training;Therapeutic exercise;Balance training;Neuromuscular re-education   ? ?PT Goals (Current goals can be found in the Care Plan section)  ?Acute Rehab PT Goals ?Patient Stated Goal: Go home ?PT Goal Formulation: With patient/family ?Time For Goal Achievement: 12/20/21 ?Potential to Achieve Goals: Good ? ?  ?Frequency Min 3X/week ?  ? ? ?Co-evaluation   ?  ?  ?  ?  ? ? ?  ?AM-PAC PT "6 Clicks" Mobility  ?Outcome Measure Help needed turning from your back to your side while in a flat bed without using bedrails?: None ?Help needed moving from lying on your back to sitting on the side of a flat bed without using bedrails?: None ?Help needed moving to and from a bed to a chair (including a wheelchair)?: A Little ?Help needed standing up from a chair using your arms (e.g., wheelchair or bedside chair)?: A Little ?Help needed to walk in hospital room?: A Little ?Help needed climbing 3-5 steps with a railing? : A Little ?6 Click Score: 20 ? ?  ?End of Session Equipment Utilized During Treatment: Oxygen;Gait belt ?Activity Tolerance: Patient tolerated treatment well ?Patient left: in bed;with call bell/phone within reach;with bed alarm set;with family/visitor present ?  ?PT Visit Diagnosis: Unsteadiness on feet (R26.81) ?  ? ?Time: 7619-5093 ?PT Time Calculation (min) (ACUTE ONLY): 20 min ? ? ?Charges:   PT Evaluation ?$PT Eval Low Complexity: 1 Low ?  ?  ?   ? ? ? ? ? ?Paloma Grange P,  PT ?Acute Rehabilitation  ?Office: [phone] ?Pager: [phone] ? ?  ? ?

## 2021-12-06 NOTE — Progress Notes (Signed)
?PROGRESS NOTE ? ? ? ?Manuel [name]  ZOX:[id number] DOB: Apr 22, 1946 DOA: 12/04/2021 ?PCP: [name], DO  ? ?Brief Narrative:  ?76 year old male with history of COPD, chronic respiratory failure with hypoxia on 3 L oxygen via nasal cannula, OSA on CPAP, cirrhosis of liver, A-fib on Eliquis, hypertension, gastric ulcer and BPH presented with melena and admitted for upper GI bleeding/ABLA with hemoglobin of 10 (13 about a month ago) and Hemoccult positive.  He was started on IV Protonix.  GI was consulted.  EGD on 12/05/2021 showed gastritis but no significant other findings. ? ?Assessment & Plan: ?  ?Acute blood loss anemia ?GI bleeding ?-Presented with melena.  Hemoglobin 10 on presentation (13 about a month ago) and Hemoccult positive ?-EGD on 12/05/2021 showed gastritis but no other significant findings.  Currently on IV Protonix.  GI planning for colonoscopy today.  Monitor H&H.  Hemoglobin 8.7 today. ? ?Chronic respiratory failure with hypoxia  ?COPD (chronic obstructive pulmonary disease)  ?-Stable on home 3 L. ?-Continue Xopenex as needed ? ?Permanent atrial fibrillation  ?-Rate controlled on reduced dose of home Cardizem ?-Holding Eliquis in the setting of GI bleed. ?-Outpatient follow-up with cardiology ?  ?Protein-calorie malnutrition, severe ?-Follow nutrition recommendations ?  ?OSA (obstructive sleep apnea) ?-Continue CPAP at night ?  ?Controlled diabetes mellitus type 2 ?-A1c 6.6% about 2 months ago.  On Farxiga and metformin at home which are currently on hold. ? ?BPH ?-Continue Cardura ? ?Chronic diastolic heart failure ?-Currently compensated.  Lasix on hold.  Strict input and output.  Daily weights.  Fluid restriction ? ?Hypertension ?-Blood pressure on the lower side.  Continue Cardizem and Cardura.  Lasix on hold ? ?Leukopenia ?-mild.  Monitor intermittently ? ?Thrombocytopenia ?-Repeat a.m. labs. ? ? ?DVT prophylaxis: SCDs ?Code Status: Full ?Family Communication: None at bedside ?Disposition  Plan: ?Status is: Inpatient ?Remains inpatient appropriate because: Of severity of illness ? ? ? ?Consultants: GI ? ?Procedures: EGD on 12/05/2021 ? ?Antimicrobials: None ? ? ?Subjective: ?Patient seen and examined at bedside.  No overnight fever, nausea, vomiting, worsening abdominal pain reported. ? ?Objective: ?Vitals:  ? 12/05/21 1511 12/05/21 1525 12/06/21 0344 12/06/21 1019  ?BP: 111/63 103/65 113/66 115/74  ?Pulse: 87 77 93   ?Resp: (!) 22 (!) 22 18   ?Temp:   97.9 ?F (36.6 ?C)   ?TempSrc:   Oral   ?SpO2: 95% 97% 99%   ?Weight:      ?Height:      ? ? ?Intake/Output Summary (Last 24 hours) at 12/06/2021 1048 ?Last data filed at 12/06/2021 0200 ?Gross per 24 hour  ?Intake 2558.23 ml  ?Output --  ?Net 2558.23 ml  ? ?Filed Weights  ? 12/04/21 1104 12/05/21 1352  ?Weight: 81.3 kg 81.3 kg  ? ? ?Examination: ? ?General exam: Appears calm and comfortable.  Currently on 3 L oxygen via nasal cannula.  Looks chronically ill and deconditioned. ?Respiratory system: Bilateral decreased breath sounds at bases with some scattered crackles ?Cardiovascular system: S1 & S2 heard, Rate controlled ?Gastrointestinal system: Abdomen is nondistended, soft and nontender. Normal bowel sounds heard. ?Extremities: No cyanosis, clubbing, edema  ?Central nervous system: Alert and oriented. No focal neurological deficits. Moving extremities ?Skin: No rashes, lesions or ulcers ?Psychiatry: Affect is mostly flat.  No signs of agitation. ? ? ?Data Reviewed: I have personally reviewed following labs and imaging studies ? ?CBC: ?Recent Labs  ?Lab 12/04/21 ?1118 12/04/21 ?1757 12/04/21 ?2322 12/05/21 ?0504 12/05/21 ?1748 12/06/21 ?0132 12/06/21 ?8119  ?WBC 3.8*  --   --  2.9*  --  2.3*  --   ?NEUTROABS 3.3  --   --   --   --   --   --   ?HGB 10.0*   < > 8.7* 8.8* 8.8* 8.8* 8.7*  ?HCT 33.0*   < > 29.4* 29.6* 28.9* 29.8* 29.4*  ?MCV 98.8  --   --  103.5*  --  102.8*  --   ?PLT 157  --   --  155  --  126*  --   ? < > = values in this interval not  displayed.  ? ?Basic Metabolic Panel: ?Recent Labs  ?Lab 12/04/21 ?1118 12/05/21 ?0504 12/06/21 ?0132  ?NA 142 144 142  ?K 4.4 4.1 3.7  ?CL 101 102 102  ?CO2 35* 37* 36*  ?GLUCOSE 125* 93 81  ?BUN 28* 19 15  ?CREATININE 1.26* 1.09 1.17  ?CALCIUM 9.0 8.4* 8.3*  ?MG  --   --  1.8  ?PHOS  --   --  3.1  ? ?GFR: ?Estimated Creatinine Clearance: 61.8 mL/min (by C-G formula based on SCr of 1.17 mg/dL). ?Liver Function Tests: ?Recent Labs  ?Lab 12/04/21 ?1118 12/05/21 ?0504 12/06/21 ?0132  ?AST 10* 10*  --   ?ALT 7 9  --   ?ALKPHOS 57 49  --   ?BILITOT 0.8 0.9  --   ?PROT 6.0* 5.0*  --   ?ALBUMIN 3.5 2.5* 2.5*  ? ?No results for input(s): LIPASE, AMYLASE in the last 168 hours. ?No results for input(s): AMMONIA in the last 168 hours. ?Coagulation Profile: ?No results for input(s): INR, PROTIME in the last 168 hours. ?Cardiac Enzymes: ?No results for input(s): CKTOTAL, CKMB, CKMBINDEX, TROPONINI in the last 168 hours. ?BNP (last 3 results) ?No results for input(s): PROBNP in the last 8760 hours. ?HbA1C: ?No results for input(s): HGBA1C in the last 72 hours. ?CBG: ?Recent Labs  ?Lab 12/05/21 ?1126 12/05/21 ?1401 12/05/21 ?1704 12/05/21 ?2059 12/06/21 ?0740  ?GLUCAP 106* 79 95 89 109*  ? ?Lipid Profile: ?No results for input(s): CHOL, HDL, LDLCALC, TRIG, CHOLHDL, LDLDIRECT in the last 72 hours. ?Thyroid Function Tests: ?No results for input(s): TSH, T4TOTAL, FREET4, T3FREE, THYROIDAB in the last 72 hours. ?Anemia Panel: ?Recent Labs  ?  12/06/21 ?0132  ?IEPPIRJJ88 2,414*  ?FOLATE 10.6  ?FERRITIN 103  ?TIBC 199*  ?IRON 38*  ?RETICCTPCT 2.4  ? ?Sepsis Labs: ?No results for input(s): PROCALCITON, LATICACIDVEN in the last 168 hours. ? ?No results found for this or any previous visit (from the past 240 hour(s)).  ? ? ? ? ? ?Radiology Studies: ?No results found. ? ? ? ? ? ?Scheduled Meds: ? diltiazem  120 mg Oral Daily  ? doxazosin  4 mg Oral QHS  ? escitalopram  5 mg Oral Daily  ? feeding supplement  237 mL Oral BID BM  ? insulin  aspart  0-9 Units Subcutaneous TID WC  ? multivitamin with minerals  1 tablet Oral Daily  ? [START ON 12/08/2021] pantoprazole  40 mg Intravenous Q12H  ? ?Continuous Infusions: ? pantoprazole 8 mg/hr (12/06/21 0130)  ? ? ? ? ? ? ? ? ?Aline August, MD ?[facility] ?12/06/2021, 10:48 AM  ? ?

## 2021-12-06 NOTE — Interval H&P Note (Signed)
History and Physical Interval Note: ? ?12/06/2021 ?12:05 PM ? ?Manuel [name]  has presented today for surgery, with the diagnosis of Hematochezia.  The various methods of treatment have been discussed with the patient and family. After consideration of risks, benefits and other options for treatment, the patient has consented to  Procedure(s): ?COLONOSCOPY WITH PROPOFOL (N/A) as a surgical intervention.  The patient's history has been reviewed, patient examined, no change in status, stable for surgery.  I have reviewed the patient's chart and labs.  Questions were answered to the patient's satisfaction.   ? ? ?[name] D ? ? ?

## 2021-12-06 NOTE — Transfer of Care (Signed)
Immediate Anesthesia Transfer of Care Note ? ?Patient: Manuel [name] ? ?Procedure(s) Performed: COLONOSCOPY WITH PROPOFOL ?HOT HEMOSTASIS (ARGON PLASMA COAGULATION/BICAP) ?HEMOSTASIS CLIP PLACEMENT ?POLYPECTOMY ? ?Patient Location: PACU and Endoscopy Unit ? ?Anesthesia Type:MAC ? ?Level of Consciousness: drowsy and patient cooperative ? ?Airway & Oxygen Therapy: Patient Spontanous Breathing and Patient connected to face mask oxygen ? ?Post-op Assessment: Report given to RN and Post -op Vital signs reviewed and stable ? ?Post vital signs: Reviewed and stable ? ?Last Vitals:  ?Vitals Value Taken Time  ?BP 120/70 12/06/21 1255  ?Temp 36.6 ?C 12/06/21 1255  ?Pulse 102 12/06/21 1255  ?Resp 34 12/06/21 1255  ?SpO2 98 % 12/06/21 1255  ? ? ?Last Pain:  ?Vitals:  ? 12/06/21 1255  ?TempSrc: Temporal  ?PainSc: 0-No pain  ?   ? ?  ? ?Complications: No notable events documented. ?

## 2021-12-06 NOTE — Progress Notes (Signed)
ANTICOAGULATION CONSULT NOTE - Initial Consult ? ?Pharmacy Consult for Eliquis ?Indication: atrial fibrillation ? ?Allergies  ?Allergen Reactions  ? Sulfa Antibiotics Shortness Of Breath  ?  Headaches   ? Colchicine   ?  Other reaction(s): diarrhea  ? Labetalol Hcl   ?  Other reaction(s): dysuria  ? Tramadol Hcl   ?  Other reaction(s): nausea  ? ? ?Patient Measurements: ?Height: _0  (190.5 cm) ?Weight: 81.3 kg (179 lb 3.7 oz) ?IBW/kg (Calculated) : 84.5 ? ?Vital Signs: ?Temp: 97.6 ?F (36.4 ?C) (05/17 1338) ?Temp Source: Oral (05/17 1338) ?BP: 105/71 (05/17 1338) ?Pulse Rate: 94 (05/17 1338) ? ?Labs: ?Recent Labs  ?  12/04/21 ?1118 12/04/21 ?1757 12/05/21 ?0504 12/05/21 ?1748 12/06/21 ?0132 12/06/21 ?0912  ?HGB 10.0*   < > 8.8* 8.8* 8.8* 8.7*  ?HCT 33.0*   < > 29.6* 28.9* 29.8* 29.4*  ?PLT 157  --  155  --  126*  --   ?CREATININE 1.26*  --  1.09  --  1.17  --   ? < > = values in this interval not displayed.  ? ? ?Estimated Creatinine Clearance: 61.8 mL/min (by C-G formula based on SCr of 1.17 mg/dL). ? ? ?Medical History: ?Past Medical History:  ?Diagnosis Date  ? AAA (abdominal aortic aneurysm) ([place])   ? per cardiologist note dated 03-14-2018 last duplex , 30-37m  (dr gEinar Gip  ? Acute GI bleeding 12/19/2017  ? Allergic rhinitis, seasonal   ? Anemia secondary to renal failure   ? Anticoagulant long-term use   ? ELIQUIS  ? Arthritis   ? LOWER BACK  ? B12 deficiency   ? BPH (benign prostatic hyperplasia)   ? Cirrhosis of liver ([facility] 10/04/2021  ? CKD (chronic kidney disease), stage III ([place]   ? Colon cancer ([place] 05/29/2018  ? No confirmed pathology indicating colon cancer.  Reports indicate benign findings on colonoscopy.  Had pneumatosis intestinalis followed by [facility] with colostomy and reversal.  KDamaris HippoFNP-C  ? Colonic stricture ([place] 12/13/2017  ? Colostomy present ([facility]   ? Congenital absence of left kidney [facility] dr detarding  ? per CT 12-11-2017 and 01-02-2018   ? COPD with hypoxia ([place] 10/04/2021  ? Duodenal ulcer 12/20/2017  ? Dyslipidemia   ? Esophageal candidiasis ([place] 12/19/2017  ? Gastric ulcer without hemorrhage or perforation 12/20/2017  ? History of bowel resection 12/13/2017  ? for sigmoid stricture-- emergency sigmoidectomy  ? Hypercholesteremia 09/19/2018  ? Hyperparathyroidism, secondary renal ([place]   ? Hypertension   ? Left carotid bruit   ? OSA on CPAP   ? [facility]  ? Permanent atrial fibrillation ([facility] 2017  ? cardiologist-  dr gEinar Gip ? Solitary right kidney   ? Type 2 diabetes mellitus ([facility]   ? followed by pcp  ? Wears glasses   ? Wears partial dentures   ? UPPER AND LOWER  ? ? ?Medications:  ?Scheduled:  ? apixaban  5 mg Oral BID  ? diltiazem  120 mg Oral Daily  ? doxazosin  4 mg Oral QHS  ? escitalopram  5 mg Oral Daily  ? feeding supplement  237 mL Oral BID BM  ? insulin aspart  0-9 Units Subcutaneous TID WC  ? multivitamin with minerals  1 tablet Oral Daily  ? [START ON 12/08/2021] pantoprazole  40 mg Intravenous Q12H  ? ?Infusions:  ? pantoprazole 8 mg/hr (12/06/21 0130)  ? ?PRN: albuterol, ondansetron **OR** ondansetron (ZOFRAN) IV ? ?  Assessment: ?76 yo male on chronic Eliquis for hix of Afib which has been on hold since admission for possible GIB.  He is now s/p EGD which showed gastritis and colonoscopy. Pharmacy is now consulted to resume as ok per GI. ? ?Goal of Therapy:  ?Therapeutic anticoagulation, prevention of stroke ?  ?Plan:  ?Resume Eliquis 3m BID ?No dose adjustments needed, Pharmacy will sign off ? ?EPeggyann Juba PharmD, BCPS ?Pharmacy: [phone]?12/06/2021,4:06 PM ? ? ?

## 2021-12-06 NOTE — Anesthesia Preprocedure Evaluation (Addendum)
Anesthesia Evaluation  ?Patient identified by MRN, date of birth, ID band ?Patient awake ? ? ? ?Reviewed: ?Allergy & Precautions, H&P , NPO status , Patient's Chart, lab work & pertinent test results ? ?Airway ?Mallampati: II ? ?TM Distance: >3 FB ?Neck ROM: Full ? ? ? Dental ?no notable dental hx. ?(+) Partial Lower, Partial Upper, Dental Advisory Given ?  ?Pulmonary ?sleep apnea , COPD,  COPD inhaler, former smoker,  ?  ?Pulmonary exam normal ?breath sounds clear to auscultation ? ? ? ? ? ? Cardiovascular ?hypertension, Pt. on medications ?+ dysrhythmias Atrial Fibrillation  ?Rhythm:Regular Rate:Normal ? ? ?  ?Neuro/Psych ?Depression negative neurological ROS ?   ? GI/Hepatic ?Neg liver ROS, PUD,   ?Endo/Other  ?diabetes, Type 2, Oral Hypoglycemic Agents ? Renal/GU ?Renal disease  ?negative genitourinary ?  ?Musculoskeletal ? ?(+) Arthritis ,  ? Abdominal ?  ?Peds ? Hematology ? ?(+) Blood dyscrasia, anemia ,   ?Anesthesia Other Findings ? ? Reproductive/Obstetrics ?negative OB ROS ? ?  ? ? ? ? ? ? ? ? ? ? ? ? ? ?  ?  ? ? ? ? ? ? ? ?Anesthesia Physical ?Anesthesia Plan ? ?ASA: 3 ? ?Anesthesia Plan: MAC  ? ?Post-op Pain Management: Minimal or no pain anticipated  ? ?Induction: Intravenous ? ?PONV Risk Score and Plan: 1 and Propofol infusion ? ?Airway Management Planned: Natural Airway and Simple Face Mask ? ?Additional Equipment:  ? ?Intra-op Plan:  ? ?Post-operative Plan:  ? ?Informed Consent: I have reviewed the patients History and Physical, chart, labs and discussed the procedure including the risks, benefits and alternatives for the proposed anesthesia with the patient or authorized representative who has indicated his/her understanding and acceptance.  ? ? ? ?Dental advisory given ? ?Plan Discussed with: CRNA ? ?Anesthesia Plan Comments:   ? ? ? ? ? ? ?Anesthesia Quick Evaluation ? ?

## 2021-12-06 NOTE — Op Note (Signed)
[facility] ?Patient Name: Manuel [name] ?Procedure Date: 12/06/2021 ?MRN: [id number] ?Attending MD: Carol Ada , MD ?Date of Birth: April 26, 1946 ?CSN: [id number] ?Age: 76 ?Admit Type: Inpatient ?Procedure:                Colonoscopy ?Indications:              Hematochezia, Melena ?Providers:                Carol Ada, MD, Dulcy Fanny, Alphonzo Grieve  ?                          Leighton Roach, Technician ?Referring MD:              ?Medicines:                Propofol per Anesthesia ?Complications:            No immediate complications. ?Estimated Blood Loss:     Estimated blood loss: none. ?Procedure:                Pre-Anesthesia Assessment: ?                          - Prior to the procedure, a History and Physical  ?                          was performed, and patient medications and  ?                          allergies were reviewed. The patient's tolerance of  ?                          previous anesthesia was also reviewed. The risks  ?                          and benefits of the procedure and the sedation  ?                          options and risks were discussed with the patient.  ?                          All questions were answered, and informed consent  ?                          was obtained. Prior Anticoagulants: The patient has  ?                          taken Eliquis (apixaban), last dose was 2 days  ?                          prior to procedure. ASA Grade Assessment: III - A  ?                          patient with severe systemic disease. After  ?                          reviewing the risks  and benefits, the patient was  ?                          deemed in satisfactory condition to undergo the  ?                          procedure. ?                          - Sedation was administered by an anesthesia  ?                          professional. Deep sedation was attained. ?                          After obtaining informed consent, the colonoscope  ?                          was  passed under direct vision. Throughout the  ?                          procedure, the patient's blood pressure, pulse, and  ?                          oxygen saturations were monitored continuously. The  ?                          CF-HQ190L ([id number]) Olympus colonoscope was  ?                          introduced through the anus and advanced to the the  ?                          cecum, identified by appendiceal orifice and  ?                          ileocecal valve. The colonoscopy was performed  ?                          without difficulty. The patient tolerated the  ?                          procedure well. The quality of the bowel  ?                          preparation was evaluated using the BBPS [facility]  ?                          Bowel Preparation Scale) with scores of: Right  ?                          Colon = 2 (minor amount of residual staining, small  ?                          fragments of  stool and/or opaque liquid, but mucosa  ?                          seen well), Transverse Colon = 3 (entire mucosa  ?                          seen well with no residual staining, small  ?                          fragments of stool or opaque liquid) and Left Colon  ?                          = 2 (minor amount of residual staining, small  ?                          fragments of stool and/or opaque liquid, but mucosa  ?                          seen well). The total BBPS score equals 7. The  ?                          quality of the bowel preparation was good. The  ?                          ileocecal valve, appendiceal orifice, and rectum  ?                          were photographed. ?Scope In: 12:20:12 PM ?Scope Out: 12:43:50 PM ?Scope Withdrawal Time: 0 hours 19 minutes 28 seconds  ?Total Procedure Duration: 0 hours 23 minutes 38 seconds  ?Findings: ?     A 3 mm polyp was found in the sigmoid colon. The polyp was sessile. The  ?     polyp was removed with a cold snare. Resection and retrieval were  ?      complete. ?     Three large patchy angiodysplastic lesions without bleeding were found  ?     in the ascending colon and in the cecum. Coagulation for tissue  ?     destruction using monopolar probe was successful. To prevent bleeding  ?     post-intervention, four hemostatic clips were successfully placed. There  ?     was no bleeding at the end of the procedure. ?     Scattered small and large-mouthed diverticula were found in the entire  ?     colon. ?     There was evidence of a prior end-to-side colo-colonic anastomosis in  ?     the sigmoid colon. This was patent and was characterized by healthy  ?     appearing mucosa. The anastomosis was traversed. ?     In the cecum a very large AVM was found and it was ablated with APC.  ?     Four hemoclips were deployed to minimize the risk of bleeding. A couple  ?     of other medium/large AVMs were ablated with APC. Stool mixed with blood  ?     was found and it was not clear if this  bleeding was diverticular or from  ?     the AVMs. The anastomosis was intacted at 15 cm and there was a  ?     pandiverticulosis. ?Impression:               - One 3 mm polyp in the sigmoid colon, removed with  ?                          a cold snare. Resected and retrieved. ?                          - Three non-bleeding colonic angiodysplastic  ?                          lesions. Treated with a monopolar probe. Clips were  ?                          placed. ?                          - Diverticulosis in the entire examined colon. ?                          - Patent end-to-side colo-colonic anastomosis,  ?                          characterized by healthy appearing mucosa. ?Moderate Sedation: ?     Not Applicable - Patient had care per Anesthesia. ?Recommendation:           - Return patient to hospital ward for ongoing care. ?                          - Resume regular diet. ?                          - Continue present medications. ?                          - Await pathology results. ?                           - Return to GI clinic in 2 weeks. ?                          - Okay to resume Eliquis. ?Procedure Code(s):        --- Professional --- ?                          [phone], Colonoscopy, flexible; with ablation of  ?                          tumor(s), polyp(s), or other lesion(s) (includes  ?                          pre- and post-dilation and guide wire passage, when  ?  performed) ?                          94765, 59, Colonoscopy, flexible; with removal of  ?                          tumor(s), polyp(s), or other lesion(s) by snare  ?                          technique ?Diagnosis Code(s):        --- Professional --- ?                          K63.5, Polyp of colon ?                          K55.20, Angiodysplasia of colon without hemorrhage ?                          Z98.0, Intestinal bypass and anastomosis status ?                          K92.1, Melena (includes Hematochezia) ?                          K57.30, Diverticulosis of large intestine without  ?                          perforation or abscess without bleeding ?CPT copyright 2019 American Medical Association. All rights reserved. ?The codes documented in this report are preliminary and upon coder review may  ?be revised to meet current compliance requirements. ?Carol Ada, MD ?Carol Ada, MD ?12/06/2021 12:59:58 PM ?This report has been signed electronically. ?Number of Addenda: 0 ?

## 2021-12-07 DIAGNOSIS — D72819 Decreased white blood cell count, unspecified: Secondary | ICD-10-CM | POA: Diagnosis not present

## 2021-12-07 DIAGNOSIS — G4733 Obstructive sleep apnea (adult) (pediatric): Secondary | ICD-10-CM | POA: Diagnosis not present

## 2021-12-07 DIAGNOSIS — I10 Essential (primary) hypertension: Secondary | ICD-10-CM | POA: Diagnosis not present

## 2021-12-07 DIAGNOSIS — N400 Enlarged prostate without lower urinary tract symptoms: Secondary | ICD-10-CM | POA: Diagnosis not present

## 2021-12-07 DIAGNOSIS — I4821 Permanent atrial fibrillation: Secondary | ICD-10-CM | POA: Diagnosis not present

## 2021-12-07 DIAGNOSIS — D62 Acute posthemorrhagic anemia: Secondary | ICD-10-CM | POA: Diagnosis not present

## 2021-12-07 DIAGNOSIS — J449 Chronic obstructive pulmonary disease, unspecified: Secondary | ICD-10-CM | POA: Diagnosis not present

## 2021-12-07 DIAGNOSIS — K922 Gastrointestinal hemorrhage, unspecified: Secondary | ICD-10-CM | POA: Diagnosis not present

## 2021-12-07 DIAGNOSIS — J9611 Chronic respiratory failure with hypoxia: Secondary | ICD-10-CM | POA: Diagnosis not present

## 2021-12-07 DIAGNOSIS — E43 Unspecified severe protein-calorie malnutrition: Secondary | ICD-10-CM | POA: Diagnosis not present

## 2021-12-07 DIAGNOSIS — E119 Type 2 diabetes mellitus without complications: Secondary | ICD-10-CM | POA: Diagnosis not present

## 2021-12-07 DIAGNOSIS — I5032 Chronic diastolic (congestive) heart failure: Secondary | ICD-10-CM | POA: Diagnosis not present

## 2021-12-07 LAB — GLUCOSE, CAPILLARY
Glucose-Capillary: 148 mg/dL — ABNORMAL HIGH (ref 70–99)
Glucose-Capillary: 105 mg/dL — ABNORMAL HIGH (ref 70–99)
Glucose-Capillary: 134 mg/dL — ABNORMAL HIGH (ref 70–99)
Glucose-Capillary: 166 mg/dL — ABNORMAL HIGH (ref 70–99)

## 2021-12-07 LAB — CBC WITH DIFFERENTIAL/PLATELET
Abs Immature Granulocytes: 0.03 10*3/uL (ref 0.00–0.07)
Basophils Absolute: 0 10*3/uL (ref 0.0–0.1)
Basophils Relative: 1 %
Eosinophils Absolute: 0 10*3/uL (ref 0.0–0.5)
Eosinophils Relative: 1 %
HCT: 30.3 % — ABNORMAL LOW (ref 39.0–52.0)
Hemoglobin: 9.1 g/dL — ABNORMAL LOW (ref 13.0–17.0)
Immature Granulocytes: 1 %
Lymphocytes Relative: 11 %
Lymphs Abs: 0.4 10*3/uL — ABNORMAL LOW (ref 0.7–4.0)
MCH: 31.7 pg (ref 26.0–34.0)
MCHC: 30 g/dL (ref 30.0–36.0)
MCV: 105.6 fL — ABNORMAL HIGH (ref 80.0–100.0)
Monocytes Absolute: 0.4 10*3/uL (ref 0.1–1.0)
Monocytes Relative: 9 %
Neutro Abs: 3.1 10*3/uL (ref 1.7–7.7)
Neutrophils Relative %: 77 %
Platelets: 138 10*3/uL — ABNORMAL LOW (ref 150–400)
RBC: 2.87 MIL/uL — ABNORMAL LOW (ref 4.22–5.81)
RDW: 15.8 % — ABNORMAL HIGH (ref 11.5–15.5)
WBC: 4 10*3/uL (ref 4.0–10.5)
nRBC: 0 % (ref 0.0–0.2)

## 2021-12-07 LAB — ZINC: Zinc: 75 ug/dL (ref 44–115)

## 2021-12-07 LAB — BASIC METABOLIC PANEL
Anion gap: 3 — ABNORMAL LOW (ref 5–15)
BUN: 9 mg/dL (ref 8–23)
CO2: 40 mmol/L — ABNORMAL HIGH (ref 22–32)
Calcium: 8.5 mg/dL — ABNORMAL LOW (ref 8.9–10.3)
Chloride: 100 mmol/L (ref 98–111)
Creatinine, Ser: 1.16 mg/dL (ref 0.61–1.24)
GFR, Estimated: >60 mL/min (ref >60)
Glucose, Bld: 152 mg/dL — ABNORMAL HIGH (ref 70–99)
Potassium: 4.6 mmol/L (ref 3.5–5.1)
Sodium: 143 mmol/L (ref 135–145)

## 2021-12-07 LAB — MAGNESIUM: Magnesium: 1.9 mg/dL (ref 1.7–2.4)

## 2021-12-07 LAB — SURGICAL PATHOLOGY

## 2021-12-07 MED ORDER — MOMETASONE FURO-FORMOTEROL FUM 200-5 MCG/ACT IN AERO
2.0000 | INHALATION_SPRAY | Freq: Two times a day (BID) | RESPIRATORY_TRACT | Status: DC
  Administered 2021-12-07 – 2021-12-09 (×4): 2 via RESPIRATORY_TRACT
  Filled 2021-12-07: qty 8.8

## 2021-12-07 MED ORDER — IPRATROPIUM-ALBUTEROL 0.5-2.5 (3) MG/3ML IN SOLN
3.0000 mL | Freq: Three times a day (TID) | RESPIRATORY_TRACT | Status: DC
  Administered 2021-12-08 – 2021-12-09 (×4): 3 mL via RESPIRATORY_TRACT
  Filled 2021-12-07 (×4): qty 3

## 2021-12-07 MED ORDER — PANTOPRAZOLE SODIUM 40 MG PO TBEC
40.0000 mg | DELAYED_RELEASE_TABLET | Freq: Every day | ORAL | Status: DC
  Administered 2021-12-07 – 2021-12-09 (×3): 40 mg via ORAL
  Filled 2021-12-07 (×3): qty 1

## 2021-12-07 MED ORDER — IPRATROPIUM-ALBUTEROL 0.5-2.5 (3) MG/3ML IN SOLN
3.0000 mL | Freq: Four times a day (QID) | RESPIRATORY_TRACT | Status: DC
  Administered 2021-12-07: 3 mL via RESPIRATORY_TRACT
  Filled 2021-12-07 (×2): qty 3

## 2021-12-07 NOTE — Anesthesia Postprocedure Evaluation (Signed)
Anesthesia Post Note  Patient: CHICK COUSINS  Procedure(s) Performed: COLONOSCOPY WITH PROPOFOL HOT HEMOSTASIS (ARGON PLASMA COAGULATION/BICAP) HEMOSTASIS CLIP PLACEMENT POLYPECTOMY     Patient location during evaluation: Endoscopy Anesthesia Type: MAC Level of consciousness: awake and alert Pain management: pain level controlled Vital Signs Assessment: post-procedure vital signs reviewed and stable Respiratory status: spontaneous breathing, nonlabored ventilation, respiratory function stable and patient connected to nasal cannula oxygen Cardiovascular status: stable and blood pressure returned to baseline Postop Assessment: no apparent nausea or vomiting Anesthetic complications: no   No notable events documented.  Last Vitals:  Vitals:   12/07/21 0731 12/07/21 0735  BP: 105/65   Pulse: (!) 112   Resp: 18   Temp: 37.4 C   SpO2: 99% 95%    Last Pain:  Vitals:   12/07/21 0813  TempSrc:   PainSc: 0-No pain                 Marria Mathison,W. EDMOND

## 2021-12-07 NOTE — Progress Notes (Signed)
PROGRESS NOTE    Manuel [name]  BTD:[id number] DOB: 05-28-1946 DOA: 12/04/2021 PCP: [name], DO   Brief Narrative:  76 year old male with history of COPD, chronic respiratory failure with hypoxia on 3 L oxygen via nasal cannula, OSA on CPAP, cirrhosis of liver, A-fib on Eliquis, hypertension, gastric ulcer and BPH presented with melena and admitted for upper GI bleeding/ABLA with hemoglobin of 10 (13 about a month ago) and Hemoccult positive.  He was started on IV Protonix.  GI was consulted.  EGD on 12/05/2021 showed gastritis but no significant other findings.  He had colonoscopy on 12/06/2021.  Assessment & Plan:   Acute blood loss anemia GI bleeding -Presented with melena.  Hemoglobin 10 on presentation (13 about a month ago) and Hemoccult positive -EGD on 12/05/2021 showed gastritis but no other significant findings.  Currently on IV Protonix.  Monitor H&H.  Hemoglobin 9.1 today. -Colonoscopy on 12/06/2021 showed 1 sigmoid colon polyp which was removed; 3 nonbleeding colonic angiodysplastic lesions, treated with monopolar probe and clipping; diverticulosis in the entire examined colon.  GI cleared for the Eliquis to be resumed.  Eliquis resumed on 12/06/2021. -Switch Protonix drip to Protonix 40 mg daily.  Repeat a.m. H&H.  Chronic respiratory failure with hypoxia  COPD (chronic obstructive pulmonary disease)  -Oxygen requirement has worsened overnight.  Normally on 2 L oxygen via nasal cannula at home.  Worsened to 6 L overnight and currently on 4 L oxygen.  Patient is slightly drowsy.  We will try and wean off to 3 L and monitor mental status today. -Continue Xopenex as needed  Permanent atrial fibrillation  -Rate controlled on reduced dose of home Cardizem -Eliquis resumed on 12/06/2021. -Outpatient follow-up with cardiology   Protein-calorie malnutrition, severe -Follow nutrition recommendations   OSA (obstructive sleep apnea) -Continue CPAP at night   Controlled diabetes  mellitus type 2 -A1c 6.6% about 2 months ago.  On Farxiga and metformin at home which are currently on hold.  BPH -Continue Cardura  Chronic diastolic heart failure -Currently compensated.  Lasix on hold.  Strict input and output.  Daily weights.  Fluid restriction  Hypertension -Blood pressure on the lower side.  Continue Cardizem and Cardura.  Lasix on hold  Leukopenia -Resolved  Thrombocytopenia -Mild.  Repeat a.m. labs.   DVT prophylaxis: SCDs Code Status: Full Family Communication: None at bedside Disposition Plan: Status is: Inpatient Remains inpatient appropriate because: Of severity of illness.  Possible discharge tomorrow if respiratory status and mental status improved and H&H remained stable.    Consultants: GI  Procedures: EGD on 12/05/2021 Colonoscopy on 12/06/2021 Impression:               - One 3 mm polyp in the sigmoid colon, removed with                            a cold snare. Resected and retrieved.                           - Three non-bleeding colonic angiodysplastic                            lesions. Treated with a monopolar probe. Clips were                            placed.                           -  Diverticulosis in the entire examined colon.                           - Patent end-to-side colo-colonic anastomosis,                            characterized by healthy appearing mucosa. Moderate Sedation:      Not Applicable - Patient had care per Anesthesia. Recommendation:           - Return patient to hospital ward for ongoing care.                           - Resume regular diet.                           - Continue present medications.                           - Await pathology results.                           - Return to GI clinic in 2 weeks.                           - Okay to resume Eliquis. Antimicrobials: None   Subjective: Patient seen and examined at bedside.  As per nursing staff, patient required more oxygen overnight.  No  fever, seizures, black or bloody stools reported. Objective: Vitals:   12/07/21 0632 12/07/21 0731 12/07/21 0735 12/07/21 1045  BP:  105/65  104/60  Pulse:  (!) 112  63  Resp:  18  18  Temp:  99.3 F (37.4 C)  99.3 F (37.4 C)  TempSrc:  Oral  Oral  SpO2: 95% 99% 95% 97%  Weight:      Height:        Intake/Output Summary (Last 24 hours) at 12/07/2021 1117 Last data filed at 12/07/2021 0500 Gross per 24 hour  Intake 395.33 ml  Output --  Net 395.33 ml    Filed Weights   12/04/21 1104 12/05/21 1352  Weight: 81.3 kg 81.3 kg    Examination:  General: On 4 L oxygen via nasal cannula.  No distress.  Looks chronically ill and deconditioned. ENT/neck: No thyromegaly.  JVD is not elevated  respiratory: Decreased breath sounds at bases bilaterally with some crackles; no wheezing  CVS: Tachycardic intermittently; S1-S2 heard  abdominal: Soft, nontender, slightly distended; no organomegaly, normal bowel sounds are heard Extremities: Trace lower extremity edema; no cyanosis  CNS: Sleepy, wakes up slightly, slow to respond, poor historian.  No focal neurologic deficit.  Moves extremities Lymph: No obvious lymphadenopathy Skin: No obvious ecchymosis/lesions  psych: Affect is mostly flat.  Does not participate in conversation much. musculoskeletal: No obvious joint swelling/deformity    Data Reviewed: I have personally reviewed following labs and imaging studies  CBC: Recent Labs  Lab 12/04/21 1118 12/04/21 1757 12/05/21 0504 12/05/21 1748 12/06/21 0132 12/06/21 0912 12/07/21 0508  WBC 3.8*  --  2.9*  --  2.3*  --  4.0  NEUTROABS 3.3  --   --   --   --   --  3.1  HGB 10.0*   < > 8.8* 8.8* 8.8* 8.7* 9.1*  HCT 33.0*   < > 29.6* 28.9* 29.8* 29.4* 30.3*  MCV 98.8  --  103.5*  --  102.8*  --  105.6*  PLT 157  --  155  --  126*  --  138*   < > = values in this interval not displayed.    Basic Metabolic Panel: Recent Labs  Lab 12/04/21 1118 12/05/21 0504 12/06/21 0132  12/07/21 0508  NA 142 144 142 143  K 4.4 4.1 3.7 4.6  CL 101 102 102 100  CO2 35* 37* 36* 40*  GLUCOSE 125* 93 81 152*  BUN 28* _0 CREATININE 1.26* 1.09 1.17 1.16  CALCIUM 9.0 8.4* 8.3* 8.5*  MG  --   --  1.8 1.9  PHOS  --   --  3.1  --     GFR: Estimated Creatinine Clearance: 62.3 mL/min (by C-G formula based on SCr of 1.16 mg/dL). Liver Function Tests: Recent Labs  Lab 12/04/21 1118 12/05/21 0504 12/06/21 0132  AST 10* 10*  --   ALT 7 9  --   ALKPHOS 57 49  --   BILITOT 0.8 0.9  --   PROT 6.0* 5.0*  --   ALBUMIN 3.5 2.5* 2.5*    No results for input(s): LIPASE, AMYLASE in the last 168 hours. No results for input(s): AMMONIA in the last 168 hours. Coagulation Profile: No results for input(s): INR, PROTIME in the last 168 hours. Cardiac Enzymes: No results for input(s): CKTOTAL, CKMB, CKMBINDEX, TROPONINI in the last 168 hours. BNP (last 3 results) No results for input(s): PROBNP in the last 8760 hours. HbA1C: No results for input(s): HGBA1C in the last 72 hours. CBG: Recent Labs  Lab 12/06/21 0740 12/06/21 1347 12/06/21 1630 12/06/21 2232 12/07/21 0725  GLUCAP 109* 90 159* 128* 148*    Lipid Profile: No results for input(s): CHOL, HDL, LDLCALC, TRIG, CHOLHDL, LDLDIRECT in the last 72 hours. Thyroid Function Tests: No results for input(s): TSH, T4TOTAL, FREET4, T3FREE, THYROIDAB in the last 72 hours. Anemia Panel: Recent Labs    12/06/21 0132  VITAMINB12 2,414*  FOLATE 10.6  FERRITIN 103  TIBC 199*  IRON 38*  RETICCTPCT 2.4    Sepsis Labs: No results for input(s): PROCALCITON, LATICACIDVEN in the last 168 hours.  No results found for this or any previous visit (from the past 240 hour(s)).       Radiology Studies: No results found.      Scheduled Meds:  apixaban  5 mg Oral BID   diltiazem  120 mg Oral Daily   doxazosin  4 mg Oral QHS   escitalopram  5 mg Oral Daily   feeding supplement  237 mL Oral BID BM   insulin aspart   0-9 Units Subcutaneous TID WC   multivitamin with minerals  1 tablet Oral Daily   [START ON 12/08/2021] pantoprazole  40 mg Intravenous Q12H   Continuous Infusions:  pantoprazole 8 mg/hr (12/07/21 0330)          Manuel August, MD [facility] 12/07/2021, 11:17 AM

## 2021-12-07 NOTE — Progress Notes (Signed)
Pt reports SOB. Rn assessed pt - pt laying down in bed, lungs: expiratory wheezing with O2 at 2L [place], O2 sat 66%. Pt able to communicate with RN during event, alert and oriented x4.  PT sat up with HOB to 53degrees, O2 increased to 5L (slow improvement with O2 sat level).   Respiratory therapist Lawerance Bach , charge RN Lauren, Rapid response RN [place], on call APP Olena Heckle notified and provided assist with pt at bedside   Pt encouraged to cough  and deep breathe slowly to clear secretions. Pt stated that this helped and he felt okay.   Incentive Spirometer given, pt educated on how to use device --- more education is needed. Exp wheezes decreased. O2 sat level at 80-90% during education and teachback due to improper use. Goal on IS at 500. Pt was able to inhale to 250.   Flutter valve given and pt educated on how to use device --- pt had better improvement with breathing and O2 sat levels with this device -- no exp wheezing noted, O2 level at 95%.   Will continue to monitor pt and address pt needs throughout shift PRN

## 2021-12-07 NOTE — Progress Notes (Signed)
Physical Therapy Treatment Patient Details Name: Manuel [name] MRN: [id number] DOB: March 20, 1946 Today's Date: 12/07/2021   History of Present Illness Patient is a 76 year old male who presented to the hospital with melena. patient was admited with ABLA with possible GI bleed. patient was noted to have been recently hospitalized 4/21 with COPD exacerbation and CHF.   PMH: OSA on CPAP nightly, hypertension, hyperlipidemia, type 2 diabetes, COPD, chronic hypoxia on 2 L nasal cannula at baseline, former tobacco user, AAA, history of GI bleed, cirrhosis of liver, CKD, BPH, history of nonbleeding gastric ulcer, acute on chronic CHF    PT Comments    General Comments: AxO x 3 Pastor feeling VERY "tired" and c/o "no sleep last night". Pt required Max encouragement to participate.  General bed mobility comments: pt self able with increased time but feeling very "winded".  Pt on 3 lts oxygen at home but currently on 4 lts today.  General transfer comment: self able to rise from bed but MAX c/o feeling "tired".  General Gait Details: decreased amb distance this session and used walker due to increased c/o weakness, fatgue and dyspnea. Pt started on 4 lts nasal at rest 92% but drastically decreased to 62% during activity.  Returned pt to bed and increased to 6 lts with a very slow return and struggle just to achieve sats 88% even 7 mins after amb.  Reported to RN.  Encouraged use of Flutter and coughing.  Pt with audible upper airway gurggle with difficulty clearing.  RN stated she would call Respiratory and left pt on 6 lts nasal.  Pt plans to D/C to home with Spouse.    Recommendations for follow up therapy are one component of a multi-disciplinary discharge planning process, led by the attending physician.  Recommendations may be updated based on patient status, additional functional criteria and insurance authorization.  Follow Up Recommendations  No PT follow up     Assistance Recommended at Discharge  PRN  Patient can return home with the following Assistance with cooking/housework;Assist for transportation   Equipment Recommendations  None recommended by PT    Recommendations for Other Services       Precautions / Restrictions Precautions Precautions: Fall Precaution Comments: Monitor O2 and HR Restrictions Weight Bearing Restrictions: No     Mobility  Bed Mobility Overal bed mobility: Needs Assistance Bed Mobility: Supine to Sit, Sit to Supine     Supine to sit: Supervision, Min guard Sit to supine: Min guard   General bed mobility comments: pt self able with increased time but feeling very "winded"    Transfers Overall transfer level: Needs assistance Equipment used: None               General transfer comment: self able to rise from bed but MAX c/o feeling "tired"    Ambulation/Gait Ambulation/Gait assistance: Min assist Gait Distance (Feet): 45 Feet Assistive device: Rolling walker (2 wheels) Gait Pattern/deviations: Step-through pattern, Decreased stride length Gait velocity: decreased     General Gait Details: decreased amb distance this session and used walker due to increased c/o weakness, fatgue and dyspnea. Pt started on 4 lts nasal at rest 92% but drastically decreased to 62% during activity.  Returned pt to bed and increased to 6 lts with a very slow return and struggle just to achieve sats 88% even 7 mins after amb.  Reported to RN.  Encouraged use of Flutter and coughing.  Pt with audible upper airway gurggle with difficulty clearing.  RN stated she would call Respiratory and left pt on 6 lts nasal.   Stairs             Wheelchair Mobility    Modified Rankin (Stroke Patients Only)       Balance                                            Cognition Arousal/Alertness: Awake/alert Behavior During Therapy: WFL for tasks assessed/performed Overall Cognitive Status: Within Functional Limits for tasks assessed                                  General Comments: AxO x 3 Pastor feeling VERY "tired" and c/o "no sleep last night".        Exercises      General Comments        Pertinent Vitals/Pain Pain Assessment Pain Assessment: No/denies pain    Home Living                          Prior Function            PT Goals (current goals can now be found in the care plan section) Progress towards PT goals: Progressing toward goals    Frequency    Min 3X/week      PT Plan Current plan remains appropriate    Co-evaluation              AM-PAC PT "6 Clicks" Mobility   Outcome Measure  Help needed turning from your back to your side while in a flat bed without using bedrails?: A Little Help needed moving from lying on your back to sitting on the side of a flat bed without using bedrails?: A Little Help needed moving to and from a bed to a chair (including a wheelchair)?: A Little Help needed standing up from a chair using your arms (e.g., wheelchair or bedside chair)?: A Little Help needed to walk in hospital room?: A Little Help needed climbing 3-5 steps with a railing? : A Little 6 Click Score: 18    End of Session Equipment Utilized During Treatment: Oxygen;Gait belt Activity Tolerance: Patient tolerated treatment well Patient left: in bed;with call bell/phone within reach;with bed alarm set;with family/visitor present Nurse Communication: Mobility status PT Visit Diagnosis: Unsteadiness on feet (R26.81)     Time: 1130-1156 PT Time Calculation (min) (ACUTE ONLY): 26 min  Charges:  $Gait Training: 8-22 mins $Therapeutic Activity: 8-22 mins                     {Braxton Vantrease  PTA Acute  Rehabilitation Services Pager      [phone] Office      [phone]

## 2021-12-08 DIAGNOSIS — J9611 Chronic respiratory failure with hypoxia: Secondary | ICD-10-CM | POA: Diagnosis not present

## 2021-12-08 DIAGNOSIS — I4821 Permanent atrial fibrillation: Secondary | ICD-10-CM | POA: Diagnosis not present

## 2021-12-08 DIAGNOSIS — D62 Acute posthemorrhagic anemia: Secondary | ICD-10-CM | POA: Diagnosis not present

## 2021-12-08 DIAGNOSIS — I5032 Chronic diastolic (congestive) heart failure: Secondary | ICD-10-CM | POA: Diagnosis not present

## 2021-12-08 DIAGNOSIS — K922 Gastrointestinal hemorrhage, unspecified: Secondary | ICD-10-CM | POA: Diagnosis not present

## 2021-12-08 LAB — BASIC METABOLIC PANEL
Anion gap: 2 — ABNORMAL LOW (ref 5–15)
BUN: 8 mg/dL (ref 8–23)
CO2: 39 mmol/L — ABNORMAL HIGH (ref 22–32)
Calcium: 8.7 mg/dL — ABNORMAL LOW (ref 8.9–10.3)
Chloride: 98 mmol/L (ref 98–111)
Creatinine, Ser: 1.1 mg/dL (ref 0.61–1.24)
GFR, Estimated: >60 mL/min (ref >60)
Glucose, Bld: 158 mg/dL — ABNORMAL HIGH (ref 70–99)
Potassium: 4.4 mmol/L (ref 3.5–5.1)
Sodium: 139 mmol/L (ref 135–145)

## 2021-12-08 LAB — CBC WITH DIFFERENTIAL/PLATELET
Abs Immature Granulocytes: 0.02 10*3/uL (ref 0.00–0.07)
Basophils Absolute: 0 10*3/uL (ref 0.0–0.1)
Basophils Relative: 0 %
Eosinophils Absolute: 0 10*3/uL (ref 0.0–0.5)
Eosinophils Relative: 1 %
HCT: 29.1 % — ABNORMAL LOW (ref 39.0–52.0)
Hemoglobin: 8.5 g/dL — ABNORMAL LOW (ref 13.0–17.0)
Immature Granulocytes: 1 %
Lymphocytes Relative: 9 %
Lymphs Abs: 0.3 10*3/uL — ABNORMAL LOW (ref 0.7–4.0)
MCH: 31 pg (ref 26.0–34.0)
MCHC: 29.2 g/dL — ABNORMAL LOW (ref 30.0–36.0)
MCV: 106.2 fL — ABNORMAL HIGH (ref 80.0–100.0)
Monocytes Absolute: 0.3 10*3/uL (ref 0.1–1.0)
Monocytes Relative: 10 %
Neutro Abs: 2.7 10*3/uL (ref 1.7–7.7)
Neutrophils Relative %: 79 %
Platelets: 137 10*3/uL — ABNORMAL LOW (ref 150–400)
RBC: 2.74 MIL/uL — ABNORMAL LOW (ref 4.22–5.81)
RDW: 15.6 % — ABNORMAL HIGH (ref 11.5–15.5)
WBC: 3.4 10*3/uL — ABNORMAL LOW (ref 4.0–10.5)
nRBC: 0 % (ref 0.0–0.2)

## 2021-12-08 LAB — GLUCOSE, CAPILLARY
Glucose-Capillary: 147 mg/dL — ABNORMAL HIGH (ref 70–99)
Glucose-Capillary: 165 mg/dL — ABNORMAL HIGH (ref 70–99)
Glucose-Capillary: 187 mg/dL — ABNORMAL HIGH (ref 70–99)
Glucose-Capillary: 126 mg/dL — ABNORMAL HIGH (ref 70–99)

## 2021-12-08 LAB — MAGNESIUM: Magnesium: 1.9 mg/dL (ref 1.7–2.4)

## 2021-12-08 MED ORDER — FUROSEMIDE 10 MG/ML IJ SOLN
20.0000 mg | INTRAMUSCULAR | Status: AC
Start: 2021-12-08 — End: 2021-12-08
  Administered 2021-12-08: 20 mg via INTRAVENOUS
  Filled 2021-12-08: qty 2

## 2021-12-08 MED ORDER — FUROSEMIDE 40 MG PO TABS
40.0000 mg | ORAL_TABLET | Freq: Every morning | ORAL | Status: DC
  Administered 2021-12-09: 40 mg via ORAL
  Filled 2021-12-08: qty 1

## 2021-12-08 NOTE — Care Management Important Message (Signed)
Important Message  Patient Details IM Letter placed in Patients room. Name: Manuel [name] MRN: [id number] Date of Birth: 06-08-1946   Medicare Important Message Given:  Yes     Kerin Salen 12/08/2021, 1:13 PM

## 2021-12-08 NOTE — Progress Notes (Signed)
PROGRESS NOTE    Manuel [name]  CZY:[id number] DOB: [birth year] DOA: 12/04/2021 PCP: [name], DO   Brief Narrative:  76 year old male with history of COPD, chronic respiratory failure with hypoxia on 3 L oxygen via nasal cannula, OSA on CPAP, cirrhosis of liver, A-fib on Eliquis, hypertension, gastric ulcer and BPH presented with melena and admitted for upper GI bleeding/ABLA with hemoglobin of 10 (13 about a month ago) and Hemoccult positive.  He was started on IV Protonix.  GI was consulted.  EGD on 12/05/2021 showed gastritis but no significant other findings.  He had colonoscopy on 12/06/2021. Subsequently Eliquis was started.  Subsequently, his oxygen requirement worsened.  Assessment & Plan:   Acute blood loss anemia GI bleeding -Presented with melena.  Hemoglobin 10 on presentation (13 about a month ago) and Hemoccult positive -EGD on 12/05/2021 showed gastritis but no other significant findings.  Monitor H&H.  Hemoglobin 8.5 today. -Colonoscopy on 12/06/2021 showed 1 sigmoid colon polyp which was removed; 3 nonbleeding colonic angiodysplastic lesions, treated with monopolar probe and clipping; diverticulosis in the entire examined colon.  GI cleared for the Eliquis to be resumed.  Eliquis resumed on 12/06/2021. - Continue Protonix 40 mg daily.  Repeat a.m. H&H.  Chronic respiratory failure with hypoxia  COPD (chronic obstructive pulmonary disease)  -Normally on 2 L oxygen via nasal cannula at home.  Worsened to 6 L and currently on 4 L oxygen.  Still short of breath with exertion.  Will give 1 dose of IV Lasix today.  We will try and wean off to 3 L  -Continue Xopenex as needed  Permanent atrial fibrillation  -Continue Cardizem.  Currently tachycardic.  Will resume home dose of Cardizem on discharge. -Eliquis resumed on 12/06/2021. -Outpatient follow-up with cardiology   Protein-calorie malnutrition, severe -Follow nutrition recommendations   OSA (obstructive sleep  apnea) -Continue CPAP at night   Controlled diabetes mellitus type 2 -A1c 6.6% about 2 months ago.  On Farxiga and metformin at home which are currently on hold.  BPH -Continue Cardura  Chronic diastolic heart failure -Currently compensated.  Lasix plan as above.  Strict input and output.  Daily weights.  Fluid restriction  Hypertension -Blood pressure improving.  Continue Cardizem and Cardura.  Lasix plan as above.  Leukopenia -Resolved  Thrombocytopenia -Mild.  Repeat a.m. labs.   DVT prophylaxis: SCDs Code Status: Full Family Communication: Wife at bedside Disposition Plan: Status is: Inpatient Remains inpatient appropriate because: Of severity of illness.  Possible discharge tomorrow if respiratory status improves   Consultants: GI  Procedures: EGD on 12/05/2021 Colonoscopy on 12/06/2021 Impression:               - One 3 mm polyp in the sigmoid colon, removed with                            a cold snare. Resected and retrieved.                           - Three non-bleeding colonic angiodysplastic                            lesions. Treated with a monopolar probe. Clips were                            placed.                           -  Diverticulosis in the entire examined colon.                           - Patent end-to-side colo-colonic anastomosis,                            characterized by healthy appearing mucosa. Moderate Sedation:      Not Applicable - Patient had care per Anesthesia. Recommendation:           - Return patient to hospital ward for ongoing care.                           - Resume regular diet.                           - Continue present medications.                           - Await pathology results.                           - Return to GI clinic in 2 weeks.                           - Okay to resume Eliquis. Antimicrobials: None   Subjective: Patient seen and examined at bedside.  More awake this morning and feels slightly better  but still short of breath with exertion.  No fever, vomiting, black or bloody stools reported. Objective: Vitals:   12/08/21 0918 12/08/21 0923 12/08/21 1134 12/08/21 1240  BP:    (!) 99/59  Pulse:    98  Resp: (!) 31 (!) 25  (!) 30  Temp:    98.2 F (36.8 C)  TempSrc:    Oral  SpO2: 96% 96% 93% 91%  Weight:      Height:        Intake/Output Summary (Last 24 hours) at 12/08/2021 1247 Last data filed at 12/08/2021 0910 Gross per 24 hour  Intake 302.15 ml  Output 550 ml  Net -247.85 ml    Filed Weights   12/04/21 1104 12/05/21 1352 12/08/21 0355  Weight: 81.3 kg 81.3 kg 77.8 kg    Examination:  General: No acute distress, currently on 4 L oxygen via nasal cannula.  Chronically ill and deconditioned looking.   ENT/neck: No elevated JVD.  No obvious masses  respiratory: Bilateral decreased breath sounds at bases with some scattered basilar crackles; tachypneic CVS: S1-S2 are heard; tachycardic Abdominal: Soft, nontender, distended mildly, no organomegaly, bowel sounds heard Extremities: Mild lower extremity edema present; no clubbing CNS: More awake, still slow to respond, poor historian.  No focal neurologic deficit.  Moving extremities. Lymph: No cervical lymphadenopathy Skin: No rashes, lesions, ulcers Psych: Flat affect.  No signs of agitation.   Musculoskeletal: No obvious joint deformity/tenderness/swelling     Data Reviewed: I have personally reviewed following labs and imaging studies  CBC: Recent Labs  Lab 12/04/21 1118 12/04/21 1757 12/05/21 0504 12/05/21 1748 12/06/21 0132 12/06/21 0912 12/07/21 0508 12/08/21 0501  WBC 3.8*  --  2.9*  --  2.3*  --  4.0 3.4*  NEUTROABS 3.3  --   --   --   --   --  3.1  2.7  HGB 10.0*   < > 8.8* 8.8* 8.8* 8.7* 9.1* 8.5*  HCT 33.0*   < > 29.6* 28.9* 29.8* 29.4* 30.3* 29.1*  MCV 98.8  --  103.5*  --  102.8*  --  105.6* 106.2*  PLT 157  --  155  --  126*  --  138* 137*   < > = values in this interval not displayed.     Basic Metabolic Panel: Recent Labs  Lab 12/04/21 1118 12/05/21 0504 12/06/21 0132 12/07/21 0508 12/08/21 0501  NA 142 144 142 143 139  K 4.4 4.1 3.7 4.6 4.4  CL 101 102 102 100 98  CO2 35* 37* 36* 40* 39*  GLUCOSE 125* 93 81 152* 158*  BUN 28* _0 CREATININE 1.26* 1.09 1.17 1.16 1.10  CALCIUM 9.0 8.4* 8.3* 8.5* 8.7*  MG  --   --  1.8 1.9 1.9  PHOS  --   --  3.1  --   --     GFR: Estimated Creatinine Clearance: 62.9 mL/min (by C-G formula based on SCr of 1.1 mg/dL). Liver Function Tests: Recent Labs  Lab 12/04/21 1118 12/05/21 0504 12/06/21 0132  AST 10* 10*  --   ALT 7 9  --   ALKPHOS 57 49  --   BILITOT 0.8 0.9  --   PROT 6.0* 5.0*  --   ALBUMIN 3.5 2.5* 2.5*    No results for input(s): LIPASE, AMYLASE in the last 168 hours. No results for input(s): AMMONIA in the last 168 hours. Coagulation Profile: No results for input(s): INR, PROTIME in the last 168 hours. Cardiac Enzymes: No results for input(s): CKTOTAL, CKMB, CKMBINDEX, TROPONINI in the last 168 hours. BNP (last 3 results) No results for input(s): PROBNP in the last 8760 hours. HbA1C: No results for input(s): HGBA1C in the last 72 hours. CBG: Recent Labs  Lab 12/07/21 1132 12/07/21 1703 12/07/21 2110 12/08/21 0734 12/08/21 1204  GLUCAP 105* 134* 166* 147* 165*    Lipid Profile: No results for input(s): CHOL, HDL, LDLCALC, TRIG, CHOLHDL, LDLDIRECT in the last 72 hours. Thyroid Function Tests: No results for input(s): TSH, T4TOTAL, FREET4, T3FREE, THYROIDAB in the last 72 hours. Anemia Panel: Recent Labs    12/06/21 0132  VITAMINB12 2,414*  FOLATE 10.6  FERRITIN 103  TIBC 199*  IRON 38*  RETICCTPCT 2.4    Sepsis Labs: No results for input(s): PROCALCITON, LATICACIDVEN in the last 168 hours.  No results found for this or any previous visit (from the past 240 hour(s)).       Radiology Studies: No results found.      Scheduled Meds:  apixaban  5 mg Oral BID    diltiazem  120 mg Oral Daily   doxazosin  4 mg Oral QHS   escitalopram  5 mg Oral Daily   feeding supplement  237 mL Oral BID BM   [START ON 12/09/2021] furosemide  40 mg Oral q AM   insulin aspart  0-9 Units Subcutaneous TID WC   ipratropium-albuterol  3 mL Nebulization TID   mometasone-formoterol  2 puff Inhalation BID   multivitamin with minerals  1 tablet Oral Daily   pantoprazole  40 mg Oral Daily   Continuous Infusions:          Aline August, MD [facility] 12/08/2021, 12:47 PM

## 2021-12-08 NOTE — Discharge Summary (Signed)
Physician Discharge Summary  Manuel [name] KHV:[id number] DOB: [birth year] DOA: 12/04/2021  PCP: Manuel Morning, DO  Admit date: 12/04/2021 Discharge date: 12/09/2021  Admitted From: Home Disposition: Home  Recommendations for Outpatient Follow-up:  Follow up with PCP in 1 week with repeat CBC/BMP Outpatient follow-up with GI/Dr. Benson Norway Outpatient follow-up with pulmonary and cardiology Follow up in ED if symptoms worsen or new appear   Home Health: No Equipment/Devices: None  Discharge Condition: Stable CODE STATUS: Full Diet recommendation: Heart healthy/carb modified/fluid restriction of up to 1500 cc a day  Brief/Interim Summary: 76 year old male with history of COPD, chronic respiratory failure with hypoxia on 3 L oxygen via nasal cannula, OSA on CPAP, cirrhosis of liver, A-fib on Eliquis, hypertension, gastric ulcer and BPH presented with melena and admitted for upper GI bleeding/ABLA with hemoglobin of 10 (13 about a month ago) and Hemoccult positive.  He was started on IV Protonix.  GI was consulted.  EGD on 12/05/2021 showed gastritis but no significant other findings.  He had colonoscopy on 12/06/2021.  Subsequently Eliquis was started.  Subsequently, his oxygen requirement briefly worsened but has improved.  He is tolerating diet.  Hemoglobin is stable.  He will be discharged home with close outpatient follow-up with PCP/GI.  Will need pulmonary follow-up as well.  Discharge Diagnoses:   Acute blood loss anemia GI bleeding -Presented with melena.  Hemoglobin 10 on presentation (13 about a month ago) and Hemoccult positive -EGD on 12/05/2021 showed gastritis but no other significant findings.    Continue Protonix 40 mg daily.  Hemoglobin 8.4 today. -Colonoscopy on 12/06/2021 showed 1 sigmoid colon polyp which was removed; 3 nonbleeding colonic angiodysplastic lesions, treated with monopolar probe and clipping; diverticulosis in the entire examined colon.  GI cleared for the Eliquis  to be resumed.  Eliquis resumed on 12/06/2021. -Outpatient follow-up with GI/Dr. Benson Norway.  GI has cleared the patient for discharge.  Discharge patient home today  -Follow CBC as an outpatient within a week  chronic respiratory failure with hypoxia  COPD (chronic obstructive pulmonary disease)  -Oxygen requirement worsened after colonoscopy.  Normally on 3 L oxygen via nasal cannula at home.  Worsened to 6 L but subsequently has improved to 3-4 L.  Continue current home regimen.  Outpatient follow-up with pulmonary   permanent atrial fibrillation  -Continue home dose of Cardizem -Eliquis resumed on 12/06/2021. -Outpatient follow-up with cardiology   Protein-calorie malnutrition, severe -Follow nutrition recommendations   OSA (obstructive sleep apnea) -Continue CPAP at night   Controlled diabetes mellitus type 2 -A1c 6.6% about 2 months ago.  Carb modified diet.  Resume home regimen.  BPH -Continue Cardura   Chronic diastolic heart failure -Currently compensated.  Resume home regimen.  Outpatient follow-up with cardiology.  Continue fluid and diet restriction.  Hypertension -Blood pressure improving.  Continue Cardizem and Cardura.  Lasix will be resumed on discharge.  Leukopenia -Resolved  Thrombocytopenia -Mild.  Repeat a.m. labs.   Discharge Instructions   Allergies as of 12/09/2021       Reactions   Sulfa Antibiotics Shortness Of Breath   Headaches    Colchicine    Other reaction(s): diarrhea   Labetalol Hcl    Other reaction(s): dysuria   Tramadol Hcl    Other reaction(s): nausea        Medication List     STOP taking these medications    predniSONE 10 MG tablet Commonly known as: DELTASONE       TAKE these medications  acetaminophen 500 MG tablet Commonly known as: TYLENOL Take 500 mg by mouth every 6 (six) hours as needed for moderate pain.   albuterol 108 (90 Base) MCG/ACT inhaler Commonly known as: Ventolin HFA Inhale 2 puffs into the  lungs every 4 (four) hours as needed for wheezing or shortness of breath.   albuterol (2.5 MG/3ML) 0.083% nebulizer solution Commonly known as: PROVENTIL Take 3 mLs (2.5 mg total) by nebulization every 4 (four) hours as needed for wheezing or shortness of breath.   apixaban 5 MG Tabs tablet Commonly known as: Eliquis Take 1 tablet (5 mg total) by mouth 2 (two) times daily.   D3-50 1.25 MG (50000 UT) capsule Generic drug: Cholecalciferol Take 50,000 Units by mouth every Wednesday.   diltiazem 240 MG 24 hr capsule Commonly known as: CARDIZEM CD Take 1 capsule (240 mg total) by mouth daily.   doxazosin 4 MG tablet Commonly known as: CARDURA Take 1 tablet (4 mg total) by mouth at bedtime.   escitalopram 5 MG tablet Commonly known as: LEXAPRO Take 5 mg by mouth daily.   Farxiga 10 MG Tabs tablet Generic drug: dapagliflozin propanediol Take 1 tablet (10 mg total) by mouth daily.   ferrous sulfate 325 (65 FE) MG tablet Take 1 tablet (325 mg total) by mouth daily with breakfast.   furosemide 40 MG tablet Commonly known as: LASIX Take 1 tablet (40 mg total) by mouth in the [name].   metFORMIN 500 MG tablet Commonly known as: GLUCOPHAGE Take 1 tablet (500 mg total) by mouth 2 (two) times daily with a meal.   pantoprazole 40 MG tablet Commonly known as: PROTONIX Take 1 tablet (40 mg total) by mouth daily.   potassium chloride 10 MEQ tablet Commonly known as: KLOR-CON Take 2 tablets (20 mEq total) by mouth daily. Take while Taking Laisx/Furosemide   vitamin B-12 1000 MCG tablet Commonly known as: CYANOCOBALAMIN Take 1,000 mcg by mouth 2 (two) times daily.         Follow-up Information     Manuel Morning, DO. Schedule an appointment as soon as possible for a visit in 1 week(s).   Specialty: Family Medicine Why: With repeat CBC/BMP Contact information: [place] [address] [phone]         Carol Ada, MD Follow up.    Specialty: Gastroenterology Contact information: [address] [place] [address] [phone]                Allergies  Allergen Reactions   Sulfa Antibiotics Shortness Of Breath    Headaches    Colchicine     Other reaction(s): diarrhea   Labetalol Hcl     Other reaction(s): dysuria   Tramadol Hcl     Other reaction(s): nausea    Consultations: GI   Procedures/Studies: DG Chest 1 View  Result Date: 11/11/2021 CLINICAL DATA:  Right pleural effusion. EXAM: CHEST  1 VIEW COMPARISON:  AP chest 11/10/2021 FINDINGS: Cardiac silhouette again appears mildly enlarged. Mediastinal contours are within normal limits. Interval decrease in right-sided pleural effusion following interval thoracentesis. Small residual right pleural effusion. Right-greater-than-left basilar atelectasis. No pneumothorax. No acute skeletal abnormality. IMPRESSION: Marked improvement in the prior right pleural effusion, now minimal following interval thoracentesis. Electronically Signed   By: Yvonne Kendall M.D.   On: 11/11/2021 09:55   DG Chest 2 View  Result Date: 11/10/2021 CLINICAL DATA:  Shortness of breath EXAM: CHEST - 2 VIEW COMPARISON:  09/30/2021 FINDINGS: Moderate right pleural effusion  with right lower lobe airspace disease concerning for pneumonia. Heart is mildly enlarged. Left lung clear. No acute bony abnormality. IMPRESSION: Right lower lobe consolidation with right effusion. Findings concerning for pneumonia. Electronically Signed   By: Rolm Baptise M.D.   On: 11/10/2021 20:27   ECHOCARDIOGRAM COMPLETE  Result Date: 11/12/2021    ECHOCARDIOGRAM REPORT   Patient Name:   Manuel [name] Date of Exam: 11/12/2021 Medical Rec #:  [id number]       Height:       75.0 in Accession #:    [id number]      Weight:       179.2 lb Date of Birth:  Feb 19, 1946       BSA:          2.094 m Patient Age:    30 years        BP:           109/86 mmHg Patient Gender: M               HR:            83 bpm. Exam Location:  Inpatient Procedure: 2D Echo, 3D Echo, Cardiac Doppler and Color Doppler Indications:    CHF-Acute Diastolic T02.11  History:        Patient has prior history of Echocardiogram examinations, most                 recent 04/28/2021. COPD, Arrythmias:Atrial Fibrillation; Risk                 Factors:Hypertension, Diabetes, Dyslipidemia and Former Smoker.                 Community acquired pneumonia. Chronic kidney disease.  Sonographer:    Darlina Sicilian RDCS Referring Phys: [id number] [place]  1. Left ventricular diastolic function could not be evaluated due to atrial fibrillation, but left ventricular filling pressures likely elevated based on tissue doppler data. . Left ventricular ejection fraction, by estimation, is 55 to 60%. The left ventricle has normal function. The left ventricle has no regional wall motion abnormalities. There is mild left ventricular hypertrophy.  2. Right ventricular systolic function is low normal. The right ventricular size is mildly enlarged. There is moderately elevated pulmonary artery systolic pressure.  3. Right atrial size was mild to moderately dilated.  4. The mitral valve is grossly normal. Mild mitral valve regurgitation. No evidence of mitral stenosis.  5. Tricuspid valve regurgitation is moderate.  6. The aortic valve is tricuspid. Aortic valve regurgitation is mild. Aortic valve sclerosis is present, with no evidence of aortic valve stenosis.  7. The inferior vena cava is dilated in size with <50% respiratory variability, suggesting right atrial pressure of 15 mmHg. Comparison(s): A prior study was performed on 07/05/2021. Prior study 07/05/2021: LVEF 60-65%, elevated LAP, increased RAP, RV size visually moderate to severely dilated, severe TR, RVSP 30mHG, insignificant pericardial effusion. FINDINGS  Left Ventricle: Left ventricular diastolic function could not be evaluated due to atrial fibrillation, but left ventricular filling  pressures likely elevated based on tissue doppler data. Left ventricular ejection fraction, by estimation, is 55 to 60%. The left ventricle has normal function. The left ventricle has no regional wall motion abnormalities. The left ventricular internal cavity size was normal in size. There is mild left ventricular hypertrophy. Left ventricular diastolic function could not be evaluated due to atrial fibrillation. Right Ventricle: The right ventricular size is mildly enlarged. No increase in right ventricular  wall thickness. Right ventricular systolic function is low normal. There is moderately elevated pulmonary artery systolic pressure. The tricuspid regurgitant  velocity is 2.91 m/s, and with an assumed right atrial pressure of 15 mmHg, the estimated right ventricular systolic pressure is 56.4 mmHg. Left Atrium: Left atrial size was normal in size. Right Atrium: Right atrial size was mild to moderately dilated. Pericardium: There is no evidence of pericardial effusion. Mitral Valve: The mitral valve is grossly normal. Mild mitral valve regurgitation. No evidence of mitral valve stenosis. Tricuspid Valve: The tricuspid valve is grossly normal. Tricuspid valve regurgitation is moderate . No evidence of tricuspid stenosis. Aortic Valve: The aortic valve is tricuspid. Aortic valve regurgitation is mild. Aortic valve sclerosis is present, with no evidence of aortic valve stenosis. Pulmonic Valve: The pulmonic valve was grossly normal. Pulmonic valve regurgitation is trivial. No evidence of pulmonic stenosis. Aorta: The aortic root and ascending aorta are structurally normal, with no evidence of dilitation. Venous: The inferior vena cava is dilated in size with less than 50% respiratory variability, suggesting right atrial pressure of 15 mmHg. IAS/Shunts: There is left bowing of the interatrial septum, suggestive of elevated right atrial pressure. The atrial septum is grossly normal.  LEFT VENTRICLE PLAX 2D LVIDd:          4.10 cm   Diastology LVIDs:         2.80 cm   LV e' medial:    10.15 cm/s LV PW:         1.30 cm   LV E/e' medial:  11.9 LV IVS:        1.10 cm   LV e' lateral:   13.75 cm/s LVOT diam:     1.80 cm   LV E/e' lateral: 8.8 LV SV:         45 LV SV Index:   22 LVOT Area:     2.54 cm                           3D Volume EF:                          3D EF:        56 %                          LV EDV:       79 ml                          LV ESV:       34 ml                          LV SV:        44 ml RIGHT VENTRICLE RV Basal diam:  5.30 cm RV Mid diam:    3.10 cm RV S prime:     14.40 cm/s TAPSE (M-mode): 1.7 cm LEFT ATRIUM             Index        RIGHT ATRIUM           Index LA diam:        4.80 cm 2.29 cm/m   RA Area:     25.60 cm LA Vol (A2C):   64.7 ml 30.89 ml/m  RA Volume:   73.40  ml  35.04 ml/m LA Vol (A4C):   61.1 ml 29.17 ml/m LA Biplane Vol: 68.0 ml 32.47 ml/m  AORTIC VALVE LVOT Vmax:   112.00 cm/s LVOT Vmean:  70.700 cm/s LVOT VTI:    0.177 m  AORTA Ao Root diam: 3.30 cm Ao Asc diam:  3.40 cm MITRAL VALVE                TRICUSPID VALVE MV Area (PHT):  5.36 cm    TR Peak grad:   33.9 mmHg MV Area (plan): 5.60 cm    TR Vmax:        291.00 cm/s MV Decel Time:  142 msec MV E velocity: 121.00 cm/s  SHUNTS                             Systemic VTI:  0.18 m                             Systemic Diam: 1.80 cm Sunit Tolia DO Electronically signed by Rex Kras DO Signature Date/Time: 11/12/2021/6:17:22 PM    Final    US THORACENTESIS ASP PLEURAL SPACE W/IMG GUIDE  Result Date: 11/11/2021 INDICATION: Shortness of breath. Right-sided pleural effusion. Request for diagnostic and therapeutic thoracentesis. EXAM: ULTRASOUND GUIDED RIGHT THORACENTESIS MEDICATIONS: 1% plain lidocaine, 5 mL COMPLICATIONS: None immediate. PROCEDURE: An ultrasound guided thoracentesis was thoroughly discussed with the patient and questions answered. The benefits, risks, alternatives and complications were also discussed. The patient  understands and wishes to proceed with the procedure. Written consent was obtained. Ultrasound was performed to localize and mark an adequate pocket of fluid in the right chest. The area was then prepped and draped in the normal sterile fashion. 1% Lidocaine was used for local anesthesia. Under ultrasound guidance a 6 Fr Safe-T-Centesis catheter was introduced. Thoracentesis was performed. The catheter was removed and a dressing applied. FINDINGS: A total of approximately 1.6 L of thin, serosanguineous fluid was removed. Samples were sent to the laboratory as requested by the clinical team. IMPRESSION: Successful ultrasound guided right thoracentesis yielding 1.6 L of pleural fluid. Read by: Ascencion Dike PA-C Electronically Signed   By: Corrie Mckusick D.O.   On: 11/11/2021 10:34    EGD and colonoscopy as above  Subjective: Patient seen and examined at bedside.  Patient feels slightly better today and tolerating diet.  Feels okay to go home today.  No overnight fever, vomiting, black stools or bloody stools reported.  Discharge Exam: Vitals:   12/08/21 0923 12/08/21 1134  BP:    Pulse:    Resp: (!) 25   Temp:    SpO2: 96% 93%    General: Pt is alert, awake, not in acute distress.  Still slow to respond.  On 3 to 4 L oxygen via nasal cannula.  Looks chronically ill and deconditioned. Cardiovascular: Intermittently tachycardic, S1/S2 + Respiratory: bilateral decreased breath sounds at bases with some scattered crackles Abdominal: Soft, NT, ND, bowel sounds + Extremities: Trace lower extremity edema; no cyanosis    The results of significant diagnostics from this hospitalization (including imaging, microbiology, ancillary and laboratory) are listed below for reference.     Microbiology: No results found for this or any previous visit (from the past 240 hour(s)).   Labs: BNP (last 3 results) Recent Labs    09/28/21 1442 10/01/21 0433 11/10/21 1915  BNP 629.0* 325.0* 178.8*   Basic  Metabolic  Panel: Recent Labs  Lab 12/04/21 1118 12/05/21 0504 12/06/21 0132 12/07/21 0508 12/08/21 0501  NA 142 144 142 143 139  K 4.4 4.1 3.7 4.6 4.4  CL 101 102 102 100 98  CO2 35* 37* 36* 40* 39*  GLUCOSE 125* 93 81 152* 158*  BUN 28* _0 CREATININE 1.26* 1.09 1.17 1.16 1.10  CALCIUM 9.0 8.4* 8.3* 8.5* 8.7*  MG  --   --  1.8 1.9 1.9  PHOS  --   --  3.1  --   --    Liver Function Tests: Recent Labs  Lab 12/04/21 1118 12/05/21 0504 12/06/21 0132  AST 10* 10*  --   ALT 7 9  --   ALKPHOS 57 49  --   BILITOT 0.8 0.9  --   PROT 6.0* 5.0*  --   ALBUMIN 3.5 2.5* 2.5*   No results for input(s): LIPASE, AMYLASE in the last 168 hours. No results for input(s): AMMONIA in the last 168 hours. CBC: Recent Labs  Lab 12/04/21 1118 12/04/21 1757 12/05/21 0504 12/05/21 1748 12/06/21 0132 12/06/21 0912 12/07/21 0508 12/08/21 0501  WBC 3.8*  --  2.9*  --  2.3*  --  4.0 3.4*  NEUTROABS 3.3  --   --   --   --   --  3.1 2.7  HGB 10.0*   < > 8.8* 8.8* 8.8* 8.7* 9.1* 8.5*  HCT 33.0*   < > 29.6* 28.9* 29.8* 29.4* 30.3* 29.1*  MCV 98.8  --  103.5*  --  102.8*  --  105.6* 106.2*  PLT 157  --  155  --  126*  --  138* 137*   < > = values in this interval not displayed.   Cardiac Enzymes: No results for input(s): CKTOTAL, CKMB, CKMBINDEX, TROPONINI in the last 168 hours. BNP: Invalid input(s): POCBNP CBG: Recent Labs  Lab 12/07/21 0725 12/07/21 1132 12/07/21 1703 12/07/21 2110 12/08/21 0734  GLUCAP 148* 105* 134* 166* 147*   D-Dimer No results for input(s): DDIMER in the last 72 hours. Hgb A1c No results for input(s): HGBA1C in the last 72 hours. Lipid Profile No results for input(s): CHOL, HDL, LDLCALC, TRIG, CHOLHDL, LDLDIRECT in the last 72 hours. Thyroid function studies No results for input(s): TSH, T4TOTAL, T3FREE, THYROIDAB in the last 72 hours.  Invalid input(s): FREET3 Anemia work up Recent Labs    12/06/21 0132  VITAMINB12 2,414*  FOLATE 10.6   FERRITIN 103  TIBC 199*  IRON 38*  RETICCTPCT 2.4   Urinalysis    Component Value Date/Time   COLORURINE YELLOW 09/28/2021 1935   APPEARANCEUR CLEAR 09/28/2021 1935   LABSPEC 1.015 09/28/2021 1935   PHURINE 5.0 09/28/2021 1935   GLUCOSEU NEGATIVE 09/28/2021 1935   HGBUR NEGATIVE 09/28/2021 [place] NEGATIVE 09/28/2021 [place] NEGATIVE 09/28/2021 1935   PROTEINUR 100 (A) 09/28/2021 1935   NITRITE NEGATIVE 09/28/2021 1935   LEUKOCYTESUR NEGATIVE 09/28/2021 1935   Sepsis Labs Invalid input(s): PROCALCITONIN,  WBC,  LACTICIDVEN Microbiology No results found for this or any previous visit (from the past 240 hour(s)).   Time coordinating discharge: 35 minutes  SIGNED:   Aline August, MD  [facility] 12/09/2021, 9:56 AM

## 2021-12-08 NOTE — Progress Notes (Signed)
Per tele, patient had 15 beat run VT. Upon assessment patient sleeping. Vitals as follows. No complaints. MD aware.    12/08/21 1528  Vitals  Temp 98.7 F (37.1 C)  Temp Source Oral  BP 107/69  BP Location Left Arm  BP Method Automatic  Patient Position (if appropriate) Lying  Pulse Rate (!) 110  Resp (!) 24  Level of Consciousness  Level of Consciousness Alert  MEWS COLOR  MEWS Score Color Yellow  Oxygen Therapy  SpO2 94 %  O2 Device Nasal Cannula  O2 Flow Rate (L/min) 4 L/min  MEWS Score  MEWS Temp 0  MEWS Systolic 0  MEWS Pulse 1  MEWS RR 1  MEWS LOC 0  MEWS Score 2  Provider Notification  Provider Name/Title Kshitiz  Date Provider Notified 12/08/21  Time Provider Notified [id number]  Method of Notification Page  Notification Reason Change in status (15 beat run VT)  Provider response No new orders  Date of Provider Response 12/08/21  Time of Provider Response 1530

## 2021-12-08 NOTE — TOC Progression Note (Addendum)
Transition of Care [facility]) - Progression Note    Patient Details  Name: ABBIE JABLON MRN: [id number] Date of Birth: 1945/08/16  [facility]) CM/SW Contact  Abiageal Blowe, Juliann Pulse, RN Phone Number: 12/08/2021, 1:43 PM  Clinical Esaw Dace for home 02-rep Caryl Pina will discuss w/patient/spouse about portables;active w/[facility]-ot[facility] palliative.     Expected Discharge Plan: Home/Self Care Barriers to Discharge: Continued Medical Work up  Expected Discharge Plan and Services Expected Discharge Plan: Home/Self Care   Discharge Planning Services: CM Consult   Living arrangements for the past 2 months: Single Family Home                                       Social Determinants of Health (SDOH) Interventions    Readmission Risk Interventions    12/08/2021   10:42 AM  Readmission Risk Prevention Plan  Transportation Screening Complete  PCP or Specialist Appt within 3-5 Days Complete  HRI or [place] Complete  Social Work Consult for [place] Planning/Counseling Complete  Palliative Care Screening Complete  Medication Review [employer]) Complete

## 2021-12-09 DIAGNOSIS — J9611 Chronic respiratory failure with hypoxia: Secondary | ICD-10-CM | POA: Diagnosis not present

## 2021-12-09 DIAGNOSIS — K922 Gastrointestinal hemorrhage, unspecified: Secondary | ICD-10-CM | POA: Diagnosis not present

## 2021-12-09 DIAGNOSIS — D62 Acute posthemorrhagic anemia: Secondary | ICD-10-CM | POA: Diagnosis not present

## 2021-12-09 DIAGNOSIS — I5032 Chronic diastolic (congestive) heart failure: Secondary | ICD-10-CM | POA: Diagnosis not present

## 2021-12-09 DIAGNOSIS — I4821 Permanent atrial fibrillation: Secondary | ICD-10-CM | POA: Diagnosis not present

## 2021-12-09 LAB — CBC WITH DIFFERENTIAL/PLATELET
Abs Immature Granulocytes: 0.01 10*3/uL (ref 0.00–0.07)
Basophils Absolute: 0 10*3/uL (ref 0.0–0.1)
Basophils Relative: 0 %
Eosinophils Absolute: 0 10*3/uL (ref 0.0–0.5)
Eosinophils Relative: 1 %
HCT: 28 % — ABNORMAL LOW (ref 39.0–52.0)
Hemoglobin: 8.4 g/dL — ABNORMAL LOW (ref 13.0–17.0)
Immature Granulocytes: 0 %
Lymphocytes Relative: 11 %
Lymphs Abs: 0.3 10*3/uL — ABNORMAL LOW (ref 0.7–4.0)
MCH: 31.6 pg (ref 26.0–34.0)
MCHC: 30 g/dL (ref 30.0–36.0)
MCV: 105.3 fL — ABNORMAL HIGH (ref 80.0–100.0)
Monocytes Absolute: 0.3 10*3/uL (ref 0.1–1.0)
Monocytes Relative: 11 %
Neutro Abs: 2.3 10*3/uL (ref 1.7–7.7)
Neutrophils Relative %: 77 %
Platelets: 139 10*3/uL — ABNORMAL LOW (ref 150–400)
RBC: 2.66 MIL/uL — ABNORMAL LOW (ref 4.22–5.81)
RDW: 15.4 % (ref 11.5–15.5)
WBC: 3 10*3/uL — ABNORMAL LOW (ref 4.0–10.5)
nRBC: 0 % (ref 0.0–0.2)

## 2021-12-09 LAB — BASIC METABOLIC PANEL
Anion gap: 4 — ABNORMAL LOW (ref 5–15)
BUN: 11 mg/dL (ref 8–23)
CO2: 41 mmol/L — ABNORMAL HIGH (ref 22–32)
Calcium: 8.8 mg/dL — ABNORMAL LOW (ref 8.9–10.3)
Chloride: 97 mmol/L — ABNORMAL LOW (ref 98–111)
Creatinine, Ser: 1.1 mg/dL (ref 0.61–1.24)
GFR, Estimated: >60 mL/min (ref >60)
Glucose, Bld: 114 mg/dL — ABNORMAL HIGH (ref 70–99)
Potassium: 4.2 mmol/L (ref 3.5–5.1)
Sodium: 142 mmol/L (ref 135–145)

## 2021-12-09 LAB — GLUCOSE, CAPILLARY: Glucose-Capillary: 162 mg/dL — ABNORMAL HIGH (ref 70–99)

## 2021-12-09 LAB — MAGNESIUM: Magnesium: 1.8 mg/dL (ref 1.7–2.4)

## 2021-12-09 MED ORDER — FUROSEMIDE 40 MG PO TABS: 40.0000 mg | ORAL_TABLET | Freq: Every morning | ORAL | Status: AC

## 2021-12-09 NOTE — TOC Transition Note (Signed)
Transition of Care [facility]) - CM/SW Discharge Note   Patient Details  Name: JERUSALEM WERT MRN: [id number] Date of Birth: 10-05-1945  Transition of Care [facility]) CM/SW Contact:  Ross Ludwig, LCSW Phone Number: 12/09/2021, 10:10 AM   Clinical Narrative:     Patient will be going home. CSW signing off please reconsult with any other social work needs.    Final next level of care: Home/Self Care Barriers to Discharge: Barriers Resolved   Patient Goals and CMS Choice Patient states their goals for this hospitalization and ongoing recovery are:: To return back home. CMS Medicare.gov Compare Post Acute Care list provided to:: Patient Choice offered to / list presented to : Patient  Discharge Placement                       Discharge Plan and Services   Discharge Planning Services: CM Consult                                 Social Determinants of Health (SDOH) Interventions     Readmission Risk Interventions    12/08/2021   10:42 AM  Readmission Risk Prevention Plan  Transportation Screening Complete  PCP or Specialist Appt within 3-5 Days Complete  HRI or [place] Complete  Social Work Consult for [place] Planning/Counseling Complete  Palliative Care Screening Complete  Medication Review [employer] photographer) Complete

## 2021-12-11 ENCOUNTER — Ambulatory Visit: Payer: Medicare HMO | Admitting: Student

## 2021-12-21 DEATH — deceased

## 2021-12-28 ENCOUNTER — Other Ambulatory Visit: Payer: Medicare HMO | Admitting: Family Medicine

## 2022-01-19 ENCOUNTER — Ambulatory Visit: Payer: Medicare HMO | Admitting: Pulmonary Disease

## 2022-02-14 ENCOUNTER — Ambulatory Visit: Payer: Medicare HMO | Admitting: Nurse Practitioner

## 2022-02-14 ENCOUNTER — Other Ambulatory Visit: Payer: Medicare HMO

## 2022-09-10 ENCOUNTER — Ambulatory Visit: Payer: Medicare HMO | Admitting: Adult Health

## 2022-09-12 ENCOUNTER — Ambulatory Visit: Payer: Medicare HMO | Admitting: Adult Health
